# Patient Record
Sex: Female | Born: 1935 | Race: White | Hispanic: No | Smoking: Never smoker
Health system: Southern US, Community
[De-identification: ages and names within clinical notes are randomized; demographics above are authoritative.]

## PROBLEM LIST (undated history)

## (undated) DIAGNOSIS — E871 Hypo-osmolality and hyponatremia: Secondary | ICD-10-CM

## (undated) DIAGNOSIS — F418 Other specified anxiety disorders: Secondary | ICD-10-CM

## (undated) DIAGNOSIS — Z9981 Dependence on supplemental oxygen: Secondary | ICD-10-CM

## (undated) DIAGNOSIS — Z7189 Other specified counseling: Secondary | ICD-10-CM

## (undated) DIAGNOSIS — F419 Anxiety disorder, unspecified: Secondary | ICD-10-CM

## (undated) DIAGNOSIS — R4182 Altered mental status, unspecified: Secondary | ICD-10-CM

## (undated) DIAGNOSIS — J90 Pleural effusion, not elsewhere classified: Secondary | ICD-10-CM

## (undated) DIAGNOSIS — J9690 Respiratory failure, unspecified, unspecified whether with hypoxia or hypercapnia: Secondary | ICD-10-CM

## (undated) DIAGNOSIS — I340 Nonrheumatic mitral (valve) insufficiency: Secondary | ICD-10-CM

## (undated) DIAGNOSIS — E039 Hypothyroidism, unspecified: Secondary | ICD-10-CM

## (undated) DIAGNOSIS — I4819 Other persistent atrial fibrillation: Secondary | ICD-10-CM

## (undated) DIAGNOSIS — I10 Essential (primary) hypertension: Secondary | ICD-10-CM

## (undated) DIAGNOSIS — Z952 Presence of prosthetic heart valve: Secondary | ICD-10-CM

## (undated) DIAGNOSIS — D519 Vitamin B12 deficiency anemia, unspecified: Secondary | ICD-10-CM

## (undated) DIAGNOSIS — Z515 Encounter for palliative care: Secondary | ICD-10-CM

## (undated) DIAGNOSIS — E8770 Fluid overload, unspecified: Secondary | ICD-10-CM

## (undated) DIAGNOSIS — I5042 Chronic combined systolic (congestive) and diastolic (congestive) heart failure: Secondary | ICD-10-CM

## (undated) DIAGNOSIS — R627 Adult failure to thrive: Secondary | ICD-10-CM

## (undated) DIAGNOSIS — N183 Chronic kidney disease, stage 3 unspecified: Secondary | ICD-10-CM

## (undated) DIAGNOSIS — M1990 Unspecified osteoarthritis, unspecified site: Secondary | ICD-10-CM

## (undated) DIAGNOSIS — E079 Disorder of thyroid, unspecified: Secondary | ICD-10-CM

## (undated) DIAGNOSIS — I509 Heart failure, unspecified: Secondary | ICD-10-CM

## (undated) HISTORY — DX: Hypothyroidism, unspecified: E03.9

## (undated) HISTORY — DX: Pleural effusion, not elsewhere classified: J90

## (undated) HISTORY — PX: AORTIC VALVE REPLACEMENT: SHX41

## (undated) HISTORY — DX: Fluid overload, unspecified: E87.70

## (undated) HISTORY — DX: Presence of prosthetic heart valve: Z95.2

## (undated) HISTORY — DX: Hypo-osmolality and hyponatremia: E87.1

## (undated) HISTORY — DX: Nonrheumatic mitral (valve) insufficiency: I34.0

## (undated) HISTORY — DX: Chronic combined systolic (congestive) and diastolic (congestive) heart failure: I50.42

## (undated) HISTORY — DX: Respiratory failure, unspecified, unspecified whether with hypoxia or hypercapnia: J96.90

## (undated) HISTORY — DX: Chronic kidney disease, stage 3 unspecified: N18.30

## (undated) HISTORY — DX: Vitamin B12 deficiency anemia, unspecified: D51.9

## (undated) HISTORY — DX: Essential (primary) hypertension: I10

## (undated) HISTORY — PX: ABDOMINAL HYSTERECTOMY: SHX81

## (undated) HISTORY — DX: Heart failure, unspecified: I50.9

## (undated) HISTORY — DX: Other persistent atrial fibrillation: I48.19

## (undated) HISTORY — DX: Adult failure to thrive: R62.7

## (undated) HISTORY — DX: Other specified counseling: Z71.89

## (undated) HISTORY — DX: Dependence on supplemental oxygen: Z99.81

## (undated) HISTORY — DX: Unspecified osteoarthritis, unspecified site: M19.90

## (undated) HISTORY — DX: Other specified anxiety disorders: F41.8

## (undated) HISTORY — DX: Altered mental status, unspecified: R41.82

## (undated) HISTORY — DX: Encounter for palliative care: Z51.5

## (undated) HISTORY — DX: Anxiety disorder, unspecified: F41.9

---

## 2018-11-27 DIAGNOSIS — I7100 Dissection of unspecified site of aorta: Secondary | ICD-10-CM

## 2018-11-27 HISTORY — DX: Dissection of unspecified site of aorta: I71.00

## 2018-12-20 ENCOUNTER — Other Ambulatory Visit: Payer: Self-pay

## 2018-12-20 ENCOUNTER — Inpatient Hospital Stay (HOSPITAL_COMMUNITY)
Admission: EM | Admit: 2018-12-20 | Discharge: 2019-01-17 | DRG: 219 | Disposition: A | Discharge status: Home Health | Payer: Medicare Other | Attending: Cardiothoracic Surgery | Admitting: Cardiothoracic Surgery

## 2018-12-20 ENCOUNTER — Emergency Department (HOSPITAL_COMMUNITY): Payer: Medicare Other

## 2018-12-20 ENCOUNTER — Ambulatory Visit (HOSPITAL_COMMUNITY): Admit: 2018-12-20 | Payer: Self-pay | Admitting: Cardiovascular Disease

## 2018-12-20 ENCOUNTER — Encounter (HOSPITAL_COMMUNITY)
Admission: EM | Disposition: A | Discharge status: Home Health | Payer: Self-pay | Source: Home / Self Care | Attending: Cardiothoracic Surgery

## 2018-12-20 ENCOUNTER — Encounter (HOSPITAL_COMMUNITY): Payer: Self-pay

## 2018-12-20 ENCOUNTER — Emergency Department (HOSPITAL_COMMUNITY): Payer: Medicare Other | Admitting: Anesthesiology

## 2018-12-20 DIAGNOSIS — E873 Alkalosis: Secondary | ICD-10-CM | POA: Diagnosis not present

## 2018-12-20 DIAGNOSIS — E876 Hypokalemia: Secondary | ICD-10-CM | POA: Diagnosis not present

## 2018-12-20 DIAGNOSIS — R0602 Shortness of breath: Secondary | ICD-10-CM

## 2018-12-20 DIAGNOSIS — I7101 Dissection of ascending aorta: Secondary | ICD-10-CM | POA: Diagnosis present

## 2018-12-20 DIAGNOSIS — R6510 Systemic inflammatory response syndrome (SIRS) of non-infectious origin without acute organ dysfunction: Secondary | ICD-10-CM | POA: Diagnosis present

## 2018-12-20 DIAGNOSIS — I313 Pericardial effusion (noninflammatory): Secondary | ICD-10-CM | POA: Diagnosis present

## 2018-12-20 DIAGNOSIS — Z20828 Contact with and (suspected) exposure to other viral communicable diseases: Secondary | ICD-10-CM | POA: Diagnosis present

## 2018-12-20 DIAGNOSIS — D72829 Elevated white blood cell count, unspecified: Secondary | ICD-10-CM | POA: Diagnosis present

## 2018-12-20 DIAGNOSIS — R4182 Altered mental status, unspecified: Secondary | ICD-10-CM

## 2018-12-20 DIAGNOSIS — E039 Hypothyroidism, unspecified: Secondary | ICD-10-CM | POA: Diagnosis present

## 2018-12-20 DIAGNOSIS — E871 Hypo-osmolality and hyponatremia: Secondary | ICD-10-CM

## 2018-12-20 DIAGNOSIS — J9601 Acute respiratory failure with hypoxia: Secondary | ICD-10-CM | POA: Diagnosis not present

## 2018-12-20 DIAGNOSIS — Z515 Encounter for palliative care: Secondary | ICD-10-CM

## 2018-12-20 DIAGNOSIS — Z7189 Other specified counseling: Secondary | ICD-10-CM

## 2018-12-20 DIAGNOSIS — I083 Combined rheumatic disorders of mitral, aortic and tricuspid valves: Secondary | ICD-10-CM | POA: Diagnosis present

## 2018-12-20 DIAGNOSIS — I4819 Other persistent atrial fibrillation: Secondary | ICD-10-CM

## 2018-12-20 DIAGNOSIS — F419 Anxiety disorder, unspecified: Secondary | ICD-10-CM | POA: Diagnosis present

## 2018-12-20 DIAGNOSIS — Z8679 Personal history of other diseases of the circulatory system: Secondary | ICD-10-CM

## 2018-12-20 DIAGNOSIS — T502X5A Adverse effect of carbonic-anhydrase inhibitors, benzothiadiazides and other diuretics, initial encounter: Secondary | ICD-10-CM | POA: Diagnosis not present

## 2018-12-20 DIAGNOSIS — I7100 Dissection of unspecified site of aorta: Secondary | ICD-10-CM

## 2018-12-20 DIAGNOSIS — E8770 Fluid overload, unspecified: Secondary | ICD-10-CM

## 2018-12-20 DIAGNOSIS — T462X5A Adverse effect of other antidysrhythmic drugs, initial encounter: Secondary | ICD-10-CM | POA: Diagnosis not present

## 2018-12-20 DIAGNOSIS — K5900 Constipation, unspecified: Secondary | ICD-10-CM | POA: Diagnosis not present

## 2018-12-20 DIAGNOSIS — F329 Major depressive disorder, single episode, unspecified: Secondary | ICD-10-CM | POA: Diagnosis present

## 2018-12-20 DIAGNOSIS — Z66 Do not resuscitate: Secondary | ICD-10-CM | POA: Diagnosis not present

## 2018-12-20 DIAGNOSIS — G4733 Obstructive sleep apnea (adult) (pediatric): Secondary | ICD-10-CM | POA: Diagnosis present

## 2018-12-20 DIAGNOSIS — Z6838 Body mass index (BMI) 38.0-38.9, adult: Secondary | ICD-10-CM

## 2018-12-20 DIAGNOSIS — Z90710 Acquired absence of both cervix and uterus: Secondary | ICD-10-CM

## 2018-12-20 DIAGNOSIS — F05 Delirium due to known physiological condition: Secondary | ICD-10-CM | POA: Diagnosis not present

## 2018-12-20 DIAGNOSIS — I959 Hypotension, unspecified: Secondary | ICD-10-CM | POA: Diagnosis not present

## 2018-12-20 DIAGNOSIS — I4891 Unspecified atrial fibrillation: Secondary | ICD-10-CM | POA: Diagnosis present

## 2018-12-20 DIAGNOSIS — I314 Cardiac tamponade: Secondary | ICD-10-CM | POA: Diagnosis present

## 2018-12-20 DIAGNOSIS — J9602 Acute respiratory failure with hypercapnia: Secondary | ICD-10-CM | POA: Diagnosis not present

## 2018-12-20 DIAGNOSIS — Z8249 Family history of ischemic heart disease and other diseases of the circulatory system: Secondary | ICD-10-CM

## 2018-12-20 DIAGNOSIS — I454 Nonspecific intraventricular block: Secondary | ICD-10-CM | POA: Diagnosis present

## 2018-12-20 DIAGNOSIS — Z79890 Hormone replacement therapy (postmenopausal): Secondary | ICD-10-CM

## 2018-12-20 DIAGNOSIS — G4700 Insomnia, unspecified: Secondary | ICD-10-CM | POA: Diagnosis present

## 2018-12-20 DIAGNOSIS — D62 Acute posthemorrhagic anemia: Secondary | ICD-10-CM | POA: Diagnosis not present

## 2018-12-20 DIAGNOSIS — R946 Abnormal results of thyroid function studies: Secondary | ICD-10-CM | POA: Diagnosis not present

## 2018-12-20 DIAGNOSIS — J9690 Respiratory failure, unspecified, unspecified whether with hypoxia or hypercapnia: Secondary | ICD-10-CM

## 2018-12-20 DIAGNOSIS — J90 Pleural effusion, not elsewhere classified: Secondary | ICD-10-CM

## 2018-12-20 DIAGNOSIS — I71019 Dissection of thoracic aorta, unspecified: Secondary | ICD-10-CM

## 2018-12-20 DIAGNOSIS — E11649 Type 2 diabetes mellitus with hypoglycemia without coma: Secondary | ICD-10-CM | POA: Diagnosis not present

## 2018-12-20 DIAGNOSIS — Z9889 Other specified postprocedural states: Secondary | ICD-10-CM

## 2018-12-20 HISTORY — DX: Disorder of thyroid, unspecified: E07.9

## 2018-12-20 HISTORY — PX: STERNAL CLOSURE: SHX6203

## 2018-12-20 HISTORY — PX: REPLACEMENT ASCENDING AORTA: SHX6068

## 2018-12-20 LAB — BASIC METABOLIC PANEL
Anion gap: 17 — ABNORMAL HIGH (ref 5–15)
BUN: 13 mg/dL (ref 8–23)
CO2: 17 mmol/L — ABNORMAL LOW (ref 22–32)
Calcium: 9.2 mg/dL (ref 8.9–10.3)
Chloride: 99 mmol/L (ref 98–111)
Creatinine, Ser: 1.04 mg/dL — ABNORMAL HIGH (ref 0.44–1.00)
GFR calc Af Amer: 58 mL/min — ABNORMAL LOW (ref >60)
GFR calc non Af Amer: 50 mL/min — ABNORMAL LOW (ref >60)
Glucose, Bld: 288 mg/dL — ABNORMAL HIGH (ref 70–99)
Potassium: 4.2 mmol/L (ref 3.5–5.1)
Sodium: 133 mmol/L — ABNORMAL LOW (ref 135–145)

## 2018-12-20 LAB — CBC
HCT: 30.8 % — ABNORMAL LOW (ref 36.0–46.0)
Hemoglobin: 8.8 g/dL — ABNORMAL LOW (ref 12.0–15.0)
MCH: 21.5 pg — ABNORMAL LOW (ref 26.0–34.0)
MCHC: 28.6 g/dL — ABNORMAL LOW (ref 30.0–36.0)
MCV: 75.1 fL — ABNORMAL LOW (ref 80.0–100.0)
Platelets: 479 10*3/uL — ABNORMAL HIGH (ref 150–400)
RBC: 4.1 MIL/uL (ref 3.87–5.11)
RDW: 19.8 % — ABNORMAL HIGH (ref 11.5–15.5)
WBC: 13.2 10*3/uL — ABNORMAL HIGH (ref 4.0–10.5)
nRBC: 0 % (ref 0.0–0.2)

## 2018-12-20 LAB — I-STAT CHEM 8, ED
BUN: 15 mg/dL (ref 8–23)
Calcium, Ion: 1.27 mmol/L (ref 1.15–1.40)
Chloride: 99 mmol/L (ref 98–111)
Creatinine, Ser: 1 mg/dL (ref 0.44–1.00)
Glucose, Bld: 260 mg/dL — ABNORMAL HIGH (ref 70–99)
HCT: 28 % — ABNORMAL LOW (ref 36.0–46.0)
Hemoglobin: 9.5 g/dL — ABNORMAL LOW (ref 12.0–15.0)
Potassium: 4.4 mmol/L (ref 3.5–5.1)
Sodium: 131 mmol/L — ABNORMAL LOW (ref 135–145)
TCO2: 21 mmol/L — ABNORMAL LOW (ref 22–32)

## 2018-12-20 LAB — TROPONIN I (HIGH SENSITIVITY)
Troponin I (High Sensitivity): 518 ng/L (ref <18)
Troponin I (High Sensitivity): 492 ng/L (ref <18)

## 2018-12-20 LAB — BRAIN NATRIURETIC PEPTIDE: B Natriuretic Peptide: 233.8 pg/mL — ABNORMAL HIGH (ref 0.0–100.0)

## 2018-12-20 LAB — TSH: TSH: 1.293 u[IU]/mL (ref 0.350–4.500)

## 2018-12-20 LAB — PREPARE RBC (CROSSMATCH)

## 2018-12-20 LAB — PROTIME-INR
INR: 1.3 — ABNORMAL HIGH (ref 0.8–1.2)
Prothrombin Time: 16.2 seconds — ABNORMAL HIGH (ref 11.4–15.2)

## 2018-12-20 LAB — CBG MONITORING, ED: Glucose-Capillary: 262 mg/dL — ABNORMAL HIGH (ref 70–99)

## 2018-12-20 LAB — ABO/RH: ABO/RH(D): O POS

## 2018-12-20 LAB — D-DIMER, QUANTITATIVE: D-Dimer, Quant: 3.42 ug/mL-FEU — ABNORMAL HIGH (ref 0.00–0.50)

## 2018-12-20 LAB — SARS CORONAVIRUS 2 BY RT PCR (HOSPITAL ORDER, PERFORMED IN ~~LOC~~ HOSPITAL LAB): SARS Coronavirus 2: NEGATIVE

## 2018-12-20 SURGERY — REPLACEMENT, AORTA, ASCENDING
Anesthesia: General

## 2018-12-20 SURGERY — LEFT HEART CATH AND CORONARY ANGIOGRAPHY
Anesthesia: LOCAL

## 2018-12-20 MED ORDER — HEPARIN (PORCINE) IN NACL 1000-0.9 UT/500ML-% IV SOLN
INTRAVENOUS | Status: AC
  Filled 2018-12-20: qty 1000

## 2018-12-20 MED ORDER — EPINEPHRINE 1 MG/10ML IJ SOSY
PREFILLED_SYRINGE | INTRAMUSCULAR | Status: DC | PRN
  Administered 2018-12-20 (×3): .2 mg via INTRAVENOUS

## 2018-12-20 MED ORDER — HEPARIN SODIUM (PORCINE) 1000 UNIT/ML IJ SOLN
INTRAMUSCULAR | Status: AC
  Filled 2018-12-20: qty 1

## 2018-12-20 MED ORDER — SODIUM CHLORIDE 0.9 % IV BOLUS
500.0000 mL | Freq: Once | INTRAVENOUS | Status: AC
  Administered 2018-12-20: 500 mL via INTRAVENOUS

## 2018-12-20 MED ORDER — PLASMA-LYTE 148 IV SOLN
INTRAVENOUS | Status: AC
  Filled 2018-12-20: qty 2.5

## 2018-12-20 MED ORDER — ARTIFICIAL TEARS OPHTHALMIC OINT
TOPICAL_OINTMENT | OPHTHALMIC | Status: DC | PRN
  Administered 2018-12-20: 1 via OPHTHALMIC

## 2018-12-20 MED ORDER — IOHEXOL 350 MG/ML SOLN
100.0000 mL | Freq: Once | INTRAVENOUS | Status: AC | PRN
  Administered 2018-12-20: 100 mL via INTRAVENOUS

## 2018-12-20 MED ORDER — TRANEXAMIC ACID (OHS) BOLUS VIA INFUSION
15.0000 mg/kg | INTRAVENOUS | Status: AC
  Administered 2018-12-20: 23:00:00 1293 mg via INTRAVENOUS
  Filled 2018-12-20: qty 1293

## 2018-12-20 MED ORDER — SUCCINYLCHOLINE CHLORIDE 20 MG/ML IJ SOLN
INTRAMUSCULAR | Status: DC | PRN
  Administered 2018-12-20: 100 mg via INTRAVENOUS

## 2018-12-20 MED ORDER — NITROGLYCERIN IN D5W 200-5 MCG/ML-% IV SOLN: 2.0000 ug/min | INTRAVENOUS | Status: AC

## 2018-12-20 MED ORDER — FENTANYL CITRATE (PF) 100 MCG/2ML IJ SOLN
INTRAMUSCULAR | Status: DC | PRN
  Administered 2018-12-20 (×2): 50 ug via INTRAVENOUS
  Administered 2018-12-20: 250 ug via INTRAVENOUS
  Administered 2018-12-21 (×3): 50 ug via INTRAVENOUS

## 2018-12-20 MED ORDER — INSULIN REGULAR(HUMAN) IN NACL 100-0.9 UT/100ML-% IV SOLN
INTRAVENOUS | Status: AC
  Administered 2018-12-20: 2.7 [IU]/h via INTRAVENOUS
  Filled 2018-12-20: qty 100

## 2018-12-20 MED ORDER — FENTANYL CITRATE (PF) 250 MCG/5ML IJ SOLN
INTRAMUSCULAR | Status: AC
  Filled 2018-12-20: qty 10

## 2018-12-20 MED ORDER — POTASSIUM CHLORIDE 2 MEQ/ML IV SOLN
80.0000 meq | INTRAVENOUS | Status: AC
  Filled 2018-12-20: qty 40

## 2018-12-20 MED ORDER — SODIUM CHLORIDE 0.9 % IV SOLN
10.0000 mL/h | Freq: Once | INTRAVENOUS | Status: AC
  Administered 2018-12-20: 10 mL/h via INTRAVENOUS

## 2018-12-20 MED ORDER — SODIUM CHLORIDE 0.9 % IV SOLN
INTRAVENOUS | Status: AC
  Filled 2018-12-20: qty 30

## 2018-12-20 MED ORDER — SODIUM CHLORIDE 0.9 % IV SOLN
1.5000 g | INTRAVENOUS | Status: AC
  Administered 2018-12-20: 1.5 g via INTRAVENOUS
  Filled 2018-12-20: qty 1.5

## 2018-12-20 MED ORDER — DEXMEDETOMIDINE HCL IN NACL 400 MCG/100ML IV SOLN
0.1000 ug/kg/h | INTRAVENOUS | Status: AC
  Administered 2018-12-20: .2 ug/kg/h via INTRAVENOUS
  Filled 2018-12-20: qty 100

## 2018-12-20 MED ORDER — SODIUM CHLORIDE 0.9 % IV SOLN
INTRAVENOUS | Status: DC | PRN
  Administered 2018-12-20: 50 ug/min via INTRAVENOUS

## 2018-12-20 MED ORDER — LACTATED RINGERS IV BOLUS: 500.0000 mL | Freq: Once | INTRAVENOUS | Status: DC

## 2018-12-20 MED ORDER — MIDAZOLAM HCL 5 MG/5ML IJ SOLN
INTRAMUSCULAR | Status: DC | PRN
  Administered 2018-12-20: 1 mg via INTRAVENOUS
  Administered 2018-12-20: 2 mg via INTRAVENOUS
  Administered 2018-12-20: 5 mg via INTRAVENOUS
  Administered 2018-12-20 – 2018-12-21 (×2): 2 mg via INTRAVENOUS

## 2018-12-20 MED ORDER — SODIUM CHLORIDE 0.9 % IV SOLN
10.0000 mL/h | Freq: Once | INTRAVENOUS | Status: AC
  Administered 2018-12-21: 04:00:00 via INTRAVENOUS

## 2018-12-20 MED ORDER — NOREPINEPHRINE 4 MG/250ML-% IV SOLN
INTRAVENOUS | Status: DC | PRN
  Administered 2018-12-20: 10 ug/min via INTRAVENOUS

## 2018-12-20 MED ORDER — TRANEXAMIC ACID 1000 MG/10ML IV SOLN
1.5000 mg/kg/h | INTRAVENOUS | Status: AC
  Administered 2018-12-20: 1.5 mg/kg/h via INTRAVENOUS
  Filled 2018-12-20: qty 25

## 2018-12-20 MED ORDER — DOPAMINE-DEXTROSE 3.2-5 MG/ML-% IV SOLN: 0.0000 ug/kg/min | INTRAVENOUS | Status: AC

## 2018-12-20 MED ORDER — HEPARIN SODIUM (PORCINE) 1000 UNIT/ML IJ SOLN
INTRAMUSCULAR | Status: DC | PRN
  Administered 2018-12-20: 26000 [IU] via INTRAVENOUS
  Administered 2018-12-20: 5000 [IU] via INTRAVENOUS

## 2018-12-20 MED ORDER — MILRINONE LACTATE IN DEXTROSE 20-5 MG/100ML-% IV SOLN
0.3000 ug/kg/min | INTRAVENOUS | Status: AC
  Filled 2018-12-20: qty 100

## 2018-12-20 MED ORDER — LIDOCAINE HCL (PF) 1 % IJ SOLN
INTRAMUSCULAR | Status: AC
  Filled 2018-12-20: qty 30

## 2018-12-20 MED ORDER — ETOMIDATE 2 MG/ML IV SOLN
INTRAVENOUS | Status: DC | PRN
  Administered 2018-12-20: 6 mg via INTRAVENOUS

## 2018-12-20 MED ORDER — MAGNESIUM SULFATE 50 % IJ SOLN
40.0000 meq | INTRAMUSCULAR | Status: AC
  Filled 2018-12-20: qty 9.85

## 2018-12-20 MED ORDER — VANCOMYCIN HCL 10 G IV SOLR
1250.0000 mg | INTRAVENOUS | Status: AC
  Administered 2018-12-20: 1250 mg via INTRAVENOUS
  Filled 2018-12-20: qty 1250

## 2018-12-20 MED ORDER — VERAPAMIL HCL 2.5 MG/ML IV SOLN
INTRAVENOUS | Status: AC
  Filled 2018-12-20: qty 2

## 2018-12-20 MED ORDER — PHENYLEPHRINE HCL-NACL 20-0.9 MG/250ML-% IV SOLN
30.0000 ug/min | INTRAVENOUS | Status: AC
  Filled 2018-12-20: qty 250

## 2018-12-20 MED ORDER — EPINEPHRINE HCL 5 MG/250ML IV SOLN IN NS
0.0000 ug/min | INTRAVENOUS | Status: AC
  Filled 2018-12-20: qty 250

## 2018-12-20 MED ORDER — SODIUM CHLORIDE 0.9 % IV SOLN
750.0000 mg | INTRAVENOUS | Status: AC
  Administered 2018-12-21: 05:00:00 750 mg via INTRAVENOUS
  Filled 2018-12-20: qty 750

## 2018-12-20 MED ORDER — MANNITOL 20 % IV SOLN
Freq: Once | INTRAVENOUS | Status: DC
  Filled 2018-12-20: qty 13

## 2018-12-20 MED ORDER — VASOPRESSIN 20 UNIT/ML IV SOLN
INTRAVENOUS | Status: DC | PRN
  Administered 2018-12-20: 5 [IU] via INTRAVENOUS
  Administered 2018-12-20 (×5): 2 [IU] via INTRAVENOUS
  Administered 2018-12-20: 5 [IU] via INTRAVENOUS

## 2018-12-20 MED ORDER — LORAZEPAM 2 MG/ML IJ SOLN
1.0000 mg | Freq: Once | INTRAMUSCULAR | Status: DC
  Filled 2018-12-20: qty 1

## 2018-12-20 MED ORDER — ASPIRIN 81 MG PO CHEW: 324.0000 mg | CHEWABLE_TABLET | Freq: Once | ORAL | Status: DC

## 2018-12-20 MED ORDER — NITROGLYCERIN 1 MG/10 ML FOR IR/CATH LAB
INTRA_ARTERIAL | Status: AC
  Filled 2018-12-20: qty 10

## 2018-12-20 MED ORDER — MIDAZOLAM HCL (PF) 10 MG/2ML IJ SOLN
INTRAMUSCULAR | Status: AC
  Filled 2018-12-20: qty 2

## 2018-12-20 MED ORDER — 0.9 % SODIUM CHLORIDE (POUR BTL) OPTIME
TOPICAL | Status: DC | PRN
  Administered 2018-12-20: 5000 mL

## 2018-12-20 MED ORDER — ROCURONIUM BROMIDE 50 MG/5ML IV SOSY
PREFILLED_SYRINGE | INTRAVENOUS | Status: DC | PRN
  Administered 2018-12-20: 50 mg via INTRAVENOUS
  Administered 2018-12-20: 20 mg via INTRAVENOUS
  Administered 2018-12-20: 30 mg via INTRAVENOUS
  Administered 2018-12-21: 20 mg via INTRAVENOUS
  Administered 2018-12-21: 30 mg via INTRAVENOUS

## 2018-12-20 MED ORDER — TRANEXAMIC ACID (OHS) PUMP PRIME SOLUTION
2.0000 mg/kg | INTRAVENOUS | Status: AC
  Filled 2018-12-20: qty 1.72

## 2018-12-20 MED ORDER — LACTATED RINGERS IV SOLN
INTRAVENOUS | Status: DC | PRN
  Administered 2018-12-20 – 2018-12-21 (×3): via INTRAVENOUS

## 2018-12-20 SURGICAL SUPPLY — 102 items
ADAPTER CARDIO PERF ANTE/RETRO (ADAPTER) ×3 IMPLANT
ADH SKN CLS APL DERMABOND .7 (GAUZE/BANDAGES/DRESSINGS) ×2
ADPR PRFSN 84XANTGRD RTRGD (ADAPTER) ×2
BAG DECANTER FOR FLEXI CONT (MISCELLANEOUS) ×3 IMPLANT
BANDAGE HEMOSTAT MRDH 4X4 STRL (MISCELLANEOUS) IMPLANT
BATTERY MAXDRIVER (MISCELLANEOUS) ×1 IMPLANT
BLADE STERNUM SYSTEM 6 (BLADE) ×3 IMPLANT
BNDG HEMOSTAT MRDH 4X4 STRL (MISCELLANEOUS) ×3
CANISTER SUCT 3000ML PPV (MISCELLANEOUS) ×3 IMPLANT
CANNULA SUMP PERICARDIAL (CANNULA) ×1 IMPLANT
CATH CPB KIT HENDRICKSON (MISCELLANEOUS) ×3 IMPLANT
CATH HEART VENT LEFT (CATHETERS) IMPLANT
CATH RETROPLEGIA CORONARY 14FR (CATHETERS) ×1 IMPLANT
CATH ROBINSON RED A/P 18FR (CATHETERS) ×6 IMPLANT
CAUTERY SURG HI TEMP FINE TIP (MISCELLANEOUS) ×1 IMPLANT
CONN ST 1/4X3/8  BEN (MISCELLANEOUS) ×4
CONN ST 1/4X3/8 BEN (MISCELLANEOUS) IMPLANT
DERMABOND ADVANCED (GAUZE/BANDAGES/DRESSINGS) ×1
DERMABOND ADVANCED .7 DNX12 (GAUZE/BANDAGES/DRESSINGS) ×2 IMPLANT
DRAIN CHANNEL 28F RND 3/8 FF (WOUND CARE) ×9 IMPLANT
DRAPE CARDIOVASCULAR INCISE (DRAPES) ×3
DRAPE SLUSH/WARMER DISC (DRAPES) ×3 IMPLANT
DRAPE SRG 135X102X78XABS (DRAPES) ×2 IMPLANT
DRSG AQUACEL AG ADV 3.5X 6 (GAUZE/BANDAGES/DRESSINGS) ×1 IMPLANT
DRSG AQUACEL AG ADV 3.5X14 (GAUZE/BANDAGES/DRESSINGS) ×3 IMPLANT
ELECT REM PT RETURN 9FT ADLT (ELECTROSURGICAL) ×6
ELECTRODE REM PT RTRN 9FT ADLT (ELECTROSURGICAL) ×4 IMPLANT
FELT TEFLON 1X6 (MISCELLANEOUS) ×6 IMPLANT
FEMORAL VENOUS CANN RAP (CANNULA) ×1 IMPLANT
GAUZE SPONGE 4X4 12PLY STRL (GAUZE/BANDAGES/DRESSINGS) ×5 IMPLANT
GLOVE NEODERM STRL 7.5 LF PF (GLOVE) ×6 IMPLANT
GLOVE SURG NEODERM 7.5  LF PF (GLOVE) ×3
GOWN STRL REUS W/ TWL LRG LVL3 (GOWN DISPOSABLE) ×8 IMPLANT
GOWN STRL REUS W/TWL LRG LVL3 (GOWN DISPOSABLE) ×24
GRAFT CV 30X8WVN NDL (Graft) IMPLANT
GRAFT HEMASHIELD 8MM (Graft) ×3 IMPLANT
GRAFT WOVEN D/V 28DX30L (Vascular Products) ×1 IMPLANT
GRAFT WOVEN D/V 32DX30L (Vascular Products) ×1 IMPLANT
HEMOSTAT POWDER SURGIFOAM 1G (HEMOSTASIS) ×9 IMPLANT
HEMOSTAT SURGICEL 2X14 (HEMOSTASIS) ×3 IMPLANT
INSERT FOGARTY XLG (MISCELLANEOUS) ×1 IMPLANT
IV NS 1000ML (IV SOLUTION) ×3
IV NS 1000ML BAXH (IV SOLUTION) IMPLANT
KIT BASIN OR (CUSTOM PROCEDURE TRAY) ×3 IMPLANT
KIT DILATOR VASC 18G NDL (KITS) ×1 IMPLANT
KIT DRAINAGE VACCUM ASSIST (KITS) ×1 IMPLANT
KIT SUCTION CATH 14FR (SUCTIONS) ×3 IMPLANT
KIT TURNOVER KIT B (KITS) ×3 IMPLANT
LEAD PACING MYOCARDI (MISCELLANEOUS) ×3 IMPLANT
LINE VENT (MISCELLANEOUS) ×1 IMPLANT
NDL HYPO 18GX1.5 BLUNT FILL (NEEDLE) IMPLANT
NEEDLE HYPO 18GX1.5 BLUNT FILL (NEEDLE) ×3 IMPLANT
NS IRRIG 1000ML POUR BTL (IV SOLUTION) ×15 IMPLANT
PACK E OPEN HEART (SUTURE) ×3 IMPLANT
PACK OPEN HEART (CUSTOM PROCEDURE TRAY) ×3 IMPLANT
PACK SPY-PHI (KITS) IMPLANT
PAD ELECT DEFIB RADIOL ZOLL (MISCELLANEOUS) ×3 IMPLANT
PLATE STERNAL 2.3X208 14H 2-PK (Plate) ×1 IMPLANT
POSITIONER HEAD DONUT 9IN (MISCELLANEOUS) ×3 IMPLANT
POWDER SURGICEL 3.0 GRAM (HEMOSTASIS) ×1 IMPLANT
SCREW BONE LOCKING 2.3X9 (Screw) ×6 IMPLANT
SCREW LOCKING TI 2.3X11MM (Screw) ×3 IMPLANT
SCREW LOCKING TI 2.3X13MM (Screw) ×3 IMPLANT
SEALANT SURG COSEAL 8ML (VASCULAR PRODUCTS) ×1 IMPLANT
SET CARDIOPLEGIA MPS 5001102 (MISCELLANEOUS) ×1 IMPLANT
SET VEIN GRAFT PERF (SET/KITS/TRAYS/PACK) ×1 IMPLANT
SHEATH PINNACLE 8F 10CM (SHEATH) ×1 IMPLANT
SPONGE LAP 4X18 RFD (DISPOSABLE) ×1 IMPLANT
STAPLER VISISTAT 35W (STAPLE) ×2 IMPLANT
SUT BONE WAX W31G (SUTURE) ×3 IMPLANT
SUT MNCRL AB 3-0 PS2 18 (SUTURE) ×6 IMPLANT
SUT PDS AB 1 CTX 36 (SUTURE) ×6 IMPLANT
SUT PROLENE 2 0 MH 48 (SUTURE) ×2 IMPLANT
SUT PROLENE 3 0 SH DA (SUTURE) ×4 IMPLANT
SUT PROLENE 3 0 SH1 36 (SUTURE) ×8 IMPLANT
SUT PROLENE 4 0 SH DA (SUTURE) ×2 IMPLANT
SUT PROLENE 5 0 C 1 36 (SUTURE) IMPLANT
SUT PROLENE 8 0 BV175 6 (SUTURE) IMPLANT
SUT PROLENE BLUE 7 0 (SUTURE) ×3 IMPLANT
SUT SILK  1 MH (SUTURE) ×2
SUT SILK 1 MH (SUTURE) IMPLANT
SUT SILK 2 0 SH CR/8 (SUTURE) IMPLANT
SUT SILK 3 0 SH CR/8 (SUTURE) IMPLANT
SUT STEEL 6MS V (SUTURE) ×3 IMPLANT
SUT STEEL SZ 6 DBL 3X14 BALL (SUTURE) ×3 IMPLANT
SUT VIC AB 1 CTX 36 (SUTURE) ×6
SUT VIC AB 1 CTX36XBRD ANBCTR (SUTURE) IMPLANT
SUT VIC AB 2-0 CTX 27 (SUTURE) IMPLANT
SUT VIC AB 3-0 X1 27 (SUTURE) IMPLANT
SYR 10ML LL (SYRINGE) IMPLANT
SYR 20ML LL LF (SYRINGE) ×1 IMPLANT
SYSTEM SAHARA CHEST DRAIN ATS (WOUND CARE) ×3 IMPLANT
TAPE CLOTH SOFT 2X10 (GAUZE/BANDAGES/DRESSINGS) ×1 IMPLANT
TAPE CLOTH SURG 4X10 WHT LF (GAUZE/BANDAGES/DRESSINGS) ×1 IMPLANT
TOWEL GREEN STERILE (TOWEL DISPOSABLE) ×3 IMPLANT
TOWEL GREEN STERILE FF (TOWEL DISPOSABLE) ×3 IMPLANT
TRAY FOLEY SLVR 16FR TEMP STAT (SET/KITS/TRAYS/PACK) ×2 IMPLANT
UNDERPAD 30X30 (UNDERPADS AND DIAPERS) ×3 IMPLANT
VENT LEFT HEART 12002 (CATHETERS) ×3
WATER STERILE IRR 1000ML POUR (IV SOLUTION) ×6 IMPLANT
WATER STERILE IRR 1000ML UROMA (IV SOLUTION) IMPLANT
WIRE EMERALD 3MM-J .035X150CM (WIRE) IMPLANT

## 2018-12-20 NOTE — ED Triage Notes (Signed)
Patient arrives via [facility] due to having ongoing pain between her shoulder blades since Monday and new onset shortness of breath that started today. Patient arrives anxious but reports no chest pain. tachypneic as well.   Patient given 324mg  of asprin and NTG 0.4mg  x2 by ems.

## 2018-12-20 NOTE — ED Provider Notes (Signed)
[facility]   CSN: [id number] Arrival date & time: 12/20/18  1708     History   Chief Complaint Chief Complaint  Patient presents with  . Chest Pain  . Shortness of Breath    HPI Kaitlyn Rodriguez is a 85 y.o. female.      Chest Pain Pain location:  Substernal area Pain quality: pressure   Pain radiates to:  Does not radiate Pain severity:  Mild Onset quality:  Sudden Duration:  8 hours Timing:  Constant Progression:  Waxing and waning Chronicity:  New Context: stress   Relieved by:  None tried Worsened by:  Nothing Ineffective treatments:  None tried Associated symptoms: shortness of breath   Associated symptoms: no abdominal pain, no altered mental status, no back pain, no cough, no fever, no lower extremity edema, no near-syncope, no numbness, no palpitations, no syncope, no vomiting and no weakness   Risk factors: no aortic disease, no coronary artery disease, no diabetes mellitus, no hypertension, no immobilization and no prior DVT/PE   Shortness of Breath Associated symptoms: chest pain   Associated symptoms: no abdominal pain, no cough, no ear pain, no fever, no rash, no sore throat, no syncope and no vomiting   Patient reports that she had run some errands this morning and afterwards she became SOB at home. Sudden onset.  No hx of similar symptoms.  Past Medical History:  Diagnosis Date  . Thyroid disease     There are no active problems to display for this patient.   Past Surgical History:  Procedure Laterality Date  . ABDOMINAL HYSTERECTOMY     at age 85     OB History   No obstetric history on file.      Home Medications    Prior to Admission medications   Medication Sig Start Date End Date Taking? Authorizing Provider  levothyroxine (SYNTHROID) 150 MCG tablet Take 150 mcg by mouth daily before breakfast.   Yes [provider]    Family History History reviewed. No pertinent  family history.  Social History Social History   Tobacco Use  . Smoking status: Never Smoker  . Smokeless tobacco: Never Used  Substance Use Topics  . Alcohol use: Yes  . Drug use: Never     Allergies   Patient has no known allergies.   Review of Systems Review of Systems  Constitutional: Negative for chills and fever.  HENT: Negative for ear pain and sore throat.   Eyes: Negative for pain and visual disturbance.  Respiratory: Positive for shortness of breath. Negative for cough.   Cardiovascular: Positive for chest pain. Negative for palpitations, syncope and near-syncope.  Gastrointestinal: Negative for abdominal pain and vomiting.  Genitourinary: Negative for dysuria and hematuria.  Musculoskeletal: Negative for arthralgias and back pain.  Skin: Negative for color change and rash.  Neurological: Negative for seizures, syncope, weakness and numbness.  All other systems reviewed and are negative.    Physical Exam Updated Vital Signs BP 91/69   Pulse 98   Temp 97.9 F (36.6 C) (Oral)   Resp (!) 25   Ht 5\' 7"  (1.702 m)   Wt 86.2 kg   SpO2 92%   BMI 29.76 kg/m   Physical Exam Vitals signs and nursing note reviewed.  Constitutional:      General: She is in acute distress.     Appearance: She is well-developed. She is obese. She is ill-appearing.  HENT:     Head:  Normocephalic and atraumatic.  Eyes:     Conjunctiva/sclera: Conjunctivae normal.  Neck:     Musculoskeletal: Neck supple.  Cardiovascular:     Rate and Rhythm: Regular rhythm. Tachycardia present.     Pulses:          Carotid pulses are 2+ on the right side and 2+ on the left side.      Radial pulses are 2+ on the right side and 2+ on the left side.       Dorsalis pedis pulses are 2+ on the right side and 2+ on the left side.       Posterior tibial pulses are 2+ on the right side and 2+ on the left side.     Heart sounds: No murmur.     Comments: Muffled heart sounds Pulmonary:     Effort:  Pulmonary effort is normal. No respiratory distress.     Breath sounds: Normal breath sounds. No decreased breath sounds.  Chest:     Chest wall: No tenderness or crepitus.  Abdominal:     General: There is no abdominal bruit.     Palpations: Abdomen is soft.     Tenderness: There is no abdominal tenderness.  Musculoskeletal: Normal range of motion.  Skin:    General: Skin is warm and dry.     Capillary Refill: Capillary refill takes 2 to 3 seconds.  Neurological:     General: No focal deficit present.     Mental Status: She is alert.  Psychiatric:        Mood and Affect: Mood is anxious.      ED Treatments / Results  Labs (all labs ordered are listed, but only abnormal results are displayed) Labs Reviewed  BASIC METABOLIC PANEL - Abnormal; Notable for the following components:      Result Value   Sodium 133 (*)    CO2 17 (*)    Glucose, Bld 288 (*)    Creatinine, Ser 1.04 (*)    GFR calc non Af Amer 50 (*)    GFR calc Af Amer 58 (*)    Anion gap 17 (*)    All other components within normal limits  CBC - Abnormal; Notable for the following components:   WBC 13.2 (*)    Hemoglobin 8.8 (*)    HCT 30.8 (*)    MCV 75.1 (*)    MCH 21.5 (*)    MCHC 28.6 (*)    RDW 19.8 (*)    Platelets 479 (*)    All other components within normal limits  D-DIMER, QUANTITATIVE (NOT AT [facility]) - Abnormal; Notable for the following components:   D-Dimer, Quant 3.42 (*)    All other components within normal limits  BRAIN NATRIURETIC PEPTIDE - Abnormal; Notable for the following components:   B Natriuretic Peptide 233.8 (*)    All other components within normal limits  PROTIME-INR - Abnormal; Notable for the following components:   Prothrombin Time 16.2 (*)    INR 1.3 (*)    All other components within normal limits  CBG MONITORING, ED - Abnormal; Notable for the following components:   Glucose-Capillary 262 (*)    All other components within normal limits  I-STAT CHEM 8, ED - Abnormal;  Notable for the following components:   Sodium 131 (*)    Glucose, Bld 260 (*)    TCO2 21 (*)    Hemoglobin 9.5 (*)    HCT 28.0 (*)    All other components within  normal limits  TROPONIN I (HIGH SENSITIVITY) - Abnormal; Notable for the following components:   Troponin I (High Sensitivity) 518 (*)    All other components within normal limits  TROPONIN I (HIGH SENSITIVITY) - Abnormal; Notable for the following components:   Troponin I (High Sensitivity) 492 (*)    All other components within normal limits  SARS CORONAVIRUS 2 BY RT PCR (HOSPITAL ORDER, PERFORMED IN [facility])  TSH  TYPE AND SCREEN  ABO/RH  PREPARE RBC (CROSSMATCH)  PREPARE RBC (CROSSMATCH)  PREPARE FRESH FROZEN PLASMA    EKG EKG Interpretation  Date/Time:  Saturday December 20 2018 17:11:21 EDT Ventricular Rate:  134 PR Interval:    QRS Duration: 106 QT Interval:  277 QTC Calculation: 414 R Axis:   -51 Text Interpretation:  Sinus tachycardia Inferior infarct, acute (LCx) Probable anteroseptal infarct, old Lateral leads are also involved Will repeat, poor baseline Confirmed by Blane Ohara [phone]) on 12/20/2018 5:26:53 PM   Radiology Dg Chest Portable 1 View  Result Date: 12/20/2018 CLINICAL DATA:  Chest pain EXAM: PORTABLE CHEST 1 VIEW COMPARISON:  None. FINDINGS: There is mild cardiomegaly. Mildly increased interstitial markings seen within the left mid lung and left lower lung. Streaky atelectasis seen at the right lung base. No pleural effusion. No acute osseous abnormality. IMPRESSION: Nonspecific increased interstitial markings at the left lower lung. This could be due to atelectasis, chronic lung changes, and/or early infectious etiology. Electronically Signed   By: Jonna Clark M.D.   On: 12/20/2018 19:17   Ct Angio Chest/abd/pel For Dissection W And/or Wo Contrast  Result Date: 12/20/2018 CLINICAL DATA:  85 year old female with ongoing chest pain radiating to her shoulder blade. New  onset of shortness of breath. EXAM: CT ANGIOGRAPHY CHEST, ABDOMEN AND PELVIS TECHNIQUE: Multidetector CT imaging through the chest, abdomen and pelvis was performed using the standard protocol during bolus administration of intravenous contrast. Multiplanar reconstructed images and MIPs were obtained and reviewed to evaluate the vascular anatomy. CONTRAST:  OMNIPAQUE IOHEXOL 350 MG/ML SOLN COMPARISON:  Chest radiograph dated 12/20/2018. FINDINGS: CTA CHEST FINDINGS Cardiovascular: There is no cardiomegaly. There is a large pericardial effusion measuring up to 3.7 cm in thickness anterior to the heart. There is associated slight mass effect and flattening of the anterior wall of the right ventricle. Higher attenuation of the pericardial fluid most consistent with hemopericardium. They are is a crescentic high attenuating density starting from the aortic root and extending to the proximal aortic arch most consistent with intramural hematoma. This appears to extend into the brachiocephalic trunk. The visualized origins of the great vessels of the aortic arch otherwise remain patent. There is a penetrating ulcer or a pseudoaneurysm along the medial wall of the ascending aorta measuring approximately 1 cm in depth and 3 cm in length (coronal series 11, image 67 and axial series 8, image 50). There is a focal area of heterogeneous density and opacification involving the proximal descending thoracic aorta (series 8, image 80 and coronal series 11 images 107-115). This may represent an area of intimal injury, or developing clot. No definite extraluminal contrast identified. Mediastinum/Nodes: There is no hilar or mediastinal adenopathy. The esophagus and the thyroid gland are grossly unremarkable. Lungs/Pleura: Trace bilateral pleural effusions. There are bibasilar subsegmental atelectasis as well as diffuse linear and hazy densities throughout the lungs most consistent with atelectatic changes or areas of air  trapping. No consolidative changes. No pneumothorax. The central airways are patent. Musculoskeletal: There is degenerative changes of the  spine. Multilevel disc desiccation and vacuum phenomena. No acute osseous pathology. Review of the MIP images confirms the above findings. CTA ABDOMEN AND PELVIS FINDINGS VASCULAR Aorta: Mild atherosclerotic calcification. No aneurysmal dilatation or dissection. Celiac: Patent without evidence of aneurysm, dissection, vasculitis or significant stenosis. SMA: Patent without evidence of aneurysm, dissection, vasculitis or significant stenosis. Renals: Both renal arteries are patent without evidence of aneurysm, dissection, vasculitis, fibromuscular dysplasia or significant stenosis. IMA: Patent without evidence of aneurysm, dissection, vasculitis or significant stenosis. Inflow: Patent without evidence of aneurysm, dissection, vasculitis or significant stenosis. Veins: No obvious venous abnormality within the limitations of this arterial phase study. Review of the MIP images confirms the above findings. NON-VASCULAR No intra-abdominal free air or free fluid. Hepatobiliary: The liver and gallbladder appear unremarkable. Pancreas: Unremarkable. No pancreatic ductal dilatation or surrounding inflammatory changes. Spleen: Normal in size without focal abnormality. Adrenals/Urinary Tract: The adrenal glands are unremarkable. Minimal fullness of the left renal collecting systems versus parapelvic cyst. The right kidney is unremarkable. The visualized ureters appear unremarkable. The urinary bladder is collapsed. Stomach/Bowel: There is no bowel obstruction or active inflammation. The appendix is not visualized with certainty. No inflammatory changes identified in the right lower quadrant. Lymphatic: No adenopathy. Reproductive: Hysterectomy. No pelvic mass. Other: None Musculoskeletal: Osteopenia with multilevel degenerative changes of the spine. Multilevel disc desiccation and vacuum  phenomena. No acute osseous pathology. Review of the MIP images confirms the above findings. IMPRESSION: 1. Findings most consistent with active intramural hemorrhage involving the aortic root, ascending aorta and proximal aortic arch with a large hemopericardium. Clinical correlation is recommended to evaluate for cardiac tamponade. 2. Focal penetrating ulcer or pseudoaneurysm of the medial wall of the distal ascending aorta. 3. Focal heterogeneity of intraluminal contrast in the proximal descending thoracic aorta may represent underlying intimal injury. 4. No acute intra-abdominal or pelvic pathology. These results were called by telephone at the time of interpretation on 12/20/2018 at 8:00 pm to provider Burt Knack, who verbally acknowledged these results. Electronically Signed   By: Anner Crete M.D.   On: 12/20/2018 20:36    Procedures Procedures (including critical care time)  Medications Ordered in ED Medications  lactated ringers bolus 500 mL ( Intravenous MAR Hold 12/20/18 2334)  0.9 %  sodium chloride infusion ( Intravenous MAR Hold 12/20/18 2334)  [facility] Blood Cardioplegia vial (lidocaine/magnesium/mannitol 0.26g-4g-6.4g) ( Intracoronary MAR Hold 12/20/18 2334)  0.9 % irrigation (POUR BTL) (5,000 mLs Irrigation Given 12/20/18 2343)  heparin 2,500 Units, papaverine 30 mg in electrolyte-148 (PLASMALYTE-148) 500 mL irrigation (500 mLs Intravascular Given 12/21/18 0038)  Surgifoam 1 Gm with 0.9% sodium chloride (4 ml) topical solution (4 mLs Topical Given 12/21/18 0039)  hemostatic agents (1 application Topical Given 12/21/18 0040)  sodium chloride 0.9 % bolus 500 mL (0 mLs Intravenous Stopped 12/20/18 2008)  iohexol (OMNIPAQUE) 350 MG/ML injection 100 mL (100 mLs Intravenous Contrast Given 12/20/18 1939)  0.9 %  sodium chloride infusion (10 mL/hr Intravenous New Bag/Given 12/20/18 2129)  verapamil (ISOPTIN) 2.5 MG/ML injection (has no administration in time range)  heparin 1000 UNIT/ML  injection (has no administration in time range)  nitroGLYCERIN 100 mcg/mL intra-arterial injection (has no administration in time range)  lidocaine (PF) (XYLOCAINE) 1 % injection (has no administration in time range)  Heparin (Porcine) in NaCl 1000-0.9 UT/500ML-% SOLN (has no administration in time range)  midazolam PF (VERSED) 10 MG/2ML injection (has no administration in time range)  fentaNYL (SUBLIMAZE) 250 MCG/5ML injection (has no administration in time range)  Initial Impression / Assessment and Plan / ED Course  I have reviewed the triage vital signs and the nursing notes.  Pertinent labs & imaging results that were available during my care of the patient were reviewed by me and considered in my medical decision making (see chart for details).        Patient is an 85 year old female with history and physical exam as above presents to the emergency department for evaluation of sudden onset dyspnea beginning approximately 9 AM this morning.  She states that she has had no significant past medical history and denies taking medications.  Chest x-ray demonstrated enlarged cardiac silhouette.  Labs were ordered including CBC, metabolic panel, D-dimer, troponins, TSH.  CT scan of the chest abdomen pelvis to evaluate for aortic dissection was performed due to findings of pericardial effusion on point-of-care ultrasound.  CTA demonstrated findings concerning for hemopericardium with active extravasation.  Cardiology as well as cardiothoracic surgery were consulted to evaluate the patient.  Please see their notes for further details their assessment and plan.  Briefly findings are very concerning for type a aortic dissection.  Patient is to be taken to the OR emergently from the emergency department.  She was given emergency release blood for hemoglobin just over 8 due to her active bleeding.  Blood pressure remained borderline in the emergency department. Patient to OR in critical condition.   Patient was seen in conjunction with my attending physician Dr. 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

## 2018-12-20 NOTE — Progress Notes (Signed)
Interventional Cardiology/STEMI Note: Called earlier regarding Kaitlyn Rodriguez due to concern about possible STEMI.  The patient is 85 years old and has been feeling poorly since Monday of this week.  She primarily complains of shortness of breath but also has experienced upper back discomfort.  Today she developed worsening of chest and back discomfort prompting a call to EMS.  Her EKG shows sinus tachycardia with diffuse ST segment elevation.  There are no reciprocal changes.  Cardiology fellow, Dr. Marletta Lor, evaluated the patient in the emergency room.  After reviewing her case, we are concerned about the possibility of pericardial tamponade after her initial chest x-ray shows an enlarged cardiac silhouette.  A bedside echo was performed with Dr. Marletta Lor and I both present and there is a moderate to large pericardial effusion present.  Her clinical picture is worrisome with chest and back pain and pericardial effusion with diffuse ST elevation.  Certainly acute aortic dissection is in the differential.  She will be sent for a stat CTA of the chest to evaluate for dissection.  Further acute care pending these results.  Discussed findings and plan with both the patient and her son who is at the bedside.  Sherren Mocha 12/20/2018 7:28 PM

## 2018-12-20 NOTE — ED Notes (Signed)
Kaitlyn Rodriguez (son): [phone]  Salem Caster (Brother): [phone]  CALL THEM WITH ANY UPDATES.

## 2018-12-20 NOTE — Anesthesia Procedure Notes (Signed)
Procedure Name: Intubation Date/Time: 12/20/2018 10:37 PM Performed by: Suzy Bouchard, CRNA Pre-anesthesia Checklist: Patient identified, Emergency Drugs available, Suction available, Patient being monitored and Timeout performed Patient Re-evaluated:Patient Re-evaluated prior to induction Oxygen Delivery Method: Circle system utilized Induction Type: IV induction and Rapid sequence Laryngoscope Size: Glidescope and 3 Grade View: Grade I Tube type: Oral Tube size: 8.0 mm Number of attempts: 1 Airway Equipment and Method: Stylet and Video-laryngoscopy Placement Confirmation: ETT inserted through vocal cords under direct vision,  positive ETCO2 and breath sounds checked- equal and bilateral Secured at: 23 cm Tube secured with: Tape Dental Injury: Teeth and Oropharynx as per pre-operative assessment

## 2018-12-20 NOTE — Anesthesia Preprocedure Evaluation (Signed)
Anesthesia Evaluation  Patient identified by MRN, date of birth, ID band Patient awake  Preop documentation limited or incomplete due to emergent nature of procedure.  Airway Mallampati: II       Dental  (+) Dental Advisory Given   Pulmonary    breath sounds clear to auscultation       Cardiovascular  Rhythm:Regular Rate:Normal  Acute aortic dissection with tamponade   Neuro/Psych    GI/Hepatic   Endo/Other  Hypothyroidism   Renal/GU      Musculoskeletal   Abdominal   Peds  Hematology   Anesthesia Other Findings   Reproductive/Obstetrics                             Lab Results  Component Value Date   WBC 13.2 (H) 12/20/2018   HGB 9.5 (L) 12/20/2018   HCT 28.0 (L) 12/20/2018   MCV 75.1 (L) 12/20/2018   PLT 479 (H) 12/20/2018   Lab Results  Component Value Date   CREATININE 1.00 12/20/2018   BUN 15 12/20/2018   NA 131 (L) 12/20/2018   K 4.4 12/20/2018   CL 99 12/20/2018   CO2 17 (L) 12/20/2018   Lab Results  Component Value Date   INR 1.3 (H) 12/20/2018    Anesthesia Physical Anesthesia Plan  ASA: V  Anesthesia Plan: General   Post-op Pain Management:    Induction: Intravenous  PONV Risk Score and Plan: 3 and Treatment may vary due to age or medical condition  Airway Management Planned: Oral ETT  Additional Equipment: Arterial line, CVP, PA Cath, TEE and Ultrasound Guidance Line Placement  Intra-op Plan:   Post-operative Plan: Post-operative intubation/ventilation  Informed Consent: I have reviewed the patients History and Physical, chart, labs and discussed the procedure including the risks, benefits and alternatives for the proposed anesthesia with the patient or authorized representative who has indicated his/her understanding and acceptance.       Plan Discussed with: CRNA  Anesthesia Plan Comments:         Anesthesia Quick Evaluation

## 2018-12-20 NOTE — Consult Note (Signed)
[address]       [place] [address]             [phone]        IEISHA GAO [facility] Medical Record #[id number] Date of Birth: 1934/02/09  Referring: No ref. provider found Primary Care: Patient, No Pcp Per Primary Cardiologist:No primary care provider on file.  Chief Complaint:    Chief Complaint  Patient presents with  . Chest Pain  . Shortness of Breath    History of Present Illness:     85 yo lady in good health until this past week when she had fatigue and intermittent dyspnea. She thought she might have a cold or allergies. Did well until this pm when she had dramatically worse sx, prompting ER visit, where she was hypotensive and tachycardiac. 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 Consult received for repair. Now comfortable and hemodynamically stable.    Current Activity/ Functional Status: Patient is independent with mobility/ambulation, transfers, ADL's, IADL's.   Zubrod Score: At the time of surgery this patient's most appropriate activity status/level should be described as: []     0    Normal activity, no symptoms []     1    Restricted in physical strenuous activity but ambulatory, able to do out light work []     2    Ambulatory and capable of self care, unable to do work activities, up and about                 more than 50%  Of the time                            []     3    Only limited self care, in bed greater than 50% of waking hours []     4    Completely disabled, no self care, confined to bed or chair []     5    Moribund  Past Medical History:  Diagnosis Date  . Thyroid disease     Past Surgical History:  Procedure Laterality Date  . ABDOMINAL HYSTERECTOMY     at age 76    Social History   Tobacco Use  Smoking Status Never Smoker  Smokeless Tobacco Never Used    Social History   Substance and Sexual Activity  Alcohol Use Yes     No Known Allergies  Current  Facility-Administered Medications  Medication Dose Route Frequency Provider Last Rate Last Dose  . lactated ringers bolus 500 mL  500 mL Intravenous Once , MD       Current Outpatient Medications  Medication Sig Dispense Refill  . levothyroxine (SYNTHROID) 150 MCG tablet Take 150 mcg by mouth daily before breakfast.      (Not in a hospital admission)   History reviewed. No pertinent family history.   Review of Systems:   ROS A comprehensive review of systems was negative except for: Endocrine: positive for hypothyroidism     Cardiac Review of Systems: Y or  [    ]= no  Chest Pain [    ]  Resting SOB [   ] Exertional SOB  [  ]  Orthopnea [  ]   Pedal Edema [   ]    Palpitations [  ] Syncope  [  ]   Presyncope [   ]  General Review of Systems: [Y] =  yes [  ]=no Constitional: recent weight change [  ]; anorexia [  ]; fatigue [  ]; nausea [  ]; night sweats [  ]; fever [  ]; or chills [  ]                                                               Dental: Last Dentist visit:   Eye : blurred vision [  ]; diplopia [   ]; vision changes [  ];  Amaurosis fugax[  ]; Resp: cough [  ];  wheezing[  ];  hemoptysis[  ]; shortness of breath[  ]; paroxysmal nocturnal dyspnea[  ]; dyspnea on exertion[  ]; or orthopnea[  ];  GI:  gallstones[  ], vomiting[  ];  dysphagia[  ]; melena[  ];  hematochezia [  ]; heartburn[  ];   Hx of  Colonoscopy[  ]; GU: kidney stones [  ]; hematuria[  ];   dysuria [  ];  nocturia[  ];  history of     obstruction [  ]; urinary frequency [  ]             Skin: rash, swelling[  ];, hair loss[  ];  peripheral edema[  ];  or itching[  ]; Musculosketetal: myalgias[  ];  joint swelling[  ];  joint erythema[  ];  joint pain[  ];  back pain[  ];  Heme/Lymph: bruising[  ];  bleeding[  ];  anemia[  ];  Neuro: TIA[  ];  headaches[  ];  stroke[  ];  vertigo[  ];  seizures[  ];   paresthesias[  ];  difficulty walking[  ];  Psych:depression[  ]; anxiety[  ];   Endocrine: diabetes[  ];  thyroid dysfunction[  ];            Physical Exam: BP 94/71 (BP Location: Right Arm)   Pulse 98   Temp 97.7 F (36.5 C) (Oral)   Resp (!) 24   Ht 5\' 7"  (1.702 m)   Wt 86.2 kg   SpO2 96%   BMI 29.76 kg/m    General appearance: alert, cooperative and no distress Head: Normocephalic, without obvious abnormality, atraumatic Neck: no adenopathy, no carotid bruit, no JVD, supple, symmetrical, trachea midline and thyroid not enlarged, symmetric, no tenderness/mass/nodules Resp: clear to auscultation bilaterally Cardio: regular rate and rhythm, S1, S2 normal, no murmur, click, rub or gallop GI: soft, non-tender; bowel sounds normal; no masses,  no organomegaly Extremities: extremities normal, atraumatic, no cyanosis or edema Neurologic: Alert and oriented X 3, normal strength and tone. Normal symmetric reflexes. Normal coordination and gait  Diagnostic Studies & Laboratory data:     Recent Radiology Findings:   Dg Chest Portable 1 View  Result Date: 12/20/2018 CLINICAL DATA:  Chest pain EXAM: PORTABLE CHEST 1 VIEW COMPARISON:  None. FINDINGS: There is mild cardiomegaly. Mildly increased interstitial markings seen within the left mid lung and left lower lung. Streaky atelectasis seen at the right lung base. No pleural effusion. No acute osseous abnormality. IMPRESSION: Nonspecific increased interstitial markings at the left lower lung. This could be due to atelectasis, chronic lung changes, and/or early infectious etiology. Electronically Signed   By: Jonna ClarkBindu  Avutu M.D.   On: 12/20/2018 19:17  Ct Angio Chest/abd/pel For Dissection W And/or Wo Contrast  Result Date: 12/20/2018 CLINICAL DATA:  85 year old female with ongoing chest pain radiating to her shoulder blade. New onset of shortness of breath. EXAM: CT ANGIOGRAPHY CHEST, ABDOMEN AND PELVIS TECHNIQUE: Multidetector CT imaging through the chest, abdomen and pelvis was performed using the standard protocol  during bolus administration of intravenous contrast. Multiplanar reconstructed images and MIPs were obtained and reviewed to evaluate the vascular anatomy. CONTRAST:  OMNIPAQUE IOHEXOL 350 MG/ML SOLN COMPARISON:  Chest radiograph dated 12/20/2018. FINDINGS: CTA CHEST FINDINGS Cardiovascular: There is no cardiomegaly. There is a large pericardial effusion measuring up to 3.7 cm in thickness anterior to the heart. There is associated slight mass effect and flattening of the anterior wall of the right ventricle. Higher attenuation of the pericardial fluid most consistent with hemopericardium. They are is a crescentic high attenuating density starting from the aortic root and extending to the proximal aortic arch most consistent with intramural hematoma. This appears to extend into the brachiocephalic trunk. The visualized origins of the great vessels of the aortic arch otherwise remain patent. There is a penetrating ulcer or a pseudoaneurysm along the medial wall of the ascending aorta measuring approximately 1 cm in depth and 3 cm in length (coronal series 11, image 67 and axial series 8, image 50). There is a focal area of heterogeneous density and opacification involving the proximal descending thoracic aorta (series 8, image 80 and coronal series 11 images 107-115). This may represent an area of intimal injury, or developing clot. No definite extraluminal contrast identified. Mediastinum/Nodes: There is no hilar or mediastinal adenopathy. The esophagus and the thyroid gland are grossly unremarkable. Lungs/Pleura: Trace bilateral pleural effusions. There are bibasilar subsegmental atelectasis as well as diffuse linear and hazy densities throughout the lungs most consistent with atelectatic changes or areas of air trapping. No consolidative changes. No pneumothorax. The central airways are patent. Musculoskeletal: There is degenerative changes of the spine. Multilevel disc desiccation and vacuum phenomena. No  acute osseous pathology. Review of the MIP images confirms the above findings. CTA ABDOMEN AND PELVIS FINDINGS VASCULAR Aorta: Mild atherosclerotic calcification. No aneurysmal dilatation or dissection. Celiac: Patent without evidence of aneurysm, dissection, vasculitis or significant stenosis. SMA: Patent without evidence of aneurysm, dissection, vasculitis or significant stenosis. Renals: Both renal arteries are patent without evidence of aneurysm, dissection, vasculitis, fibromuscular dysplasia or significant stenosis. IMA: Patent without evidence of aneurysm, dissection, vasculitis or significant stenosis. Inflow: Patent without evidence of aneurysm, dissection, vasculitis or significant stenosis. Veins: No obvious venous abnormality within the limitations of this arterial phase study. Review of the MIP images confirms the above findings. NON-VASCULAR No intra-abdominal free air or free fluid. Hepatobiliary: The liver and gallbladder appear unremarkable. Pancreas: Unremarkable. No pancreatic ductal dilatation or surrounding inflammatory changes. Spleen: Normal in size without focal abnormality. Adrenals/Urinary Tract: The adrenal glands are unremarkable. Minimal fullness of the left renal collecting systems versus parapelvic cyst. The right kidney is unremarkable. The visualized ureters appear unremarkable. The urinary bladder is collapsed. Stomach/Bowel: There is no bowel obstruction or active inflammation. The appendix is not visualized with certainty. No inflammatory changes identified in the right lower quadrant. Lymphatic: No adenopathy. Reproductive: Hysterectomy. No pelvic mass. Other: None Musculoskeletal: Osteopenia with multilevel degenerative changes of the spine. Multilevel disc desiccation and vacuum phenomena. No acute osseous pathology. Review of the MIP images confirms the above findings. IMPRESSION: 1. Findings most consistent with active intramural hemorrhage involving the aortic root,  ascending aorta and proximal  aortic arch with a large hemopericardium. Clinical correlation is recommended to evaluate for cardiac tamponade. 2. Focal penetrating ulcer or pseudoaneurysm of the medial wall of the distal ascending aorta. 3. Focal heterogeneity of intraluminal contrast in the proximal descending thoracic aorta may represent underlying intimal injury. 4. No acute intra-abdominal or pelvic pathology. These results were called by telephone at the time of interpretation on 12/20/2018 at 8:00 pm to provider Burt Knack, who verbally acknowledged these results. Electronically Signed   By: Anner Crete M.D.   On: 12/20/2018 20:36     I have independently reviewed the above radiologic studies and discussed with the patient   Recent Lab Findings: Lab Results  Component Value Date   WBC 13.2 (H) 12/20/2018   HGB 9.5 (L) 12/20/2018   HCT 28.0 (L) 12/20/2018   PLT 479 (H) 12/20/2018   GLUCOSE 260 (H) 12/20/2018   NA 131 (L) 12/20/2018   K 4.4 12/20/2018   CL 99 12/20/2018   CREATININE 1.00 12/20/2018   BUN 15 12/20/2018   CO2 17 (L) 12/20/2018   TSH 1.293 12/20/2018   INR 1.3 (H) 12/20/2018      Assessment / Plan:     85 yo lady with pending aortic catastrophe. She is accompanied by her son, and they are counseled as to diagnosis and need for urgent surgery in order to ensure best outcome. They understand and wish to proceed immediately.      I  spent 30 minutes counseling the patient face to face.   Myer Bohlman Z. Orvan Seen, [place] 12/20/2018 8:53 PM

## 2018-12-20 NOTE — ED Provider Notes (Signed)
ATTENDING SUPERVISORY NOTE I have personally viewed the imaging studies performed. I have personally seen and examined the patient, and discussed the plan of care with the resident.  I have reviewed the documentation of the resident and agree.  No diagnosis found.  Patient presents with new onset dyspnea and diaphoresis.  Patient given aspirin on route.  On exam patient is leaning leaning forward, tachypnea, tachycardia 130s.  Troponin mild elevation.  Initial concern for possible STEMI with dyspnea being cardiac equivalent and mild ST elevation diffuse.  Discussed with cardiology on-call Dr. Burt Knack and we both agree to look for other causes such as pulmonary embolism/dissection.  Chest x-ray revealed significant cardiomegaly and pleural effusion.  Cardiology assisted with performing bedside ultrasound which showed significant pericardial effusion.  Patient is IVC dilated on our repeat ultrasound.  Discussed with CT scan technician for emergent CT angiogram to look for dissection.  Multiple discussions with cardiology.  .Critical Care Performed by: Elnora Morrison, MD Authorized by: Elnora Morrison, MD   Critical care provider statement:    Critical care time (minutes):  75   Critical care start time:  12/20/2018 8:00 PM   Critical care end time:  12/20/2018 9:15 PM   Critical care time was exclusive of:  Separately billable procedures and treating other patients and teaching time   Critical care was necessary to treat or prevent imminent or life-threatening deterioration of the following conditions:  Cardiac failure   Critical care was time spent personally by me on the following activities:  Evaluation of patient's response to treatment, examination of patient, ordering and performing treatments and interventions, ordering and review of laboratory studies, ordering and review of radiographic studies, pulse oximetry, re-evaluation of patient's condition, obtaining history from patient or surrogate,  review of old charts and discussions with consultants Ultrasound ED Echo  Date/Time: 12/20/2018 9:18 PM Performed by: Elnora Morrison, MD Authorized by: Elnora Morrison, MD   Procedure details:    Indications: dyspnea     Views: subxiphoid, parasternal long axis view, parasternal short axis view, apical 4 chamber view and IVC view     Images: archived     Limitations:  Body habitus Findings:    Pericardium: large pericardial effusion     LV Function: depressed (30 - 50%)     IVC: dilated   Impression:    Impression: pericardial effusion present        Elnora Morrison, MD 12/22/18 [phone]

## 2018-12-20 NOTE — ED Notes (Signed)
Date and time results received: 12/20/18  (use smartphrase ".now" to insert current time)  Test: troponin Critical Value: 518  Name of Provider Notified: MD, Re[facility] [employer]  Orders Received? Or Actions Taken?: awaiting new orders

## 2018-12-20 NOTE — Progress Notes (Signed)
Chaplain responded to code STEMI. Chaplain not needed at this time. Chaplain remains available for support per request.  Chaplain Resident, Evelene Croon, [facility] Pager # [phone] On-call

## 2018-12-20 NOTE — ED Notes (Signed)
Pt to CT

## 2018-12-20 NOTE — H&P (Addendum)
Cardiology Admission History and Physical:   Patient ID: Kaitlyn Rodriguez MRN: [id number]; DOB: [birth year]   Admission date: 12/20/2018  Primary Care Provider: Patient, No Pcp Per Primary Cardiologist: No primary care provider on file.  Primary Electrophysiologist:  None   Chief Complaint:  Shortness of breath  Patient Profile:   Kaitlyn Rodriguez is a 85 y.o. female with PMH only of thyroid disease who presents with sudden onset shoulder pain and shortness of breath.   History of Present Illness:   [name] was in her usual state of good health until this week, when she noticed gradual onset of intermittent dyspnea. She initially attributed this to sinus congestion and trialed appropriate over the counter therapies with no relief. She also noted some intermittent intrascapular pain which was new for her.   This afternoon at 4PM, the patient returned from a trip to [facility] and noted sudden onset weakness and shortness of breath as well as markedly worsening of her intra-scapular pain. Her son, appropriately, brought her to the ED for further evaluation.  Upon arrival to the ED, patient was tachycardic to 130s and hypotensive to 99/69. SpO2 was 95% on 2L O2. Initial EKG with concern for inferolateral STEMI with STE in V4-V6, I, AVL. Cardiology susbequently consulted.   Upon my arrival to ER, patient in distress, ashen tachyneic. CXR being performed at the bedside and demonstrated marked widening of her mediastinum with enlarged cardiac silhouette. An urgent bedside POCUS was performed and notable for a larger circumferential pericardial effusion with a marked dilated IVC concerning for tamponade physiology.  Follow up CTA of the aorta with concern for Type A dissection.    Past Medical History:  Diagnosis Date  . Thyroid disease     Past Surgical History:  Procedure Laterality Date  . ABDOMINAL HYSTERECTOMY     at age 37     Medications Prior to Admission: Prior to Admission  medications   Not on File     Allergies:   No Known Allergies  Social History:   Social History   Socioeconomic History  . Marital status: Unknown    Spouse name: Not on file  . Number of children: Not on file  . Years of education: Not on file  . Highest education level: Not on file  Occupational History  . Not on file  Social Needs  . Financial resource strain: Not on file  . Food insecurity    Worry: Not on file    Inability: Not on file  . Transportation needs    Medical: Not on file    Non-medical: Not on file  Tobacco Use  . Smoking status: Never Smoker  . Smokeless tobacco: Never Used  Substance and Sexual Activity  . Alcohol use: Yes  . Drug use: Never  . Sexual activity: Not on file  Lifestyle  . Physical activity    Days per week: Not on file    Minutes per session: Not on file  . Stress: Not on file  Relationships  . Social Musician on phone: Not on file    Gets together: Not on file    Attends religious service: Not on file    Active member of club or organization: Not on file    Attends meetings of clubs or organizations: Not on file    Relationship status: Not on file  . Intimate partner violence    Fear of current or ex partner: Not on file  Emotionally abused: Not on file    Physically abused: Not on file    Forced sexual activity: Not on file  Other Topics Concern  . Not on file  Social History Narrative  . Not on file    Family History:   The patient's family history is not on file.  Father with MI in his 62s.   Review of Systems: [y] = yes, [ ]  = no     General: Weight gain [ ] ; Weight loss [ ] ; Anorexia [ ] ; Fatigue [ ] ; Fever [ ] ; Chills [ ] ; Weakness [ y]   Cardiac: Chest pain/pressure Blue.Reese ]; Resting SOB Blue.Reese ]; Exertional SOB [ ] ; Orthopnea [ ] ; Pedal Edema [ ] ; Palpitations [ ] ; Syncope [ ] ; Presyncope [ ] ; Paroxysmal nocturnal dyspnea[ ]    Pulmonary: Cough [ ] ; Wheezing[ ] ; Hemoptysis[ ] ; Sputum [ ] ; Snoring [ ]     GI: Vomiting[ ] ; Dysphagia[ ] ; Melena[ ] ; Hematochezia [ ] ; Heartburn[ ] ; Abdominal pain [ ] ; Constipation [ ] ; Diarrhea [ ] ; BRBPR [ ]    GU: Hematuria[ ] ; Dysuria [ ] ; Nocturia[ ]    Vascular: Pain in legs with walking [ ] ; Pain in feet with lying flat [ ] ; Non-healing sores [ ] ; Stroke [ ] ; TIA [ ] ; Slurred speech [ ] ;   Neuro: Headaches[ ] ; Vertigo[ ] ; Seizures[ ] ; Paresthesias[ ] ;Blurred vision [ ] ; Diplopia [ ] ; Vision changes [ ]    Ortho/Skin: Arthritis [ ] ; Joint pain [ ] ; Muscle pain [ ] ; Joint swelling [ ] ; Back Pain [ ] ; Rash [ ]    Psych: Depression[ ] ; Anxiety[ ]    Heme: Bleeding problems [ ] ; Clotting disorders [ ] ; Anemia [ ]    Endocrine: Diabetes [ ] ; Thyroid dysfunction[ ]   Physical Exam/Data:   Vitals:   12/20/18 1713 12/20/18 1714  Pulse: (!) 135   Resp: (!) 30   Temp: 97.7 F (36.5 C)   TempSrc: Oral   SpO2: 92%   Weight:  86.2 kg  Height:  5\' 7"  (1.702 m)   No intake or output data in the 24 hours ending 12/20/18 1853 Filed Weights   12/20/18 1714  Weight: 86.2 kg   Body mass index is 29.76 kg/m.  General:  Well nourished, well developed, in distress.  HEENT: normal Lymph: no adenopathy Neck: JVD elevated to mid neck.  Endocrine:  No thryomegaly Vascular: No carotid bruits; FA pulses 2+ bilaterally without bruits  Cardiac:  normal S1, S2; RRR; no murmurs. Distant heart sounds.  Lungs:  clear to auscultation bilaterally, no wheezing, rhonchi or rales  Abd: soft, nontender, no hepatomegaly  Ext: no LE edema.  Musculoskeletal:  No deformities, BUE and BLE strength normal and equal Skin: warm and dry  Neuro:  CNs 2-12 intact, no focal abnormalities noted Psych:  Normal affect   EKG:  The ECG that was done and demonstrates diffuse STE primrarily in lateral leads (V4-V6, I, AVL) without reciprocal depressions.   Relevant CV Studies: CXR: Marked widening of the mediastinum with markedly enlarged cardiac silhouette.   Laboratory Data:  ChemistryNo  results for input(s): NA, K, CL, CO2, GLUCOSE, BUN, CREATININE, CALCIUM, GFRNONAA, GFRAA, ANIONGAP in the last 168 hours.  No results for input(s): PROT, ALBUMIN, AST, ALT, ALKPHOS, BILITOT in the last 168 hours. HematologyNo results for input(s): WBC, RBC, HGB, HCT, MCV, MCH, MCHC, RDW, PLT in the last 168 hours. Cardiac EnzymesNo results for input(s): TROPONINI in the last 168 hours. No results for input(s): TROPIPOC in the last 168  hours.  BNPNo results for input(s): BNP, PROBNP in the last 168 hours.  DDimer No results for input(s): DDIMER in the last 168 hours.  Radiology/Studies:  Dg Chest Portable 1 View  Result Date: 12/20/2018 CLINICAL DATA:  Chest pain EXAM: PORTABLE CHEST 1 VIEW COMPARISON:  None. FINDINGS: There is mild cardiomegaly. Mildly increased interstitial markings seen within the left mid lung and left lower lung. Streaky atelectasis seen at the right lung base. No pleural effusion. No acute osseous abnormality. IMPRESSION: Nonspecific increased interstitial markings at the left lower lung. This could be due to atelectasis, chronic lung changes, and/or early infectious etiology. Electronically Signed   By: Jonna ClarkBindu  Avutu M.D.   On: 12/20/2018 19:17    Assessment and Plan:   [name] is a 85 year old woman with a known history of only hyperthyroidism who presents with acute onset shortness of breath and back pain found to have a larger pericardial effusion. Follow up CTA for dissection with concern for Type A dissection - no frank dissection flap but with findings concerning for intramural hematoma and hemopericardium. Plan is for the OR this evening.   Cardiology will continue to follow perioperatively.   Patient seen and examined with Dr. Excell Seltzerooper.   Severity of Illness: The appropriate patient status for this patient is INPATIENT. Inpatient status is judged to be reasonable and necessary in order to provide the required intensity of service to ensure the patient's safety.  The patient's presenting symptoms, physical exam findings, and initial radiographic and laboratory data in the context of their chronic comorbidities is felt to place them at high risk for further clinical deterioration. Furthermore, it is not anticipated that the patient will be medically stable for discharge from the hospital within 2 midnights of admission. The following factors support the patient status of inpatient.   " The patient's presenting symptoms include chest pain, dyspnea. " The worrisome physical exam findings include tachypnea, tachycardia. . " The initial radiographic and laboratory data are worrisome because of aortic dissection   * I certify that at the point of admission it is my clinical judgment that the patient will require inpatient hospital care spanning beyond 2 midnights from the point of admission due to high intensity of service, high risk for further deterioration and high frequency of surveillance required.*    For questions or updates, please contact [facility] Please consult [email or web address] for contact info under    Signed, Laurell JosephsLauren K Truby, MD  12/20/2018 6:53 PM   Agree with findings as outlined by Dr Charna Busmanruby. CT study personally reviewed with radiology and with Dr Vickey SagesAtkins. Pt going to OR emergently for repair of aortic dissection.  Tonny BollmanMichael Jolette Lana 12/20/2018 9:36 PM

## 2018-12-21 ENCOUNTER — Inpatient Hospital Stay (HOSPITAL_COMMUNITY): Payer: Medicare Other

## 2018-12-21 DIAGNOSIS — I7101 Dissection of ascending aorta: Secondary | ICD-10-CM

## 2018-12-21 DIAGNOSIS — E871 Hypo-osmolality and hyponatremia: Secondary | ICD-10-CM | POA: Diagnosis not present

## 2018-12-21 DIAGNOSIS — Z66 Do not resuscitate: Secondary | ICD-10-CM | POA: Diagnosis not present

## 2018-12-21 DIAGNOSIS — E039 Hypothyroidism, unspecified: Secondary | ICD-10-CM | POA: Diagnosis present

## 2018-12-21 DIAGNOSIS — R946 Abnormal results of thyroid function studies: Secondary | ICD-10-CM | POA: Diagnosis not present

## 2018-12-21 DIAGNOSIS — I454 Nonspecific intraventricular block: Secondary | ICD-10-CM | POA: Diagnosis present

## 2018-12-21 DIAGNOSIS — F05 Delirium due to known physiological condition: Secondary | ICD-10-CM | POA: Diagnosis not present

## 2018-12-21 DIAGNOSIS — Z8679 Personal history of other diseases of the circulatory system: Secondary | ICD-10-CM

## 2018-12-21 DIAGNOSIS — I959 Hypotension, unspecified: Secondary | ICD-10-CM | POA: Diagnosis not present

## 2018-12-21 DIAGNOSIS — E873 Alkalosis: Secondary | ICD-10-CM | POA: Diagnosis not present

## 2018-12-21 DIAGNOSIS — I420 Dilated cardiomyopathy: Secondary | ICD-10-CM | POA: Diagnosis not present

## 2018-12-21 DIAGNOSIS — R6510 Systemic inflammatory response syndrome (SIRS) of non-infectious origin without acute organ dysfunction: Secondary | ICD-10-CM | POA: Diagnosis not present

## 2018-12-21 DIAGNOSIS — I340 Nonrheumatic mitral (valve) insufficiency: Secondary | ICD-10-CM | POA: Diagnosis not present

## 2018-12-21 DIAGNOSIS — R410 Disorientation, unspecified: Secondary | ICD-10-CM | POA: Diagnosis not present

## 2018-12-21 DIAGNOSIS — T502X5A Adverse effect of carbonic-anhydrase inhibitors, benzothiadiazides and other diuretics, initial encounter: Secondary | ICD-10-CM | POA: Diagnosis not present

## 2018-12-21 DIAGNOSIS — Z515 Encounter for palliative care: Secondary | ICD-10-CM | POA: Diagnosis not present

## 2018-12-21 DIAGNOSIS — D62 Acute posthemorrhagic anemia: Secondary | ICD-10-CM | POA: Diagnosis not present

## 2018-12-21 DIAGNOSIS — I4891 Unspecified atrial fibrillation: Secondary | ICD-10-CM | POA: Diagnosis present

## 2018-12-21 DIAGNOSIS — I083 Combined rheumatic disorders of mitral, aortic and tricuspid valves: Secondary | ICD-10-CM | POA: Diagnosis present

## 2018-12-21 DIAGNOSIS — I351 Nonrheumatic aortic (valve) insufficiency: Secondary | ICD-10-CM | POA: Diagnosis not present

## 2018-12-21 DIAGNOSIS — E038 Other specified hypothyroidism: Secondary | ICD-10-CM | POA: Diagnosis not present

## 2018-12-21 DIAGNOSIS — Z7189 Other specified counseling: Secondary | ICD-10-CM | POA: Diagnosis not present

## 2018-12-21 DIAGNOSIS — E8770 Fluid overload, unspecified: Secondary | ICD-10-CM | POA: Diagnosis not present

## 2018-12-21 DIAGNOSIS — J90 Pleural effusion, not elsewhere classified: Secondary | ICD-10-CM | POA: Diagnosis not present

## 2018-12-21 DIAGNOSIS — I313 Pericardial effusion (noninflammatory): Secondary | ICD-10-CM | POA: Diagnosis not present

## 2018-12-21 DIAGNOSIS — I712 Thoracic aortic aneurysm, without rupture: Secondary | ICD-10-CM | POA: Diagnosis not present

## 2018-12-21 DIAGNOSIS — I4819 Other persistent atrial fibrillation: Secondary | ICD-10-CM | POA: Diagnosis not present

## 2018-12-21 DIAGNOSIS — J9602 Acute respiratory failure with hypercapnia: Secondary | ICD-10-CM | POA: Diagnosis not present

## 2018-12-21 DIAGNOSIS — I314 Cardiac tamponade: Secondary | ICD-10-CM | POA: Diagnosis not present

## 2018-12-21 DIAGNOSIS — I361 Nonrheumatic tricuspid (valve) insufficiency: Secondary | ICD-10-CM | POA: Diagnosis not present

## 2018-12-21 DIAGNOSIS — R400 Somnolence: Secondary | ICD-10-CM | POA: Diagnosis not present

## 2018-12-21 DIAGNOSIS — E11649 Type 2 diabetes mellitus with hypoglycemia without coma: Secondary | ICD-10-CM | POA: Diagnosis not present

## 2018-12-21 DIAGNOSIS — G4733 Obstructive sleep apnea (adult) (pediatric): Secondary | ICD-10-CM | POA: Diagnosis present

## 2018-12-21 DIAGNOSIS — T462X5A Adverse effect of other antidysrhythmic drugs, initial encounter: Secondary | ICD-10-CM | POA: Diagnosis not present

## 2018-12-21 DIAGNOSIS — J9622 Acute and chronic respiratory failure with hypercapnia: Secondary | ICD-10-CM | POA: Diagnosis not present

## 2018-12-21 DIAGNOSIS — Z9889 Other specified postprocedural states: Secondary | ICD-10-CM | POA: Diagnosis not present

## 2018-12-21 DIAGNOSIS — R0602 Shortness of breath: Secondary | ICD-10-CM | POA: Diagnosis present

## 2018-12-21 DIAGNOSIS — Z20828 Contact with and (suspected) exposure to other viral communicable diseases: Secondary | ICD-10-CM | POA: Diagnosis not present

## 2018-12-21 DIAGNOSIS — D72829 Elevated white blood cell count, unspecified: Secondary | ICD-10-CM | POA: Diagnosis present

## 2018-12-21 DIAGNOSIS — R4182 Altered mental status, unspecified: Secondary | ICD-10-CM | POA: Diagnosis not present

## 2018-12-21 DIAGNOSIS — E876 Hypokalemia: Secondary | ICD-10-CM | POA: Diagnosis not present

## 2018-12-21 DIAGNOSIS — I7121 Aneurysm of the ascending aorta, without rupture: Secondary | ICD-10-CM | POA: Diagnosis present

## 2018-12-21 DIAGNOSIS — J9601 Acute respiratory failure with hypoxia: Secondary | ICD-10-CM | POA: Diagnosis not present

## 2018-12-21 HISTORY — DX: Personal history of other diseases of the circulatory system: Z86.79

## 2018-12-21 HISTORY — DX: Hypothyroidism, unspecified: E03.9

## 2018-12-21 HISTORY — DX: Dissection of ascending aorta: I71.010

## 2018-12-21 LAB — POCT I-STAT 7, (LYTES, BLD GAS, ICA,H+H)
Acid-base deficit: 3 mmol/L — ABNORMAL HIGH (ref 0.0–2.0)
Acid-base deficit: 4 mmol/L — ABNORMAL HIGH (ref 0.0–2.0)
Acid-base deficit: 6 mmol/L — ABNORMAL HIGH (ref 0.0–2.0)
Acid-base deficit: 8 mmol/L — ABNORMAL HIGH (ref 0.0–2.0)
Acid-base deficit: 1 mmol/L (ref 0.0–2.0)
Bicarbonate: 18.6 mmol/L — ABNORMAL LOW (ref 20.0–28.0)
Bicarbonate: 21.2 mmol/L (ref 20.0–28.0)
Bicarbonate: 22.2 mmol/L (ref 20.0–28.0)
Bicarbonate: 23.2 mmol/L (ref 20.0–28.0)
Bicarbonate: 27.5 mmol/L (ref 20.0–28.0)
Bicarbonate: 25.6 mmol/L (ref 20.0–28.0)
Bicarbonate: 25.8 mmol/L (ref 20.0–28.0)
Calcium, Ion: 1.01 mmol/L — ABNORMAL LOW (ref 1.15–1.40)
Calcium, Ion: 1.01 mmol/L — ABNORMAL LOW (ref 1.15–1.40)
Calcium, Ion: 1.04 mmol/L — ABNORMAL LOW (ref 1.15–1.40)
Calcium, Ion: 1.14 mmol/L — ABNORMAL LOW (ref 1.15–1.40)
Calcium, Ion: 1.09 mmol/L — ABNORMAL LOW (ref 1.15–1.40)
Calcium, Ion: 1.15 mmol/L (ref 1.15–1.40)
Calcium, Ion: 1.15 mmol/L (ref 1.15–1.40)
HCT: 22 % — ABNORMAL LOW (ref 36.0–46.0)
HCT: 25 % — ABNORMAL LOW (ref 36.0–46.0)
HCT: 25 % — ABNORMAL LOW (ref 36.0–46.0)
HCT: 26 % — ABNORMAL LOW (ref 36.0–46.0)
HCT: 32 % — ABNORMAL LOW (ref 36.0–46.0)
HCT: 26 % — ABNORMAL LOW (ref 36.0–46.0)
HCT: 25 % — ABNORMAL LOW (ref 36.0–46.0)
Hemoglobin: 7.5 g/dL — ABNORMAL LOW (ref 12.0–15.0)
Hemoglobin: 8.5 g/dL — ABNORMAL LOW (ref 12.0–15.0)
Hemoglobin: 8.5 g/dL — ABNORMAL LOW (ref 12.0–15.0)
Hemoglobin: 8.8 g/dL — ABNORMAL LOW (ref 12.0–15.0)
Hemoglobin: 10.9 g/dL — ABNORMAL LOW (ref 12.0–15.0)
Hemoglobin: 8.8 g/dL — ABNORMAL LOW (ref 12.0–15.0)
Hemoglobin: 8.5 g/dL — ABNORMAL LOW (ref 12.0–15.0)
O2 Saturation: 100 %
O2 Saturation: 100 %
O2 Saturation: 99 %
O2 Saturation: 99 %
O2 Saturation: 96 %
O2 Saturation: 94 %
O2 Saturation: 90 %
Patient temperature: 36.5
Patient temperature: 36.8
Patient temperature: 36.5
Potassium: 4.5 mmol/L (ref 3.5–5.1)
Potassium: 4.9 mmol/L (ref 3.5–5.1)
Potassium: 4.9 mmol/L (ref 3.5–5.1)
Potassium: 5.7 mmol/L — ABNORMAL HIGH (ref 3.5–5.1)
Potassium: 4.2 mmol/L (ref 3.5–5.1)
Potassium: 4.3 mmol/L (ref 3.5–5.1)
Potassium: 4.1 mmol/L (ref 3.5–5.1)
Sodium: 132 mmol/L — ABNORMAL LOW (ref 135–145)
Sodium: 133 mmol/L — ABNORMAL LOW (ref 135–145)
Sodium: 134 mmol/L — ABNORMAL LOW (ref 135–145)
Sodium: 136 mmol/L (ref 135–145)
Sodium: 140 mmol/L (ref 135–145)
Sodium: 139 mmol/L (ref 135–145)
Sodium: 139 mmol/L (ref 135–145)
TCO2: 20 mmol/L — ABNORMAL LOW (ref 22–32)
TCO2: 23 mmol/L (ref 22–32)
TCO2: 24 mmol/L (ref 22–32)
TCO2: 25 mmol/L (ref 22–32)
TCO2: 29 mmol/L (ref 22–32)
TCO2: 27 mmol/L (ref 22–32)
TCO2: 27 mmol/L (ref 22–32)
pCO2 arterial: 31.9 mmHg — ABNORMAL LOW (ref 32.0–48.0)
pCO2 arterial: 46.4 mmHg (ref 32.0–48.0)
pCO2 arterial: 46.6 mmHg (ref 32.0–48.0)
pCO2 arterial: 65.1 mmHg (ref 32.0–48.0)
pCO2 arterial: 56.8 mmHg — ABNORMAL HIGH (ref 32.0–48.0)
pCO2 arterial: 48.6 mmHg — ABNORMAL HIGH (ref 32.0–48.0)
pCO2 arterial: 46.5 mmHg (ref 32.0–48.0)
pH, Arterial: 7.121 — CL (ref 7.350–7.450)
pH, Arterial: 7.287 — ABNORMAL LOW (ref 7.350–7.450)
pH, Arterial: 7.306 — ABNORMAL LOW (ref 7.350–7.450)
pH, Arterial: 7.373 (ref 7.350–7.450)
pH, Arterial: 7.29 — ABNORMAL LOW (ref 7.350–7.450)
pH, Arterial: 7.33 — ABNORMAL LOW (ref 7.350–7.450)
pH, Arterial: 7.349 — ABNORMAL LOW (ref 7.350–7.450)
pO2, Arterial: 130 mmHg — ABNORMAL HIGH (ref 83.0–108.0)
pO2, Arterial: 177 mmHg — ABNORMAL HIGH (ref 83.0–108.0)
pO2, Arterial: 333 mmHg — ABNORMAL HIGH (ref 83.0–108.0)
pO2, Arterial: 352 mmHg — ABNORMAL HIGH (ref 83.0–108.0)
pO2, Arterial: 95 mmHg (ref 83.0–108.0)
pO2, Arterial: 75 mmHg — ABNORMAL LOW (ref 83.0–108.0)
pO2, Arterial: 60 mmHg — ABNORMAL LOW (ref 83.0–108.0)

## 2018-12-21 LAB — POCT I-STAT, CHEM 8
BUN: 17 mg/dL (ref 8–23)
BUN: 18 mg/dL (ref 8–23)
BUN: 18 mg/dL (ref 8–23)
BUN: 18 mg/dL (ref 8–23)
BUN: 19 mg/dL (ref 8–23)
BUN: 20 mg/dL (ref 8–23)
BUN: 20 mg/dL (ref 8–23)
BUN: 18 mg/dL (ref 8–23)
Calcium, Ion: 1.01 mmol/L — ABNORMAL LOW (ref 1.15–1.40)
Calcium, Ion: 1.02 mmol/L — ABNORMAL LOW (ref 1.15–1.40)
Calcium, Ion: 1.04 mmol/L — ABNORMAL LOW (ref 1.15–1.40)
Calcium, Ion: 1.04 mmol/L — ABNORMAL LOW (ref 1.15–1.40)
Calcium, Ion: 1.06 mmol/L — ABNORMAL LOW (ref 1.15–1.40)
Calcium, Ion: 1.1 mmol/L — ABNORMAL LOW (ref 1.15–1.40)
Calcium, Ion: 1.18 mmol/L (ref 1.15–1.40)
Calcium, Ion: 1.09 mmol/L — ABNORMAL LOW (ref 1.15–1.40)
Chloride: 100 mmol/L (ref 98–111)
Chloride: 102 mmol/L (ref 98–111)
Chloride: 97 mmol/L — ABNORMAL LOW (ref 98–111)
Chloride: 98 mmol/L (ref 98–111)
Chloride: 98 mmol/L (ref 98–111)
Chloride: 99 mmol/L (ref 98–111)
Chloride: 99 mmol/L (ref 98–111)
Chloride: 102 mmol/L (ref 98–111)
Creatinine, Ser: 0.9 mg/dL (ref 0.44–1.00)
Creatinine, Ser: 0.9 mg/dL (ref 0.44–1.00)
Creatinine, Ser: 0.9 mg/dL (ref 0.44–1.00)
Creatinine, Ser: 0.9 mg/dL (ref 0.44–1.00)
Creatinine, Ser: 0.9 mg/dL (ref 0.44–1.00)
Creatinine, Ser: 0.9 mg/dL (ref 0.44–1.00)
Creatinine, Ser: 1 mg/dL (ref 0.44–1.00)
Creatinine, Ser: 0.9 mg/dL (ref 0.44–1.00)
Glucose, Bld: 118 mg/dL — ABNORMAL HIGH (ref 70–99)
Glucose, Bld: 131 mg/dL — ABNORMAL HIGH (ref 70–99)
Glucose, Bld: 141 mg/dL — ABNORMAL HIGH (ref 70–99)
Glucose, Bld: 169 mg/dL — ABNORMAL HIGH (ref 70–99)
Glucose, Bld: 172 mg/dL — ABNORMAL HIGH (ref 70–99)
Glucose, Bld: 174 mg/dL — ABNORMAL HIGH (ref 70–99)
Glucose, Bld: 177 mg/dL — ABNORMAL HIGH (ref 70–99)
Glucose, Bld: 102 mg/dL — ABNORMAL HIGH (ref 70–99)
HCT: 24 % — ABNORMAL LOW (ref 36.0–46.0)
HCT: 25 % — ABNORMAL LOW (ref 36.0–46.0)
HCT: 26 % — ABNORMAL LOW (ref 36.0–46.0)
HCT: 26 % — ABNORMAL LOW (ref 36.0–46.0)
HCT: 28 % — ABNORMAL LOW (ref 36.0–46.0)
HCT: 29 % — ABNORMAL LOW (ref 36.0–46.0)
HCT: 30 % — ABNORMAL LOW (ref 36.0–46.0)
HCT: 31 % — ABNORMAL LOW (ref 36.0–46.0)
Hemoglobin: 10.2 g/dL — ABNORMAL LOW (ref 12.0–15.0)
Hemoglobin: 8.2 g/dL — ABNORMAL LOW (ref 12.0–15.0)
Hemoglobin: 8.5 g/dL — ABNORMAL LOW (ref 12.0–15.0)
Hemoglobin: 8.8 g/dL — ABNORMAL LOW (ref 12.0–15.0)
Hemoglobin: 8.8 g/dL — ABNORMAL LOW (ref 12.0–15.0)
Hemoglobin: 9.5 g/dL — ABNORMAL LOW (ref 12.0–15.0)
Hemoglobin: 9.9 g/dL — ABNORMAL LOW (ref 12.0–15.0)
Hemoglobin: 10.5 g/dL — ABNORMAL LOW (ref 12.0–15.0)
Potassium: 4.2 mmol/L (ref 3.5–5.1)
Potassium: 4.5 mmol/L (ref 3.5–5.1)
Potassium: 4.7 mmol/L (ref 3.5–5.1)
Potassium: 4.7 mmol/L (ref 3.5–5.1)
Potassium: 4.8 mmol/L (ref 3.5–5.1)
Potassium: 4.9 mmol/L (ref 3.5–5.1)
Potassium: 4.9 mmol/L (ref 3.5–5.1)
Potassium: 4.2 mmol/L (ref 3.5–5.1)
Sodium: 133 mmol/L — ABNORMAL LOW (ref 135–145)
Sodium: 133 mmol/L — ABNORMAL LOW (ref 135–145)
Sodium: 134 mmol/L — ABNORMAL LOW (ref 135–145)
Sodium: 135 mmol/L (ref 135–145)
Sodium: 135 mmol/L (ref 135–145)
Sodium: 137 mmol/L (ref 135–145)
Sodium: 139 mmol/L (ref 135–145)
Sodium: 139 mmol/L (ref 135–145)
TCO2: 21 mmol/L — ABNORMAL LOW (ref 22–32)
TCO2: 22 mmol/L (ref 22–32)
TCO2: 23 mmol/L (ref 22–32)
TCO2: 23 mmol/L (ref 22–32)
TCO2: 24 mmol/L (ref 22–32)
TCO2: 25 mmol/L (ref 22–32)
TCO2: 27 mmol/L (ref 22–32)
TCO2: 28 mmol/L (ref 22–32)

## 2018-12-21 LAB — BPAM FFP
Blood Product Expiration Date: 202010272359
Blood Product Expiration Date: 202010272359
Blood Product Expiration Date: 202010292359
Blood Product Expiration Date: 202010292359
ISSUE DATE / TIME: 202010242321
ISSUE DATE / TIME: 202010242321
ISSUE DATE / TIME: 202010242321
ISSUE DATE / TIME: 202010242321
Unit Type and Rh: 600
Unit Type and Rh: 6200
Unit Type and Rh: 6200
Unit Type and Rh: 6200

## 2018-12-21 LAB — CBC
HCT: 32.4 % — ABNORMAL LOW (ref 36.0–46.0)
HCT: 28.6 % — ABNORMAL LOW (ref 36.0–46.0)
Hemoglobin: 10.1 g/dL — ABNORMAL LOW (ref 12.0–15.0)
Hemoglobin: 8.9 g/dL — ABNORMAL LOW (ref 12.0–15.0)
MCH: 24 pg — ABNORMAL LOW (ref 26.0–34.0)
MCH: 23.7 pg — ABNORMAL LOW (ref 26.0–34.0)
MCHC: 31.2 g/dL (ref 30.0–36.0)
MCHC: 31.1 g/dL (ref 30.0–36.0)
MCV: 77 fL — ABNORMAL LOW (ref 80.0–100.0)
MCV: 76.3 fL — ABNORMAL LOW (ref 80.0–100.0)
Platelets: 142 10*3/uL — ABNORMAL LOW (ref 150–400)
Platelets: 125 10*3/uL — ABNORMAL LOW (ref 150–400)
RBC: 4.21 MIL/uL (ref 3.87–5.11)
RBC: 3.75 MIL/uL — ABNORMAL LOW (ref 3.87–5.11)
RDW: 20.1 % — ABNORMAL HIGH (ref 11.5–15.5)
RDW: 19.6 % — ABNORMAL HIGH (ref 11.5–15.5)
WBC: 20.4 10*3/uL — ABNORMAL HIGH (ref 4.0–10.5)
WBC: 12.6 10*3/uL — ABNORMAL HIGH (ref 4.0–10.5)
nRBC: 0.1 % (ref 0.0–0.2)
nRBC: 0.2 % (ref 0.0–0.2)

## 2018-12-21 LAB — GLUCOSE, CAPILLARY
Glucose-Capillary: 56 mg/dL — ABNORMAL LOW (ref 70–99)
Glucose-Capillary: 64 mg/dL — ABNORMAL LOW (ref 70–99)
Glucose-Capillary: 142 mg/dL — ABNORMAL HIGH (ref 70–99)
Glucose-Capillary: 129 mg/dL — ABNORMAL HIGH (ref 70–99)
Glucose-Capillary: 106 mg/dL — ABNORMAL HIGH (ref 70–99)
Glucose-Capillary: 129 mg/dL — ABNORMAL HIGH (ref 70–99)
Glucose-Capillary: 136 mg/dL — ABNORMAL HIGH (ref 70–99)
Glucose-Capillary: 105 mg/dL — ABNORMAL HIGH (ref 70–99)
Glucose-Capillary: 89 mg/dL (ref 70–99)
Glucose-Capillary: 88 mg/dL (ref 70–99)
Glucose-Capillary: 134 mg/dL — ABNORMAL HIGH (ref 70–99)
Glucose-Capillary: 142 mg/dL — ABNORMAL HIGH (ref 70–99)
Glucose-Capillary: 139 mg/dL — ABNORMAL HIGH (ref 70–99)
Glucose-Capillary: 119 mg/dL — ABNORMAL HIGH (ref 70–99)
Glucose-Capillary: 94 mg/dL (ref 70–99)
Glucose-Capillary: 99 mg/dL (ref 70–99)

## 2018-12-21 LAB — BASIC METABOLIC PANEL
Anion gap: 6 (ref 5–15)
Anion gap: 9 (ref 5–15)
BUN: 15 mg/dL (ref 8–23)
BUN: 22 mg/dL (ref 8–23)
CO2: 25 mmol/L (ref 22–32)
CO2: 23 mmol/L (ref 22–32)
Calcium: 6.2 mg/dL — CL (ref 8.9–10.3)
Calcium: 7.7 mg/dL — ABNORMAL LOW (ref 8.9–10.3)
Chloride: 110 mmol/L (ref 98–111)
Chloride: 105 mmol/L (ref 98–111)
Creatinine, Ser: 0.86 mg/dL (ref 0.44–1.00)
Creatinine, Ser: 1.07 mg/dL — ABNORMAL HIGH (ref 0.44–1.00)
GFR calc Af Amer: >60 mL/min (ref >60)
GFR calc Af Amer: 56 mL/min — ABNORMAL LOW (ref >60)
GFR calc non Af Amer: >60 mL/min (ref >60)
GFR calc non Af Amer: 48 mL/min — ABNORMAL LOW (ref >60)
Glucose, Bld: 101 mg/dL — ABNORMAL HIGH (ref 70–99)
Glucose, Bld: 133 mg/dL — ABNORMAL HIGH (ref 70–99)
Potassium: 3.9 mmol/L (ref 3.5–5.1)
Potassium: 4.4 mmol/L (ref 3.5–5.1)
Sodium: 141 mmol/L (ref 135–145)
Sodium: 137 mmol/L (ref 135–145)

## 2018-12-21 LAB — PREPARE FRESH FROZEN PLASMA
Unit division: 0
Unit division: 0
Unit division: 0

## 2018-12-21 LAB — MRSA PCR SCREENING: MRSA by PCR: NEGATIVE

## 2018-12-21 LAB — APTT: aPTT: 94 seconds — ABNORMAL HIGH (ref 24–36)

## 2018-12-21 LAB — HEMOGLOBIN AND HEMATOCRIT, BLOOD
HCT: 22 % — ABNORMAL LOW (ref 36.0–46.0)
Hemoglobin: 6.8 g/dL — CL (ref 12.0–15.0)

## 2018-12-21 LAB — PROTIME-INR
INR: 2.1 — ABNORMAL HIGH (ref 0.8–1.2)
Prothrombin Time: 23.3 seconds — ABNORMAL HIGH (ref 11.4–15.2)

## 2018-12-21 LAB — FIBRINOGEN: Fibrinogen: 373 mg/dL (ref 210–475)

## 2018-12-21 LAB — MAGNESIUM
Magnesium: 2.1 mg/dL (ref 1.7–2.4)
Magnesium: 3.3 mg/dL — ABNORMAL HIGH (ref 1.7–2.4)

## 2018-12-21 LAB — PLATELET COUNT: Platelets: 56 10*3/uL — ABNORMAL LOW (ref 150–400)

## 2018-12-21 MED ORDER — VANCOMYCIN HCL 1000 MG IV SOLR
INTRAVENOUS | Status: DC | PRN
  Administered 2018-12-21 (×4): 1000 mg

## 2018-12-21 MED ORDER — SODIUM CHLORIDE 0.9 % IR SOLN
Status: DC | PRN
  Administered 2018-12-21: 1000 mL

## 2018-12-21 MED ORDER — PANTOPRAZOLE SODIUM 40 MG PO TBEC
40.0000 mg | DELAYED_RELEASE_TABLET | Freq: Every day | ORAL | Status: DC
  Administered 2018-12-23 – 2019-01-16 (×24): 40 mg via ORAL
  Filled 2018-12-21 (×26): qty 1

## 2018-12-21 MED ORDER — HEPARIN SODIUM (PORCINE) 1000 UNIT/ML IJ SOLN
INTRAMUSCULAR | Status: AC
  Filled 2018-12-21: qty 1

## 2018-12-21 MED ORDER — SODIUM CHLORIDE 0.9% FLUSH
3.0000 mL | Freq: Two times a day (BID) | INTRAVENOUS | Status: DC
  Administered 2018-12-22 – 2019-01-15 (×33): 3 mL via INTRAVENOUS

## 2018-12-21 MED ORDER — LACTATED RINGERS IV SOLN: 500.0000 mL | Freq: Once | INTRAVENOUS | Status: DC | PRN

## 2018-12-21 MED ORDER — MIDAZOLAM HCL 2 MG/2ML IJ SOLN: 2.0000 mg | INTRAMUSCULAR | Status: DC | PRN

## 2018-12-21 MED ORDER — ACETAMINOPHEN 500 MG PO TABS
1000.0000 mg | ORAL_TABLET | Freq: Four times a day (QID) | ORAL | Status: AC
  Administered 2018-12-21: 1000 mg via ORAL
  Administered 2018-12-22: 500 mg via ORAL
  Administered 2018-12-22 – 2018-12-25 (×6): 1000 mg via ORAL
  Filled 2018-12-21 (×11): qty 2

## 2018-12-21 MED ORDER — PHENYLEPHRINE HCL-NACL 20-0.9 MG/250ML-% IV SOLN: 0.0000 ug/min | INTRAVENOUS | Status: DC

## 2018-12-21 MED ORDER — DEXMEDETOMIDINE HCL IN NACL 400 MCG/100ML IV SOLN
0.0000 ug/kg/h | INTRAVENOUS | Status: DC
  Administered 2018-12-21: 0.7 ug/kg/h via INTRAVENOUS

## 2018-12-21 MED ORDER — PHENYLEPHRINE 40 MCG/ML (10ML) SYRINGE FOR IV PUSH (FOR BLOOD PRESSURE SUPPORT)
PREFILLED_SYRINGE | INTRAVENOUS | Status: AC
  Filled 2018-12-21: qty 10

## 2018-12-21 MED ORDER — SODIUM CHLORIDE (PF) 0.9 % IJ SOLN
INTRAMUSCULAR | Status: AC
  Filled 2018-12-21: qty 40

## 2018-12-21 MED ORDER — MORPHINE SULFATE (PF) 2 MG/ML IV SOLN
1.0000 mg | INTRAVENOUS | Status: DC | PRN
  Administered 2018-12-21: 2 mg via INTRAVENOUS
  Filled 2018-12-21 (×2): qty 1

## 2018-12-21 MED ORDER — SODIUM CHLORIDE 0.9 % IV SOLN: 250.0000 mL | INTRAVENOUS | Status: DC

## 2018-12-21 MED ORDER — PROTAMINE SULFATE 10 MG/ML IV SOLN
INTRAVENOUS | Status: AC
  Filled 2018-12-21: qty 25

## 2018-12-21 MED ORDER — FAMOTIDINE IN NACL 20-0.9 MG/50ML-% IV SOLN
20.0000 mg | Freq: Two times a day (BID) | INTRAVENOUS | Status: AC
  Administered 2018-12-21 (×2): 20 mg via INTRAVENOUS
  Filled 2018-12-21: qty 50

## 2018-12-21 MED ORDER — SODIUM CHLORIDE 0.9 % IV SOLN
1.5000 g | Freq: Two times a day (BID) | INTRAVENOUS | Status: AC
  Administered 2018-12-21 – 2018-12-22 (×4): 1.5 g via INTRAVENOUS
  Filled 2018-12-21 (×4): qty 1.5

## 2018-12-21 MED ORDER — ASPIRIN 81 MG PO CHEW
81.0000 mg | CHEWABLE_TABLET | Freq: Every day | ORAL | Status: DC
  Administered 2018-12-21 – 2019-01-16 (×25): 81 mg via ORAL
  Filled 2018-12-21 (×29): qty 1

## 2018-12-21 MED ORDER — ETOMIDATE 2 MG/ML IV SOLN
INTRAVENOUS | Status: AC
  Filled 2018-12-21: qty 10

## 2018-12-21 MED ORDER — EPINEPHRINE HCL 5 MG/250ML IV SOLN IN NS: 0.0000 ug/min | INTRAVENOUS | Status: DC

## 2018-12-21 MED ORDER — OXYCODONE HCL 5 MG PO TABS
5.0000 mg | ORAL_TABLET | ORAL | Status: DC | PRN
  Administered 2018-12-31: 07:00:00 10 mg via ORAL
  Filled 2018-12-21: qty 2

## 2018-12-21 MED ORDER — ARTIFICIAL TEARS OPHTHALMIC OINT
TOPICAL_OINTMENT | OPHTHALMIC | Status: AC
  Filled 2018-12-21: qty 3.5

## 2018-12-21 MED ORDER — LIDOCAINE 2% (20 MG/ML) 5 ML SYRINGE
INTRAMUSCULAR | Status: AC
  Filled 2018-12-21: qty 5

## 2018-12-21 MED ORDER — ALBUMIN HUMAN 5 % IV SOLN
250.0000 mL | INTRAVENOUS | Status: AC | PRN
  Administered 2018-12-21 (×4): 12.5 g via INTRAVENOUS
  Filled 2018-12-21: qty 250

## 2018-12-21 MED ORDER — MIDAZOLAM HCL 2 MG/2ML IJ SOLN
INTRAMUSCULAR | Status: AC
  Filled 2018-12-21: qty 2

## 2018-12-21 MED ORDER — NOREPINEPHRINE 4 MG/250ML-% IV SOLN: 0.0000 ug/min | INTRAVENOUS | Status: DC

## 2018-12-21 MED ORDER — BISACODYL 10 MG RE SUPP: 10.0000 mg | Freq: Every day | RECTAL | Status: DC

## 2018-12-21 MED ORDER — LACTATED RINGERS IV SOLN
INTRAVENOUS | Status: DC
  Administered 2018-12-21: 07:00:00 via INTRAVENOUS

## 2018-12-21 MED ORDER — SODIUM CHLORIDE (PF) 0.9 % IJ SOLN
OROMUCOSAL | Status: DC | PRN
  Administered 2018-12-21 (×3): 4 mL via TOPICAL

## 2018-12-21 MED ORDER — ACETAMINOPHEN 160 MG/5ML PO SOLN: 650.0000 mg | Freq: Once | ORAL | Status: AC

## 2018-12-21 MED ORDER — PLASMA-LYTE 148 IV SOLN
INTRAVENOUS | Status: DC | PRN
  Administered 2018-12-21: 500 mL via INTRAVASCULAR

## 2018-12-21 MED ORDER — CHLORHEXIDINE GLUCONATE CLOTH 2 % EX PADS
6.0000 | MEDICATED_PAD | Freq: Every day | CUTANEOUS | Status: DC
  Administered 2018-12-21 – 2019-01-15 (×20): 6 via TOPICAL

## 2018-12-21 MED ORDER — NITROGLYCERIN IN D5W 200-5 MCG/ML-% IV SOLN: 0.0000 ug/min | INTRAVENOUS | Status: DC

## 2018-12-21 MED ORDER — VANCOMYCIN HCL 1000 MG IV SOLR
INTRAVENOUS | Status: AC
  Filled 2018-12-21: qty 4000

## 2018-12-21 MED ORDER — METOPROLOL TARTRATE 5 MG/5ML IV SOLN
2.5000 mg | INTRAVENOUS | Status: DC | PRN
  Administered 2018-12-22 – 2018-12-27 (×2): 5 mg via INTRAVENOUS
  Filled 2018-12-21 (×2): qty 5

## 2018-12-21 MED ORDER — PROTAMINE SULFATE 10 MG/ML IV SOLN
INTRAVENOUS | Status: DC | PRN
  Administered 2018-12-21: 280 mg via INTRAVENOUS
  Administered 2018-12-21: 30 mg via INTRAVENOUS

## 2018-12-21 MED ORDER — ASPIRIN 81 MG PO CHEW: 324.0000 mg | CHEWABLE_TABLET | Freq: Every day | ORAL | Status: DC

## 2018-12-21 MED ORDER — BISACODYL 5 MG PO TBEC
10.0000 mg | DELAYED_RELEASE_TABLET | Freq: Every day | ORAL | Status: DC
  Administered 2018-12-23 – 2019-01-14 (×11): 10 mg via ORAL
  Filled 2018-12-21 (×19): qty 2

## 2018-12-21 MED ORDER — METHYLPREDNISOLONE SODIUM SUCC 125 MG IJ SOLR
INTRAMUSCULAR | Status: DC | PRN
  Administered 2018-12-21: 125 mg via INTRAVENOUS

## 2018-12-21 MED ORDER — ASPIRIN EC 325 MG PO TBEC: 325.0000 mg | DELAYED_RELEASE_TABLET | Freq: Every day | ORAL | Status: DC

## 2018-12-21 MED ORDER — CALCIUM GLUCONATE-NACL 1-0.675 GM/50ML-% IV SOLN
1.0000 g | Freq: Once | INTRAVENOUS | Status: AC
  Administered 2018-12-21: 1000 mg via INTRAVENOUS
  Filled 2018-12-21: qty 50

## 2018-12-21 MED ORDER — SUCCINYLCHOLINE CHLORIDE 200 MG/10ML IV SOSY
PREFILLED_SYRINGE | INTRAVENOUS | Status: AC
  Filled 2018-12-21: qty 10

## 2018-12-21 MED ORDER — MAGNESIUM SULFATE 4 GM/100ML IV SOLN
4.0000 g | Freq: Once | INTRAVENOUS | Status: AC
  Administered 2018-12-21: 4 g via INTRAVENOUS
  Filled 2018-12-21: qty 100

## 2018-12-21 MED ORDER — PROPOFOL 10 MG/ML IV BOLUS
INTRAVENOUS | Status: DC | PRN
  Administered 2018-12-21: 30 mg via INTRAVENOUS
  Administered 2018-12-21: 50 mg via INTRAVENOUS

## 2018-12-21 MED ORDER — METOPROLOL TARTRATE 12.5 MG HALF TABLET
12.5000 mg | ORAL_TABLET | Freq: Two times a day (BID) | ORAL | Status: DC
  Administered 2018-12-21 – 2018-12-22 (×3): 12.5 mg via ORAL
  Filled 2018-12-21 (×3): qty 1

## 2018-12-21 MED ORDER — HEMOSTATIC AGENTS (NO CHARGE) OPTIME
TOPICAL | Status: DC | PRN
  Administered 2018-12-21 (×3): 1 via TOPICAL

## 2018-12-21 MED ORDER — METOPROLOL TARTRATE 25 MG/10 ML ORAL SUSPENSION: 12.5000 mg | Freq: Two times a day (BID) | ORAL | Status: DC

## 2018-12-21 MED ORDER — SODIUM CHLORIDE 0.45 % IV SOLN
INTRAVENOUS | Status: DC | PRN
  Administered 2018-12-21 (×2): via INTRAVENOUS

## 2018-12-21 MED ORDER — CHLORHEXIDINE GLUCONATE 0.12 % MT SOLN
15.0000 mL | OROMUCOSAL | Status: AC
  Administered 2018-12-21: 15 mL via OROMUCOSAL

## 2018-12-21 MED ORDER — SODIUM CHLORIDE 0.9% FLUSH: 3.0000 mL | INTRAVENOUS | Status: DC | PRN

## 2018-12-21 MED ORDER — TRAMADOL HCL 50 MG PO TABS
50.0000 mg | ORAL_TABLET | ORAL | Status: DC | PRN
  Administered 2018-12-23: 100 mg via ORAL
  Administered 2018-12-24: 50 mg via ORAL
  Filled 2018-12-21: qty 1
  Filled 2018-12-21: qty 2

## 2018-12-21 MED ORDER — PHENYLEPHRINE HCL (PRESSORS) 10 MG/ML IV SOLN
INTRAVENOUS | Status: DC | PRN
  Administered 2018-12-21 (×3): 80 ug via INTRAVENOUS

## 2018-12-21 MED ORDER — INSULIN REGULAR BOLUS VIA INFUSION
0.0000 [IU] | Freq: Three times a day (TID) | INTRAVENOUS | Status: DC
  Filled 2018-12-21: qty 10

## 2018-12-21 MED ORDER — DOCUSATE SODIUM 100 MG PO CAPS
200.0000 mg | ORAL_CAPSULE | Freq: Every day | ORAL | Status: DC
  Administered 2018-12-23 – 2019-01-14 (×11): 200 mg via ORAL
  Filled 2018-12-21 (×21): qty 2

## 2018-12-21 MED ORDER — ACETAMINOPHEN 650 MG RE SUPP: 650.0000 mg | Freq: Once | RECTAL | Status: AC

## 2018-12-21 MED ORDER — LACTATED RINGERS IV SOLN: INTRAVENOUS | Status: DC

## 2018-12-21 MED ORDER — ACETAMINOPHEN 160 MG/5ML PO SOLN
1000.0000 mg | Freq: Four times a day (QID) | ORAL | Status: AC
  Administered 2018-12-21 – 2018-12-25 (×2): 1000 mg
  Filled 2018-12-21 (×2): qty 40.6

## 2018-12-21 MED ORDER — INSULIN REGULAR(HUMAN) IN NACL 100-0.9 UT/100ML-% IV SOLN
INTRAVENOUS | Status: DC
  Administered 2018-12-21: 0.8 [IU]/h via INTRAVENOUS
  Filled 2018-12-21: qty 100

## 2018-12-21 MED ORDER — DEXTROSE 50 % IV SOLN
INTRAVENOUS | Status: AC
  Administered 2018-12-21: 50 mL
  Filled 2018-12-21: qty 50

## 2018-12-21 MED ORDER — PROTAMINE SULFATE 10 MG/ML IV SOLN
INTRAVENOUS | Status: AC
  Filled 2018-12-21: qty 5

## 2018-12-21 MED ORDER — SODIUM CHLORIDE 0.9 % IV SOLN
INTRAVENOUS | Status: DC
  Administered 2018-12-21 (×2): via INTRAVENOUS

## 2018-12-21 MED ORDER — ROCURONIUM BROMIDE 10 MG/ML (PF) SYRINGE
PREFILLED_SYRINGE | INTRAVENOUS | Status: AC
  Filled 2018-12-21: qty 20

## 2018-12-21 MED ORDER — POTASSIUM CHLORIDE 10 MEQ/50ML IV SOLN: 10.0000 meq | INTRAVENOUS | Status: AC

## 2018-12-21 MED ORDER — ONDANSETRON HCL 4 MG/2ML IJ SOLN
4.0000 mg | Freq: Four times a day (QID) | INTRAMUSCULAR | Status: DC | PRN
  Administered 2018-12-23 – 2018-12-26 (×2): 4 mg via INTRAVENOUS
  Filled 2018-12-21 (×2): qty 2

## 2018-12-21 MED ORDER — LEVOTHYROXINE SODIUM 75 MCG PO TABS
150.0000 ug | ORAL_TABLET | Freq: Every day | ORAL | Status: DC
  Administered 2018-12-22 – 2019-01-16 (×23): 150 ug via ORAL
  Filled 2018-12-21 (×7): qty 2
  Filled 2018-12-21: qty 1
  Filled 2018-12-21 (×7): qty 2
  Filled 2018-12-21: qty 1
  Filled 2018-12-21 (×2): qty 2
  Filled 2018-12-21: qty 1
  Filled 2018-12-21 (×10): qty 2

## 2018-12-21 MED ORDER — VANCOMYCIN HCL IN DEXTROSE 1-5 GM/200ML-% IV SOLN
1000.0000 mg | Freq: Once | INTRAVENOUS | Status: AC
  Administered 2018-12-21: 1000 mg via INTRAVENOUS
  Filled 2018-12-21: qty 200

## 2018-12-21 MED ORDER — EPINEPHRINE PF 1 MG/ML IJ SOLN: 0.0000 ug/min | INTRAVENOUS | Status: DC

## 2018-12-21 NOTE — Brief Op Note (Signed)
      [address]       [place] [address]             [phone]        12/20/2018  12:10 PM  PATIENT:  Kaitlyn Rodriguez  85 y.o. female  PRE-OPERATIVE DIAGNOSIS:  TYPE A AORTIC DISSECTION  POST-OPERATIVE DIAGNOSIS:  TYPE A AORTIC DISSECTION  PROCEDURE:  Procedure(s): REPAIR OF TYPE A AORTIC DISSECTION USING HEMASHIELD PLATINUM 28MM AND 32MM GRAFT (N/A) STERNAL PLATING  SURGEON:  Surgeon(s) and Role:    * Wonda Olds, MD - Primary  PHYSICIAN ASSISTANT:   Nicholes Rough, PA-C   ANESTHESIA:   general  EBL:  3100 mL   BLOOD ADMINISTERED:none  DRAINS: ROUTINE   LOCAL MEDICATIONS USED:  NONE  SPECIMEN:  Source of Specimen:  PORTION OF THE AORTA  DISPOSITION OF SPECIMEN:  PATHOLOGY  COUNTS:  YES  DICTATION: .Dragon Dictation  PLAN OF CARE: Admit to inpatient   PATIENT DISPOSITION:  ICU - intubated and hemodynamically stable.   Delay start of Pharmacological VTE agent (>24hrs) due to surgical blood loss or risk of bleeding: yes

## 2018-12-21 NOTE — Procedures (Signed)
Extubation Procedure Note  Patient Details:   Name: Kaitlyn Rodriguez DOB: [birth year] MRN: [id number]   Airway Documentation:    Vent end date: 12/21/18 Vent end time: 1220   Evaluation  O2 sats: stable throughout Complications: No apparent complications Patient did tolerate procedure well. Bilateral Breath Sounds: Clear, Diminished   Patient extubated to 4L  per SICU wean protocol. Patient met all weaning parameters along with positive cuff leak prior to extubation. Patient able to cough & speak post extubation. IS instructed  Kathie Dike 12/21/2018, 12:22 PM

## 2018-12-21 NOTE — Anesthesia Procedure Notes (Signed)
Central Venous Catheter Insertion Performed by: Suzette Battiest, MD, anesthesiologist Start/End10/24/2020 10:40 PM, 12/20/2018 10:55 PM Patient location: Pre-op. Preanesthetic checklist: patient identified, IV checked, site marked, risks and benefits discussed, surgical consent, monitors and equipment checked, pre-op evaluation, timeout performed and anesthesia consent Position: Trendelenburg Lidocaine 1% used for infiltration and patient sedated Hand hygiene performed , maximum sterile barriers used  and Seldinger technique used Catheter size: 8.5 Fr Total catheter length 10. Central line and PA cath was placed.Sheath introducer Swan type:thermodilution PA Cath depth:50 Procedure performed using ultrasound guided technique. Ultrasound Notes:anatomy identified, needle tip was noted to be adjacent to the nerve/plexus identified, no ultrasound evidence of intravascular and/or intraneural injection and image(s) printed for medical record Attempts: 1 Following insertion, line sutured, dressing applied and Biopatch. Post procedure assessment: blood return through all ports, free fluid flow and no air  Patient tolerated the procedure well with no immediate complications.

## 2018-12-21 NOTE — Progress Notes (Signed)
DAILY PROGRESS NOTE   Patient Name: Kaitlyn Rodriguez Date of Encounter: 12/21/2018 Cardiologist: No primary care provider on file.  Chief Complaint   Intubated, sedated on vent  Patient Profile   Kaitlyn Rodriguez is a 85 y.o. 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  Subjective   Underwent urgent surgery this morning - appears to have been transfused up to 10/32 today (looks like at least 2 units given and 2 packs platelets). Remains hypotensive - also hypoglycemic this am. Troponin elevated at 518 -> 492. BNP 233. D-dimer 3.42. In paced rhythm at 90.  Objective   Vitals:   12/21/18 0730 12/21/18 0745 12/21/18 0800 12/21/18 0815  BP: (!) 85/64 (!) 88/76 (!) 77/63   Pulse: 80 88 89 89  Resp: (!) 6 20 12 16   Temp: 98.1 F (36.7 C) 98.1 F (36.7 C) 98.2 F (36.8 C) 98.1 F (36.7 C)  TempSrc:   Core   SpO2: 97% 97% 95% 94%  Weight:      Height:        Intake/Output Summary (Last 24 hours) at 12/21/2018 0842 Last data filed at 12/21/2018 0810 Gross per 24 hour  Intake 5556.07 ml  Output 4424 ml  Net 1132.07 ml   Filed Weights   12/20/18 1714  Weight: 86.2 kg    Physical Exam   General appearance: intubated, sedated on vent Neck: no carotid bruit, no JVD and thyroid not enlarged, symmetric, no tenderness/mass/nodules Lungs: diminished breath sounds bilaterally Heart: regular rate and rhythm Abdomen: soft, non-tender; bowel sounds normal; no masses,  no organomegaly Extremities: extremities normal, atraumatic, no cyanosis or edema Pulses: 1+ pulses Skin: pale, cool, dry Neurologic: Mental status: intubated, sedated on vent Psych: Cannot assess  Inpatient Medications    Scheduled Meds:  acetaminophen  1,000 mg Oral Q6H   Or   acetaminophen (TYLENOL) oral liquid 160 mg/5 mL  1,000 mg Per Tube Q6H   acetaminophen  (TYLENOL) oral liquid 160 mg/5 mL  650 mg Per Tube Once   Or   acetaminophen  650 mg Rectal Once   [START ON 12/22/2018] aspirin EC  325 mg Oral Daily   Or   [START ON 12/22/2018] aspirin  324 mg Per Tube Daily   [START ON 12/22/2018] bisacodyl  10 mg Oral Daily   Or   [START ON 12/22/2018] bisacodyl  10 mg Rectal Daily   Chlorhexidine Gluconate Cloth  6 each Topical Daily   [START ON 12/22/2018] docusate sodium  200 mg Oral Daily   insulin regular  0-10 Units Intravenous TID WC   [START ON 12/22/2018] levothyroxine  150 mcg Oral QAC breakfast   metoprolol tartrate  12.5 mg Oral BID   Or   metoprolol tartrate  12.5 mg Per Tube BID   [START ON 12/23/2018] pantoprazole  40 mg Oral Daily   [START ON 12/22/2018] sodium chloride flush  3 mL Intravenous Q12H    Continuous Infusions:  sodium chloride 20 mL/hr at 12/21/18 0810   [START ON 12/22/2018] sodium chloride     sodium chloride 20 mL/hr at 12/21/18 0752   albumin human 12.5 g (12/21/18 0807)   calcium gluconate 1,000 mg (12/21/18 0828)   cefUROXime (ZINACEF)  IV     dexmedetomidine (PRECEDEX) IV infusion 0.7 mcg/kg/hr (12/21/18 0810)   epinephrine     famotidine (PEPCID) IV  insulin 2.9 Units/hr (12/21/18 0620)   lactated ringers     lactated ringers     lactated ringers 20 mL/hr at 12/21/18 0810   magnesium sulfate 4 g (12/21/18 0810)   nitroGLYCERIN 0 mcg/min (12/21/18 0630)   norepinephrine (LEVOPHED) Adult infusion     phenylephrine (NEO-SYNEPHRINE) Adult infusion 30 mcg/min (12/21/18 0810)   potassium chloride     vancomycin      PRN Meds: sodium chloride, albumin human, lactated ringers, metoprolol tartrate, midazolam, morphine injection, ondansetron (ZOFRAN) IV, oxyCODONE, [START ON 12/22/2018] sodium chloride flush, traMADol   Labs   Results for orders placed or performed during the hospital encounter of 12/20/18 (from the past 48 hour(s))  Basic metabolic panel     Status:  Abnormal   Collection Time: 12/20/18  6:07 PM  Result Value Ref Range   Sodium 133 (L) 135 - 145 mmol/L   Potassium 4.2 3.5 - 5.1 mmol/L   Chloride 99 98 - 111 mmol/L   CO2 17 (L) 22 - 32 mmol/L   Glucose, Bld 288 (H) 70 - 99 mg/dL   BUN 13 8 - 23 mg/dL   Creatinine, Ser 6.96 (H) 0.44 - 1.00 mg/dL   Calcium 9.2 8.9 - 29.5 mg/dL   GFR calc non Af Amer 50 (L) >60 mL/min   GFR calc Af Amer 58 (L) >60 mL/min   Anion gap 17 (H) 5 - 15    Comment: Performed at [facility], [address], [place], [place] [address]  Troponin I (High Sensitivity)     Status: Abnormal   Collection Time: 12/20/18  6:07 PM  Result Value Ref Range   Troponin I (High Sensitivity) 518 (HH) <18 ng/L    Comment: CRITICAL RESULT CALLED TO, READ BACK BY AND VERIFIED WITH: S.WILSON,RN @ 2004 12/20/2018 WEBBERJ (NOTE) Elevated high sensitivity troponin I (hsTnI) values and significant  changes across serial measurements may suggest ACS but many other  chronic and acute conditions are known to elevate hsTnI results.  Refer to the Links section for chest pain algorithms and additional  guidance. Performed at [facility], [address] [place], [place] [address]   CBC     Status: Abnormal   Collection Time: 12/20/18  6:07 PM  Result Value Ref Range   WBC 13.2 (H) 4.0 - 10.5 K/uL   RBC 4.10 3.87 - 5.11 MIL/uL   Hemoglobin 8.8 (L) 12.0 - 15.0 g/dL    Comment: Reticulocyte Hemoglobin testing may be clinically indicated, consider ordering this additional test [id number]    HCT 30.8 (L) 36.0 - 46.0 %   MCV 75.1 (L) 80.0 - 100.0 fL   MCH 21.5 (L) 26.0 - 34.0 pg   MCHC 28.6 (L) 30.0 - 36.0 g/dL   RDW 44.0 (H) 34.7 - 42.5 %   Platelets 479 (H) 150 - 400 K/uL   nRBC 0.0 0.0 - 0.2 %    Comment: Performed at [facility], [address], [place], [place] [address]  D-dimer, quantitative (not at [facility])     Status: Abnormal   Collection Time: 12/20/18  6:07 PM  Result Value Ref Range   D-Dimer,  Quant 3.42 (H) 0.00 - 0.50 ug/mL-FEU    Comment: (NOTE) At the manufacturer cut-off of 0.50 ug/mL FEU, this assay has been documented to exclude PE with a sensitivity and negative predictive value of 97 to 99%.  At this time, this assay has not been approved by the FDA to exclude  DVT/VTE. Results should be correlated with clinical presentation. Performed at [facility], [address], [place], [place] [address]   Brain natriuretic peptide     Status: Abnormal   Collection Time: 12/20/18  6:07 PM  Result Value Ref Range   B Natriuretic Peptide 233.8 (H) 0.0 - 100.0 pg/mL    Comment: Performed at [facility], [address] [place], [place] [address]  Protime-INR     Status: Abnormal   Collection Time: 12/20/18  6:07 PM  Result Value Ref Range   Prothrombin Time 16.2 (H) 11.4 - 15.2 seconds   INR 1.3 (H) 0.8 - 1.2    Comment: (NOTE) INR goal varies based on device and disease states. Performed at [facility], [address] [place], [place] [address]   TSH     Status: None   Collection Time: 12/20/18  6:08 PM  Result Value Ref Range   TSH 1.293 0.350 - 4.500 uIU/mL    Comment: Performed by a 3rd Generation assay with a functional sensitivity of <=0.01 uIU/mL. Performed at [facility], [address] [place], [place] [address]   CBG monitoring, ED     Status: Abnormal   Collection Time: 12/20/18  6:12 PM  Result Value Ref Range   Glucose-Capillary 262 (H) 70 - 99 mg/dL  Type and screen     Status: None (Preliminary result)   Collection Time: 12/20/18  6:58 PM  Result Value Ref Range   ABO/RH(D) O POS    Antibody Screen NEG    Sample Expiration 12/23/2018,2359    Unit Number [id number]    Blood Component Type RED CELLS,LR    Unit division 00    Status of Unit ISSUED    Transfusion Status OK TO TRANSFUSE    Crossmatch Result Compatible    Unit Number [id number]    Blood Component Type RED CELLS,LR    Unit division 00    Status of  Unit ISSUED    Transfusion Status OK TO TRANSFUSE    Crossmatch Result Compatible    Unit Number [id number]    Blood Component Type RED CELLS,LR    Unit division 00    Status of Unit ISSUED    Transfusion Status OK TO TRANSFUSE    Crossmatch Result Compatible    Unit Number [id number]    Blood Component Type RED CELLS,LR    Unit division 00    Status of Unit ALLOCATED    Transfusion Status OK TO TRANSFUSE    Crossmatch Result Compatible    Unit Number [id number]    Blood Component Type RED CELLS,LR    Unit division 00    Status of Unit ALLOCATED    Transfusion Status OK TO TRANSFUSE    Crossmatch Result      Compatible Performed at [facility], [address], [place], [place] [address]   ABO/Rh     Status: None   Collection Time: 12/20/18  6:58 PM  Result Value Ref Range   ABO/RH(D)      O POS Performed at [facility], [address], [place], [place] [address]   SARS Coronavirus 2 by RT PCR (hospital order, performed in [facility] hospital lab) Nasopharyngeal Nasopharyngeal Swab     Status: None   Collection Time: 12/20/18  7:10 PM   Specimen: Nasopharyngeal Swab  Result Value Ref Range   SARS Coronavirus 2 NEGATIVE NEGATIVE    Comment: (NOTE) If result  is NEGATIVE SARS-CoV-2 target nucleic acids are NOT DETECTED. The SARS-CoV-2 RNA is generally detectable in upper and lower  respiratory specimens during the acute phase of infection. The lowest  concentration of SARS-CoV-2 viral copies this assay can detect is 250  copies / mL. A negative result does not preclude SARS-CoV-2 infection  and should not be used as the sole basis for treatment or other  patient management decisions.  A negative result may occur with  improper specimen collection / handling, submission of specimen other  than nasopharyngeal swab, presence of viral mutation(s) within the  areas targeted by this assay, and inadequate number of viral copies  (<250 copies / mL). A  negative result must be combined with clinical  observations, patient history, and epidemiological information. If result is POSITIVE SARS-CoV-2 target nucleic acids are DETECTED. The SARS-CoV-2 RNA is generally detectable in upper and lower  respiratory specimens dur ing the acute phase of infection.  Positive  results are indicative of active infection with SARS-CoV-2.  Clinical  correlation with patient history and other diagnostic information is  necessary to determine patient infection status.  Positive results do  not rule out bacterial infection or co-infection with other viruses. If result is PRESUMPTIVE POSTIVE SARS-CoV-2 nucleic acids MAY BE PRESENT.   A presumptive positive result was obtained on the submitted specimen  and confirmed on repeat testing.  While 2019 novel coronavirus  (SARS-CoV-2) nucleic acids may be present in the submitted sample  additional confirmatory testing may be necessary for epidemiological  and / or clinical management purposes  to differentiate between  SARS-CoV-2 and other Sarbecovirus currently known to infect humans.  If clinically indicated additional testing with an alternate test  methodology [phone]) is advised. The SARS-CoV-2 RNA is generally  detectable in upper and lower respiratory sp ecimens during the acute  phase of infection. The expected result is Negative. Fact Sheet for Patients:  BoilerBrush.com.cy Fact Sheet for Healthcare Providers: https://pope.com/ This test is not yet approved or cleared by the [place] FDA and has been authorized for detection and/or diagnosis of SARS-CoV-2 by FDA under an Emergency Use Authorization (EUA).  This EUA will remain in effect (meaning this test can be used) for the duration of the COVID-19 declaration under Section 564(b)(1) of the Act, 21 U.S.C. section 360bbb-3(b)(1), unless the authorization is terminated or revoked sooner. Performed  at [facility], [address] [place], [place] [address]   I-stat chem 8, ED (not at [facility] or [facility])     Status: Abnormal   Collection Time: 12/20/18  7:18 PM  Result Value Ref Range   Sodium 131 (L) 135 - 145 mmol/L   Potassium 4.4 3.5 - 5.1 mmol/L   Chloride 99 98 - 111 mmol/L   BUN 15 8 - 23 mg/dL   Creatinine, Ser 9.56 0.44 - 1.00 mg/dL   Glucose, Bld 213 (H) 70 - 99 mg/dL   Calcium, Ion 0.86 5.78 - 1.40 mmol/L   TCO2 21 (L) 22 - 32 mmol/L   Hemoglobin 9.5 (L) 12.0 - 15.0 g/dL   HCT 46.9 (L) 62.9 - 52.8 %  Prepare RBC     Status: None   Collection Time: 12/20/18  8:54 PM  Result Value Ref Range   Order Confirmation      ORDER PROCESSED BY BLOOD BANK Performed at [facility], [address], [place], [place] [address]   Troponin I (High Sensitivity)     Status: Abnormal   Collection  Time: 12/20/18  8:55 PM  Result Value Ref Range   Troponin I (High Sensitivity) 492 (HH) <18 ng/L    Comment: CRITICAL VALUE NOTED.  VALUE IS CONSISTENT WITH PREVIOUSLY REPORTED AND CALLED VALUE. Performed at [facility], [address] [place], [place] [address]   Prepare RBC     Status: None   Collection Time: 12/20/18 10:00 PM  Result Value Ref Range   Order Confirmation      BB SAMPLE OR UNITS ALREADY AVAILABLE Performed at [facility], [address] [place], [place] [address]   Prepare fresh frozen plasma     Status: None (Preliminary result)   Collection Time: 12/20/18 11:18 PM  Result Value Ref Range   Unit Number [id number]    Blood Component Type THW PLS APHR    Unit division 00    Status of Unit ALLOCATED    Transfusion Status OK TO TRANSFUSE    Unit Number [id number]    Blood Component Type THW PLS APHR    Unit division A0    Status of Unit ALLOCATED    Transfusion Status OK TO TRANSFUSE    Unit Number [id number]    Blood Component Type THAWED PLASMA    Unit division 00    Status of Unit ISSUED    Transfusion Status OK TO TRANSFUSE     Unit Number [id number]    Blood Component Type THW PLS APHR    Unit division 00    Status of Unit ISSUED    Transfusion Status OK TO TRANSFUSE   I-STAT, chem 8     Status: Abnormal   Collection Time: 12/21/18 12:14 AM  Result Value Ref Range   Sodium 133 (L) 135 - 145 mmol/L   Potassium 4.9 3.5 - 5.1 mmol/L   Chloride 102 98 - 111 mmol/L   BUN 18 8 - 23 mg/dL   Creatinine, Ser 7.561.00 0.44 - 1.00 mg/dL   Glucose, Bld 433172 (H) 70 - 99 mg/dL   Calcium, Ion 2.951.18 1.881.15 - 1.40 mmol/L   TCO2 21 (L) 22 - 32 mmol/L   Hemoglobin 9.5 (L) 12.0 - 15.0 g/dL   HCT 41.628.0 (L) 60.636.0 - 30.146.0 %  I-STAT 7, (LYTES, BLD GAS, ICA, H+H)     Status: Abnormal   Collection Time: 12/21/18 12:25 AM  Result Value Ref Range   pH, Arterial 7.287 (L) 7.350 - 7.450   pCO2 arterial 46.6 32.0 - 48.0 mmHg   pO2, Arterial 352.0 (H) 83.0 - 108.0 mmHg   Bicarbonate 22.2 20.0 - 28.0 mmol/L   TCO2 24 22 - 32 mmol/L   O2 Saturation 100.0 %   Acid-base deficit 4.0 (H) 0.0 - 2.0 mmol/L   Sodium 134 (L) 135 - 145 mmol/L   Potassium 4.9 3.5 - 5.1 mmol/L   Calcium, Ion 1.01 (L) 1.15 - 1.40 mmol/L   HCT 26.0 (L) 36.0 - 46.0 %   Hemoglobin 8.8 (L) 12.0 - 15.0 g/dL   Patient temperature HIDE    Sample type CARDIOPULMONARY BYPASS   I-STAT, chem 8     Status: Abnormal   Collection Time: 12/21/18 12:32 AM  Result Value Ref Range   Sodium 134 (L) 135 - 145 mmol/L   Potassium 4.9 3.5 - 5.1 mmol/L   Chloride 98 98 - 111 mmol/L   BUN 17 8 - 23 mg/dL   Creatinine, Ser 6.010.90 0.44 - 1.00 mg/dL   Glucose, Bld 093174 (H) 70 - 99 mg/dL  Calcium, Ion 1.02 (L) 1.15 - 1.40 mmol/L   TCO2 22 22 - 32 mmol/L   Hemoglobin 9.9 (L) 12.0 - 15.0 g/dL   HCT 40.9 (L) 81.1 - 91.4 %  I-STAT, chem 8     Status: Abnormal   Collection Time: 12/21/18  1:19 AM  Result Value Ref Range   Sodium 135 135 - 145 mmol/L   Potassium 4.7 3.5 - 5.1 mmol/L   Chloride 100 98 - 111 mmol/L   BUN 18 8 - 23 mg/dL   Creatinine, Ser 7.82 0.44 - 1.00 mg/dL   Glucose,  Bld 956 (H) 70 - 99 mg/dL   Calcium, Ion 2.13 (L) 1.15 - 1.40 mmol/L   TCO2 25 22 - 32 mmol/L   Hemoglobin 8.8 (L) 12.0 - 15.0 g/dL   HCT 08.6 (L) 57.8 - 46.9 %  I-STAT 7, (LYTES, BLD GAS, ICA, H+H)     Status: Abnormal   Collection Time: 12/21/18  2:46 AM  Result Value Ref Range   pH, Arterial 7.121 (LL) 7.350 - 7.450   pCO2 arterial 65.1 (HH) 32.0 - 48.0 mmHg   pO2, Arterial 177.0 (H) 83.0 - 108.0 mmHg   Bicarbonate 21.2 20.0 - 28.0 mmol/L   TCO2 23 22 - 32 mmol/L   O2 Saturation 99.0 %   Acid-base deficit 8.0 (H) 0.0 - 2.0 mmol/L   Sodium 132 (L) 135 - 145 mmol/L   Potassium 5.7 (H) 3.5 - 5.1 mmol/L   Calcium, Ion 1.14 (L) 1.15 - 1.40 mmol/L   HCT 25.0 (L) 36.0 - 46.0 %   Hemoglobin 8.5 (L) 12.0 - 15.0 g/dL   Patient temperature HIDE    Sample type CARDIOPULMONARY BYPASS   I-STAT, chem 8     Status: Abnormal   Collection Time: 12/21/18  3:05 AM  Result Value Ref Range   Sodium 133 (L) 135 - 145 mmol/L   Potassium 4.8 3.5 - 5.1 mmol/L   Chloride 98 98 - 111 mmol/L   BUN 19 8 - 23 mg/dL   Creatinine, Ser 6.29 0.44 - 1.00 mg/dL   Glucose, Bld 528 (H) 70 - 99 mg/dL   Calcium, Ion 4.13 (L) 1.15 - 1.40 mmol/L   TCO2 23 22 - 32 mmol/L   Hemoglobin 8.2 (L) 12.0 - 15.0 g/dL   HCT 24.4 (L) 01.0 - 27.2 %  Hemoglobin and hematocrit, blood     Status: Abnormal   Collection Time: 12/21/18  3:07 AM  Result Value Ref Range   Hemoglobin 6.8 (LL) 12.0 - 15.0 g/dL    Comment: REPEATED TO VERIFY THIS CRITICAL RESULT HAS VERIFIED AND BEEN CALLED TO K.MCCARTY,RN BY MELISSA BROGDON ON 10 25 2020 AT 0317, AND HAS BEEN READ BACK.     HCT 22.0 (L) 36.0 - 46.0 %    Comment: Performed at [facility], [address], [place], [place] [address]  Platelet count     Status: Abnormal   Collection Time: 12/21/18  3:07 AM  Result Value Ref Range   Platelets 56 (L) 150 - 400 K/uL    Comment: REPEATED TO VERIFY PLATELET COUNT CONFIRMED BY SMEAR SPECIMEN CHECKED FOR CLOTS Immature Platelet  Fraction may be clinically indicated, consider ordering this additional test [id number] RESULT CALLED TO, READ BACK BY AND VERIFIED WITH: K.MCCARTY,RN 9563 12/21/2018 M.CAMPBELL Performed at [facility], [address] [place], [place] [address]   Fibrinogen     Status: None   Collection Time: 12/21/18  3:07 AM  Result Value Ref Range   Fibrinogen 373 210 - 475 mg/dL    Comment: RESULT CALLED TO, READ BACK BY AND VERIFIED WITH: Melton Krebs, RN AT [id number] ON 12/21/2018 BY Carolyn Rodriguez S Performed at [facility], [address] [place], [place] [address]   Prepare platelet pheresis     Status: None (Preliminary result)   Collection Time: 12/21/18  3:24 AM  Result Value Ref Range   Unit Number [id number]    Blood Component Type PLTP LR2 PAS    Unit division 00    Status of Unit ISSUED    Transfusion Status OK TO TRANSFUSE    Unit Number [id number]    Blood Component Type PLTP LR2 PAS    Unit division 00    Status of Unit ISSUED    Transfusion Status      OK TO TRANSFUSE Performed at [facility], [address], [place], [place] [address]   I-STAT 7, (LYTES, BLD GAS, ICA, H+H)     Status: Abnormal   Collection Time: 12/21/18  3:48 AM  Result Value Ref Range   pH, Arterial 7.373 7.350 - 7.450   pCO2 arterial 31.9 (L) 32.0 - 48.0 mmHg   pO2, Arterial 333.0 (H) 83.0 - 108.0 mmHg   Bicarbonate 18.6 (L) 20.0 - 28.0 mmol/L   TCO2 20 (L) 22 - 32 mmol/L   O2 Saturation 100.0 %   Acid-base deficit 6.0 (H) 0.0 - 2.0 mmol/L   Sodium 133 (L) 135 - 145 mmol/L   Potassium 4.9 3.5 - 5.1 mmol/L   Calcium, Ion 1.01 (L) 1.15 - 1.40 mmol/L   HCT 25.0 (L) 36.0 - 46.0 %   Hemoglobin 8.5 (L) 12.0 - 15.0 g/dL   Patient temperature HIDE    Sample type CARDIOPULMONARY BYPASS   I-STAT, chem 8     Status: Abnormal   Collection Time: 12/21/18  4:01 AM  Result Value Ref Range   Sodium 135 135 - 145 mmol/L   Potassium 4.7 3.5 - 5.1 mmol/L   Chloride 97 (L) 98 - 111 mmol/L    BUN 20 8 - 23 mg/dL   Creatinine, Ser 1.02 0.44 - 1.00 mg/dL   Glucose, Bld 725 (H) 70 - 99 mg/dL   Calcium, Ion 3.66 (L) 1.15 - 1.40 mmol/L   TCO2 24 22 - 32 mmol/L   Hemoglobin 8.5 (L) 12.0 - 15.0 g/dL   HCT 44.0 (L) 34.7 - 42.5 %  I-STAT 7, (LYTES, BLD GAS, ICA, H+H)     Status: Abnormal   Collection Time: 12/21/18  4:33 AM  Result Value Ref Range   pH, Arterial 7.306 (L) 7.350 - 7.450   pCO2 arterial 46.4 32.0 - 48.0 mmHg   pO2, Arterial 130.0 (H) 83.0 - 108.0 mmHg   Bicarbonate 23.2 20.0 - 28.0 mmol/L   TCO2 25 22 - 32 mmol/L   O2 Saturation 99.0 %   Acid-base deficit 3.0 (H) 0.0 - 2.0 mmol/L   Sodium 136 135 - 145 mmol/L   Potassium 4.5 3.5 - 5.1 mmol/L   Calcium, Ion 1.04 (L) 1.15 - 1.40 mmol/L   HCT 22.0 (L) 36.0 - 46.0 %   Hemoglobin 7.5 (L) 12.0 - 15.0 g/dL   Patient temperature HIDE    Sample type CARDIOPULMONARY BYPASS   I-STAT, chem 8     Status: Abnormal   Collection Time: 12/21/18  4:43 AM  Result Value Ref Range   Sodium 137 135 - 145 mmol/L  Potassium 4.5 3.5 - 5.1 mmol/L   Chloride 99 98 - 111 mmol/L   BUN 20 8 - 23 mg/dL   Creatinine, Ser 5.17 0.44 - 1.00 mg/dL   Glucose, Bld 001 (H) 70 - 99 mg/dL   Calcium, Ion 7.49 (L) 1.15 - 1.40 mmol/L   TCO2 23 22 - 32 mmol/L   Hemoglobin 8.8 (L) 12.0 - 15.0 g/dL   HCT 44.9 (L) 67.5 - 91.6 %  I-STAT, chem 8     Status: Abnormal   Collection Time: 12/21/18  5:39 AM  Result Value Ref Range   Sodium 139 135 - 145 mmol/L   Potassium 4.2 3.5 - 5.1 mmol/L   Chloride 99 98 - 111 mmol/L   BUN 18 8 - 23 mg/dL   Creatinine, Ser 3.84 0.44 - 1.00 mg/dL   Glucose, Bld 665 (H) 70 - 99 mg/dL   Calcium, Ion 9.93 (L) 1.15 - 1.40 mmol/L   TCO2 27 22 - 32 mmol/L   Hemoglobin 10.2 (L) 12.0 - 15.0 g/dL   HCT 57.0 (L) 17.7 - 93.9 %  CBC     Status: Abnormal   Collection Time: 12/21/18  6:37 AM  Result Value Ref Range   WBC 20.4 (H) 4.0 - 10.5 K/uL   RBC 4.21 3.87 - 5.11 MIL/uL   Hemoglobin 10.1 (L) 12.0 - 15.0 g/dL     Comment: REPEATED TO VERIFY POST TRANSFUSION SPECIMEN    HCT 32.4 (L) 36.0 - 46.0 %   MCV 77.0 (L) 80.0 - 100.0 fL   MCH 24.0 (L) 26.0 - 34.0 pg   MCHC 31.2 30.0 - 36.0 g/dL   RDW 03.0 (H) 09.2 - 33.0 %   Platelets 142 (L) 150 - 400 K/uL    Comment: REPEATED TO VERIFY POST TRANSFUSION SPECIMEN    nRBC 0.1 0.0 - 0.2 %    Comment: Performed at [facility], [address], [place], [place] [address]  Protime-INR     Status: Abnormal   Collection Time: 12/21/18  6:37 AM  Result Value Ref Range   Prothrombin Time 23.3 (H) 11.4 - 15.2 seconds   INR 2.1 (H) 0.8 - 1.2    Comment: (NOTE) INR goal varies based on device and disease states. Performed at [facility], [address], [place], [place] [address]   APTT     Status: Abnormal   Collection Time: 12/21/18  6:37 AM  Result Value Ref Range   aPTT 94 (H) 24 - 36 seconds    Comment:        IF BASELINE aPTT IS ELEVATED, SUGGEST PATIENT RISK ASSESSMENT BE USED TO DETERMINE APPROPRIATE ANTICOAGULANT THERAPY. Performed at [facility], [address] [place], [place] [address]   Basic metabolic panel     Status: Abnormal   Collection Time: 12/21/18  6:37 AM  Result Value Ref Range   Sodium 141 135 - 145 mmol/L   Potassium 3.9 3.5 - 5.1 mmol/L   Chloride 110 98 - 111 mmol/L   CO2 25 22 - 32 mmol/L   Glucose, Bld 101 (H) 70 - 99 mg/dL   BUN 15 8 - 23 mg/dL   Creatinine, Ser 6.38 0.44 - 1.00 mg/dL   Calcium 6.2 (LL) 8.9 - 10.3 mg/dL    Comment: CRITICAL RESULT CALLED TO, READ BACK BY AND VERIFIED WITH: [facility] HARRISON RN AT 0750 12/21/2018 BY WOOLLENK    GFR calc non Af Amer >60 >60 mL/min   GFR  calc Af Amer >60 >60 mL/min   Anion gap 6 5 - 15    Comment: Performed at [facility], [address], [place], [place] [address]  Magnesium     Status: None   Collection Time: 12/21/18  6:37 AM  Result Value Ref Range   Magnesium 2.1 1.7 - 2.4 mg/dL    Comment: Performed at [facility], [address] [place], [place] [address]  I-STAT 7, (LYTES, BLD GAS, ICA, H+H)     Status: Abnormal   Collection Time: 12/21/18  6:58 AM  Result Value Ref Range   pH, Arterial 7.290 (L) 7.350 - 7.450   pCO2 arterial 56.8 (H) 32.0 - 48.0 mmHg   pO2, Arterial 95.0 83.0 - 108.0 mmHg   Bicarbonate 27.5 20.0 - 28.0 mmol/L   TCO2 29 22 - 32 mmol/L   O2 Saturation 96.0 %   Sodium 140 135 - 145 mmol/L   Potassium 4.2 3.5 - 5.1 mmol/L   Calcium, Ion 1.09 (L) 1.15 - 1.40 mmol/L   HCT 32.0 (L) 36.0 - 46.0 %   Hemoglobin 10.9 (L) 12.0 - 15.0 g/dL   Patient temperature 86.5 C    Sample type CARDIOPULMONARY BYPASS   Glucose, capillary     Status: Abnormal   Collection Time: 12/21/18  7:59 AM  Result Value Ref Range   Glucose-Capillary 56 (L) 70 - 99 mg/dL  Glucose, capillary     Status: Abnormal   Collection Time: 12/21/18  8:00 AM  Result Value Ref Range   Glucose-Capillary 64 (L) 70 - 99 mg/dL    ECG   pending - Personally Reviewed  Telemetry   Paced at 90 - Personally Reviewed  Radiology    Dg Chest Port 1 View  Result Date: 12/21/2018 CLINICAL DATA:  Postop aortic aneurysm repair EXAM: PORTABLE CHEST 1 VIEW COMPARISON:  12/20/2018 FINDINGS: Endotracheal tube in good position. Swan-Ganz catheter in the main pulmonary artery. Bilateral chest tubes in place. No pneumothorax Left lower lobe atelectasis. Right perihilar atelectasis. Possible small left effusion. NG tube in the stomach. IMPRESSION: Support lines in good position. No pneumothorax postop median sternotomy. Bibasilar atelectasis left greater than right. Electronically Signed   By: Marlan Palau M.D.   On: 12/21/2018 07:54   Dg Chest Portable 1 View  Result Date: 12/20/2018 CLINICAL DATA:  Chest pain EXAM: PORTABLE CHEST 1 VIEW COMPARISON:  None. FINDINGS: There is mild cardiomegaly. Mildly increased interstitial markings seen within the left mid lung and left lower lung. Streaky atelectasis seen at the right lung base. No pleural  effusion. No acute osseous abnormality. IMPRESSION: Nonspecific increased interstitial markings at the left lower lung. This could be due to atelectasis, chronic lung changes, and/or early infectious etiology. Electronically Signed   By: Jonna Clark M.D.   On: 12/20/2018 19:17   Ct Angio Chest/abd/pel For Dissection W And/or Wo Contrast  Result Date: 12/20/2018 CLINICAL DATA:  85 year old female with ongoing chest pain radiating to her shoulder blade. New onset of shortness of breath. EXAM: CT ANGIOGRAPHY CHEST, ABDOMEN AND PELVIS TECHNIQUE: Multidetector CT imaging through the chest, abdomen and pelvis was performed using the standard protocol during bolus administration of intravenous contrast. Multiplanar reconstructed images and MIPs were obtained and reviewed to evaluate the vascular anatomy. CONTRAST:  OMNIPAQUE IOHEXOL 350 MG/ML SOLN COMPARISON:  Chest radiograph dated 12/20/2018. FINDINGS: CTA CHEST FINDINGS Cardiovascular: There is no cardiomegaly. There is a large pericardial effusion measuring up to 3.7 cm in  thickness anterior to the heart. There is associated slight mass effect and flattening of the anterior wall of the right ventricle. Higher attenuation of the pericardial fluid most consistent with hemopericardium. They are is a crescentic high attenuating density starting from the aortic root and extending to the proximal aortic arch most consistent with intramural hematoma. This appears to extend into the brachiocephalic trunk. The visualized origins of the great vessels of the aortic arch otherwise remain patent. There is a penetrating ulcer or a pseudoaneurysm along the medial wall of the ascending aorta measuring approximately 1 cm in depth and 3 cm in length (coronal series 11, image 67 and axial series 8, image 50). There is a focal area of heterogeneous density and opacification involving the proximal descending thoracic aorta (series 8, image 80 and coronal series 11 images  107-115). This may represent an area of intimal injury, or developing clot. No definite extraluminal contrast identified. Mediastinum/Nodes: There is no hilar or mediastinal adenopathy. The esophagus and the thyroid gland are grossly unremarkable. Lungs/Pleura: Trace bilateral pleural effusions. There are bibasilar subsegmental atelectasis as well as diffuse linear and hazy densities throughout the lungs most consistent with atelectatic changes or areas of air trapping. No consolidative changes. No pneumothorax. The central airways are patent. Musculoskeletal: There is degenerative changes of the spine. Multilevel disc desiccation and vacuum phenomena. No acute osseous pathology. Review of the MIP images confirms the above findings. CTA ABDOMEN AND PELVIS FINDINGS VASCULAR Aorta: Mild atherosclerotic calcification. No aneurysmal dilatation or dissection. Celiac: Patent without evidence of aneurysm, dissection, vasculitis or significant stenosis. SMA: Patent without evidence of aneurysm, dissection, vasculitis or significant stenosis. Renals: Both renal arteries are patent without evidence of aneurysm, dissection, vasculitis, fibromuscular dysplasia or significant stenosis. IMA: Patent without evidence of aneurysm, dissection, vasculitis or significant stenosis. Inflow: Patent without evidence of aneurysm, dissection, vasculitis or significant stenosis. Veins: No obvious venous abnormality within the limitations of this arterial phase study. Review of the MIP images confirms the above findings. NON-VASCULAR No intra-abdominal free air or free fluid. Hepatobiliary: The liver and gallbladder appear unremarkable. Pancreas: Unremarkable. No pancreatic ductal dilatation or surrounding inflammatory changes. Spleen: Normal in size without focal abnormality. Adrenals/Urinary Tract: The adrenal glands are unremarkable. Minimal fullness of the left renal collecting systems versus parapelvic cyst. The right kidney is  unremarkable. The visualized ureters appear unremarkable. The urinary bladder is collapsed. Stomach/Bowel: There is no bowel obstruction or active inflammation. The appendix is not visualized with certainty. No inflammatory changes identified in the right lower quadrant. Lymphatic: No adenopathy. Reproductive: Hysterectomy. No pelvic mass. Other: None Musculoskeletal: Osteopenia with multilevel degenerative changes of the spine. Multilevel disc desiccation and vacuum phenomena. No acute osseous pathology. Review of the MIP images confirms the above findings. IMPRESSION: 1. Findings most consistent with active intramural hemorrhage involving the aortic root, ascending aorta and proximal aortic arch with a large hemopericardium. Clinical correlation is recommended to evaluate for cardiac tamponade. 2. Focal penetrating ulcer or pseudoaneurysm of the medial wall of the distal ascending aorta. 3. Focal heterogeneity of intraluminal contrast in the proximal descending thoracic aorta may represent underlying intimal injury. 4. No acute intra-abdominal or pelvic pathology. These results were called by telephone at the time of interpretation on 12/20/2018 at 8:00 pm to provider Burt Knack, who verbally acknowledged these results. Electronically Signed   By: Anner Crete M.D.   On: 12/20/2018 20:36    Cardiac Studies   N/A  Assessment   Active Problems:   S/P aortic aneurysm repair  Dissecting aneurysm of thoracic aorta, Stanford type A (HCC)   Plan   1. Appears clinically stable after ruptured Type A aortic aneurysm with hemopericardium. Given blood and platelets. In a paced rhythm at 90, but requiring a low dose of neosynephrine. Would get an echo to assess LV function and status of pericardial space tomorrow. Check FLP in am tomorrow. TSH normal on replacement. Hypoglycemic this am - but admit glucose elevated, check A1c in am tomorrow.  Time Spent Directly with Patient:  I have spent a total of 35  minutes with the patient reviewing hospital notes, telemetry, EKGs, labs and examining the patient as well as establishing an assessment and plan that was discussed personally with the patient.  > 50% of time was spent in direct patient care.  Length of Stay:  LOS: 0 days   Chrystie Nose, MD, [facility], FACP  [place]   [facility]  Medical Director of the [facility] Lipid Disorders &  Cardiovascular Risk Reduction Clinic Diplomate of the American Board of Clinical Lipidology Attending Cardiologist  Direct Dial: [phone]   Fax: [phone]  Website:  [email or web address]..[email or web address] Nicely Edvardo Honse 12/21/2018, 8:42 AM

## 2018-12-21 NOTE — Op Note (Signed)
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  Surgeon: B. Lorayne MarekZane Bliss Tsang, MD  Assistant: T. Asa Lenteonte, PA-C  Anesthesia: get  Operative Findings:  Preserved left ventricular systolic function  Intimal tear of distal ascending aorta just anterior and proximal to origin of left CCA; intense inflammation and induration of peri-aortic tissues abutting Pulm artery, suggesting subacute process  Normal trileaflet aortic valve    BRIEF CLINICAL NOTE AND INDICATIONS FOR SURGERY  85 yo lady previous good health has experienced 1 week of subtle symptoms of malaise and fatigue.  These were acutely worse on 12/20/2018.  [facility] department that evening for evaluation.  Work-up suggested myocardial infarction and assessment showed large pericardial effusion.  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  She is taken to the operating room in the early morning hours of 12/21/2022 replacement of the ascending aorta.    DETAILS OF THE OPERATIVE PROCEDURE  Preparation:  The patient is brought to the operating room on the above mentioned date and central monitoring was established by the anesthesia team including placement of Swan-Ganz catheter and radial arterial line. The patient is placed in the supine position on the operating table.  Intravenous antibiotics are administered. General endotracheal anesthesia  is induced with resulting hemodynamic collapse requiring intravenous epinephrine and approximately 2 minutes of chest compressions for return of acceptable hemodynamics. A Foley catheter is placed.  Cerebral saturations were also monitored. Baseline transesophageal echocardiogram was performed.  Findings were notable for revealed aortic regurgitation and a large pericardial effusion.  The patient's chest, abdomen, both groins, and both lower extremities are prepared and draped in a sterile manner. A time out procedure is performed.   Surgical Approach:  An incision overlying the right deltopectoral groove and dissection was carried down through the subcutaneous tissue pectoralis major and pectoralis minor muscles with electrocautery.  The right axillary artery was encountered and encircled.  5000 units of heparin were given intravenously.  An 8 mm dacryon graft was brought onto the field and sewn end-to-side to the axillary artery which was not dissected.  Was done with a running suture of 5-0 Prolene.  This was then de-aired and attached to the arterial limb of the cardiopulmonary bypass instrument.  Next percutaneous access was obtained of the right common femoral vein.  A guidewire was advanced into the central circulation under TEE guidance.  Median sternotomy was undertaken.  Once the sternum was divided full dose heparin was given.  A multichannel venous cannula was inserted inserted from the right groin into the central circulation under TEE guidance.  Once the activating clotting time was found to be greater than 400 seconds she was placed on cardiopulmonary bypass.  Immediate cooling was undertaken once full flows on heart-lung machine were established to a goal temperature 25 degrees C bladder temperature.   Extracorporeal Cardiopulmonary Bypass and Myocardial Protection:  After going on bypass, the pericardium is opened. The ascending aorta is enlarged and severely ecchymotic in appearance.  A  retrograde cardioplegia cannula is placed through the right atrium into the coronary sinus.  Left ventricular vent was inserted through the right superior pulmonary vein.    The ascending aorta is circumferentially mobilized.  This allows placement of an aortic cross clamp which is applied and cold blood cardioplegia is delivered initially in a retro-grade fashion the ascending aorta is opened..  Iced saline slush is applied for topical hypothermia.  Additional cardioplegia is given directly into the coronary ostia with a hand-held instrument.  Repeat doses of cardioplegia are administered intermittently throughout the entire cross clamp portion of the operation through the coronary sinus catheter, and the through coronary ostia to maintain completely flat electrocardiogram. Myocardial protection was felt to be  excellent.   Replacement of aorta:  The ascending aorta is transected down to the level of the sinotubular junction.  Bulky intramural clot is removed.  Saint Jude aortic valve sizers were used to measure the aortic annulus at 23 mm so a 28 mm dacryon graft was brought onto the field and sewn end to end to the native aorta with a running suture of 3-0 Prolene.  This was buttressed on the outside with felt strips.  When this was completed the patient had been cooling for approximately 1 hour.  Attention was then turned to the distal aorta.  Circulatory arrest was undertaken.  The remainder of the ascending aorta was excised to the level of the arch taking care to preserve the orifice of the great vessels.  Of note, the tear in the aorta was seen just proximal to the takeoff of the left carotid artery.  The tear was not involving any aspect of the arch.  The proximal innominate artery and the proximal common carotid artery on the left were both clamped to allow antegrade cerebral perfusion via the right axillary artery cannulation.  This resulted in improved cerebral saturations.  A 32 mm dacryon graft  was brought onto the field and sewn end to end to the residual portion of the ascending aorta.  This was done with a running suture of 3-0 Prolene.  When this was completed, the graft itself was clamped and flow to the body was restored.  Circulatory arrest time at a temperature of approximately 25 degrees was 54 minutes with 53 minutes of antegrade cerebral perfusion.  Next, graft to graft anastomosis was done with a running suture of 4-0 Prolene.  Aggressive rewarming was undertaken.  A warm shot dose of cardioplegia was given and the aortic cross-clamp was removed after de-airing procedures were performed.  The total cross-clamp time was 156 minutes.   Procedure Completion:  . Epicardial pacing wires are fixed to the right ventricular outflow tract and to the right atrial appendage. The patient is rewarmed to 36C temperature. The patient is weaned and disconnected from cardiopulmonary bypass.  The patient's rhythm at separation from bypass was sinus bradycardia.  The patient was weaned from cardiopulmonary bypass  without any inotropic support. Total cardiopulmonary bypass time for the operation was 249 minutes.  Followup transesophageal echocardiogram performed after separation from bypass revealed no changes from the preoperative exam.  The right axillary artery and femoral venous venous cannulae were removed uneventfully. Protamine was administered to reverse the anticoagulation. The mediastinum and pleural space were inspected for hemostasis and irrigated with saline solution. The mediastinum and right pleural space were drained using fluted chest tubes placed through separate stab incisions inferiorly.  The soft tissues anterior to the aorta were reapproximated loosely. The sternum is closed in a rigid reconstruction fashion with an linear plates on each side of the  sternum and double strength sternal wire. The soft tissues anterior to the sternum were closed in multiple layers and the skin is  closed with a running subcuticular skin closure. The post-bypass portion of the operation was notable for stable rhythm and hemodynamics.    Disposition:  The patient tolerated the procedure well and is transported to the surgical intensive care in critical but stable condition. There are no intraoperative complications. All sponge instrument and needle counts are verified correct at completion of the operation.    Brantley Fling, MD 12/21/2018 5:57 PM

## 2018-12-21 NOTE — Transfer of Care (Signed)
Immediate Anesthesia Transfer of Care Note  Patient: Kaitlyn Rodriguez  Procedure(s) Performed: REPAIR OF TYPE A AORTIC DISSECTION USING HEMASHIELD PLATINUM 28MM AND 32MM GRAFT (N/A ) STERNAL PLATING  Patient Location: SICU  Anesthesia Type:General  Level of Consciousness: sedated  Airway & Oxygen Therapy: Patient remains intubated per anesthesia plan and Patient placed on Ventilator (see vital sign flow sheet for setting)  Post-op Assessment: Report given to RN and Post -op Vital signs reviewed and stable  Post vital signs: Reviewed and stable  Last Vitals:  Vitals Value Taken Time  BP 79/61 12/21/18 0632  Temp    Pulse 89 12/21/18 0633  Resp 15 12/21/18 0633  SpO2 98 % 12/21/18 0633  Vitals shown include unvalidated device data.  Last Pain:  Vitals:   12/20/18 2133  TempSrc: Oral  PainSc:          Complications: No apparent anesthesia complications

## 2018-12-21 NOTE — Progress Notes (Signed)
CRITICAL VALUE ALERT  Critical Value:  Ca 6.2  Date & Time Notied:  12/21/18 0750   Provider Notified: Dr. Orvan Seen  Orders Received/Actions taken: give one amp calcium now

## 2018-12-21 NOTE — Anesthesia Procedure Notes (Signed)
Central Venous Catheter Insertion Performed by: Suzette Battiest, MD, anesthesiologist Start/End10/24/2020 10:40 PM, 12/20/2018 10:55 PM Patient location: Pre-op. Preanesthetic checklist: patient identified, IV checked, site marked, risks and benefits discussed, surgical consent, monitors and equipment checked, pre-op evaluation, timeout performed and anesthesia consent Hand hygiene performed  and maximum sterile barriers used  PA cath was placed.Swan type:thermodilution Procedure performed without using ultrasound guided technique. Attempts: 1 Patient tolerated the procedure well with no immediate complications.

## 2018-12-21 NOTE — Brief Op Note (Signed)
12/20/2018  5:57 AM  PATIENT:  Kaitlyn Rodriguez  85 y.o. female  PRE-OPERATIVE DIAGNOSIS:  TYPE A AORTIC DISSECTION  POST-OPERATIVE DIAGNOSIS:  TYPE A AORTIC DISSECTION  PROCEDURE:  Procedure(s): REPAIR OF TYPE A AORTIC DISSECTION USING HEMASHIELD PLATINUM 28MM AND 32MM GRAFT (N/A) STERNAL PLATING  SURGEON:  Surgeon(s) and Role:    * Wonda Olds, MD - Primary  PHYSICIAN ASSISTANT: Harriet Pho, T  ASSISTANTS: staff   ANESTHESIA:   general  EBL:  3100 mL   BLOOD ADMINISTERED:2 PLTS  DRAINS: 3 Chest Tube(s) in the mediastinum and right pleural space   LOCAL MEDICATIONS USED:  NONE  SPECIMEN: aorta  DISPOSITION OF SPECIMEN:  PATHOLOGY  COUNTS:  YES  TOURNIQUET:  * No tourniquets in log *  DICTATION: .Note written in EPIC  PLAN OF CARE: Admit to inpatient   PATIENT DISPOSITION:  ICU - intubated and critically ill.   Delay start of Pharmacological VTE agent (>24hrs) due to surgical blood loss or risk of bleeding: yes  Wynona Duhamel Z. Orvan Seen, [place]

## 2018-12-21 NOTE — Anesthesia Procedure Notes (Addendum)
Arterial Line Insertion Start/End10/24/2020 10:30 PM, 12/20/2018 10:30 PM Performed by: Inda Coke, CRNA, CRNA  Preanesthetic checklist: patient identified, IV checked, monitors and equipment checked, pre-op evaluation and timeout performed Emergency situation Lidocaine 1% used for infiltration Right, radial was placed Catheter size: 20 G Hand hygiene performed  and maximum sterile barriers used   Attempts: 1 Procedure performed without using ultrasound guided technique. Following insertion, dressing applied and Biopatch. Post procedure assessment: normal  Patient tolerated the procedure well with no immediate complications.

## 2018-12-22 ENCOUNTER — Inpatient Hospital Stay (HOSPITAL_COMMUNITY): Payer: Medicare Other

## 2018-12-22 ENCOUNTER — Encounter (HOSPITAL_COMMUNITY): Payer: Self-pay | Admitting: Cardiothoracic Surgery

## 2018-12-22 DIAGNOSIS — I340 Nonrheumatic mitral (valve) insufficiency: Secondary | ICD-10-CM

## 2018-12-22 DIAGNOSIS — I351 Nonrheumatic aortic (valve) insufficiency: Secondary | ICD-10-CM

## 2018-12-22 DIAGNOSIS — I314 Cardiac tamponade: Secondary | ICD-10-CM

## 2018-12-22 LAB — BASIC METABOLIC PANEL
Anion gap: 9 (ref 5–15)
Anion gap: 9 (ref 5–15)
BUN: 23 mg/dL (ref 8–23)
BUN: 20 mg/dL (ref 8–23)
CO2: 24 mmol/L (ref 22–32)
CO2: 25 mmol/L (ref 22–32)
Calcium: 8 mg/dL — ABNORMAL LOW (ref 8.9–10.3)
Calcium: 7.8 mg/dL — ABNORMAL LOW (ref 8.9–10.3)
Chloride: 102 mmol/L (ref 98–111)
Chloride: 100 mmol/L (ref 98–111)
Creatinine, Ser: 0.88 mg/dL (ref 0.44–1.00)
Creatinine, Ser: 0.71 mg/dL (ref 0.44–1.00)
GFR calc Af Amer: >60 mL/min (ref >60)
GFR calc Af Amer: >60 mL/min (ref >60)
GFR calc non Af Amer: >60 mL/min (ref >60)
GFR calc non Af Amer: >60 mL/min (ref >60)
Glucose, Bld: 119 mg/dL — ABNORMAL HIGH (ref 70–99)
Glucose, Bld: 129 mg/dL — ABNORMAL HIGH (ref 70–99)
Potassium: 3.8 mmol/L (ref 3.5–5.1)
Potassium: 3.6 mmol/L (ref 3.5–5.1)
Sodium: 135 mmol/L (ref 135–145)
Sodium: 134 mmol/L — ABNORMAL LOW (ref 135–145)

## 2018-12-22 LAB — GLUCOSE, CAPILLARY
Glucose-Capillary: 101 mg/dL — ABNORMAL HIGH (ref 70–99)
Glucose-Capillary: 113 mg/dL — ABNORMAL HIGH (ref 70–99)
Glucose-Capillary: 125 mg/dL — ABNORMAL HIGH (ref 70–99)
Glucose-Capillary: 107 mg/dL — ABNORMAL HIGH (ref 70–99)
Glucose-Capillary: 120 mg/dL — ABNORMAL HIGH (ref 70–99)
Glucose-Capillary: 123 mg/dL — ABNORMAL HIGH (ref 70–99)
Glucose-Capillary: 115 mg/dL — ABNORMAL HIGH (ref 70–99)
Glucose-Capillary: 102 mg/dL — ABNORMAL HIGH (ref 70–99)
Glucose-Capillary: 97 mg/dL (ref 70–99)
Glucose-Capillary: 134 mg/dL — ABNORMAL HIGH (ref 70–99)
Glucose-Capillary: 99 mg/dL (ref 70–99)
Glucose-Capillary: 97 mg/dL (ref 70–99)

## 2018-12-22 LAB — PREPARE PLATELET PHERESIS
Unit division: 0
Unit division: 0

## 2018-12-22 LAB — POCT I-STAT 7, (LYTES, BLD GAS, ICA,H+H)
Acid-base deficit: 8 mmol/L — ABNORMAL HIGH (ref 0.0–2.0)
Bicarbonate: 19.1 mmol/L — ABNORMAL LOW (ref 20.0–28.0)
Calcium, Ion: 1.23 mmol/L (ref 1.15–1.40)
HCT: 25 % — ABNORMAL LOW (ref 36.0–46.0)
Hemoglobin: 8.5 g/dL — ABNORMAL LOW (ref 12.0–15.0)
O2 Saturation: 99 %
Potassium: 4.5 mmol/L (ref 3.5–5.1)
Sodium: 132 mmol/L — ABNORMAL LOW (ref 135–145)
TCO2: 20 mmol/L — ABNORMAL LOW (ref 22–32)
pCO2 arterial: 42.9 mmHg (ref 32.0–48.0)
pH, Arterial: 7.255 — ABNORMAL LOW (ref 7.350–7.450)
pO2, Arterial: 175 mmHg — ABNORMAL HIGH (ref 83.0–108.0)

## 2018-12-22 LAB — BPAM PLATELET PHERESIS
Blood Product Expiration Date: 202010262359
Blood Product Expiration Date: 202010262359
ISSUE DATE / TIME: 202010250329
ISSUE DATE / TIME: 202010250329
Unit Type and Rh: 5100
Unit Type and Rh: 6200

## 2018-12-22 LAB — CBC WITH DIFFERENTIAL/PLATELET
Abs Immature Granulocytes: 0.07 10*3/uL (ref 0.00–0.07)
Basophils Absolute: 0 10*3/uL (ref 0.0–0.1)
Basophils Relative: 0 %
Eosinophils Absolute: 0 10*3/uL (ref 0.0–0.5)
Eosinophils Relative: 0 %
HCT: 28 % — ABNORMAL LOW (ref 36.0–46.0)
Hemoglobin: 8.8 g/dL — ABNORMAL LOW (ref 12.0–15.0)
Immature Granulocytes: 0 %
Lymphocytes Relative: 7 %
Lymphs Abs: 1.1 10*3/uL (ref 0.7–4.0)
MCH: 24 pg — ABNORMAL LOW (ref 26.0–34.0)
MCHC: 31.4 g/dL (ref 30.0–36.0)
MCV: 76.5 fL — ABNORMAL LOW (ref 80.0–100.0)
Monocytes Absolute: 1.4 10*3/uL — ABNORMAL HIGH (ref 0.1–1.0)
Monocytes Relative: 9 %
Neutro Abs: 13.8 10*3/uL — ABNORMAL HIGH (ref 1.7–7.7)
Neutrophils Relative %: 84 %
Platelets: 164 10*3/uL (ref 150–400)
RBC: 3.66 MIL/uL — ABNORMAL LOW (ref 3.87–5.11)
RDW: 20.4 % — ABNORMAL HIGH (ref 11.5–15.5)
WBC: 16.5 10*3/uL — ABNORMAL HIGH (ref 4.0–10.5)
nRBC: 0 % (ref 0.0–0.2)

## 2018-12-22 LAB — POCT I-STAT, CHEM 8
BUN: 18 mg/dL (ref 8–23)
Calcium, Ion: 1.29 mmol/L (ref 1.15–1.40)
Chloride: 101 mmol/L (ref 98–111)
Creatinine, Ser: 1 mg/dL (ref 0.44–1.00)
Glucose, Bld: 195 mg/dL — ABNORMAL HIGH (ref 70–99)
HCT: 28 % — ABNORMAL LOW (ref 36.0–46.0)
Hemoglobin: 9.5 g/dL — ABNORMAL LOW (ref 12.0–15.0)
Potassium: 4.4 mmol/L (ref 3.5–5.1)
Sodium: 133 mmol/L — ABNORMAL LOW (ref 135–145)
TCO2: 21 mmol/L — ABNORMAL LOW (ref 22–32)

## 2018-12-22 LAB — LIPID PANEL
Cholesterol: 86 mg/dL (ref 0–200)
HDL: 22 mg/dL — ABNORMAL LOW (ref >40)
LDL Cholesterol: 52 mg/dL (ref 0–99)
Total CHOL/HDL Ratio: 3.9 RATIO
Triglycerides: 58 mg/dL (ref <150)
VLDL: 12 mg/dL (ref 0–40)

## 2018-12-22 LAB — CBC
HCT: 27.2 % — ABNORMAL LOW (ref 36.0–46.0)
Hemoglobin: 8.4 g/dL — ABNORMAL LOW (ref 12.0–15.0)
MCH: 23.8 pg — ABNORMAL LOW (ref 26.0–34.0)
MCHC: 30.9 g/dL (ref 30.0–36.0)
MCV: 77.1 fL — ABNORMAL LOW (ref 80.0–100.0)
Platelets: 163 10*3/uL (ref 150–400)
RBC: 3.53 MIL/uL — ABNORMAL LOW (ref 3.87–5.11)
RDW: 20.7 % — ABNORMAL HIGH (ref 11.5–15.5)
WBC: 13.9 10*3/uL — ABNORMAL HIGH (ref 4.0–10.5)
nRBC: 0 % (ref 0.0–0.2)

## 2018-12-22 LAB — ECHOCARDIOGRAM COMPLETE
Height: 65 in
Weight: 3608.49 oz

## 2018-12-22 LAB — HEMOGLOBIN A1C
Hgb A1c MFr Bld: 6.3 % — ABNORMAL HIGH (ref 4.8–5.6)
Mean Plasma Glucose: 134.11 mg/dL

## 2018-12-22 LAB — MAGNESIUM: Magnesium: 2.3 mg/dL (ref 1.7–2.4)

## 2018-12-22 MED ORDER — POTASSIUM CHLORIDE CRYS ER 20 MEQ PO TBCR
20.0000 meq | EXTENDED_RELEASE_TABLET | ORAL | Status: AC
  Administered 2018-12-22 (×2): 20 meq via ORAL
  Filled 2018-12-22 (×2): qty 1

## 2018-12-22 MED ORDER — INSULIN ASPART 100 UNIT/ML ~~LOC~~ SOLN
0.0000 [IU] | SUBCUTANEOUS | Status: DC
  Administered 2018-12-23: 2 [IU] via SUBCUTANEOUS
  Administered 2018-12-24: 4 [IU] via SUBCUTANEOUS

## 2018-12-22 MED ORDER — FUROSEMIDE 10 MG/ML IJ SOLN
20.0000 mg | Freq: Once | INTRAMUSCULAR | Status: AC
  Administered 2018-12-22: 20 mg via INTRAVENOUS
  Filled 2018-12-22: qty 2

## 2018-12-22 NOTE — Progress Notes (Signed)
EVENING ROUNDS NOTE :     [address]       [place], [address]             [phone]                 2 Days Post-Op Procedure(s) (LRB): REPAIR OF TYPE A AORTIC DISSECTION USING HEMASHIELD PLATINUM 28MM AND 32MM GRAFT (N/A) STERNAL PLATING   Total Length of Stay:  LOS: 1 day  Events:  Doing well Up chair getting a bath.    BP 122/77   Pulse 84   Temp 97.8 F (36.6 C) (Oral)   Resp 19   Ht 5\' 5"  (1.651 m)   Wt 102.3 kg   SpO2 100%   BMI 37.53 kg/m         . sodium chloride Stopped (12/21/18 1726)  . sodium chloride Stopped (12/22/18 1101)  . sodium chloride 20 mL/hr at 12/21/18 0752  . cefUROXime (ZINACEF)  IV Stopped (12/22/18 1115)  . insulin 1.1 mL/hr at 12/22/18 1500  . lactated ringers    . lactated ringers    . lactated ringers Stopped (12/21/18 1746)    I/O last 3 completed shifts: In: 25700.8 [I.V.:3784.4; Blood:20777.1; IV Piggyback:1139.3] Out: 7867 [Urine:2790; Blood:3100; Chest Tube:809]   CBC Latest Ref Rng & Units 12/22/2018 12/21/2018 12/21/2018  WBC 4.0 - 10.5 K/uL 16.5(H) - -  Hemoglobin 12.0 - 15.0 g/dL 8.8(L) 8.5(L) 8.8(L)  Hematocrit 36.0 - 46.0 % 28.0(L) 25.0(L) 26.0(L)  Platelets 150 - 400 K/uL 164 - -    BMP Latest Ref Rng & Units 12/22/2018 12/21/2018 12/21/2018  Glucose 70 - 99 mg/dL 119(H) - -  BUN 8 - 23 mg/dL 23 - -  Creatinine 0.44 - 1.00 mg/dL 0.88 - -  Sodium 135 - 145 mmol/L 135 139 139  Potassium 3.5 - 5.1 mmol/L 3.8 4.1 4.3  Chloride 98 - 111 mmol/L 102 - -  CO2 22 - 32 mmol/L 24 - -  Calcium 8.9 - 10.3 mg/dL 8.0(L) - -    ABG    Component Value Date/Time   PHART 7.349 (L) 12/21/2018 1304   PCO2ART 46.5 12/21/2018 1304   PO2ART 60.0 (L) 12/21/2018 1304   HCO3 25.8 12/21/2018 1304   TCO2 27 12/21/2018 1304   ACIDBASEDEF 1.0 12/21/2018 1214   O2SAT 90.0 12/21/2018 [place], MD 12/22/2018 4:56 PM

## 2018-12-22 NOTE — Progress Notes (Signed)
  Echocardiogram 2D Echocardiogram has been performed.  Kaitlyn Rodriguez 12/22/2018, 2:57 PM

## 2018-12-22 NOTE — Progress Notes (Signed)
2 Days Post-Op Procedure(s) (LRB): REPAIR OF TYPE A AORTIC DISSECTION USING HEMASHIELD PLATINUM 28MM AND 32MM GRAFT (N/A) STERNAL PLATING Subjective: Feeling well  Objective: Vital signs in last 24 hours: Temp:  [96.8 F (36 C)-98.6 F (37 C)] 98.6 F (37 C) (10/26 1100) Pulse Rate:  [71-94] 92 (10/26 0700) Cardiac Rhythm: Normal sinus rhythm (10/26 0400) Resp:  [8-26] 14 (10/26 0700) BP: (92-151)/(66-97) 122/80 (10/26 0700) SpO2:  [91 %-98 %] 97 % (10/26 0700) Arterial Line BP: (107-174)/(57-83) 118/58 (10/26 0700) Weight:  [102.3 kg] 102.3 kg (10/26 0500)  Hemodynamic parameters for last 24 hours:    Intake/Output from previous day: 10/25 0701 - 10/26 0700 In: 20308.8 [I.V.:784.4; Blood:18885.1; IV Piggyback:639.3] Out: 2275 [Urine:1485; Chest Tube:790] Intake/Output this shift: No intake/output data recorded.  General appearance: alert, cooperative and no distress Neurologic: intact Heart: regular rate and rhythm, S1, S2 normal, no murmur, click, rub or gallop Lungs: clear to auscultation bilaterally Extremities: edema 1+ Wound: dressed, dry  Lab Results: Recent Labs    12/21/18 1158  12/21/18 1304 12/22/18 0733  WBC 12.6*  --   --  16.5*  HGB 8.9*   < > 8.5* 8.8*  HCT 28.6*   < > 25.0* 28.0*  PLT 125*  --   --  164   < > = values in this interval not displayed.   BMET:  Recent Labs    12/21/18 1158  12/21/18 1304 12/22/18 0733  NA 137   < > 139 135  K 4.4   < > 4.1 3.8  CL 105  --   --  102  CO2 23  --   --  24  GLUCOSE 133*  --   --  119*  BUN 22  --   --  23  CREATININE 1.07*  --   --  0.88  CALCIUM 7.7*  --   --  8.0*   < > = values in this interval not displayed.    PT/INR:  Recent Labs    12/21/18 0637  LABPROT 23.3*  INR 2.1*   ABG    Component Value Date/Time   PHART 7.349 (L) 12/21/2018 1304   HCO3 25.8 12/21/2018 1304   TCO2 27 12/21/2018 1304   ACIDBASEDEF 1.0 12/21/2018 1214   O2SAT 90.0 12/21/2018 1304   CBG (last 3)   Recent Labs    12/22/18 0700 12/22/18 1051 12/22/18 1224  GLUCAP 107* 120* 123*    Assessment/Plan: S/P Procedure(s) (LRB): REPAIR OF TYPE A AORTIC DISSECTION USING HEMASHIELD PLATINUM 28MM AND 32MM GRAFT (N/A) STERNAL PLATING Mobilize Diuresis PT/OT   LOS: 1 day    Wonda Olds 12/22/2018

## 2018-12-22 NOTE — Anesthesia Postprocedure Evaluation (Signed)
Anesthesia Post Note  Patient: Kaitlyn Rodriguez  Procedure(s) Performed: REPAIR OF TYPE A AORTIC DISSECTION USING HEMASHIELD PLATINUM 28MM AND 32MM GRAFT (N/A ) STERNAL PLATING     Patient location during evaluation: SICU Anesthesia Type: General Level of consciousness: sedated Pain management: pain level controlled Vital Signs Assessment: post-procedure vital signs reviewed and stable Respiratory status: patient remains intubated per anesthesia plan Cardiovascular status: stable Postop Assessment: no apparent nausea or vomiting Anesthetic complications: no    Last Vitals:  Vitals:   12/22/18 0600 12/22/18 0700  BP: (!) 122/97 122/80  Pulse: 94 92  Resp: 15 14  Temp:  36.8 C  SpO2: 95% 97%    Last Pain:  Vitals:   12/22/18 0700  TempSrc: Oral  PainSc:                  Kaitlyn Rodriguez

## 2018-12-22 NOTE — Progress Notes (Addendum)
Progress Note  Patient Name: Kaitlyn Rodriguez Date of Encounter: 12/22/2018  Primary Cardiologist: No primary care provider on file.   Subjective   Ms. Kaitlyn Rodriguez was examined bedside this morning she reports of feeling somewhat uncomfortable at the surgical site but otherwise denies shortness of breath.  She was able to tolerate her clear liquid diet this morning without any difficulties.  She is able to transfer to the bedside recliner with help of nursing staff.  Nursing staff had reported that she had been delirious overnight  Inpatient Medications    Scheduled Meds: . acetaminophen  1,000 mg Oral Q6H   Or  . acetaminophen (TYLENOL) oral liquid 160 mg/5 mL  1,000 mg Per Tube Q6H  . aspirin  81 mg Oral Daily  . bisacodyl  10 mg Oral Daily   Or  . bisacodyl  10 mg Rectal Daily  . Chlorhexidine Gluconate Cloth  6 each Topical Daily  . docusate sodium  200 mg Oral Daily  . insulin regular  0-10 Units Intravenous TID WC  . levothyroxine  150 mcg Oral QAC breakfast  . metoprolol tartrate  12.5 mg Oral BID   Or  . metoprolol tartrate  12.5 mg Per Tube BID  . [START ON 12/23/2018] pantoprazole  40 mg Oral Daily  . sodium chloride flush  3 mL Intravenous Q12H   Continuous Infusions: . sodium chloride Stopped (12/21/18 1726)  . sodium chloride    . sodium chloride 20 mL/hr at 12/21/18 0752  . cefUROXime (ZINACEF)  IV Stopped (12/21/18 2353)  . insulin 1.1 mL/hr at 12/22/18 0600  . lactated ringers    . lactated ringers    . lactated ringers Stopped (12/21/18 1746)   PRN Meds: sodium chloride, lactated ringers, metoprolol tartrate, ondansetron (ZOFRAN) IV, oxyCODONE, sodium chloride flush, traMADol   Vital Signs    Vitals:   12/22/18 0200 12/22/18 0300 12/22/18 0400 12/22/18 0500  BP: (!) 143/75 (!) 143/82 (!) 151/76 (!) 143/76  Pulse: 89 85 93 94  Resp: (!) 23 (!) 23 11 13   Temp:   98.1 F (36.7 C)   TempSrc:   Oral   SpO2: 91% 95% 95% 94%  Weight:    102.3 kg  Height:         Intake/Output Summary (Last 24 hours) at 12/22/2018 0637 Last data filed at 12/22/2018 0600 Gross per 24 hour  Intake 20287.48 ml  Output 2369 ml  Net 17918.48 ml   Filed Weights   12/20/18 1714 12/22/18 0500  Weight: 86.2 kg 102.3 kg    Telemetry    No evidence of V. tach or V. Fib.  Was noted to have an episode of tachycardia- Personally Reviewed  ECG    Pending- Personally Reviewed  Physical Exam   GEN: No acute distress.   Cardiac: RRR, no murmurs, rubs, or gallops.  Respiratory: Clear to auscultation bilaterally. MS: No edema; No deformity. Neuro:  Nonfocal  Psych: Normal affect   Labs    Chemistry Recent Labs  Lab 12/20/18 1807  12/21/18 0637 12/21/18 0658 12/21/18 1158 12/21/18 1214 12/21/18 1304  NA 133*   < > 141 139  140 137 139 139  K 4.2   < > 3.9 4.2  4.2 4.4 4.3 4.1  CL 99   < > 110 102 105  --   --   CO2 17*  --  25  --  23  --   --   GLUCOSE 288*   < > 101*  102* 133*  --   --   BUN 13   < > 15 18 22   --   --   CREATININE 1.04*   < > 0.86 0.90 1.07*  --   --   CALCIUM 9.2  --  6.2*  --  7.7*  --   --   GFRNONAA 50*  --  >60  --  48*  --   --   GFRAA 58*  --  >60  --  56*  --   --   ANIONGAP 17*  --  6  --  9  --   --    < > = values in this interval not displayed.     Hematology Recent Labs  Lab 12/20/18 1807  12/21/18 0307  12/21/18 0637  12/21/18 1158 12/21/18 1214 12/21/18 1304  WBC 13.2*  --   --   --  20.4*  --  12.6*  --   --   RBC 4.10  --   --   --  4.21  --  3.75*  --   --   HGB 8.8*   < > 6.8*   < > 10.1*   < > 8.9* 8.8* 8.5*  HCT 30.8*   < > 22.0*   < > 32.4*   < > 28.6* 26.0* 25.0*  MCV 75.1*  --   --   --  77.0*  --  76.3*  --   --   MCH 21.5*  --   --   --  24.0*  --  23.7*  --   --   MCHC 28.6*  --   --   --  31.2  --  31.1  --   --   RDW 19.8*  --   --   --  20.1*  --  19.6*  --   --   PLT 479*  --  56*  --  142*  --  125*  --   --    < > = values in this interval not displayed.    Cardiac EnzymesNo  results for input(s): TROPONINI in the last 168 hours. No results for input(s): TROPIPOC in the last 168 hours.   BNP Recent Labs  Lab 12/20/18 1807  BNP 233.8*     DDimer  Recent Labs  Lab 12/20/18 1807  DDIMER 3.42*     Radiology    Dg Chest Port 1 View  Result Date: 12/21/2018 CLINICAL DATA:  Postop aortic aneurysm repair EXAM: PORTABLE CHEST 1 VIEW COMPARISON:  12/20/2018 FINDINGS: Endotracheal tube in good position. Swan-Ganz catheter in the main pulmonary artery. Bilateral chest tubes in place. No pneumothorax Left lower lobe atelectasis. Right perihilar atelectasis. Possible small left effusion. NG tube in the stomach. IMPRESSION: Support lines in good position. No pneumothorax postop median sternotomy. Bibasilar atelectasis left greater than right. Electronically Signed   By: Franchot Gallo M.D.   On: 12/21/2018 07:54   Dg Chest Portable 1 View  Result Date: 12/20/2018 CLINICAL DATA:  Chest pain EXAM: PORTABLE CHEST 1 VIEW COMPARISON:  None. FINDINGS: There is mild cardiomegaly. Mildly increased interstitial markings seen within the left mid lung and left lower lung. Streaky atelectasis seen at the right lung base. No pleural effusion. No acute osseous abnormality. IMPRESSION: Nonspecific increased interstitial markings at the left lower lung. This could be due to atelectasis, chronic lung changes, and/or early infectious etiology. Electronically Signed   By: Prudencio Pair M.D.   On: 12/20/2018 19:17  Ct Angio Chest/abd/pel For Dissection W And/or Wo Contrast  Result Date: 12/20/2018 CLINICAL DATA:  85 year old female with ongoing chest pain radiating to her shoulder blade. New onset of shortness of breath. EXAM: CT ANGIOGRAPHY CHEST, ABDOMEN AND PELVIS TECHNIQUE: Multidetector CT imaging through the chest, abdomen and pelvis was performed using the standard protocol during bolus administration of intravenous contrast. Multiplanar reconstructed images and MIPs were obtained and  reviewed to evaluate the vascular anatomy. CONTRAST:  OMNIPAQUE IOHEXOL 350 MG/ML SOLN COMPARISON:  Chest radiograph dated 12/20/2018. FINDINGS: CTA CHEST FINDINGS Cardiovascular: There is no cardiomegaly. There is a large pericardial effusion measuring up to 3.7 cm in thickness anterior to the heart. There is associated slight mass effect and flattening of the anterior wall of the right ventricle. Higher attenuation of the pericardial fluid most consistent with hemopericardium. They are is a crescentic high attenuating density starting from the aortic root and extending to the proximal aortic arch most consistent with intramural hematoma. This appears to extend into the brachiocephalic trunk. The visualized origins of the great vessels of the aortic arch otherwise remain patent. There is a penetrating ulcer or a pseudoaneurysm along the medial wall of the ascending aorta measuring approximately 1 cm in depth and 3 cm in length (coronal series 11, image 67 and axial series 8, image 50). There is a focal area of heterogeneous density and opacification involving the proximal descending thoracic aorta (series 8, image 80 and coronal series 11 images 107-115). This may represent an area of intimal injury, or developing clot. No definite extraluminal contrast identified. Mediastinum/Nodes: There is no hilar or mediastinal adenopathy. The esophagus and the thyroid gland are grossly unremarkable. Lungs/Pleura: Trace bilateral pleural effusions. There are bibasilar subsegmental atelectasis as well as diffuse linear and hazy densities throughout the lungs most consistent with atelectatic changes or areas of air trapping. No consolidative changes. No pneumothorax. The central airways are patent. Musculoskeletal: There is degenerative changes of the spine. Multilevel disc desiccation and vacuum phenomena. No acute osseous pathology. Review of the MIP images confirms the above findings. CTA ABDOMEN AND PELVIS FINDINGS  VASCULAR Aorta: Mild atherosclerotic calcification. No aneurysmal dilatation or dissection. Celiac: Patent without evidence of aneurysm, dissection, vasculitis or significant stenosis. SMA: Patent without evidence of aneurysm, dissection, vasculitis or significant stenosis. Renals: Both renal arteries are patent without evidence of aneurysm, dissection, vasculitis, fibromuscular dysplasia or significant stenosis. IMA: Patent without evidence of aneurysm, dissection, vasculitis or significant stenosis. Inflow: Patent without evidence of aneurysm, dissection, vasculitis or significant stenosis. Veins: No obvious venous abnormality within the limitations of this arterial phase study. Review of the MIP images confirms the above findings. NON-VASCULAR No intra-abdominal free air or free fluid. Hepatobiliary: The liver and gallbladder appear unremarkable. Pancreas: Unremarkable. No pancreatic ductal dilatation or surrounding inflammatory changes. Spleen: Normal in size without focal abnormality. Adrenals/Urinary Tract: The adrenal glands are unremarkable. Minimal fullness of the left renal collecting systems versus parapelvic cyst. The right kidney is unremarkable. The visualized ureters appear unremarkable. The urinary bladder is collapsed. Stomach/Bowel: There is no bowel obstruction or active inflammation. The appendix is not visualized with certainty. No inflammatory changes identified in the right lower quadrant. Lymphatic: No adenopathy. Reproductive: Hysterectomy. No pelvic mass. Other: None Musculoskeletal: Osteopenia with multilevel degenerative changes of the spine. Multilevel disc desiccation and vacuum phenomena. No acute osseous pathology. Review of the MIP images confirms the above findings. IMPRESSION: 1. Findings most consistent with active intramural hemorrhage involving the aortic root, ascending aorta and proximal aortic  arch with a large hemopericardium. Clinical correlation is recommended to evaluate  for cardiac tamponade. 2. Focal penetrating ulcer or pseudoaneurysm of the medial wall of the distal ascending aorta. 3. Focal heterogeneity of intraluminal contrast in the proximal descending thoracic aorta may represent underlying intimal injury. 4. No acute intra-abdominal or pelvic pathology. These results were called by telephone at the time of interpretation on 12/20/2018 at 8:00 pm to provider Excell Seltzer, who verbally acknowledged these results. Electronically Signed   By: Elgie Collard M.D.   On: 12/20/2018 20:36    Cardiac Studies   None available  Patient Profile     84 y.o. 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  She is off phenylephrine and is currently hemodynamically stable. -Encourage ambulation -Continue metoprolol, aspirin -Follow-up echocardiogram -Follow-up EKG  #Thyroid disease TSH unremarkable -Continue Synthroid  #Prediabetes Hemoglobin A1c of 6.3%.  Will advise on lifestyle modification  For questions or updates, please contact [facility] Please consult [email or web address] for contact info under Cardiology/STEMI.      Signed, Yvette Rack, MD  12/22/2018, 6:37 AM

## 2018-12-23 ENCOUNTER — Inpatient Hospital Stay (HOSPITAL_COMMUNITY): Payer: Medicare Other

## 2018-12-23 ENCOUNTER — Encounter (HOSPITAL_COMMUNITY): Payer: Self-pay | Admitting: Cardiothoracic Surgery

## 2018-12-23 DIAGNOSIS — I420 Dilated cardiomyopathy: Secondary | ICD-10-CM

## 2018-12-23 LAB — CBC WITH DIFFERENTIAL/PLATELET
Abs Immature Granulocytes: 0.07 10*3/uL (ref 0.00–0.07)
Basophils Absolute: 0 10*3/uL (ref 0.0–0.1)
Basophils Relative: 0 %
Eosinophils Absolute: 0 10*3/uL (ref 0.0–0.5)
Eosinophils Relative: 0 %
HCT: 27.7 % — ABNORMAL LOW (ref 36.0–46.0)
Hemoglobin: 8.4 g/dL — ABNORMAL LOW (ref 12.0–15.0)
Immature Granulocytes: 1 %
Lymphocytes Relative: 11 %
Lymphs Abs: 1.6 10*3/uL (ref 0.7–4.0)
MCH: 23.3 pg — ABNORMAL LOW (ref 26.0–34.0)
MCHC: 30.3 g/dL (ref 30.0–36.0)
MCV: 76.9 fL — ABNORMAL LOW (ref 80.0–100.0)
Monocytes Absolute: 1.3 10*3/uL — ABNORMAL HIGH (ref 0.1–1.0)
Monocytes Relative: 9 %
Neutro Abs: 10.9 10*3/uL — ABNORMAL HIGH (ref 1.7–7.7)
Neutrophils Relative %: 79 %
Platelets: 175 10*3/uL (ref 150–400)
RBC: 3.6 MIL/uL — ABNORMAL LOW (ref 3.87–5.11)
RDW: 20.9 % — ABNORMAL HIGH (ref 11.5–15.5)
WBC: 13.9 10*3/uL — ABNORMAL HIGH (ref 4.0–10.5)
nRBC: 0 % (ref 0.0–0.2)

## 2018-12-23 LAB — BASIC METABOLIC PANEL
Anion gap: 7 (ref 5–15)
BUN: 16 mg/dL (ref 8–23)
CO2: 26 mmol/L (ref 22–32)
Calcium: 7.7 mg/dL — ABNORMAL LOW (ref 8.9–10.3)
Chloride: 99 mmol/L (ref 98–111)
Creatinine, Ser: 0.75 mg/dL (ref 0.44–1.00)
GFR calc Af Amer: >60 mL/min (ref >60)
GFR calc non Af Amer: >60 mL/min (ref >60)
Glucose, Bld: 106 mg/dL — ABNORMAL HIGH (ref 70–99)
Potassium: 4.3 mmol/L (ref 3.5–5.1)
Sodium: 132 mmol/L — ABNORMAL LOW (ref 135–145)

## 2018-12-23 LAB — GLUCOSE, CAPILLARY
Glucose-Capillary: 96 mg/dL (ref 70–99)
Glucose-Capillary: 147 mg/dL — ABNORMAL HIGH (ref 70–99)
Glucose-Capillary: 99 mg/dL (ref 70–99)
Glucose-Capillary: 72 mg/dL (ref 70–99)
Glucose-Capillary: 105 mg/dL — ABNORMAL HIGH (ref 70–99)
Glucose-Capillary: 98 mg/dL (ref 70–99)

## 2018-12-23 LAB — SURGICAL PATHOLOGY

## 2018-12-23 MED ORDER — METOPROLOL TARTRATE 25 MG/10 ML ORAL SUSPENSION
12.5000 mg | Freq: Two times a day (BID) | ORAL | Status: DC
  Filled 2018-12-23 (×2): qty 5

## 2018-12-23 MED ORDER — FUROSEMIDE 10 MG/ML IJ SOLN
20.0000 mg | Freq: Two times a day (BID) | INTRAMUSCULAR | Status: DC
  Administered 2018-12-23 (×2): 20 mg via INTRAVENOUS
  Filled 2018-12-23 (×2): qty 2

## 2018-12-23 MED ORDER — OLANZAPINE 5 MG PO TBDP
2.5000 mg | ORAL_TABLET | Freq: Every day | ORAL | Status: DC
  Administered 2018-12-23 – 2018-12-24 (×2): 2.5 mg via ORAL
  Filled 2018-12-23 (×4): qty 0.5

## 2018-12-23 MED ORDER — METOPROLOL TARTRATE 25 MG PO TABS
25.0000 mg | ORAL_TABLET | Freq: Two times a day (BID) | ORAL | Status: DC
  Administered 2018-12-23 (×2): 25 mg via ORAL
  Filled 2018-12-23 (×2): qty 1

## 2018-12-23 MED FILL — Lidocaine HCl Local Preservative Free (PF) Inj 2%: INTRAMUSCULAR | Qty: 15 | Status: AC

## 2018-12-23 MED FILL — Heparin Sodium (Porcine) Inj 1000 Unit/ML: INTRAMUSCULAR | Qty: 30 | Status: AC

## 2018-12-23 MED FILL — Potassium Chloride Inj 2 mEq/ML: INTRAVENOUS | Qty: 40 | Status: AC

## 2018-12-23 NOTE — Evaluation (Signed)
Occupational Therapy Evaluation Patient Details Name: Kaitlyn Rodriguez MRN: [id number] DOB: 20-Aug-1933 Today's Date: 12/23/2018    History of Present Illness Patient is an 85 y/o female admitted with 1 week of subtle symptoms of malaise and fatigue, shoulder pain which worsened on 12/20/2018.   Work-up suggested myocardial infarction and assessment showed large pericardial effusion.  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   Clinical Impression   PTA patient independent. Admitted for above and limited by problem list below, including impaired balance, decreased activity tolerance, generalized weakness, and impaired cognition.  Patient currently requires min assist +2 for transfers, min assist for UB ADLs, mod-max assist for LB ADLs, and min assist for grooming.  She is disoriented to situation, has difficulty following sternal precautions without min to mod cueing, and presents with decreased STM.  Limited session due to N&V today. Son present at completion of session, confirms home setup and available to assist 24/7. Patient will benefit from continued OT services while admitted and after dc at [facility] level (with aide support) in order to maximize independence, safety with ADls/mobility.     Follow Up Recommendations  Home health OT;Supervision/Assistance - 24 hour;Other (comment)(HH aide)    Equipment Recommendations  3 in 1 bedside commode    Recommendations for Other Services       Precautions / Restrictions Precautions Precautions: Fall;Sternal Precaution Comments: verbally reviewed precautions and requires min-mod cueing to adhere functionally Restrictions Weight Bearing Restrictions: Yes Other Position/Activity Restrictions: sternal precautions       Mobility Bed Mobility               General bed mobility comments: up in chair   Transfers Overall transfer level: Needs  assistance Equipment used: Rolling walker (2 wheeled) Transfers: Sit to/from [employer] Transfers Sit to Stand: Min assist;+2 physical assistance Stand pivot transfers: Min assist       General transfer comment: cues for sternal precautions with sit to stand and some lifting help from recliner, pivot with RW to [facility]; then stood to take few steps wtih chair following due to nausea    Balance Overall balance assessment: Needs assistance Sitting-balance support: No upper extremity supported;Feet supported Sitting balance-Leahy Scale: Good     Standing balance support: Bilateral upper extremity supported;Single extremity supported;During functional activity Standing balance-Leahy Scale: Fair Standing balance comment: relaint on BUE dynamically and at least 1 UE statically                           ADL either performed or assessed with clinical judgement   ADL Overall ADL's : Needs assistance/impaired     Grooming: Minimal assistance;Sitting   Upper Body Bathing: Minimal assistance;Sitting   Lower Body Bathing: Moderate assistance;+2 for physical assistance;Sit to/from stand Lower Body Bathing Details (indicate cue type and reason): decreased reach to B LEs and buttocks, min assist +2 sit<>stand Upper Body Dressing : Minimal assistance;Sitting   Lower Body Dressing: Maximal assistance;+2 for physical assistance;Sit to/from stand Lower Body Dressing Details (indicate cue type and reason): assist with socks, min assist +2 sit to stand  Toilet Transfer: Minimal assistance;Stand-pivot;RW;BSC;+2 for physical assistance   Toileting- Clothing Manipulation and Hygiene: Moderate assistance;Sit to/from stand Toileting - Clothing Manipulation Details (indicate cue type and reason): clothing mgmt and safety     Functional mobility during ADLs: Minimal assistance;+2 for physical assistance;Rolling walker;Cueing for sequencing;Cueing for safety General ADL Comments:  pt  limited by cognition, impaired balance, decreased activity tolerance, and generalized weakness     Vision         Perception     Praxis      Pertinent Vitals/Pain Pain Assessment: Faces Faces Pain Scale: Hurts little more Pain Location: chest-incisional; shoulders  Pain Descriptors / Indicators: Discomfort;Grimacing;Guarding Pain Intervention(s): Monitored during session;Repositioned     Hand Dominance     Extremity/Trunk Assessment Upper Extremity Assessment Upper Extremity Assessment: Generalized weakness(within sternal precautions )   Lower Extremity Assessment Lower Extremity Assessment: Defer to PT evaluation   Cervical / Trunk Assessment Cervical / Trunk Assessment: Kyphotic   Communication Communication Communication: No difficulties   Cognition Arousal/Alertness: Awake/alert Behavior During Therapy: WFL for tasks assessed/performed Overall Cognitive Status: Impaired/Different from baseline Area of Impairment: Orientation;Safety/judgement;Attention;Memory;Awareness;Problem solving;Following commands                 Orientation Level: Disoriented to;Situation Current Attention Level: Sustained Memory: Decreased recall of precautions;Decreased short-term memory Following Commands: Follows one step commands consistently;Follows one step commands with increased time;Follows multi-step commands inconsistently Safety/Judgement: Decreased awareness of safety;Decreased awareness of deficits Awareness: Emergent Problem Solving: Decreased initiation;Slow processing;Difficulty sequencing;Requires verbal cues General Comments: pt with poor adherance to precautions and situation, slow processing and difficulty following mulitple step commands    General Comments  pt with N&V after mobility, SpO2 drop (poor waveform) stabilized but applied 2L O2; RN aware    Exercises     Shoulder Instructions      Home Living Family/patient expects to be discharged to::  Private residence Living Arrangements: Children(son) Available Help at Discharge: Family;Available 24 hours/day Type of Home: House Home Access: Stairs to enter [employer] of Steps: 3 Entrance Stairs-Rails: None Home Layout: One level     Bathroom Shower/Tub: Chief Strategy Officer: Standard     Home Equipment: None          Prior Functioning/Environment Level of Independence: Needs assistance  Gait / Transfers Assistance Needed: independent  ADL's / Homemaking Assistance Needed: sponge bathing only, independent ADLs, limited IADls, med mgmt    Comments: recent fall over throw rug        OT Problem List: Decreased strength;Decreased activity tolerance;Impaired balance (sitting and/or standing);Decreased coordination;Decreased cognition;Decreased safety awareness;Decreased knowledge of use of DME or AE;Decreased knowledge of precautions;Cardiopulmonary status limiting activity;Pain;Obesity      OT Treatment/Interventions: Self-care/ADL training;Energy conservation;DME and/or AE instruction;Therapeutic exercise;Therapeutic activities;Patient/family education;Balance training    OT Goals(Current goals can be found in the care plan section) Acute Rehab OT Goals Patient Stated Goal: to return home OT Goal Formulation: With patient Time For Goal Achievement: 01/06/19 Potential to Achieve Goals: Good  OT Frequency: Min 2X/week   Barriers to D/C:            Co-evaluation PT/OT/SLP Co-Evaluation/Treatment: Yes Reason for Co-Treatment: For patient/therapist safety;To address functional/ADL transfers   OT goals addressed during session: ADL's and self-care      AM-PAC OT "6 Clicks" Daily Activity     Outcome Measure Help from another person eating meals?: A Little Help from another person taking care of personal grooming?: A Little Help from another person toileting, which includes using toliet, bedpan, or urinal?: A Lot Help from another person  bathing (including washing, rinsing, drying)?: A Lot Help from another person to put on and taking off regular upper body clothing?: A Little Help from another person to put on and taking off regular lower body clothing?: A Lot 6 Click  Score: 15   End of Session Equipment Utilized During Treatment: Rolling walker;Gait belt;Oxygen Nurse Communication: Mobility status  Activity Tolerance: Patient limited by fatigue;Treatment limited secondary to medical complications (Comment)(N&V) Patient left: in chair;with call bell/phone within reach;with nursing/sitter in room;with family/visitor present  OT Visit Diagnosis: Other abnormalities of gait and mobility (R26.89);Muscle weakness (generalized) (M62.81);Pain;Other symptoms and signs involving cognitive function Pain - part of body: Shoulder(bil)                Time: [id number] OT Time Calculation (min): 39 min Charges:  OT General Charges $OT Visit: 1 Visit OT Evaluation $OT Eval Moderate Complexity: 1 Mod  Chancy Milroyhristie S Bekka Qian, OT Acute Rehabilitation Services Pager [phone] Office [phone]   Chancy MilroyChristie S Kyeshia Zinn 12/23/2018, 1:32 PM

## 2018-12-23 NOTE — Progress Notes (Signed)
Patient ID: Kaitlyn Rodriguez, female   DOB: Jul 16, 1933, 85 y.o.   MRN: [id number] TCTS Evening Rounds:  Hemodynamically stable in sinus rhythm.  Awaiting bed on 4E.

## 2018-12-23 NOTE — Progress Notes (Signed)
3 Days Post-Op Procedure(s) (LRB): REPAIR OF TYPE A AORTIC DISSECTION USING HEMASHIELD PLATINUM 28MM AND 32MM GRAFT (N/A) STERNAL PLATING Subjective: Tired and weary  Objective: Vital signs in last 24 hours: Temp:  [97.8 F (36.6 C)-98.6 F (37 C)] 98 F (36.7 C) (10/27 0356) Pulse Rate:  [79-105] 100 (10/27 0700) Cardiac Rhythm: Normal sinus rhythm (10/27 0400) Resp:  [0-28] 18 (10/27 0700) BP: (98-176)/(64-103) 107/73 (10/27 0600) SpO2:  [84 %-100 %] 96 % (10/27 0700) Arterial Line BP: (106-156)/(55-137) 106/61 (10/26 1800) Weight:  [103.3 kg] 103.3 kg (10/27 0630)  Hemodynamic parameters for last 24 hours:    Intake/Output from previous day: 10/26 0701 - 10/27 0700 In: 1230.7 [P.O.:1020; I.V.:10.9; IV Piggyback:199.9] Out: 2311 [Urine:1560; Stool:1; Chest Tube:750] Intake/Output this shift: No intake/output data recorded.  General appearance: alert, cooperative and no distress Neurologic: intact Heart: regular rate and rhythm, S1, S2 normal, no murmur, click, rub or gallop Lungs: clear to auscultation bilaterally Extremities: edema 1+ Wound: dressed, dry  Lab Results: Recent Labs    12/22/18 1900 12/23/18 0312  WBC 13.9* 13.9*  HGB 8.4* 8.4*  HCT 27.2* 27.7*  PLT 163 175   BMET:  Recent Labs    12/22/18 1900 12/23/18 0312  NA 134* 132*  K 3.6 4.3  CL 100 99  CO2 25 26  GLUCOSE 129* 106*  BUN 20 16  CREATININE 0.71 0.75  CALCIUM 7.8* 7.7*    PT/INR:  Recent Labs    12/21/18 0637  LABPROT 23.3*  INR 2.1*   ABG    Component Value Date/Time   PHART 7.349 (L) 12/21/2018 1304   HCO3 25.8 12/21/2018 1304   TCO2 27 12/21/2018 1304   ACIDBASEDEF 1.0 12/21/2018 1214   O2SAT 90.0 12/21/2018 1304   CBG (last 3)  Recent Labs    12/22/18 1938 12/22/18 2324 12/23/18 0355  GLUCAP 99 97 96    Assessment/Plan: S/P Procedure(s) (LRB): REPAIR OF TYPE A AORTIC DISSECTION USING HEMASHIELD PLATINUM 28MM AND 32MM GRAFT (N/A) STERNAL  PLATING Mobilize Diabetes control Plan for transfer to step-down: see transfer orders  May be able to go home in a day or two--would appreciate PT/OT recs for home health   LOS: 2 days    Wonda Olds 12/23/2018

## 2018-12-23 NOTE — Evaluation (Signed)
Physical Therapy Evaluation Patient Details Name: Kaitlyn Rodriguez MRN: [id number] DOB: Jul 24, 1933 Today's Date: 12/23/2018   History of Present Illness  Patient is an 85 y/o female admitted with 1 week of subtle symptoms of malaise and fatigue, shoulder pain which worsened on 12/20/2018.   Work-up suggested myocardial infarction and assessment showed large pericardial effusion.  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  Clinical Impression  Patient presents with decreased independence with mobility due to deficits listed in PT problem list.  Currently min to mod A for transfers and short distance ambulation limited this session with N&V.  Patient will have son to assist at d/c.  Feel she will progress and be able to transition home with son support and follow up HHPT/aide.  PT to follow acutely.     Follow Up Recommendations Home health PT;Supervision/Assistance - 24 hour(HH aide)    Equipment Recommendations  Rolling walker with 5" wheels;3in1 (PT)    Recommendations for Other Services       Precautions / Restrictions Precautions Precautions: Fall;Sternal      Mobility  Bed Mobility               General bed mobility comments: up in chair and though nursing wanted pt back to bed to pull chest tube she wanted to sit up a bit more  Transfers Overall transfer level: Needs assistance Equipment used: Rolling walker (2 wheeled) Transfers: Sit to/from [employer] Sit to Stand: Min assist;+2 physical assistance Stand pivot transfers: Min assist       General transfer comment: cues for sternal precautions with sit to stand and some lifting help from recliner, pivot with RW to [facility]; then stood to take few steps wtih chair following due to nausea  Ambulation/Gait Ambulation/Gait assistance: Min assist;+2 safety/equipment Gait Distance (Feet): 4 Feet Assistive device:  Rolling walker (2 wheeled) Gait Pattern/deviations: Step-to pattern;Trunk flexed;Decreased stride length     General Gait Details: encouragement needed to take few steps after toileting due to c/o nausea  Stairs            Wheelchair Mobility    Modified Rankin (Stroke Patients Only)       Balance Overall balance assessment: Needs assistance   Sitting balance-Leahy Scale: Good     Standing balance support: During functional activity Standing balance-Leahy Scale: Fair Standing balance comment: completed perineal hygiene after toileting                             Pertinent Vitals/Pain Pain Assessment: No/denies pain    Home Living Family/patient expects to be discharged to:: Private residence Living Arrangements: Children(son) Available Help at Discharge: Family;Available 24 hours/day Type of Home: House Home Access: Stairs to enter Entrance Stairs-Rails: None Entrance Stairs-Number of Steps: 3 Home Layout: One level Home Equipment: None      Prior Function Level of Independence: Needs assistance      ADL's / Homemaking Assistance Needed: sponge bathing only, independent ADLs, limited IADls, med mgmt   Comments: recent fall over throw rug     Hand Dominance        Extremity/Trunk Assessment   Upper Extremity Assessment Upper Extremity Assessment: Defer to OT evaluation    Lower Extremity Assessment Lower Extremity Assessment: Generalized weakness    Cervical / Trunk Assessment Cervical / Trunk Assessment: Kyphotic  Communication   Communication: No difficulties  Cognition Arousal/Alertness:  Awake/alert Behavior During Therapy: WFL for tasks assessed/performed Overall Cognitive Status: Impaired/Different from baseline Area of Impairment: Orientation;Safety/judgement                 Orientation Level: Disoriented to;Situation       Safety/Judgement: Decreased awareness of safety;Decreased awareness of deficits             General Comments General comments (skin integrity, edema, etc.): SpO2 dropped after activity and pt with N&V, but poor wave form, stabilized and up to 90% only so O2 applied and RN aware; son arrived also at end of session    Exercises     Assessment/Plan    PT Assessment Patient needs continued PT services  PT Problem List Decreased strength;Decreased activity tolerance;Decreased mobility;Decreased safety awareness;Cardiopulmonary status limiting activity;Decreased knowledge of precautions;Decreased knowledge of use of DME       PT Treatment Interventions DME instruction;Stair training;Therapeutic activities;Balance training;Gait training;Functional mobility training;Therapeutic exercise;Patient/family education    PT Goals (Current goals can be found in the Care Plan section)  Acute Rehab PT Goals Patient Stated Goal: to return home PT Goal Formulation: With patient/family Time For Goal Achievement: 01/06/19 Potential to Achieve Goals: Good    Frequency Min 3X/week   Barriers to discharge        Co-evaluation PT/OT/SLP Co-Evaluation/Treatment: Yes Reason for Co-Treatment: For patient/therapist safety;To address functional/ADL transfers           AM-PAC PT "6 Clicks" Mobility  Outcome Measure Help needed turning from your back to your side while in a flat bed without using bedrails?: A Little Help needed moving from lying on your back to sitting on the side of a flat bed without using bedrails?: A Lot Help needed moving to and from a bed to a chair (including a wheelchair)?: A Little Help needed standing up from a chair using your arms (e.g., wheelchair or bedside chair)?: A Little Help needed to walk in hospital room?: A Little Help needed climbing 3-5 steps with a railing? : A Lot 6 Click Score: 16    End of Session Equipment Utilized During Treatment: Gait belt;Oxygen Activity Tolerance: Patient limited by fatigue;Other (comment)(nausea) Patient left: in  chair;with call bell/phone within reach;with family/visitor present Nurse Communication: Mobility status;Other (comment)(needed nausea medicaion) PT Visit Diagnosis: Muscle weakness (generalized) (M62.81);Difficulty in walking, not elsewhere classified (R26.2)    Time: 0165-5374 PT Time Calculation (min) (ACUTE ONLY): 43 min   Charges:   PT Evaluation $PT Eval Moderate Complexity: 1 Mod PT Treatments $Therapeutic Activity: 8-22 mins        Kaitlyn Rodriguez, [place] [facility] [phone] 12/23/2018   Kaitlyn Rodriguez 12/23/2018, 1:10 PM

## 2018-12-23 NOTE — Discharge Summary (Signed)
Physician Discharge Summary       [address]       [place] [id number]             [phone]    Patient ID: Kaitlyn GrandchildCarol F Rodriguez MRN: [id number] DOB/AGE: Feb 10, 1934 85 y.o.  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 Repair of aortic dissection 2. ABL anemia 3. Hypercapnia and hypoxia 4. A fib with RVR 5. History of hypothyroidism  Consults: Pulmonary/intensive care, Palliative care, nephrology  Procedure (s):  Replacement of ascending aorta with aortic valve resuspension proximally (28 mm Dacron graft at level of STJ) and distal hemiarch reconstruction (32 mm Dacron graft) under hypthermic circulatory arrest (54 minutes) with antegrade cerebral perfusion (53 minutes); right axillary artery cannulation; right common femoral vein cannulation; Rigid sternal reconstruction by Dr. Vickey SagesAtkins on 12/21/2018.  History of Presenting Illness This is an 85 year old female who was in good health until this past week when she had fatigue and intermittent dyspnea. She thought she might have a cold or allergies. Did well until this pm when she had dramatically worse sx, prompting ER visit, where she was hypotensive and tachycardiac. 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 Consult received for repair. Now comfortable and hemodynamically stable.   In summary, this is an 85 yo lady with pending aortic catastrophe. She is accompanied by her son, and they are counseled as to diagnosis and need for urgent surgery in order to ensure best outcome. They understand and wish to proceed immediately. She underwent repair of aortic dissection, re suspension of aortic valve, and  Rigid sternal reconstruction on 12/21/2018.  Brief Hospital Course:  The  patient was extubated the afternoon of surgery without difficulty. She was initially hypotensive and paced.Theone MurdochSwan Ganz, a line, chest tubes, and foley were removed early in the post operative course.  She was volume over loaded and diuresed. She had an echo done on 10/26 that showed the LVEF 35-40% with no evidence of cardiac tamponade. She was weaned off the insulin drip.  he patient was felt surgically stable for transfer from the ICU to [facility] for further convalescence on 10/27 . She developed a fib with RVR and hypotension early 10/28. Initially, she was given Albumin and put on an Amiodarone drip. H and H was found to be decreased to 7.6 and 25.6. She was given PRBCs. Follow up H and H was 9.2 and 31.6. PT/OT consults were obtained to assist with ambulation.   She has been tolerating a diet and has had a bowel movement. Epicardial pacing wires were removed on 10/29. She became more lethargic. ABG revealed hypercapnia. She was put on bi pap and pulmonary/CCM was consulted to assist with management. She was transferred back to the ICU on 10/30 for closer monitoring of her respiratory status. She was given Albumin for hypotension. A discussion was had with son and patient and palliative care consult was obtained on 11/01. Patient does not want to be intubated. A nephrology consult was obtained secondary to diffuse edema. Over the next few days, her respiratory status improved. She was diuresed with Lasix drip.  She remained in a fib and medications were adjusted accordingly for rate control. She was weaned off bi pap and to  on 11/04. She was felt stable for transfer from the ICU to 4E on  11/07. She was very deconditioned and OT/PT assisted with her mobility. Right axillary and sternal staples were removed on 11/10. PT/OT recommended SNF;however, patient has a very attentive son and it was felt she would benefit from discharge to home with home health and PT.  Chest tube sutures and staples will be removed the day of  discharge. She continues to require 2 liters of oxygen via [place] with ambulation and we will arrange home oxygen. The patient is felt surgically stable for discharge today.   Latest Vital Signs: Blood pressure 113/70, pulse 75, temperature 97.7 F (36.5 C), temperature source Oral, resp. rate 16, height  (1.676 m), weight 95.1 kg, SpO2 91 %.  Physical Exam: Cardiovascular: IRRR IRRR Pulmonary: Clear to auscultation bilaterally Abdomen: Soft, non tender, bowel sounds present. Extremities: Mild bilateral lower extremity edema, but is decreasing Wounds: Clean and dry.  No erythema or signs of infection.   Discharge Condition: Stable and discharged to home.  Recent laboratory studies:  Lab Results  Component Value Date   WBC 9.5 01/04/2019   HGB 9.2 (L) 01/04/2019   HCT 31.1 (L) 01/04/2019   MCV 81.8 01/04/2019   PLT 611 (H) 01/04/2019   Lab Results  Component Value Date   NA 135 01/08/2019   K 4.6 01/08/2019   CL 94 (L) 01/08/2019   CO2 33 (H) 01/08/2019   CREATININE 0.81 01/08/2019   GLUCOSE 90 01/08/2019      Diagnostic Studies: Dg Chest 1 View  Result Date: 12/24/2018 CLINICAL DATA:  85 year old female with a history of shortness of breath, recent repair of acute aortic syndrome EXAM: CHEST  1 VIEW COMPARISON:  12/23/2018 FINDINGS: Cardiomediastinal silhouette unchanged with surgical changes of median sternotomy. Low lung volumes with hazy opacities bilaterally. Blunting of the right costophrenic angle and left costophrenic angle. No pneumothorax. Unchanged right IJ sheath. Minimally displaced right rib fracture. Penetration of the lower chest is limited for evaluation of mediastinal drains/epicardial pacing leads. IMPRESSION: Low lung volumes with hazy opacities bilateral lower lungs, potentially atelectasis, trace pleural fluid, and/or developing edema. Surgical changes of median sternotomy, and surgical staples projecting over the right chest wall. Unchanged right IJ  sheath. Electronically Signed   By: Gilmer Mor D.O.   On: 12/24/2018 08:46   Dg Chest 2 View  Result Date: 01/04/2019 CLINICAL DATA:  History of aortic dissection. EXAM: CHEST - 2 VIEW COMPARISON:  Chest radiograph 01/02/2019 FINDINGS: Stable enlarged cardiac and mediastinal contours status post median sternotomy. Surgical staple line overlies the right hemithorax. Persistent left lower lung patchy consolidation and small effusion. Small right pleural effusion and underlying consolidation. No definite pneumothorax. IMPRESSION: Cardiomegaly. Persistent small effusions and underlying consolidation. Electronically Signed   By: Annia Belt M.D.   On: 01/04/2019 09:55   Dg Chest 2 View  Result Date: 12/25/2018 CLINICAL DATA:  Pleural effusion short of breath. Replacement ascending aorta 12/20/2018 EXAM: CHEST - 2 VIEW COMPARISON:  12/24/2018 FINDINGS: Progression of right pleural effusion progression of right lower lobe consolidation. Small left effusion unchanged. Negative for pneumothorax. Cardiac enlargement with vascular congestion unchanged. IMPRESSION: Progression of right pleural effusion and right lower lobe consolidation. No pneumothorax. Electronically Signed   By: Marlan Palau M.D.   On: 12/25/2018 13:23   Ct Head Wo Contrast  Result Date: 12/28/2018 CLINICAL DATA:  Ataxia and altered mental status following repair of aortic dissection. Stroke suspected. EXAM: CT HEAD WITHOUT CONTRAST TECHNIQUE: Contiguous axial images were obtained from the base of the skull through the  vertex without intravenous contrast. COMPARISON:  None. FINDINGS: Brain: Moderate generalized atrophy and white matter disease is present bilaterally. There is some asymmetry white matter disease on the left. No acute or focal cortical abnormality is present. Basal ganglia are intact. Insular ribbon is normal bilaterally. The ventricles are of normal size. No significant extraaxial fluid collection is present. Vascular:  Atherosclerotic calcifications are present within the cavernous internal carotid arteries bilaterally. There is no hyperdense vessel. Skull: Calvarium is intact. No focal lytic or blastic lesions are present. Sinuses/Orbits: The paranasal sinuses and mastoid air cells are clear. The globes and orbits are within normal limits. IMPRESSION: 1. Extensive white matter hypoattenuation bilaterally. While this is likely chronic. White matter infarcts may be obscured. 2. No acute or focal cortical infarct. 3. Atherosclerosis. Electronically Signed   By: Marin Roberts M.D.   On: 12/28/2018 10:17   Ct Chest Wo Contrast  Result Date: 12/26/2018 CLINICAL DATA:  Followup for thoracic aortic aneurysm. Patient underwent ascending aorta surgery on 12/20/2018. EXAM: CT CHEST WITHOUT CONTRAST TECHNIQUE: Multidetector CT imaging of the chest was performed following the standard protocol without IV contrast. COMPARISON:  12/20/2018 FINDINGS: Cardiovascular: Ascending aorta graft extends from just above the aortic valve to just proximal to the origin of the innominate artery. Graft measures 3.2 cm anterior-posterior. Just distal to the graft, proximal arch is dilated to approximately 4 cm. The arch decreases caliber becoming normal in caliber throughout the descending thoracic aorta. Atherosclerotic calcifications are noted across the arch and descending thoracic aorta, stable. Fluid attenuation surrounds the ascending aorta extending to the posterior margin of the sternum. This fluid has Hounsfield units averaging around 10. The pericardial effusion noted on the prior exam is mostly resolved with only a minimal amount of pericardial fluid evident. Heart is mildly enlarged. There are three-vessel coronary artery calcifications. Enlarged main pulmonary artery measuring 4.1 cm, stable. Mediastinum/Nodes: Right internal jugular central venous line. Tip projects in the upper superior vena cava. No mediastinal or hilar masses. No  enlarged lymph nodes. Trachea is patent. Esophagus is grossly unremarkable. Lungs/Pleura: Small right and minimal left pleural effusions. Dependent lower lobe opacities are noted consistent with atelectasis. Additional opacity is noted right upper lobe adjacent to the minor fissure also consistent with atelectasis. Bilateral interstitial thickening. No pneumothorax. Upper Abdomen: No acute abnormality. Musculoskeletal: Changes from the recent median sternotomy. No bone lesions. IMPRESSION: 1. Status post repair of an ascending thoracic aortic aneurysm. Graft extends above the aortic valve to just proximal to the origin the innominate artery. Graft measures 3.2 cm anterior-posterior. 2. There is fluid surrounding ascending aorta and throughout much of the anterior mediastinum. The previously seen pericardial effusion is mostly resolved. 3. Small right and minimal left pleural effusions. Also interstitial thickening in the lungs. Mild interstitial pulmonary edema suspected. 4. Lungs also demonstrate dependent atelectasis mostly in the lower lobes. 5. No pneumothorax. Aortic Atherosclerosis (ICD10-I70.0). Electronically Signed   By: Amie Portland M.D.   On: 12/26/2018 17:36   Dg Chest Port 1 View  Result Date: 01/02/2019 CLINICAL DATA:  S/p sternotomy. EXAM: PORTABLE CHEST 1 VIEW COMPARISON:  12/29/2018 FINDINGS: Post median sternotomy and right thoracotomy with skin staples and sternotomy wires in place as before. Cardiomediastinal contours remain markedly enlarged with signs of effusion on the right and bibasilar consolidation unchanged from previous exam. Lung volumes remain low IMPRESSION: No interval change in the appearance of the chest with persistent right pleural effusion and bibasilar consolidation. Electronically Signed   By: Donzetta Kohut  M.D.   On: 01/02/2019 08:31   Dg Chest Port 1 View  Result Date: 12/29/2018 CLINICAL DATA:  Shortness of breath was respiratory failure EXAM: PORTABLE CHEST 1  VIEW COMPARISON:  December 28, 2018 FINDINGS: The lung volumes are low. The heart size remains enlarged. There are small to moderate-sized bilateral pleural effusions with adjacent airspace opacities favored to represent atelectasis. There is no pneumothorax. Vascular congestion is noted. Skin staples are projected over the right upper chest wall. IMPRESSION: Stable appearance of the chest when compared to 1 day prior. Electronically Signed   By: Katherine Mantle M.D.   On: 12/29/2018 05:57   Dg Chest Port 1 View  Result Date: 12/28/2018 CLINICAL DATA:  Pleural effusion EXAM: PORTABLE CHEST 1 VIEW COMPARISON:  Chest x-rays dated 12/26/2018 and 12/25/2018. Chest CT dated 12/26/2018. FINDINGS: Stable cardiomegaly. Stable bibasilar opacities, compatible with the combination of atelectasis and small pleural effusions demonstrated on chest CT of 12/26/2018. No new lung findings. No pneumothorax is seen. Median sternotomy wires appear intact and stable in alignment. IMPRESSION: Stable chest x-ray. Stable bibasilar opacities, compatible with the combination of atelectasis and small pleural effusions demonstrated on chest CT of 10/30/202020. Electronically Signed   By: Bary Richard M.D.   On: 12/28/2018 08:50   Dg Chest Port 1 View  Result Date: 12/26/2018 CLINICAL DATA:  Shortness of breath, coronary bypass EXAM: PORTABLE CHEST 1 VIEW COMPARISON:  12/25/2018 FINDINGS: Right IJ vascular sheath tip at the innominate venous confluence level. The vascular sheath is kinked at the neck. Median sternotomy changes noted. Heart is enlarged with similar bibasilar atelectasis/consolidation and bilateral pleural effusions, worse on the right. No pneumothorax. Aorta atherosclerotic. No significant interval change. IMPRESSION: Stable cardiomegaly, bibasilar atelectasis/consolidation and pleural effusions. Little interval change. No pneumothorax Electronically Signed   By: Judie Petit.  Shick M.D.   On: 12/26/2018 09:59   Dg Chest  Port 1 View  Result Date: 12/23/2018 CLINICAL DATA:  Thoracic aortic aneurysm. EXAM: PORTABLE CHEST 1 VIEW COMPARISON:  December 22, 2018. FINDINGS: Stable cardiomegaly. Right internal jugular venous sheath is noted. Right-sided chest tube is noted without pneumothorax. No pneumothorax or pleural effusion is noted. Minimal bibasilar subsegmental atelectasis is noted. Bony thorax is unremarkable. IMPRESSION: Stable right-sided chest tube without pneumothorax. Minimal bibasilar subsegmental atelectasis. Electronically Signed   By: Lupita Raider M.D.   On: 12/23/2018 07:33   Dg Chest Port 1 View  Result Date: 12/22/2018 CLINICAL DATA:  Aortic aneurysm repair with sore chest EXAM: PORTABLE CHEST 1 VIEW COMPARISON:  Yesterday FINDINGS: Extubation with essentially stable lung volumes. Similar degree of atelectasis at the bases. No visible pleural effusion or pneumothorax. Stable heart size when accounting for differences in rotation. Chest tubes and right IJ sheath remain. IMPRESSION: Extubation with stable lung volumes and atelectasis. Electronically Signed   By: Marnee Spring M.D.   On: 12/22/2018 07:18   Dg Chest Port 1 View  Result Date: 12/21/2018 CLINICAL DATA:  Postop aortic aneurysm repair EXAM: PORTABLE CHEST 1 VIEW COMPARISON:  12/20/2018 FINDINGS: Endotracheal tube in good position. Swan-Ganz catheter in the main pulmonary artery. Bilateral chest tubes in place. No pneumothorax Left lower lobe atelectasis. Right perihilar atelectasis. Possible small left effusion. NG tube in the stomach. IMPRESSION: Support lines in good position. No pneumothorax postop median sternotomy. Bibasilar atelectasis left greater than right. Electronically Signed   By: Marlan Palau M.D.   On: 12/21/2018 07:54   Dg Chest Portable 1 View  Result Date: 12/20/2018 CLINICAL DATA:  Chest  pain EXAM: PORTABLE CHEST 1 VIEW COMPARISON:  None. FINDINGS: There is mild cardiomegaly. Mildly increased interstitial markings  seen within the left mid lung and left lower lung. Streaky atelectasis seen at the right lung base. No pleural effusion. No acute osseous abnormality. IMPRESSION: Nonspecific increased interstitial markings at the left lower lung. This could be due to atelectasis, chronic lung changes, and/or early infectious etiology. Electronically Signed   By: Prudencio Pair M.D.   On: 12/20/2018 19:17   Ct Angio Chest/abd/pel For Dissection W And/or Wo Contrast  Result Date: 12/20/2018 CLINICAL DATA:  85 year old female with ongoing chest pain radiating to her shoulder blade. New onset of shortness of breath. EXAM: CT ANGIOGRAPHY CHEST, ABDOMEN AND PELVIS TECHNIQUE: Multidetector CT imaging through the chest, abdomen and pelvis was performed using the standard protocol during bolus administration of intravenous contrast. Multiplanar reconstructed images and MIPs were obtained and reviewed to evaluate the vascular anatomy. CONTRAST:  16mL OMNIPAQUE IOHEXOL 350 MG/ML SOLN COMPARISON:  Chest radiograph dated 12/20/2018. FINDINGS: CTA CHEST FINDINGS Cardiovascular: There is no cardiomegaly. There is a large pericardial effusion measuring up to 3.7 cm in thickness anterior to the heart. There is associated slight mass effect and flattening of the anterior wall of the right ventricle. Higher attenuation of the pericardial fluid most consistent with hemopericardium. They are is a crescentic high attenuating density starting from the aortic root and extending to the proximal aortic arch most consistent with intramural hematoma. This appears to extend into the brachiocephalic trunk. The visualized origins of the great vessels of the aortic arch otherwise remain patent. There is a penetrating ulcer or a pseudoaneurysm along the medial wall of the ascending aorta measuring approximately 1 cm in depth and 3 cm in length (coronal series 11, image 67 and axial series 8, image 50). There is a focal area of heterogeneous density and  opacification involving the proximal descending thoracic aorta (series 8, image 80 and coronal series 11 images 107-115). This may represent an area of intimal injury, or developing clot. No definite extraluminal contrast identified. Mediastinum/Nodes: There is no hilar or mediastinal adenopathy. The esophagus and the thyroid gland are grossly unremarkable. Lungs/Pleura: Trace bilateral pleural effusions. There are bibasilar subsegmental atelectasis as well as diffuse linear and hazy densities throughout the lungs most consistent with atelectatic changes or areas of air trapping. No consolidative changes. No pneumothorax. The central airways are patent. Musculoskeletal: There is degenerative changes of the spine. Multilevel disc desiccation and vacuum phenomena. No acute osseous pathology. Review of the MIP images confirms the above findings. CTA ABDOMEN AND PELVIS FINDINGS VASCULAR Aorta: Mild atherosclerotic calcification. No aneurysmal dilatation or dissection. Celiac: Patent without evidence of aneurysm, dissection, vasculitis or significant stenosis. SMA: Patent without evidence of aneurysm, dissection, vasculitis or significant stenosis. Renals: Both renal arteries are patent without evidence of aneurysm, dissection, vasculitis, fibromuscular dysplasia or significant stenosis. IMA: Patent without evidence of aneurysm, dissection, vasculitis or significant stenosis. Inflow: Patent without evidence of aneurysm, dissection, vasculitis or significant stenosis. Veins: No obvious venous abnormality within the limitations of this arterial phase study. Review of the MIP images confirms the above findings. NON-VASCULAR No intra-abdominal free air or free fluid. Hepatobiliary: The liver and gallbladder appear unremarkable. Pancreas: Unremarkable. No pancreatic ductal dilatation or surrounding inflammatory changes. Spleen: Normal in size without focal abnormality. Adrenals/Urinary Tract: The adrenal glands are  unremarkable. Minimal fullness of the left renal collecting systems versus parapelvic cyst. The right kidney is unremarkable. The visualized ureters appear unremarkable. The urinary bladder  is collapsed. Stomach/Bowel: There is no bowel obstruction or active inflammation. The appendix is not visualized with certainty. No inflammatory changes identified in the right lower quadrant. Lymphatic: No adenopathy. Reproductive: Hysterectomy. No pelvic mass. Other: None Musculoskeletal: Osteopenia with multilevel degenerative changes of the spine. Multilevel disc desiccation and vacuum phenomena. No acute osseous pathology. Review of the MIP images confirms the above findings. IMPRESSION: 1. Findings most consistent with active intramural hemorrhage involving the aortic root, ascending aorta and proximal aortic arch with a large hemopericardium. Clinical correlation is recommended to evaluate for cardiac tamponade. 2. Focal penetrating ulcer or pseudoaneurysm of the medial wall of the distal ascending aorta. 3. Focal heterogeneity of intraluminal contrast in the proximal descending thoracic aorta may represent underlying intimal injury. 4. No acute intra-abdominal or pelvic pathology. These results were called by telephone at the time of interpretation on 12/20/2018 at 8:00 pm to provider Excell Seltzer, who verbally acknowledged these results. Electronically Signed   By: Elgie Collard M.D.   On: 12/20/2018 20:36     Discharge Medications: Allergies as of 01/08/2019   No Known Allergies     Medication List    TAKE these medications   amiodarone 200 MG tablet Commonly known as: PACERONE Take 1 tablet (200 mg total) by mouth 2 (two) times daily. For one week;then take 200 mg daily thereafter   apixaban 5 MG Tabs tablet Commonly known as: ELIQUIS Take 1 tablet (5 mg total) by mouth 2 (two) times daily.   aspirin 81 MG chewable tablet Chew 1 tablet (81 mg total) by mouth daily.   furosemide 40 MG  tablet Commonly known as: LASIX Take 1 tablet (40 mg total) by mouth 2 (two) times daily. For 4 days then take 40 mg daily thereafter   levothyroxine 150 MCG tablet Commonly known as: SYNTHROID Take 150 mcg by mouth daily before breakfast.   metoprolol tartrate 25 MG tablet Commonly known as: LOPRESSOR Take 1 tablet (25 mg total) by mouth 2 (two) times daily.   multivitamin with minerals Tabs tablet Take 1 tablet by mouth daily.   Potassium Chloride ER 20 MEQ Tbcr Take 20 mEq by mouth daily.            Durable Medical Equipment  (From admission, onward)         Start     Ordered   01/08/19 0748  For home use only DME oxygen  Once    Comments: Patient needs oxygen with ambulation  Question Answer Comment  Length of Need 6 Months   Mode or (Route) Nasal cannula   Liters per Minute 2      01/08/19 0748   01/06/19 1017  For home use only DME 4 wheeled rolling walker with seat  Once    Comments: Post op  Question:  Patient needs a walker to treat with the following condition  Answer:  Weakness generalized   01/06/19 1016   01/06/19 0837  For home use only DME Hospital bed  Once    Question Answer Comment  Length of Need 6 Months   The above medical condition requires: Patient requires the ability to reposition frequently   Bed type Semi-electric      01/06/19 0836   01/06/19 0836  For home use only DME 3 n 1  Once     01/06/19 0836   01/06/19 0836  For home use only DME oxygen  Once    Question Answer Comment  Length of Need 6 Months  Mode or (Route) Nasal cannula   Liters per Minute 2   Frequency Continuous (stationary and portable oxygen unit needed)   Oxygen delivery system Gas      01/06/19 0836         The patient has been discharged on:   1.Beta Blocker:  Yes [  x ]                              No   [   ]                              If No, reason:  2.Ace Inhibitor/ARB: Yes [   ]                                     No  [ x   ]                                      If No, reason:Somewhat labile BP  3.Statin:   Yes [   ]                  No  [   ]                  If No, reason:No CAD  4.Marlowe Kays:  Yes  [ x  ]                  No   [   ]                  If No, reason:  Follow Up Appointments: Follow-up Information    Linden Dolin, MD. Go on 01/16/2019.   Specialty: Cardiothoracic Surgery Why: PA/LAT CXR to be taken (at [facility] which is in the same building as Dr. Vickey Sages' office) on 11/20 at 1:00 pm;Appointment time is at 1:30 pm Contact information: [address] [place] [address] [phone]        [facility] Follow up.   Why: 3n1 and rollator have been delivered to room       Care, [facility] Follow up.   Specialty: Home Health Services Why: HHRN/PT- arranged- they will contact you to set up home visits Contact information: [address] [place] [address] [phone]        Thomasene Ripple, DO. Go on 01/15/2019.   Specialty: Cardiology Why: Appointment is at 11:20 am Contact information: [address] [place] [address] [phone]           Signed: Lelon Huh [facility] 01/08/2019, 8:46 AM

## 2018-12-23 NOTE — Progress Notes (Addendum)
Kaitlyn Rodriguez was examined at bedside this morning and she is doing well without any complaints or shortness of breath.  Overnight, there were reports that she was agitated and tried to pull out her IV lines and chest tube.  During my assessment this morning, she was sitting by the bedside recliner and was alert and oriented to name and place but not to time.  Echocardiogram from yesterday reviewed the ventricular EF of 35-40% with no evidence of cardiac tamponade (no prior imaging).   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  Moving forward, will treat symptomatically for volume overload as they arise.  Signed Kaitlyn Rosenthal, MD  Internal medicine teaching service-PGY 2  Saw and evaluated the patient this morning along with the resident physician.  Major issues currently seem to be sundowning/delirium in the evening.  Echo does show moderate reduced function with what appears to be global hypokinesis no obvious regional wall motion rise commented on.  Valves look stable.  Is currently on Lopressor postop, would convert to Toprol on discharge for better management of low EF  Expected post procedure diuresis, does not seem volume overloaded. As blood pressure tolerates with anticipate starting ARB prior to discharge with thoughts of potentially converting to [facility] in the outpatient.   Kaitlyn Hew, MD

## 2018-12-23 NOTE — Progress Notes (Signed)
Pt became very agitate and confused. Got out of bed, pulled foley out & attempted to pull chest tubes out as well.   Will continue to monitor.

## 2018-12-24 ENCOUNTER — Other Ambulatory Visit: Payer: Self-pay

## 2018-12-24 ENCOUNTER — Inpatient Hospital Stay (HOSPITAL_COMMUNITY): Payer: Medicare Other

## 2018-12-24 LAB — TYPE AND SCREEN
ABO/RH(D): O POS
Antibody Screen: NEGATIVE
Unit division: 0
Unit division: 0
Unit division: 0
Unit division: 0
Unit division: 0

## 2018-12-24 LAB — CBC WITH DIFFERENTIAL/PLATELET
Abs Immature Granulocytes: 0 10*3/uL (ref 0.00–0.07)
Basophils Absolute: 0 10*3/uL (ref 0.0–0.1)
Basophils Relative: 0 %
Eosinophils Absolute: 0 10*3/uL (ref 0.0–0.5)
Eosinophils Relative: 0 %
HCT: 25.6 % — ABNORMAL LOW (ref 36.0–46.0)
Hemoglobin: 7.6 g/dL — ABNORMAL LOW (ref 12.0–15.0)
Lymphocytes Relative: 2 %
Lymphs Abs: 0.4 10*3/uL — ABNORMAL LOW (ref 0.7–4.0)
MCH: 23.9 pg — ABNORMAL LOW (ref 26.0–34.0)
MCHC: 29.7 g/dL — ABNORMAL LOW (ref 30.0–36.0)
MCV: 80.5 fL (ref 80.0–100.0)
Monocytes Absolute: 1 10*3/uL (ref 0.1–1.0)
Monocytes Relative: 5 %
Neutro Abs: 19 10*3/uL — ABNORMAL HIGH (ref 1.7–7.7)
Neutrophils Relative %: 93 %
Platelets: 155 10*3/uL (ref 150–400)
RBC: 3.18 MIL/uL — ABNORMAL LOW (ref 3.87–5.11)
RDW: 21.2 % — ABNORMAL HIGH (ref 11.5–15.5)
WBC: 20.4 10*3/uL — ABNORMAL HIGH (ref 4.0–10.5)
nRBC: 0 % (ref 0.0–0.2)
nRBC: 0 /100 WBC

## 2018-12-24 LAB — GLUCOSE, CAPILLARY
Glucose-Capillary: 102 mg/dL — ABNORMAL HIGH (ref 70–99)
Glucose-Capillary: 103 mg/dL — ABNORMAL HIGH (ref 70–99)
Glucose-Capillary: 104 mg/dL — ABNORMAL HIGH (ref 70–99)
Glucose-Capillary: 166 mg/dL — ABNORMAL HIGH (ref 70–99)
Glucose-Capillary: 173 mg/dL — ABNORMAL HIGH (ref 70–99)
Glucose-Capillary: 97 mg/dL (ref 70–99)

## 2018-12-24 LAB — BPAM RBC
Blood Product Expiration Date: 202011252359
Blood Product Expiration Date: 202011262359
Blood Product Expiration Date: 202011262359
Blood Product Expiration Date: 202011262359
Blood Product Expiration Date: 202011262359
ISSUE DATE / TIME: 202010242059
ISSUE DATE / TIME: 202010242318
ISSUE DATE / TIME: 202010242318
ISSUE DATE / TIME: 202010242318
ISSUE DATE / TIME: 202010242318
Unit Type and Rh: 5100
Unit Type and Rh: 5100
Unit Type and Rh: 5100
Unit Type and Rh: 5100
Unit Type and Rh: 5100

## 2018-12-24 LAB — BASIC METABOLIC PANEL
Anion gap: 8 (ref 5–15)
BUN: 14 mg/dL (ref 8–23)
CO2: 25 mmol/L (ref 22–32)
Calcium: 7 mg/dL — ABNORMAL LOW (ref 8.9–10.3)
Chloride: 98 mmol/L (ref 98–111)
Creatinine, Ser: 0.82 mg/dL (ref 0.44–1.00)
GFR calc Af Amer: >60 mL/min (ref >60)
GFR calc non Af Amer: >60 mL/min (ref >60)
Glucose, Bld: 181 mg/dL — ABNORMAL HIGH (ref 70–99)
Potassium: 3.9 mmol/L (ref 3.5–5.1)
Sodium: 131 mmol/L — ABNORMAL LOW (ref 135–145)

## 2018-12-24 LAB — PREPARE RBC (CROSSMATCH)

## 2018-12-24 MED ORDER — AMIODARONE IV BOLUS ONLY 150 MG/100ML
150.0000 mg | Freq: Once | INTRAVENOUS | Status: DC
  Filled 2018-12-24: qty 100

## 2018-12-24 MED ORDER — CALCIUM GLUCONATE-NACL 1-0.675 GM/50ML-% IV SOLN
1.0000 g | Freq: Once | INTRAVENOUS | Status: AC
  Administered 2018-12-24: 1000 mg via INTRAVENOUS
  Filled 2018-12-24: qty 50

## 2018-12-24 MED ORDER — FUROSEMIDE 10 MG/ML IJ SOLN: 20.0000 mg | Freq: Two times a day (BID) | INTRAMUSCULAR | Status: DC

## 2018-12-24 MED ORDER — METOPROLOL TARTRATE 25 MG/10 ML ORAL SUSPENSION
25.0000 mg | Freq: Two times a day (BID) | ORAL | Status: DC
  Administered 2018-12-25: 25 mg
  Filled 2018-12-24 (×5): qty 10

## 2018-12-24 MED ORDER — ALBUMIN HUMAN 5 % IV SOLN
12.5000 g | Freq: Once | INTRAVENOUS | Status: AC
  Administered 2018-12-24: 12.5 g via INTRAVENOUS
  Filled 2018-12-24: qty 250

## 2018-12-24 MED ORDER — FUROSEMIDE 10 MG/ML IJ SOLN
40.0000 mg | Freq: Once | INTRAMUSCULAR | Status: AC
  Administered 2018-12-24: 40 mg via INTRAVENOUS
  Filled 2018-12-24: qty 4

## 2018-12-24 MED ORDER — AMIODARONE HCL IN DEXTROSE 360-4.14 MG/200ML-% IV SOLN
60.0000 mg/h | INTRAVENOUS | Status: DC
  Administered 2018-12-24: 60 mg/h via INTRAVENOUS
  Administered 2018-12-24: 36 mg/h via INTRAVENOUS
  Filled 2018-12-24: qty 200

## 2018-12-24 MED ORDER — METOPROLOL TARTRATE 25 MG PO TABS
25.0000 mg | ORAL_TABLET | Freq: Two times a day (BID) | ORAL | Status: DC
  Administered 2018-12-24 – 2018-12-29 (×5): 25 mg via ORAL
  Filled 2018-12-24 (×10): qty 1

## 2018-12-24 MED ORDER — ALBUMIN HUMAN 5 % IV SOLN
12.5000 g | Freq: Once | INTRAVENOUS | Status: AC
  Administered 2018-12-24: 12.5 g via INTRAVENOUS
  Filled 2018-12-24 (×2): qty 250

## 2018-12-24 MED ORDER — AMIODARONE HCL IN DEXTROSE 360-4.14 MG/200ML-% IV SOLN
30.0000 mg/h | INTRAVENOUS | Status: DC
  Administered 2018-12-24 – 2018-12-25 (×2): 30 mg/h via INTRAVENOUS
  Filled 2018-12-24 (×3): qty 200

## 2018-12-24 MED ORDER — POTASSIUM CHLORIDE CRYS ER 20 MEQ PO TBCR
20.0000 meq | EXTENDED_RELEASE_TABLET | Freq: Every day | ORAL | Status: DC
  Administered 2018-12-25 – 2018-12-30 (×4): 20 meq via ORAL
  Filled 2018-12-24 (×8): qty 1

## 2018-12-24 MED ORDER — SODIUM CHLORIDE 0.9% IV SOLUTION: Freq: Once | INTRAVENOUS | Status: DC

## 2018-12-24 MED ORDER — AMIODARONE LOAD VIA INFUSION
150.0000 mg | Freq: Once | INTRAVENOUS | Status: AC
  Administered 2018-12-24: 150 mg via INTRAVENOUS
  Filled 2018-12-24: qty 83.34

## 2018-12-24 NOTE — Progress Notes (Signed)
Pt's still hypotensive after receiving albumin. Briefly 104/64 then SBP was 70's, last one was 77/57. Pt denies dizziness or light- headed, but she is lethargy with answered the questions. Notified TCTS PA Myron regarding this matter. He will put in more albumin for correct hypotensive. Continue to monitor BP. HS [employer]

## 2018-12-24 NOTE — Progress Notes (Signed)
PT Cancellation Note  Patient Details Name: Kaitlyn Rodriguez MRN: [id number] DOB: 1933/03/18   Cancelled Treatment:    Reason Eval/Treat Not Completed: Medical issues which prohibited therapy. PT attempted to see patient for treatment however nurse reporting pt inappropriate at this time, hypotensive (80s/50s) and receiving blood products at this time. PT will hold until pt is more medically appropriate for PT intervention.  Zenaida Niece, PT, DPT Acute Rehabilitation Pager: [phone]    Zenaida Niece 12/24/2018, 2:45 PM

## 2018-12-24 NOTE — Progress Notes (Signed)
      [address]       [name]             [phone]        4 Days Post-Op Procedure(s) (LRB): REPAIR OF TYPE A AORTIC DISSECTION USING HEMASHIELD PLATINUM 28MM AND 32MM GRAFT (N/A) STERNAL PLATING  Subjective: Events of earlier this am noted. She states she is sluggish this morning. She had some breakfast and is drinking juice this am.  Objective: Vital signs in last 24 hours: Temp:  [98.2 F (36.8 C)-99.9 F (37.7 C)] 98.9 F (37.2 C) (10/28 0318) Pulse Rate:  [72-98] 74 (10/28 0318) Cardiac Rhythm: Atrial fibrillation (10/28 0430) Resp:  [15-28] 22 (10/28 0630) BP: (99-124)/(65-81) 107/69 (10/28 0630) SpO2:  [88 %-100 %] 95 % (10/28 0318) Weight:  [102.5 kg] 102.5 kg (10/28 0500)  Pre op weight  86.2 kg Current Weight  12/24/18 102.5 kg    Intake/Output from previous day: 10/27 0701 - 10/28 0700 In: 344.5 [P.O.:200; I.V.:24; IV Piggyback:120.6] Out: 10 [Chest Tube:10]   Physical Exam:  Cardiovascular: IRRR IRRR Pulmonary: Slightly diminished bibasilar breath sounds Abdomen: Soft, non tender, bowel sounds present. Extremities: Mild bilateral lower extremity edema. Wounds: Aquacel removed and wounds are clean and dry.  No erythema or signs of infection.  Lab Results: CBC: Recent Labs    12/22/18 1900 12/23/18 0312  WBC 13.9* 13.9*  HGB 8.4* 8.4*  HCT 27.2* 27.7*  PLT 163 175   BMET:  Recent Labs    12/22/18 1900 12/23/18 0312  NA 134* 132*  K 3.6 4.3  CL 100 99  CO2 25 26  GLUCOSE 129* 106*  BUN 20 16  CREATININE 0.71 0.75  CALCIUM 7.8* 7.7*    PT/INR:  Lab Results  Component Value Date   INR 2.1 (H) 12/21/2018   INR 1.3 (H) 12/20/2018   ABG:  INR: Will add last result for INR, ABG once components are confirmed Will add last 4 CBG results once components are confirmed  Assessment/Plan:  1. CV - A fib with HR in the 110's. She was hypotensive earlier this am so given Albumin. On Amiodarone drip and  Lopressor 25 mg bid (with parameters). Systolic BP still in high 70's so will be given another Albumin. Also, being given Calcium 2.  Pulmonary - On 2 liters of oxygen via [place]. Wean to room air as able. Check PA/LAT CXR in am. Encourage incentive spirometer. 3. Volume Overload - On Lasix 20 mg IV bid. Will hold today as hypotensive 4.  Acute blood loss anemia - H and H yesterday stable at 8.4 and 27.7 5. Hypothyroidism-on Levothyroxine 150 mcg daily. Will check TSH given new a fib 6. Likely remove EPW in am  Kaitlyn Kamen M ZimmermanPA-C 12/24/2018,7:51 AM

## 2018-12-24 NOTE — Progress Notes (Signed)
Chaplain saw from door that patient yet was sleeping. Spoke with nurse at front desk who said patients' Blood pressure is low  and she is not alert enough to be able to do AD at this time but likely will feel better and be able to do it this afternoon.  Conard Novak, Chaplain   12/24/18 1000  Clinical Encounter Type  Visited With Health care provider  Visit Type Initial  Referral From Physician  Consult/Referral To Chaplain

## 2018-12-24 NOTE — Progress Notes (Signed)
Chaplain delivered information concerning advanced directives and briefly talked with the patient about the living will and healthcare power of attorney.  The patient expressed worry concerning the information and the chaplain talked about the AD was for the future not necessarily their current predicament.  The patient expressed gratitude for the chaplain coming.  The chaplain will follow-up if the completion of the AD is needed.  Brion Aliment Chaplain Resident For questions concerning this note please contact me by pager [phone]

## 2018-12-24 NOTE — Progress Notes (Signed)
At 0409 pt heart rhythm changed from NSR to a.fib RVR. EKG completed and BP was 113/89. BP started to decrease as low 61/50. On call MD notified. Amiodarone and Albumin ordered. Pt lethargic but oriented. Will continue to monitor.

## 2018-12-25 ENCOUNTER — Inpatient Hospital Stay (HOSPITAL_COMMUNITY): Payer: Medicare Other

## 2018-12-25 DIAGNOSIS — I4891 Unspecified atrial fibrillation: Secondary | ICD-10-CM

## 2018-12-25 LAB — COMPREHENSIVE METABOLIC PANEL
ALT: 61 U/L — ABNORMAL HIGH (ref 0–44)
AST: 17 U/L (ref 15–41)
Albumin: 2.3 g/dL — ABNORMAL LOW (ref 3.5–5.0)
Alkaline Phosphatase: 68 U/L (ref 38–126)
Anion gap: 9 (ref 5–15)
BUN: 23 mg/dL (ref 8–23)
CO2: 27 mmol/L (ref 22–32)
Calcium: 8.3 mg/dL — ABNORMAL LOW (ref 8.9–10.3)
Chloride: 96 mmol/L — ABNORMAL LOW (ref 98–111)
Creatinine, Ser: 0.95 mg/dL (ref 0.44–1.00)
GFR calc Af Amer: >60 mL/min (ref >60)
GFR calc non Af Amer: 55 mL/min — ABNORMAL LOW (ref >60)
Glucose, Bld: 116 mg/dL — ABNORMAL HIGH (ref 70–99)
Potassium: 4.7 mmol/L (ref 3.5–5.1)
Sodium: 132 mmol/L — ABNORMAL LOW (ref 135–145)
Total Bilirubin: 0.7 mg/dL (ref 0.3–1.2)
Total Protein: 5 g/dL — ABNORMAL LOW (ref 6.5–8.1)

## 2018-12-25 LAB — ECHO INTRAOPERATIVE TEE
Height: 65 in
Weight: 3657.87 oz

## 2018-12-25 LAB — GLUCOSE, CAPILLARY
Glucose-Capillary: 105 mg/dL — ABNORMAL HIGH (ref 70–99)
Glucose-Capillary: 107 mg/dL — ABNORMAL HIGH (ref 70–99)
Glucose-Capillary: 113 mg/dL — ABNORMAL HIGH (ref 70–99)
Glucose-Capillary: 122 mg/dL — ABNORMAL HIGH (ref 70–99)
Glucose-Capillary: 125 mg/dL — ABNORMAL HIGH (ref 70–99)
Glucose-Capillary: 81 mg/dL (ref 70–99)

## 2018-12-25 LAB — BPAM RBC
Blood Product Expiration Date: 202012022359
ISSUE DATE / TIME: 202010281348
Unit Type and Rh: 5100

## 2018-12-25 LAB — CBC
HCT: 31.6 % — ABNORMAL LOW (ref 36.0–46.0)
Hemoglobin: 9.2 g/dL — ABNORMAL LOW (ref 12.0–15.0)
MCH: 24 pg — ABNORMAL LOW (ref 26.0–34.0)
MCHC: 29.1 g/dL — ABNORMAL LOW (ref 30.0–36.0)
MCV: 82.5 fL (ref 80.0–100.0)
Platelets: 207 10*3/uL (ref 150–400)
RBC: 3.83 MIL/uL — ABNORMAL LOW (ref 3.87–5.11)
RDW: 21.5 % — ABNORMAL HIGH (ref 11.5–15.5)
WBC: 16.1 10*3/uL — ABNORMAL HIGH (ref 4.0–10.5)
nRBC: 0.1 % (ref 0.0–0.2)

## 2018-12-25 LAB — BLOOD GAS, ARTERIAL
Acid-Base Excess: 2.9 mmol/L — ABNORMAL HIGH (ref 0.0–2.0)
Acid-Base Excess: 4.2 mmol/L — ABNORMAL HIGH (ref 0.0–2.0)
Acid-Base Excess: 4 mmol/L — ABNORMAL HIGH (ref 0.0–2.0)
Bicarbonate: 29.3 mmol/L — ABNORMAL HIGH (ref 20.0–28.0)
Bicarbonate: 29.9 mmol/L — ABNORMAL HIGH (ref 20.0–28.0)
Bicarbonate: 29.6 mmol/L — ABNORMAL HIGH (ref 20.0–28.0)
Drawn by: 129711
Drawn by: 350431
FIO2: 40
FIO2: 40
O2 Content: 3 L/min
O2 Saturation: 95.4 %
O2 Saturation: 97.3 %
O2 Saturation: 98.6 %
Patient temperature: 36.6
Patient temperature: 37
Patient temperature: 36.7
pCO2 arterial: 66.8 mmHg (ref 32.0–48.0)
pCO2 arterial: 59.9 mmHg — ABNORMAL HIGH (ref 32.0–48.0)
pCO2 arterial: 57 mmHg — ABNORMAL HIGH (ref 32.0–48.0)
pH, Arterial: 7.264 — ABNORMAL LOW (ref 7.350–7.450)
pH, Arterial: 7.318 — ABNORMAL LOW (ref 7.350–7.450)
pH, Arterial: 7.333 — ABNORMAL LOW (ref 7.350–7.450)
pO2, Arterial: 86.1 mmHg (ref 83.0–108.0)
pO2, Arterial: 98.6 mmHg (ref 83.0–108.0)
pO2, Arterial: 118 mmHg — ABNORMAL HIGH (ref 83.0–108.0)

## 2018-12-25 LAB — TYPE AND SCREEN
ABO/RH(D): O POS
Antibody Screen: NEGATIVE
Unit division: 0

## 2018-12-25 LAB — TSH: TSH: 2.046 u[IU]/mL (ref 0.350–4.500)

## 2018-12-25 MED ORDER — ORAL CARE MOUTH RINSE
15.0000 mL | Freq: Two times a day (BID) | OROMUCOSAL | Status: DC
  Administered 2018-12-25 – 2019-01-15 (×33): 15 mL via OROMUCOSAL

## 2018-12-25 MED ORDER — FUROSEMIDE 10 MG/ML IJ SOLN
40.0000 mg | Freq: Once | INTRAMUSCULAR | Status: AC
  Administered 2018-12-25: 40 mg via INTRAVENOUS
  Filled 2018-12-25: qty 4

## 2018-12-25 MED ORDER — LEVALBUTEROL HCL 0.63 MG/3ML IN NEBU
0.6300 mg | INHALATION_SOLUTION | Freq: Three times a day (TID) | RESPIRATORY_TRACT | Status: DC
  Administered 2018-12-26 – 2018-12-27 (×6): 0.63 mg via RESPIRATORY_TRACT
  Filled 2018-12-25 (×7): qty 3

## 2018-12-25 MED ORDER — APIXABAN 5 MG PO TABS
5.0000 mg | ORAL_TABLET | Freq: Two times a day (BID) | ORAL | Status: DC
  Administered 2018-12-25 – 2018-12-28 (×5): 5 mg via ORAL
  Filled 2018-12-25 (×8): qty 1

## 2018-12-25 MED ORDER — AMIODARONE HCL 200 MG PO TABS
200.0000 mg | ORAL_TABLET | Freq: Two times a day (BID) | ORAL | Status: DC
  Administered 2018-12-25 (×2): 200 mg via ORAL
  Filled 2018-12-25 (×2): qty 1

## 2018-12-25 MED ORDER — ACETAZOLAMIDE SODIUM 500 MG IJ SOLR
500.0000 mg | Freq: Four times a day (QID) | INTRAMUSCULAR | Status: AC
  Administered 2018-12-25 – 2018-12-26 (×3): 500 mg via INTRAVENOUS
  Filled 2018-12-25 (×3): qty 500

## 2018-12-25 MED ORDER — LEVALBUTEROL HCL 0.63 MG/3ML IN NEBU
0.6300 mg | INHALATION_SOLUTION | Freq: Three times a day (TID) | RESPIRATORY_TRACT | Status: DC | PRN
  Administered 2018-12-25: 0.63 mg via RESPIRATORY_TRACT

## 2018-12-25 MED ORDER — LEVALBUTEROL HCL 0.63 MG/3ML IN NEBU: 0.6300 mg | INHALATION_SOLUTION | Freq: Three times a day (TID) | RESPIRATORY_TRACT | Status: DC | PRN

## 2018-12-25 MED ORDER — LEVALBUTEROL HCL 0.63 MG/3ML IN NEBU
0.6300 mg | INHALATION_SOLUTION | Freq: Once | RESPIRATORY_TRACT | Status: AC
  Administered 2018-12-25: 0.63 mg via RESPIRATORY_TRACT
  Filled 2018-12-25: qty 3

## 2018-12-25 MED ORDER — LACTATED RINGERS IV BOLUS
500.0000 mL | Freq: Once | INTRAVENOUS | Status: AC
  Administered 2018-12-26: 500 mL via INTRAVENOUS

## 2018-12-25 MED FILL — Heparin Sodium (Porcine) Inj 1000 Unit/ML: INTRAMUSCULAR | Qty: 10 | Status: AC

## 2018-12-25 MED FILL — Mannitol IV Soln 20%: INTRAVENOUS | Qty: 500 | Status: AC

## 2018-12-25 MED FILL — Heparin Sodium (Porcine) Inj 1000 Unit/ML: INTRAMUSCULAR | Qty: 30 | Status: AC

## 2018-12-25 MED FILL — Sodium Chloride IV Soln 0.9%: INTRAVENOUS | Qty: 4000 | Status: AC

## 2018-12-25 MED FILL — Electrolyte-R (PH 7.4) Solution: INTRAVENOUS | Qty: 3000 | Status: AC

## 2018-12-25 MED FILL — Sodium Bicarbonate IV Soln 8.4%: INTRAVENOUS | Qty: 150 | Status: AC

## 2018-12-25 NOTE — Progress Notes (Signed)
RT NOTE: RT called to bedside to place patient on bipap per MD. Patient said she was comfortable on bipap. RN at bedside. RT will continue to monitor.

## 2018-12-25 NOTE — Plan of Care (Signed)
  Problem: Education: Goal: Knowledge of disease or condition will improve Outcome: Progressing   Problem: Cardiac: Goal: Will achieve and/or maintain hemodynamic stability Outcome: Progressing   Problem: Respiratory: Goal: Respiratory status will improve Outcome: Progressing   Problem: Skin Integrity: Goal: Wound healing without signs and symptoms of infection Outcome: Progressing   Problem: Urinary Elimination: Goal: Ability to achieve and maintain adequate renal perfusion and functioning will improve Outcome: Progressing   Problem: Health Behavior/Discharge Planning: Goal: Ability to manage health-related needs will improve Outcome: Progressing   Problem: Clinical Measurements: Goal: Respiratory complications will improve Outcome: Progressing   Problem: Clinical Measurements: Goal: Cardiovascular complication will be avoided Outcome: Progressing   Problem: Safety: Goal: Ability to remain free from injury will improve Outcome: Progressing

## 2018-12-25 NOTE — Significant Event (Signed)
Rapid Response Event Note  Overview: Neurologic - Ongoing Lethargy  Initial Focused Assessment: Asked by nurse to assess patient for increased lethargy. Per nurse, patient has been lethargic and "sluggish" all day. ABG was obtained this morning and it showed a PCO2 of 67 and patient was placed on BIPAP. Per nurse, patient stayed on BIPAP all day, currently on BIPAP 12/6. Upon arrival, patient was sleeping, woke to voice, able to follow simple commands but quickly fell back asleep. Pupils 3 mm bilateral, brisk, and equal. Patient appears quite fatigued and when I asked her how she felt or if she needed anything, she stated " I am tired, I am ready for this to be over, and I just want to be comfortable." VS are stable and she is breathing comfortably on the BIPAP  Interventions: -- Repeat ABG for 2000  Plan of Care: -- RN paged CVTS MD and updated him, will leave on BIPAP until CO2 improves, I recommend that remain on BIPAP overnight. Perhaps a small break for dinner if the patient wishes to eat. -- I am concerned that the patient is overall emotionally unwell, perhaps reaching out the [place] staff or Palliative Care Medicine to help provide emotional support.  -- F/U with repeat ABG at 2000  Event Summary:  Call Time 1740 Arrival Time 1742 End Time 1810  Kaitlyn Rodriguez R

## 2018-12-25 NOTE — TOC Initial Note (Signed)
Transition of Care [facility]) - Initial/Assessment Note    Patient Details  Name: Kaitlyn Rodriguez MRN: [id number] Date of Birth: Jan 16, 1934  Transition of Care [facility]) CM/SW Contact:    Alberteen Sam, LCSW Phone Number: 12/25/2018, 4:01 PM  Clinical Narrative:                  CSW spoke with patient's son regarding discharge planning and PT recommendations. CSW noted that patient has no insurance on file, [name] states this is not correct that she has [employer]. Insurance information listed below, advised Tommy to call the hospital and get insurance information added to the chart to prevent from being billed.   Tommy reports being agreeable to PT rec of SNF IF patient is unable to return to baseline. He reports he wishes her to gain strength and continue to work with PT with the goal of discharging home wit home health.   Konrad Dolores states they have not used a home health agency in the past and have no equipment or DME at home, including walkers or wheel chairs. Will need to be set up if this is the plan. If patient continues to need SNF, Tommy agreeable to [facility] as this is close to their home. Gave CSW permission to fax referral to [facility].   Will continue to follow for discharge planning.   [place] Information Name on Card: Addysyn Fern. [place]: [id number] Member #: [id number] Group #: [id number] Payer ID #: [id number]  Expected Discharge Plan: Skilled Nursing Facility Barriers to Discharge: Continued Medical Work up   Patient Goals and CMS Choice   CMS Medicare.gov Compare Post Acute Care list provided to:: Patient Represenative (must comment)(son Tommy) Choice offered to / list presented to : Adult Children  Expected Discharge Plan and Services Expected Discharge Plan: [place] Acute Care Choice: [place] Living arrangements for the past 2 months: Single Family Home                                      Prior Living  Arrangements/Services Living arrangements for the past 2 months: Single Family Home Lives with:: Adult Children Patient language and need for interpreter reviewed:: Yes Do you feel safe going back to the place where you live?: Yes      Need for Family Participation in Patient Care: Yes (Comment) Care giver support system in place?: Yes (comment)   Criminal Activity/Legal Involvement Pertinent to Current Situation/Hospitalization: No - Comment as needed  Activities of Daily Living Home Assistive Devices/Equipment: None ADL Screening (condition at time of admission) Patient's cognitive ability adequate to safely complete daily activities?: Yes Is the patient deaf or have difficulty hearing?: No Does the patient have difficulty seeing, even when wearing glasses/contacts?: No Does the patient have difficulty concentrating, remembering, or making decisions?: No Patient able to express need for assistance with ADLs?: No Does the patient have difficulty dressing or bathing?: No Independently performs ADLs?: Yes (appropriate for developmental age) Does the patient have difficulty walking or climbing stairs?: No Weakness of Legs: None Weakness of Arms/Hands: None  Permission Sought/Granted Permission sought to share information with : Case Manager, Customer service manager, Family Supports Permission granted to share information with : Yes, Verbal Permission Granted  Share Information with NAME: Konrad Dolores  Permission granted to share info w AGENCY: SNFs  Permission granted to share info  w Relationship: son  Permission granted to share info w Contact Information: [phone]  Emotional Assessment Appearance:: Appears stated age Attitude/Demeanor/Rapport: Unable to Assess Affect (typically observed): Unable to Assess Orientation: : Oriented to Self, Oriented to Place, Oriented to  Time Alcohol / Substance Use: Not Applicable Psych Involvement: No (comment)  Admission diagnosis:  Code  Stemi Patient Active Problem List   Diagnosis Date Noted  . S/P aortic aneurysm repair 12/21/2018  . Dissecting aneurysm of thoracic aorta, [place] type A (HCC) 12/21/2018  . Hypothyroidism 12/21/2018   PCP:  Patient, No Pcp Per Pharmacy:   [facility] [address] [facility] [place] - [address] [facility] [place] [address] Phone: [phone] Fax: [phone]     Social Determinants of Health (SDOH) Interventions    Readmission Risk Interventions No flowsheet data found.

## 2018-12-25 NOTE — Progress Notes (Signed)
Physical Therapy Treatment Patient Details Name: Kaitlyn Rodriguez MRN: [id number] DOB: Aug 31, 1933 Today's Date: 12/25/2018    History of Present Illness Patient is an 85 y/o female admitted with 1 week of subtle symptoms of malaise and fatigue, shoulder pain which worsened on 12/20/2018.   Work-up suggested myocardial infarction and assessment showed large pericardial effusion.  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    PT Comments    Pt limited by fatigue during session, and requiring increased physical assistance during session for all functional mobility. Pt needing maxA for bed mobility and transfers, unable to attempt gait training due to LE weakness and falls risk. Pt will continue to benefit from acute PT services to reduce falls risk and caregiver burden. PT updating recommendations to SNF.   Follow Up Recommendations  SNF;Supervision/Assistance - 24 hour     Equipment Recommendations  Wheelchair (measurements PT);Hospital bed    Recommendations for Other Services       Precautions / Restrictions Precautions Precautions: Fall;Sternal Restrictions Weight Bearing Restrictions: No Other Position/Activity Restrictions: sternal precautions     Mobility  Bed Mobility Overal bed mobility: Needs Assistance Bed Mobility: Supine to Sit     Supine to sit: Max assist;HOB elevated        Transfers Overall transfer level: Needs assistance Equipment used: None Transfers: Sit to/from [employer] Transfers Sit to Stand: Max assist Stand pivot transfers: Max assist       General transfer comment: PT providing R knee block and physical support utilizing gait belt  Ambulation/Gait                 Stairs             Wheelchair Mobility    Modified Rankin (Stroke Patients Only)       Balance Overall balance assessment: Needs assistance Sitting-balance  support: Bilateral upper extremity supported;Feet supported Sitting balance-Leahy Scale: Fair Sitting balance - Comments: min guard   Standing balance support: Bilateral upper extremity supported Standing balance-Leahy Scale: Zero Standing balance comment: maxA of 1 person                            Cognition Arousal/Alertness: Awake/alert Behavior During Therapy: WFL for tasks assessed/performed Overall Cognitive Status: Impaired/Different from baseline Area of Impairment: Memory;Safety/judgement;Awareness;Problem solving                     Memory: Decreased short-term memory   Safety/Judgement: Decreased awareness of deficits   Problem Solving: Slow processing        Exercises      General Comments General comments (skin integrity, edema, etc.): pt intermittently tachy to 110s during session, recovers quickly with seated rest      Pertinent Vitals/Pain Pain Assessment: Faces Faces Pain Scale: Hurts little more Pain Location: chest-incisional; shoulders  Pain Descriptors / Indicators: Discomfort;Grimacing;Guarding Pain Intervention(s): Limited activity within patient's tolerance    Home Living                      Prior Function            PT Goals (current goals can now be found in the care plan section) Acute Rehab PT Goals Patient Stated Goal: to return home Progress towards PT goals: Not progressing toward goals - comment(pt requiring increased assistance for all mobility)    Frequency  Min 2X/week      PT Plan Discharge plan needs to be updated;Frequency needs to be updated    Co-evaluation              AM-PAC PT "6 Clicks" Mobility   Outcome Measure  Help needed turning from your back to your side while in a flat bed without using bedrails?: A Lot Help needed moving from lying on your back to sitting on the side of a flat bed without using bedrails?: A Lot Help needed moving to and from a bed to a chair  (including a wheelchair)?: Total Help needed standing up from a chair using your arms (e.g., wheelchair or bedside chair)?: A Lot Help needed to walk in hospital room?: Total Help needed climbing 3-5 steps with a railing? : Total 6 Click Score: 9    End of Session Equipment Utilized During Treatment: Gait belt;Oxygen Activity Tolerance: Patient tolerated treatment well Patient left: in chair;with call bell/phone within reach Nurse Communication: Mobility status PT Visit Diagnosis: Muscle weakness (generalized) (M62.81)     Time: 3790-2409 PT Time Calculation (min) (ACUTE ONLY): 23 min  Charges:  $Therapeutic Activity: 23-37 mins                     Arlyss Gandy, PT, DPT Acute Rehabilitation Pager: [phone]    Arlyss Gandy 12/25/2018, 1:05 PM

## 2018-12-25 NOTE — Significant Event (Addendum)
Rapid Response Event Note  Overview: Called with concern for worsening status. Pt lethargic, BP-81/62, and concern for respiratory status. Time Called: 1922 Arrival Time: 1924 Event Type: Neurologic, Respiratory, Hypotension  Initial Focused Assessment: Pt laying in bed with eyes closed. Will open eyes and follow commands with all extremities with repeated stimuli. Pt is difficult to arouse and falls to sleep easily. Pt will say "I don't know" when asked questions repeatedly.  Pupils 5 and slugglish, skin warm and dry, lungs clear, diminished in bases. Pt has gag reflex and appears to be protecting airway. Pt is pulling 400+ tidal volumes on bipap on 12/6.  HR-96, BP-114/79, RR-23, SpO2-100% on .40 bipap.   Interventions: 2000 ABG now-7.33/57/118/29.6-this is a little better than her 1630 ABG. CBG-107 Plan of Care (if not transferred): Pt has no focal deficits, will follow commands and speak with repeated stimulation.  CO2 was elevated earlier and pt was placed on bipap. ABG though only slightly better is going in the right direction. Leave on bipap until pt more awake. Call MD to verify this plan of care. Call RRT if further assistance needed.  Event Summary:   at      at     Event End Time: 1940     Dillard Essex

## 2018-12-25 NOTE — Plan of Care (Signed)
  Problem: Education: Goal: Knowledge of General Education information will improve Description: Including pain rating scale, medication(s)/side effects and non-pharmacologic comfort measures Outcome: Progressing   Problem: Health Behavior/Discharge Planning: Goal: Ability to manage health-related needs will improve Outcome: Progressing   Problem: Clinical Measurements: Goal: Ability to maintain clinical measurements within normal limits will improve Outcome: Progressing Goal: Diagnostic test results will improve Outcome: Progressing Goal: Respiratory complications will improve Outcome: Progressing   Problem: Activity: Goal: Risk for activity intolerance will decrease Outcome: Progressing   Problem: Nutrition: Goal: Adequate nutrition will be maintained Outcome: Progressing   Problem: Coping: Goal: Level of anxiety will decrease Outcome: Progressing   Problem: Elimination: Goal: Will not experience complications related to bowel motility Outcome: Progressing Goal: Will not experience complications related to urinary retention Outcome: Progressing   Problem: Pain Managment: Goal: General experience of comfort will improve Outcome: Progressing   Problem: Safety: Goal: Ability to remain free from injury will improve Outcome: Progressing   Problem: Skin Integrity: Goal: Risk for impaired skin integrity will decrease Outcome: Progressing   

## 2018-12-25 NOTE — NC FL2 (Signed)
[place] LEVEL OF CARE SCREENING TOOL     IDENTIFICATION  Patient Name: Kaitlyn Rodriguez Birthdate: Mar 13, 1933 Sex: female Admission Date (Current Location): 12/20/2018  [facility] and [place] Number:  [employer] and Address:  [facility] [place], [place] [address] [place], [place] [address]      Provider Number: [id number]  Attending Physician Name and Address:  Wonda Olds, MD  Relative Name and Phone Number:  Konrad Dolores [phone]    Current Level of Care: Hospital Recommended Level of Care: [place] Prior Approval Number:    Date Approved/Denied:   PASRR Number: [id number]  Discharge Plan: SNF    Current Diagnoses: Patient Active Problem List   Diagnosis Date Noted  . S/P aortic aneurysm repair 12/21/2018  . Dissecting aneurysm of thoracic aorta, Stanford type A ([place]) 12/21/2018  . Hypothyroidism 12/21/2018    Orientation RESPIRATION BLADDER Height & Weight     Self, Time, Place  Normal, Other (Comment)(nasal cannula 4L/min, Bipap FiO2% 40 continuous) Incontinent, External catheter Weight: 228 lb 9.9 oz (103.7 kg) Height:  5\' 5"  (165.1 cm)  BEHAVIORAL SYMPTOMS/MOOD NEUROLOGICAL BOWEL NUTRITION STATUS      Continent Diet(see discharge summary)  AMBULATORY STATUS COMMUNICATION OF NEEDS Skin   Extensive Assist Verbally Surgical wounds(chest closed surgical incision)                       Personal Care Assistance Level of Assistance  Bathing, Feeding, Dressing, Total care Bathing Assistance: Limited assistance Feeding assistance: Independent Dressing Assistance: Limited assistance Total Care Assistance: Maximum assistance   Functional Limitations Info  Hearing, Speech, Sight Sight Info: Adequate Hearing Info: Adequate Speech Info: Adequate    SPECIAL CARE FACTORS FREQUENCY  OT (By licensed OT), PT (By licensed PT)     PT Frequency: min 5x weekly OT Frequency: min 5x weekly             Contractures Contractures Info: Not present    Additional Factors Info  Code Status, Allergies Code Status Info: full Allergies Info: No Known Allergies           Current Medications (12/25/2018):  This is the current hospital active medication list Current Facility-Administered Medications  Medication Dose Route Frequency Provider Last Rate Last Dose  . 0.9 %  sodium chloride infusion (Manually program via Guardrails IV Fluids)   Intravenous Once Wonda Olds, MD      . acetaminophen (TYLENOL) tablet 1,000 mg  1,000 mg Oral Q6H Atkins, Broadus Z, MD   1,000 mg at 12/25/18 1242   Or  . acetaminophen (TYLENOL) 160 MG/5ML solution 1,000 mg  1,000 mg Per Tube Q6H Wonda Olds, MD   1,000 mg at 12/21/18 1128  . amiodarone (PACERONE) tablet 200 mg  200 mg Oral BID Lars Pinks M, PA-C   200 mg at 12/25/18 1018  . apixaban (ELIQUIS) tablet 5 mg  5 mg Oral BID Wonda Olds, MD   5 mg at 12/25/18 1024  . aspirin chewable tablet 81 mg  81 mg Oral Daily Wonda Olds, MD   81 mg at 12/25/18 1018  . bisacodyl (DULCOLAX) EC tablet 10 mg  10 mg Oral Daily Wonda Olds, MD   10 mg at 12/23/18 6295   Or  . bisacodyl (DULCOLAX) suppository 10 mg  10 mg Rectal Daily Atkins, Glenice Bow, MD      . Chlorhexidine Gluconate Cloth 2 % PADS 6 each  6 each Topical Daily Linden Dolin, MD   6 each at 12/25/18 1025  . docusate sodium (COLACE) capsule 200 mg  200 mg Oral Daily Linden Dolin, MD   200 mg at 12/23/18 0906  . [START ON 12/26/2018] levalbuterol (XOPENEX) nebulizer solution 0.63 mg  0.63 mg Nebulization Q8H PRN Doree Fudge M, PA-C      . levothyroxine (SYNTHROID) tablet 150 mcg  150 mcg Oral QAC breakfast Linden Dolin, MD   150 mcg at 12/25/18 0656  . MEDLINE mouth rinse  15 mL Mouth Rinse BID Linden Dolin, MD   15 mL at 12/25/18 1021  . metoprolol tartrate (LOPRESSOR) 25 mg/10 mL oral suspension 25 mg  25 mg Per Tube BID Doree Fudge  M, PA-C       Or  . metoprolol tartrate (LOPRESSOR) tablet 25 mg  25 mg Oral BID Doree Fudge M, PA-C   25 mg at 12/24/18 2231  . metoprolol tartrate (LOPRESSOR) injection 2.5-5 mg  2.5-5 mg Intravenous Q2H PRN Linden Dolin, MD   5 mg at 12/22/18 2334  . ondansetron (ZOFRAN) injection 4 mg  4 mg Intravenous Q6H PRN Linden Dolin, MD   4 mg at 12/23/18 1007  . oxyCODONE (Oxy IR/ROXICODONE) immediate release tablet 5-10 mg  5-10 mg Oral Q3H PRN Atkins, Merri Brunette, MD      . pantoprazole (PROTONIX) EC tablet 40 mg  40 mg Oral Daily Linden Dolin, MD   40 mg at 12/25/18 1018  . potassium chloride SA (KLOR-CON) CR tablet 20 mEq  20 mEq Oral Daily Doree Fudge M, PA-C   20 mEq at 12/25/18 1019  . sodium chloride flush (NS) 0.9 % injection 3 mL  3 mL Intravenous Q12H Linden Dolin, MD   3 mL at 12/25/18 1024     Discharge Medications: Please see discharge summary for a list of discharge medications.  Relevant Imaging Results:  Relevant Lab Results:   Additional Information SSN: [id number]  Gildardo Griffes, LCSW

## 2018-12-25 NOTE — Progress Notes (Signed)
Epicardial pacing wires were removed this am without difficulty. Vital signs stable. Patient and nurse instructed patient to remain in bed for one hour. Will re order CXR for later this afternoon

## 2018-12-25 NOTE — Progress Notes (Addendum)
Progress Note  Patient Name: Kaitlyn GrandchildCarol F Rodriguez Date of Encounter: 12/25/2018  Primary Cardiologist: No primary care provider on file.   Subjective   No acute overnight events that were reported however on chart review, she was noted to be hypotensive and did receive albumin.  Nursing staff reports that she slept for about 5 hours.  She has converted back into sinus rhythm.  She denies chest pain, shortness of breath.  She was unable to work with physical therapy yesterday.  Inpatient Medications    Scheduled Meds: . sodium chloride   Intravenous Once  . acetaminophen  1,000 mg Oral Q6H   Or  . acetaminophen (TYLENOL) oral liquid 160 mg/5 mL  1,000 mg Per Tube Q6H  . aspirin  81 mg Oral Daily  . bisacodyl  10 mg Oral Daily   Or  . bisacodyl  10 mg Rectal Daily  . Chlorhexidine Gluconate Cloth  6 each Topical Daily  . docusate sodium  200 mg Oral Daily  . insulin aspart  0-24 Units Subcutaneous Q4H  . levothyroxine  150 mcg Oral QAC breakfast  . mouth rinse  15 mL Mouth Rinse BID  . metoprolol tartrate  25 mg Per Tube BID   Or  . metoprolol tartrate  25 mg Oral BID  . OLANZapine zydis  2.5 mg Oral QHS  . pantoprazole  40 mg Oral Daily  . potassium chloride  20 mEq Oral Daily  . sodium chloride flush  3 mL Intravenous Q12H   Continuous Infusions: . amiodarone 30 mg/hr (12/25/18 0609)   PRN Meds: metoprolol tartrate, ondansetron (ZOFRAN) IV, oxyCODONE, traMADol   Vital Signs    Vitals:   12/24/18 2336 12/25/18 0406 12/25/18 0450 12/25/18 0500  BP: 99/71 (!) 89/63 99/75   Pulse:  80    Resp: (!) 23  14   Temp: 98.6 F (37 C)  98.6 F (37 C)   TempSrc: Axillary  Axillary   SpO2: 97%  90%   Weight:    103.7 kg  Height:        Intake/Output Summary (Last 24 hours) at 12/25/2018 0643 Last data filed at 12/25/2018 0400 Gross per 24 hour  Intake 2672.36 ml  Output 940 ml  Net 1732.36 ml   Filed Weights   12/23/18 0630 12/24/18 0500 12/25/18 0500  Weight: 103.3  kg 102.5 kg 103.7 kg    Telemetry    No arrhythmia- Personally Reviewed  ECG    PACs, SR- Personally Reviewed  Physical Exam   GEN: No acute distress, alert and conversational Cardiac: RRR, no murmurs, rubs, or gallops.  Respiratory: Clear to auscultation anteriorly, unable to auscultate posteriorly due to patient's weakness GI: Soft, nontender, non-distended  MS:  2-3+ pitting edema at the bilateral lower extremities. Neuro:  Nonfocal  Psych: Normal affect   Labs    Chemistry Recent Labs  Lab 12/22/18 1900 12/23/18 0312 12/24/18 0821  NA 134* 132* 131*  K 3.6 4.3 3.9  CL 100 99 98  CO2 25 26 25   GLUCOSE 129* 106* 181*  BUN 20 16 14   CREATININE 0.71 0.75 0.82  CALCIUM 7.8* 7.7* 7.0*  GFRNONAA >60 >60 >60  GFRAA >60 >60 >60  ANIONGAP 9 7 8      Hematology Recent Labs  Lab 12/23/18 0312 12/24/18 0821 12/25/18 0500  WBC 13.9* 20.4* 16.1*  RBC 3.60* 3.18* 3.83*  HGB 8.4* 7.6* 9.2*  HCT 27.7* 25.6* 31.6*  MCV 76.9* 80.5 82.5  MCH 23.3* 23.9* 24.0*  MCHC 30.3 29.7* 29.1*  RDW 20.9* 21.2* 21.5*  PLT 175 155 207    Cardiac EnzymesNo results for input(s): TROPONINI in the last 168 hours. No results for input(s): TROPIPOC in the last 168 hours.   BNP Recent Labs  Lab 12/20/18 1807  BNP 233.8*     DDimer  Recent Labs  Lab 12/20/18 1807  DDIMER 3.42*     Radiology    Dg Chest 1 View  Result Date: 12/24/2018 CLINICAL DATA:  85 year old female with a history of shortness of breath, recent repair of acute aortic syndrome EXAM: CHEST  1 VIEW COMPARISON:  12/23/2018 FINDINGS: Cardiomediastinal silhouette unchanged with surgical changes of median sternotomy. Low lung volumes with hazy opacities bilaterally. Blunting of the right costophrenic angle and left costophrenic angle. No pneumothorax. Unchanged right IJ sheath. Minimally displaced right rib fracture. Penetration of the lower chest is limited for evaluation of mediastinal drains/epicardial pacing  leads. IMPRESSION: Low lung volumes with hazy opacities bilateral lower lungs, potentially atelectasis, trace pleural fluid, and/or developing edema. Surgical changes of median sternotomy, and surgical staples projecting over the right chest wall. Unchanged right IJ sheath. Electronically Signed   By: Gilmer Mor D.O.   On: 12/24/2018 08:46    Cardiac Studies   12/22/2018-echocardiogram  1. Left ventricular ejection fraction, by visual estimation, is 35 to 40%. The left ventricle has moderately decreased function. Normal left ventricular size. There is no left ventricular hypertrophy.  2. Left ventricular diastolic Doppler parameters are consistent with impaired relaxation pattern of LV diastolic filling.  3. Global right ventricle has normal systolic function.The right ventricular size is normal. No increase in right ventricular wall thickness.  4. Left atrial size was normal.  5. Right atrial size was normal.  6. Mild mitral annular calcification.  7. The mitral valve is grossly normal. Mild to moderate mitral valve regurgitation.  8. The tricuspid valve is normal in structure. Tricuspid valve regurgitation is trivial.  9. The aortic valve is tricuspid Aortic valve regurgitation is mild by color flow Doppler. Mild to moderate aortic valve sclerosis/calcification without any evidence of aortic stenosis. 10. The pulmonic valve was grossly normal. Pulmonic valve regurgitation is not visualized by color flow Doppler. 11. Mildly elevated pulmonary artery systolic pressure.   Patient Profile   85 y.o. 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   Assessment & Plan    #Dissecting aneurysm of thoracic aorta, Stanford type a status post repair POD 5 Post-operatively, she has remained hypotensive but she is now off all pressors. BP  this am in the stable at 108/74 with MAP of 83, pulse 70. -Continue metoprolol, aspirin -HOLD metoprolol today given labile BP with plan to transition to Toprol XL tomorrow   #Atrial fibrillation She was noted to be in atrial fibrillation with RVR yesterday and was started on amiodarone due to soft blood pressures.  She has now converted into sinus rhythm and has remained hemodynamically stable. CHA2DS2-VASc score 4 for age sex and CHF, HAS-BLED score of 3.  Given recent surgery, will probably hold off on anticoagulation -Continue amiodarone  #HFrEF Echo revealed LVEF of 35-40%. If signs of volume overload, will advice gentle diuresis as BP will not allow. -Metoprolol 25 mg BID -If BP allows, can consider ACE inhibitor or ARB at discharge  #Thyroid disease -Continue Synthroid  #Prediabetes Hemoglobin A1c of 6.3%.    #  Leukocytosis-unclear etiology Leukocytosis this a.m. 16<<20<<13.  She has remained afebrile.  Could be due to postop stress or demargination.  If leukocytosis persists or he develops fever, could obtain blood culture, procalcitonin, urinalysis and repeat chest x-ray.      For questions or updates, please contact [place] Please consult [email or web address] for contact info under Cardiology/STEMI.      Signed, Jean Rosenthal, MD  12/25/2018, 6:43 AM

## 2018-12-25 NOTE — Progress Notes (Addendum)
      [address]       [place] [address]             [phone]        5 Days Post-Op Procedure(s) (LRB): REPAIR OF TYPE A AORTIC DISSECTION USING HEMASHIELD PLATINUM 28MM AND 32MM GRAFT (N/A) STERNAL PLATING  Subjective: Patient states she still "feels sluggish and she wants to get going but just can't"  Objective: Vital signs in last 24 hours: Temp:  [98 F (36.7 C)-98.8 F (37.1 C)] 98.6 F (37 C) (10/29 0712) Pulse Rate:  [80-106] 81 (10/29 0712) Cardiac Rhythm: Atrial fibrillation (10/29 0450) Resp:  [14-23] 17 (10/29 0712) BP: (83-112)/(56-75) 108/74 (10/29 0712) SpO2:  [90 %-97 %] 96 % (10/29 0712) Weight:  [103.7 kg] 103.7 kg (10/29 0500)  Pre op weight  86.2 kg Current Weight  12/25/18 103.7 kg    Intake/Output from previous day: 10/28 0701 - 10/29 0700 In: 2672.4 [P.O.:1190; I.V.:728.9; Blood:389.7; IV Piggyback:363.8] Out: 940 [Urine:940]   Physical Exam:  Cardiovascular: IRRR IRRR Pulmonary: Slightly diminished bibasilar breath sounds, expiratory wheezing Abdomen: Soft, non tender, bowel sounds present. Extremities: Mild bilateral lower extremity edema. Wounds: Right anterior axilla and sternal wounds are clean and dry.  No erythema or signs of infection.  Lab Results: CBC: Recent Labs    12/24/18 0821 12/25/18 0500  WBC 20.4* 16.1*  HGB 7.6* 9.2*  HCT 25.6* 31.6*  PLT 155 207   BMET:  Recent Labs    12/23/18 0312 12/24/18 0821  NA 132* 131*  K 4.3 3.9  CL 99 98  CO2 26 25  GLUCOSE 106* 181*  BUN 16 14  CREATININE 0.75 0.82  CALCIUM 7.7* 7.0*    PT/INR:  Lab Results  Component Value Date   INR 2.1 (H) 12/21/2018   INR 1.3 (H) 12/20/2018   ABG:  INR: Will add last result for INR, ABG once components are confirmed Will add last 4 CBG results once components are confirmed  Assessment/Plan:  1. CV - A fib with CVR. On Amiodarone drip and Lopressor 25 mg bid (with parameters). Will transition to oral  Amiodarone. Hopefully, will convert to SR;if not, may need to consider anticoagulation;altough at her advanced age, carries risk. Will discuss with Dr. Orvan Seen if should consider echo 2.  Pulmonary - On 4 liters of oxygen via [place]. Wean to room air as able. CXR this am ordered but not taken yet . Encourage incentive spirometer. 3. Volume Overload - Will give Lasix 40 mg IV today 4.  Acute blood loss anemia - She was hypotensive yesterday and H and H decreased to 7.6 and 25.6. She was given PRBCs. H and H this am 9.2 and 31.6 5. Hypothyroidism-on Levothyroxine 150 mcg daily.  TSH 2.046 6. I will remove EPW later this am 7. Leukocytosis-related to SIRS and WBC decreased to 16,100. 8. CBGs 103/113/105. No history of DM so will stop accu checks and SS PRN.  Donielle M ZimmermanPA-C 12/25/2018,8:00 AM   Pt seen and examined; she is more lethargic today, but this could be complete exhaustion with lack of sleep. May need to check ABG to r/o hypercapnia.  Cythnia Osmun Z. Orvan Seen, [place]

## 2018-12-26 ENCOUNTER — Inpatient Hospital Stay (HOSPITAL_COMMUNITY): Payer: Medicare Other

## 2018-12-26 DIAGNOSIS — I314 Cardiac tamponade: Secondary | ICD-10-CM

## 2018-12-26 DIAGNOSIS — R400 Somnolence: Secondary | ICD-10-CM | POA: Diagnosis not present

## 2018-12-26 DIAGNOSIS — I361 Nonrheumatic tricuspid (valve) insufficiency: Secondary | ICD-10-CM

## 2018-12-26 DIAGNOSIS — Z9889 Other specified postprocedural states: Secondary | ICD-10-CM | POA: Diagnosis not present

## 2018-12-26 DIAGNOSIS — J9602 Acute respiratory failure with hypercapnia: Secondary | ICD-10-CM

## 2018-12-26 DIAGNOSIS — I712 Thoracic aortic aneurysm, without rupture: Secondary | ICD-10-CM

## 2018-12-26 DIAGNOSIS — J9622 Acute and chronic respiratory failure with hypercapnia: Secondary | ICD-10-CM

## 2018-12-26 DIAGNOSIS — J90 Pleural effusion, not elsewhere classified: Secondary | ICD-10-CM

## 2018-12-26 DIAGNOSIS — I480 Paroxysmal atrial fibrillation: Secondary | ICD-10-CM

## 2018-12-26 DIAGNOSIS — R410 Disorientation, unspecified: Secondary | ICD-10-CM

## 2018-12-26 DIAGNOSIS — I7101 Dissection of thoracic aorta: Secondary | ICD-10-CM | POA: Diagnosis not present

## 2018-12-26 DIAGNOSIS — Z8679 Personal history of other diseases of the circulatory system: Secondary | ICD-10-CM

## 2018-12-26 LAB — POCT I-STAT 7, (LYTES, BLD GAS, ICA,H+H)
Acid-Base Excess: 3 mmol/L — ABNORMAL HIGH (ref 0.0–2.0)
Acid-Base Excess: 2 mmol/L (ref 0.0–2.0)
Bicarbonate: 29.9 mmol/L — ABNORMAL HIGH (ref 20.0–28.0)
Bicarbonate: 29.3 mmol/L — ABNORMAL HIGH (ref 20.0–28.0)
Calcium, Ion: 1.21 mmol/L (ref 1.15–1.40)
Calcium, Ion: 1.22 mmol/L (ref 1.15–1.40)
HCT: 28 % — ABNORMAL LOW (ref 36.0–46.0)
HCT: 29 % — ABNORMAL LOW (ref 36.0–46.0)
Hemoglobin: 9.5 g/dL — ABNORMAL LOW (ref 12.0–15.0)
Hemoglobin: 9.9 g/dL — ABNORMAL LOW (ref 12.0–15.0)
O2 Saturation: 98 %
O2 Saturation: 99 %
Patient temperature: 97.1
Potassium: 4.1 mmol/L (ref 3.5–5.1)
Potassium: 4.1 mmol/L (ref 3.5–5.1)
Sodium: 132 mmol/L — ABNORMAL LOW (ref 135–145)
Sodium: 132 mmol/L — ABNORMAL LOW (ref 135–145)
TCO2: 32 mmol/L (ref 22–32)
TCO2: 31 mmol/L (ref 22–32)
pCO2 arterial: 59.2 mmHg — ABNORMAL HIGH (ref 32.0–48.0)
pCO2 arterial: 57.4 mmHg — ABNORMAL HIGH (ref 32.0–48.0)
pH, Arterial: 7.312 — ABNORMAL LOW (ref 7.350–7.450)
pH, Arterial: 7.312 — ABNORMAL LOW (ref 7.350–7.450)
pO2, Arterial: 120 mmHg — ABNORMAL HIGH (ref 83.0–108.0)
pO2, Arterial: 137 mmHg — ABNORMAL HIGH (ref 83.0–108.0)

## 2018-12-26 LAB — BLOOD GAS, ARTERIAL
Acid-Base Excess: 3.7 mmol/L — ABNORMAL HIGH (ref 0.0–2.0)
Bicarbonate: 29.3 mmol/L — ABNORMAL HIGH (ref 20.0–28.0)
FIO2: 40
O2 Saturation: 98 %
Patient temperature: 37
pCO2 arterial: 58.8 mmHg — ABNORMAL HIGH (ref 32.0–48.0)
pH, Arterial: 7.318 — ABNORMAL LOW (ref 7.350–7.450)
pO2, Arterial: 109 mmHg — ABNORMAL HIGH (ref 83.0–108.0)

## 2018-12-26 LAB — BASIC METABOLIC PANEL
Anion gap: 8 (ref 5–15)
BUN: 24 mg/dL — ABNORMAL HIGH (ref 8–23)
CO2: 28 mmol/L (ref 22–32)
Calcium: 8.2 mg/dL — ABNORMAL LOW (ref 8.9–10.3)
Chloride: 96 mmol/L — ABNORMAL LOW (ref 98–111)
Creatinine, Ser: 1.05 mg/dL — ABNORMAL HIGH (ref 0.44–1.00)
GFR calc Af Amer: 57 mL/min — ABNORMAL LOW (ref >60)
GFR calc non Af Amer: 49 mL/min — ABNORMAL LOW (ref >60)
Glucose, Bld: 112 mg/dL — ABNORMAL HIGH (ref 70–99)
Potassium: 4.5 mmol/L (ref 3.5–5.1)
Sodium: 132 mmol/L — ABNORMAL LOW (ref 135–145)

## 2018-12-26 LAB — CBC
HCT: 30.4 % — ABNORMAL LOW (ref 36.0–46.0)
Hemoglobin: 9 g/dL — ABNORMAL LOW (ref 12.0–15.0)
MCH: 24.3 pg — ABNORMAL LOW (ref 26.0–34.0)
MCHC: 29.6 g/dL — ABNORMAL LOW (ref 30.0–36.0)
MCV: 82.2 fL (ref 80.0–100.0)
Platelets: 255 10*3/uL (ref 150–400)
RBC: 3.7 MIL/uL — ABNORMAL LOW (ref 3.87–5.11)
RDW: 22.3 % — ABNORMAL HIGH (ref 11.5–15.5)
WBC: 12.2 10*3/uL — ABNORMAL HIGH (ref 4.0–10.5)
nRBC: 0.2 % (ref 0.0–0.2)

## 2018-12-26 LAB — HEPATIC FUNCTION PANEL
ALT: 50 U/L — ABNORMAL HIGH (ref 0–44)
AST: 19 U/L (ref 15–41)
Albumin: 2.3 g/dL — ABNORMAL LOW (ref 3.5–5.0)
Alkaline Phosphatase: 76 U/L (ref 38–126)
Bilirubin, Direct: 0.1 mg/dL (ref 0.0–0.2)
Indirect Bilirubin: 0.3 mg/dL (ref 0.3–0.9)
Total Bilirubin: 0.4 mg/dL (ref 0.3–1.2)
Total Protein: 5.2 g/dL — ABNORMAL LOW (ref 6.5–8.1)

## 2018-12-26 LAB — ECHOCARDIOGRAM LIMITED
Height: 65 in
Weight: 3686.09 oz

## 2018-12-26 LAB — GLUCOSE, CAPILLARY
Glucose-Capillary: 122 mg/dL — ABNORMAL HIGH (ref 70–99)
Glucose-Capillary: 116 mg/dL — ABNORMAL HIGH (ref 70–99)
Glucose-Capillary: 98 mg/dL (ref 70–99)

## 2018-12-26 LAB — T4, FREE: Free T4: 0.84 ng/dL (ref 0.61–1.12)

## 2018-12-26 LAB — AMMONIA: Ammonia: 16 umol/L (ref 9–35)

## 2018-12-26 LAB — TSH: TSH: 1.772 u[IU]/mL (ref 0.350–4.500)

## 2018-12-26 MED ORDER — FUROSEMIDE 10 MG/ML IJ SOLN
40.0000 mg | Freq: Every day | INTRAMUSCULAR | Status: DC
  Administered 2018-12-26 – 2018-12-27 (×2): 40 mg via INTRAVENOUS
  Filled 2018-12-26 (×2): qty 4

## 2018-12-26 MED ORDER — AMIODARONE IV BOLUS ONLY 150 MG/100ML
150.0000 mg | Freq: Once | INTRAVENOUS | Status: AC
  Administered 2018-12-26: 150 mg via INTRAVENOUS
  Filled 2018-12-26: qty 100

## 2018-12-26 MED ORDER — SODIUM CHLORIDE 0.9 % IV SOLN: INTRAVENOUS | Status: DC | PRN

## 2018-12-26 MED ORDER — ALBUMIN HUMAN 5 % IV SOLN
25.0000 g | Freq: Once | INTRAVENOUS | Status: AC
  Administered 2018-12-27: 25 g via INTRAVENOUS
  Filled 2018-12-26: qty 500

## 2018-12-26 MED ORDER — ACETAZOLAMIDE SODIUM 500 MG IJ SOLR
500.0000 mg | Freq: Four times a day (QID) | INTRAMUSCULAR | Status: AC
  Administered 2018-12-26 (×2): 500 mg via INTRAVENOUS
  Filled 2018-12-26 (×3): qty 500

## 2018-12-26 MED ORDER — NALOXONE HCL 0.4 MG/ML IJ SOLN
0.1000 mg | INTRAMUSCULAR | Status: DC | PRN
  Administered 2018-12-26: 0.1 mg via INTRAVENOUS
  Filled 2018-12-26: qty 1

## 2018-12-26 MED ORDER — AMIODARONE HCL 200 MG PO TABS
400.0000 mg | ORAL_TABLET | Freq: Two times a day (BID) | ORAL | Status: DC
  Administered 2018-12-27 – 2019-01-06 (×22): 400 mg via ORAL
  Filled 2018-12-26 (×24): qty 2

## 2018-12-26 NOTE — Progress Notes (Signed)
EVENING ROUNDS NOTE :     [address]       [place] [address]             [phone]                 6 Days Post-Op Procedure(s) (LRB): REPAIR OF TYPE A AORTIC DISSECTION USING HEMASHIELD PLATINUM 28MM AND 32MM GRAFT (N/A) STERNAL PLATING   Total Length of Stay:  LOS: 5 days  Events:  Transferred from the floor for tachypnea Will monitor resp status    BP (!) 122/55   Pulse 96   Temp 97.8 F (36.6 C) (Oral)   Resp 17   Ht 5\' 5"  (1.651 m)   Wt 104.5 kg   SpO2 100%   BMI 38.34 kg/m      FiO2 (%):  [40 %] 40 %  . sodium chloride      I/O last 3 completed shifts: In: 711.6 [P.O.:240; I.V.:471.6] Out: 700 [Urine:700]   CBC Latest Ref Rng & Units 12/26/2018 12/25/2018 12/24/2018  WBC 4.0 - 10.5 K/uL 12.2(H) 16.1(H) 20.4(H)  Hemoglobin 12.0 - 15.0 g/dL 9.0(L) 9.2(L) 7.6(L)  Hematocrit 36.0 - 46.0 % 30.4(L) 31.6(L) 25.6(L)  Platelets 150 - 400 K/uL 255 207 155    BMP Latest Ref Rng & Units 12/26/2018 12/25/2018 12/24/2018  Glucose 70 - 99 mg/dL 112(H) 116(H) 181(H)  BUN 8 - 23 mg/dL 24(H) 23 14  Creatinine 0.44 - 1.00 mg/dL 1.05(H) 0.95 0.82  Sodium 135 - 145 mmol/L 132(L) 132(L) 131(L)  Potassium 3.5 - 5.1 mmol/L 4.5 4.7 3.9  Chloride 98 - 111 mmol/L 96(L) 96(L) 98  CO2 22 - 32 mmol/L 28 27 25   Calcium 8.9 - 10.3 mg/dL 8.2(L) 8.3(L) 7.0(L)    ABG    Component Value Date/Time   PHART 7.318 (L) 12/26/2018 0920   PCO2ART 58.8 (H) 12/26/2018 0920   PO2ART 109 (H) 12/26/2018 0920   HCO3 29.3 (H) 12/26/2018 0920   TCO2 27 12/21/2018 1304   ACIDBASEDEF 1.0 12/21/2018 1214   O2SAT 98.0 12/26/2018 0920       Melodie Bouillon, MD 12/26/2018 4:59 PM

## 2018-12-26 NOTE — Progress Notes (Signed)
OT Cancellation Note  Patient Details Name: Kaitlyn Rodriguez MRN: [id number] DOB: November 29, 1933   Cancelled Treatment:    Reason Eval/Treat Not Completed: Medical issues which prohibited therapy.  Spoke with pt.s r.n. who reports not a good time/day for therapy right now.  States pt. Also likely transferring from 2c to 2h unit.  Will check as pt. Medically stable for participation in skilled OT.   Janice Coffin, COTA/L 12/26/2018, 10:33 AM

## 2018-12-26 NOTE — Progress Notes (Signed)
TCTS paged due to low BPs-- SBP 80-90's, with MAPs consistently below 70, CVP 14,. BP 110's/50's and MAP 65 at exact time of call. Patient is somnolent, responds to pain. Repeat ABG reported to MD. Orders received to give 500cc albumin for SBPs sustaining below 90 and to hold any further diuresis.

## 2018-12-26 NOTE — Procedures (Signed)
Arterial Catheter Insertion Procedure Note Kaitlyn Rodriguez [id number] 1933/11/27  Procedure: Insertion of Arterial Catheter  Indications: Blood pressure monitoring and Frequent blood sampling  Procedure Details Consent: Risks of procedure as well as the alternatives and risks of each were explained to the (patient/caregiver).  Consent for procedure obtained. Time Out: Verified patient identification, verified procedure, site/side was marked, verified correct patient position, special equipment/implants available, medications/allergies/relevent history reviewed, required imaging and test results available.  Performed  Maximum sterile technique was used including cap, gloves, gown, hand hygiene, mask and sheet. Skin prep: Chlorhexidine; local anesthetic administered 20 gauge catheter was inserted into right radial artery using the Seldinger technique. ULTRASOUND GUIDANCE USED: NO Evaluation Blood flow good; BP tracing good. Complications: No apparent complications.   Claretta Fraise 12/26/2018

## 2018-12-26 NOTE — Consult Note (Addendum)
NAME:  Kaitlyn GrandchildCarol F Rodriguez, MRN:  [id number], DOB:  March 30, 1933, LOS: 5 ADMISSION DATE:  12/20/2018, CONSULTATION DATE:  12/26/18 REFERRING MD:  Vickey SagesAtkins, CVTS, CHIEF COMPLAINT:  AMS/Hypercapnia   Brief History   85 yo F POD6 repair of type A aortic dissection. Hypercapnia with lethargy noted 10/29, placed on BiPAP in PM. Remains on BiPAP 10/30; PCCM consulted   History of present illness   85 yo F POD 6 type A aortic dissection repair. Patient presented 10/24 with CC sudden onset shoulder pain, SOB. At time of admission, known PMH included hypothyroidism . SOB initially thought to be related to sinus congestion for which patient attempted OTC therapies without relief. Upon onset of sudden, severe shoulder pain, patient presented to ED. In ED, patient tachycardic 130 and soft BP 99/69, SpO2 95% on 2LNC. Initial concern for STEMI with ST elevation V4-6, VI, AVL. CXR revealed widened mediastinum. POCUS performed by cardiology which revealed large pericardial effusion, dilated IVC-- concerning for tamponade. CTA revealed Type A aortic dissection.   Placed on BiPAP 10/29 PM for hypercapnia with lethargy. Follow up ABG with some improvement in hypercapnia and overnight waxing/waning improvement in mental status. Reportedly overnight patient was more alert, came off BiPAP to eat and was then placed back on for remainder of night. 10/30 patient remains lethargic and on BiPAP. PCCM consulted for evaluation.   Past Medical History  Type A Aortic dissection, s/p repair  Atrial fibrillation Hypothyroidism  Prediabetes   Significant Hospital Events   10/24 admitted 10/25 OR for repair of type A dissection with CVTS. Post-op to ICU. Extubated in ICU  10/26 off pressors  10/27 oriented to name and place, not time or situation. Concerning for delirium vs sundowning  10/28 hypotensive and receiving albumin 10/29 in afib, did not sleep well. Continues to appear fluid overloaded. ABG with PCo2 66, placed on BiPAP.  Overnight patient more alert and ate dinner. 10/30 somnolent and remains on BiPAP. PCCM consulted for evaluation   Consults:  Cards CVTS PCCM Procedures:  10/25 repair of type A aortic dissection   Significant Diagnostic Tests:  10/24 widened mediastinuj  10/24 CTA> type A aortic dissection  Micro Data:  10/24 SARS CoV2> neg 10/25 MRSA > neg   Antimicrobials:    Interim history/subjective:  Remains on BiPAP this morning CXR and ABG pending  Update: patient now on phone speaking with her cousin, NAD  Objective   Blood pressure 101/79, pulse 97, temperature 98.5 F (36.9 C), temperature source Axillary, resp. rate 18, height 5\' 5"  (1.651 m), weight 104.5 kg, SpO2 100 %.    FiO2 (%):  [40 %] 40 %   Intake/Output Summary (Last 24 hours) at 12/26/2018 0906 Last data filed at 12/25/2018 1200 Gross per 24 hour  Intake 168.54 ml  Output -  Net 168.54 ml   Filed Weights   12/24/18 0500 12/25/18 0500 12/26/18 0500  Weight: 102.5 kg 103.7 kg 104.5 kg    Examination: General: chronically ill appearing older adult F  HENT: NCAT. BiPAP with good seal. Pink mm. Anicteric sclera, trachea midline  Lungs: Diminished bibasilar sounds. Symmetrical chest expansion, no accessory muscle use Cardiovascular: IRIR s1s2. Cap refill < 3 seconds. BLE edema  Abdomen: Soft round ndnt. + bowel sounds Extremities: Symmetrical bulk and tone. No cyanosis, no clubbing  Neuro: drowsy, awakens to voice, following commands, oriented to person. CAM ICU +  GU: defer Skin: pale, clean, dry, warm. Surgical incisions clean, dry well approximated without bleeding or drainage  Resolved Hospital Problem list     Assessment & Plan:  Dissecting type A thoracic aortic aneurysm, s/p repair P -per CVTS  Hypercapnic respiratory failure requiring BiPAP -suspect likely chronic hypercapnia R pleural effusion  P -STAT CXR, follow up -STAT ABG, follow up -Continue BiPAP -SpO2 goal 92 -NPO -Move to  ICU for further monitoring -IS -Will re-order diamox (1 day course ordered by primary team 10/29) -BDs  Delirium -CAM ICU + , progressive s/sx of sundowning over course of hospitalization  -mild hyponatremia; do not anticipate this is driving factor -hypercapnia as above -CNS depressing analgesia (not overwhelming pain need, however could be a contributing factor) -TSH WNL so not acutely contributing -possible hepatic component in setting of heart failure P -Delirium precautions -minimize opioids as able -NPO  -check LFTS, ammonia  HFrEF Atrial Fibrillation P -Cardiology following appreciate recs : metop and amiodarone -Diuresis per CVTS  Hypothyroidism P -Synthroid   Best practice:  Diet: NPO Pain/Anxiety/Delirium protocol (if indicated): APAP, Oxy  VAP protocol (if indicated): na DVT prophylaxis: eliquis GI prophylaxis: protonix Glucose control: moniotor Mobility: PT Code Status: Full Family Communication: Per Primary Disposition: Transfer to ICU   Labs   CBC: Recent Labs  Lab 12/22/18 0733 12/22/18 1900 12/23/18 0312 12/24/18 0821 12/25/18 0500 12/26/18 0244  WBC 16.5* 13.9* 13.9* 20.4* 16.1* 12.2*  NEUTROABS 13.8*  --  10.9* 19.0*  --   --   HGB 8.8* 8.4* 8.4* 7.6* 9.2* 9.0*  HCT 28.0* 27.2* 27.7* 25.6* 31.6* 30.4*  MCV 76.5* 77.1* 76.9* 80.5 82.5 82.2  PLT 164 163 175 155 207 195    Basic Metabolic Panel: Recent Labs  Lab 12/21/18 0637  12/21/18 1158  12/22/18 1900 12/23/18 0312 12/24/18 0821 12/25/18 1640 12/26/18 0244  NA 141   < > 137   < > 134* 132* 131* 132* 132*  K 3.9   < > 4.4   < > 3.6 4.3 3.9 4.7 4.5  CL 110   < > 105   < > 100 99 98 96* 96*  CO2 25  --  23   < > 25 26 25 27 28   GLUCOSE 101*   < > 133*   < > 129* 106* 181* 116* 112*  BUN 15   < > 22   < > 20 16 14 23  24*  CREATININE 0.86   < > 1.07*   < > 0.71 0.75 0.82 0.95 1.05*  CALCIUM 6.2*  --  7.7*   < > 7.8* 7.7* 7.0* 8.3* 8.2*  MG 2.1  --  3.3*  --  2.3  --   --   --    --    < > = values in this interval not displayed.   GFR: Estimated Creatinine Clearance: 48.7 mL/min (A) (by C-G formula based on SCr of 1.05 mg/dL (H)). Recent Labs  Lab 12/23/18 0312 12/24/18 0821 12/25/18 0500 12/26/18 0244  WBC 13.9* 20.4* 16.1* 12.2*    Liver Function Tests: Recent Labs  Lab 12/25/18 1640  AST 17  ALT 61*  ALKPHOS 68  BILITOT 0.7  PROT 5.0*  ALBUMIN 2.3*   No results for input(s): LIPASE, AMYLASE in the last 168 hours. No results for input(s): AMMONIA in the last 168 hours.  ABG    Component Value Date/Time   PHART 7.333 (L) 12/25/2018 1930   PCO2ART 57.0 (H) 12/25/2018 1930   PO2ART 118 (H) 12/25/2018 1930   HCO3 29.6 (H) 12/25/2018 1930   TCO2 27  12/21/2018 1304   ACIDBASEDEF 1.0 12/21/2018 1214   O2SAT 98.6 12/25/2018 1930     Coagulation Profile: Recent Labs  Lab 12/20/18 1807 12/21/18 0637  INR 1.3* 2.1*    Cardiac Enzymes: No results for input(s): CKTOTAL, CKMB, CKMBINDEX, TROPONINI in the last 168 hours.  HbA1C: Hgb A1c MFr Bld  Date/Time Value Ref Range Status  12/22/2018 04:07 AM 6.3 (H) 4.8 - 5.6 % Final    Comment:    (NOTE) Pre diabetes:          5.7%-6.4% Diabetes:              >6.4% Glycemic control for   <7.0% adults with diabetes     CBG: Recent Labs  Lab 12/25/18 1128 12/25/18 1606 12/25/18 1930 12/25/18 2344 12/26/18 0347  GLUCAP 122* 125* 107* 81 122*    Review of Systems:   Unable to obtain from patient due to acute delirium   Past Medical History  She,  has a past medical history of Thyroid disease.   Surgical History    Past Surgical History:  Procedure Laterality Date  . ABDOMINAL HYSTERECTOMY     at age 58  . REPLACEMENT ASCENDING AORTA N/A 12/20/2018   Procedure: REPAIR OF TYPE A AORTIC DISSECTION USING HEMASHIELD PLATINUM AND GRAFT;  Surgeon: Linden Dolin, MD;  Location: [facility] OR;  Service: Open Heart Surgery;  Laterality: N/A;  . STERNAL CLOSURE  12/20/2018    Procedure: STERNAL PLATING;  Surgeon: Linden Dolin, MD;  Location: [facility] OR;  Service: Open Heart Surgery;;     Social History   reports that she has never smoked. She has never used smokeless tobacco. She reports current alcohol use. She reports that she does not use drugs.   Family History   Her family history is not on file.   Allergies No Known Allergies   Home Medications  Prior to Admission medications   Medication Sig Start Date End Date Taking? Authorizing Provider  levothyroxine (SYNTHROID) 150 MCG tablet Take 150 mcg by mouth daily before breakfast.   Yes [provider]     Critical care time: 31 min      Tessie Fass MSN, AGACNP-BC [facility] [phone] If no answer, [phone] 12/26/2018, 9:49 AM

## 2018-12-26 NOTE — Progress Notes (Signed)
Progress Note  Patient Name: Kaitlyn GrandchildCarol F Rodriguez Date of Encounter: 12/26/2018  Primary Cardiologist: No primary care provider on file.   Subjective   Overnight: Rapid response was called as nurse was concerned that patient was sluggish all day.  She was however alert, conversational and stated that "I am tired, I am ready for this to be over and I just want to be comfortable.  "Her vitals were unremarkable.  ABG performed showed pH 7.3, PCO2 57, PO2 118 on BiPAP.  Inpatient Medications    Scheduled Meds: . sodium chloride   Intravenous Once  . amiodarone  200 mg Oral BID  . apixaban  5 mg Oral BID  . aspirin  81 mg Oral Daily  . bisacodyl  10 mg Oral Daily   Or  . bisacodyl  10 mg Rectal Daily  . Chlorhexidine Gluconate Cloth  6 each Topical Daily  . docusate sodium  200 mg Oral Daily  . levalbuterol  0.63 mg Nebulization Q8H  . levothyroxine  150 mcg Oral QAC breakfast  . mouth rinse  15 mL Mouth Rinse BID  . metoprolol tartrate  25 mg Per Tube BID   Or  . metoprolol tartrate  25 mg Oral BID  . pantoprazole  40 mg Oral Daily  . potassium chloride  20 mEq Oral Daily  . sodium chloride flush  3 mL Intravenous Q12H   Continuous Infusions:  PRN Meds: levalbuterol, metoprolol tartrate, ondansetron (ZOFRAN) IV, oxyCODONE   Vital Signs    Vitals:   12/26/18 0349 12/26/18 0419 12/26/18 0500 12/26/18 0603  BP: 95/68 (!) 80/66    Pulse: 89 90    Resp: (!) 9 13    Temp: 98.4 F (36.9 C)     TempSrc: Axillary     SpO2: 99% 99%  98%  Weight:   104.5 kg   Height:        Intake/Output Summary (Last 24 hours) at 12/26/2018 [id number] Last data filed at 12/25/2018 1200 Gross per 24 hour  Intake 168.54 ml  Output -  Net 168.54 ml   Filed Weights   12/24/18 0500 12/25/18 0500 12/26/18 0500  Weight: 102.5 kg 103.7 kg 104.5 kg    Telemetry    Atrial fibrillation- Personally Reviewed  ECG    None today- Personally Reviewed  Physical Exam   GEN: No acute distress, BiPAP  in place, awake, alert and conversational Neck: No JVD Cardiac: RRR, no murmurs, rubs, or gallops.  Respiratory: Clear to auscultation bilaterally. MS:  2+ pitting edema of the lower extremity bilaterally Neuro:  Nonfocal  Psych: Normal affect   Labs    Chemistry Recent Labs  Lab 12/24/18 0821 12/25/18 1640 12/26/18 0244  NA 131* 132* 132*  K 3.9 4.7 4.5  CL 98 96* 96*  CO2 25 27 28   GLUCOSE 181* 116* 112*  BUN 14 23 24*  CREATININE 0.82 0.95 1.05*  CALCIUM 7.0* 8.3* 8.2*  PROT  --  5.0*  --   ALBUMIN  --  2.3*  --   AST  --  17  --   ALT  --  61*  --   ALKPHOS  --  68  --   BILITOT  --  0.7  --   GFRNONAA >60 55* 49*  GFRAA >60 >60 57*  ANIONGAP 8 9 8      Hematology Recent Labs  Lab 12/24/18 0821 12/25/18 0500 12/26/18 0244  WBC 20.4* 16.1* 12.2*  RBC 3.18* 3.83* 3.70*  HGB  7.6* 9.2* 9.0*  HCT 25.6* 31.6* 30.4*  MCV 80.5 82.5 82.2  MCH 23.9* 24.0* 24.3*  MCHC 29.7* 29.1* 29.6*  RDW 21.2* 21.5* 22.3*  PLT 155 207 255    Cardiac EnzymesNo results for input(s): TROPONINI in the last 168 hours. No results for input(s): TROPIPOC in the last 168 hours.   BNP Recent Labs  Lab 12/20/18 1807  BNP 233.8*     DDimer  Recent Labs  Lab 12/20/18 1807  DDIMER 3.42*     Radiology    Dg Chest 1 View  Result Date: 12/24/2018 CLINICAL DATA:  85 year old female with a history of shortness of breath, recent repair of acute aortic syndrome EXAM: CHEST  1 VIEW COMPARISON:  12/23/2018 FINDINGS: Cardiomediastinal silhouette unchanged with surgical changes of median sternotomy. Low lung volumes with hazy opacities bilaterally. Blunting of the right costophrenic angle and left costophrenic angle. No pneumothorax. Unchanged right IJ sheath. Minimally displaced right rib fracture. Penetration of the lower chest is limited for evaluation of mediastinal drains/epicardial pacing leads. IMPRESSION: Low lung volumes with hazy opacities bilateral lower lungs, potentially  atelectasis, trace pleural fluid, and/or developing edema. Surgical changes of median sternotomy, and surgical staples projecting over the right chest wall. Unchanged right IJ sheath. Electronically Signed   By: Gilmer Mor D.O.   On: 12/24/2018 08:46   Dg Chest 2 View  Result Date: 12/25/2018 CLINICAL DATA:  Pleural effusion short of breath. Replacement ascending aorta 12/20/2018 EXAM: CHEST - 2 VIEW COMPARISON:  12/24/2018 FINDINGS: Progression of right pleural effusion progression of right lower lobe consolidation. Small left effusion unchanged. Negative for pneumothorax. Cardiac enlargement with vascular congestion unchanged. IMPRESSION: Progression of right pleural effusion and right lower lobe consolidation. No pneumothorax. Electronically Signed   By: Marlan Palau M.D.   On: 12/25/2018 13:23    Cardiac Studies   12/22/2018-echocardiogram  1. Left ventricular ejection fraction, by visual estimation, is 35 to 40%. The left ventricle has moderately decreased function. Normal left ventricular size. There is no left ventricular hypertrophy. 2. Left ventricular diastolic Doppler parameters are consistent with impaired relaxation pattern of LV diastolic filling. 3. Global right ventricle has normal systolic function.The right ventricular size is normal. No increase in right ventricular wall thickness. 4. Left atrial size was normal. 5. Right atrial size was normal. 6. Mild mitral annular calcification. 7. The mitral valve is grossly normal. Mild to moderate mitral valve regurgitation. 8. The tricuspid valve is normal in structure. Tricuspid valve regurgitation is trivial. 9. The aortic valve is tricuspid Aortic valve regurgitation is mild by color flow Doppler. Mild to moderate aortic valve sclerosis/calcification without any evidence of aortic stenosis. 10. The pulmonic valve was grossly normal. Pulmonic valve regurgitation is not visualized by color flow Doppler. 11. Mildly  elevated pulmonary artery systolic pressure.  Patient Profile     85 y.o.femalewith medical history significant for thyroid disease who presented with left shoulder pain and shortness of breath found to have a ruptured type a aortic aneurysm status post aortic aneurysm repair.  Assessment & Plan    #Dissecting aneurysm of thoracic aorta, [place] type a status post repair POD 6 She is alert and conversational when engaged.  From a cardiac surgery standpoint, her incision is clean, dry.  This difficult to appreciate heart sounds due to BiPAP interference. -Continue aspirin -Management per CVTS  #Atrial fibrillation She converted back into atrial fibrillation yesterday though her EKG in the morning showed normal sinus rhythm with HR at 62.  This morning,  she remains in atrial fibrillation with HR in the 80s-110s.  Given gross volume overload, we think it is reasonable to give IV amiodarone load 150 mg over an hour and increase p.o. amiodarone at 400 mg twice daily in order to prevent infusion of excess fluid. -IV amiodarone load at 150 mL/hour followed by p.o. amiodarone 400 mg twice daily -Continue Eliquis -On metoprolol 25mg  BID with hod parameters -Likely best to repeat limited echocardiogram with new A. fib with RVR  #HFrEF Echo revealed LVEF of 35-40% with about 2-3+ lower extremity pitting edema.  Since admission, she is net +19 L.  In regards to her weight she has had about 5 pound weight gain since October 26.  Checks x-ray shows progression of right pleural effusion with possible consolidation and unchanged vascular congestion. -Continue metoprolol 25 mg twice daily with hold parameters -Likely require aggressive diuresis with at least IV Lasix 40 mg daily -If BP allows, can consider ACE inhibitor or ARB at discharge  #Hypercapnia ABG shows persistent hypercapnia with PCO2 this a.m. at 58 despite being on BiPAP at night.  With her body habitus and obesity, I suspect she has  chronic undiagnosed obstructive sleep apnea and these episodes of waxing and waning are due to nocturnal hypercapnia -Continue nighttime BiPAP  #Thyroid disease -Continue Synthroid  #Prediabetes Hemoglobin A1c of 6.3%.    For questions or updates, please contact [place] Please consult [email or web address] for contact info under Cardiology/STEMI.      Signed, Jean Rosenthal, MD  12/26/2018, 6:27 AM

## 2018-12-26 NOTE — Progress Notes (Signed)
Patient was transported to CT on BIPAP & back to 2H21 without any complications.

## 2018-12-26 NOTE — Progress Notes (Signed)
Oral medications are to be held per cardiology due to Bipap and lethargy. Will continue to monitor.

## 2018-12-26 NOTE — Progress Notes (Signed)
Echocardiogram 2D Echocardiogram has been performed.  Oneal Deputy Letti Towell 12/26/2018, 10:55 AM   Dr. Meda Coffee notified at 10:54am

## 2018-12-26 NOTE — Progress Notes (Addendum)
      [id number]       [place] [id number]             [phone]        6 Days Post-Op Procedure(s) (LRB): REPAIR OF TYPE A AORTIC DISSECTION USING HEMASHIELD PLATINUM 28MM AND 32MM GRAFT (N/A) STERNAL PLATING  Subjective: Events of last 24 hours noted-patient has been lethargic, requiring bi pap of late  Objective: Vital signs in last 24 hours: Temp:  [97.8 F (36.6 C)-98.7 F (37.1 C)] 98.4 F (36.9 C) (10/30 0349) Pulse Rate:  [81-109] 90 (10/30 0419) Cardiac Rhythm: Atrial fibrillation (10/29 2100) Resp:  [7-23] 13 (10/30 0419) BP: (80-156)/(66-87) 80/66 (10/30 0419) SpO2:  [96 %-100 %] 98 % (10/30 0603) FiO2 (%):  [40 %] 40 % (10/30 0603) Weight:  [104.5 kg] 104.5 kg (10/30 0500)  Pre op weight  86.2 kg Current Weight  12/26/18 104.5 kg    Intake/Output from previous day: 10/29 0701 - 10/30 0700 In: 168.5 [I.V.:168.5] Out: -    Physical Exam:  Cardiovascular: IRRR IRRR, distant heart sounds Pulmonary: On bi pap, slightly diminished bibasilar breath sounds Abdomen: Soft, non tender, bowel sounds present. Extremities: Mild bilateral lower extremity edema. Wounds: Right anterior axilla and sternal wounds are clean and dry.  No erythema or signs of infection.  Lab Results: CBC: Recent Labs    12/25/18 0500 12/26/18 0244  WBC 16.1* 12.2*  HGB 9.2* 9.0*  HCT 31.6* 30.4*  PLT 207 255   BMET:  Recent Labs    12/25/18 1640 12/26/18 0244  NA 132* 132*  K 4.7 4.5  CL 96* 96*  CO2 27 28  GLUCOSE 116* 112*  BUN 23 24*  CREATININE 0.95 1.05*  CALCIUM 8.3* 8.2*    PT/INR:  Lab Results  Component Value Date   INR 2.1 (H) 12/21/2018   INR 1.3 (H) 12/20/2018   ABG:  INR: Will add last result for INR, ABG once components are confirmed Will add last 4 CBG results once components are confirmed  Assessment/Plan:  1. CV - A fib with CVR.  On Amiodarone 200 mg bid and Lopressor 25 mg bid (with parameters), and put on Apixaban 5  mg bid yesterday. Will discuss with Dr. Orvan Seen if should consider echo 2.  Pulmonary - Patient has been on bipap since yesterday (PCO2 57 last evening).  Await this am's ABG results. Encourage incentive spirometer. 3. Volume Overload - Given Lasix IV yesterday 4.  Acute blood loss anemia - She had previous transfusion. H and H this am 9 and 30.4 5. Hypothyroidism-on Levothyroxine 150 mcg daily.  TSH 2.046 6. Leukocytosis-likely related to SIRS and WBC decreased to12,200. 7. Central line has been in since surgery-will remove. If poor venous access, will get midline   Donielle M ZimmermanPA-C 12/26/2018,6:42 AM   Pt seen and examined; remains hypercapneic despite bipap overnight. Transfer to ICU.  Ellsie Violette Z. Orvan Seen, [place]

## 2018-12-27 DIAGNOSIS — I712 Thoracic aortic aneurysm, without rupture: Secondary | ICD-10-CM | POA: Diagnosis not present

## 2018-12-27 DIAGNOSIS — R4182 Altered mental status, unspecified: Secondary | ICD-10-CM

## 2018-12-27 DIAGNOSIS — I7101 Dissection of thoracic aorta: Secondary | ICD-10-CM | POA: Diagnosis not present

## 2018-12-27 LAB — POCT I-STAT 7, (LYTES, BLD GAS, ICA,H+H)
Acid-Base Excess: 1 mmol/L (ref 0.0–2.0)
Acid-Base Excess: 1 mmol/L (ref 0.0–2.0)
Acid-Base Excess: 1 mmol/L (ref 0.0–2.0)
Acid-Base Excess: 3 mmol/L — ABNORMAL HIGH (ref 0.0–2.0)
Bicarbonate: 28.7 mmol/L — ABNORMAL HIGH (ref 20.0–28.0)
Bicarbonate: 28.4 mmol/L — ABNORMAL HIGH (ref 20.0–28.0)
Bicarbonate: 28.6 mmol/L — ABNORMAL HIGH (ref 20.0–28.0)
Bicarbonate: 29.9 mmol/L — ABNORMAL HIGH (ref 20.0–28.0)
Calcium, Ion: 1.24 mmol/L (ref 1.15–1.40)
Calcium, Ion: 1.25 mmol/L (ref 1.15–1.40)
Calcium, Ion: 1.24 mmol/L (ref 1.15–1.40)
Calcium, Ion: 1.23 mmol/L (ref 1.15–1.40)
HCT: 27 % — ABNORMAL LOW (ref 36.0–46.0)
HCT: 28 % — ABNORMAL LOW (ref 36.0–46.0)
HCT: 31 % — ABNORMAL LOW (ref 36.0–46.0)
HCT: 27 % — ABNORMAL LOW (ref 36.0–46.0)
Hemoglobin: 9.2 g/dL — ABNORMAL LOW (ref 12.0–15.0)
Hemoglobin: 9.5 g/dL — ABNORMAL LOW (ref 12.0–15.0)
Hemoglobin: 10.5 g/dL — ABNORMAL LOW (ref 12.0–15.0)
Hemoglobin: 9.2 g/dL — ABNORMAL LOW (ref 12.0–15.0)
O2 Saturation: 98 %
O2 Saturation: 91 %
O2 Saturation: 95 %
O2 Saturation: 98 %
Patient temperature: 96.2
Patient temperature: 97.7
Potassium: 4 mmol/L (ref 3.5–5.1)
Potassium: 4.1 mmol/L (ref 3.5–5.1)
Potassium: 4.2 mmol/L (ref 3.5–5.1)
Potassium: 4 mmol/L (ref 3.5–5.1)
Sodium: 132 mmol/L — ABNORMAL LOW (ref 135–145)
Sodium: 133 mmol/L — ABNORMAL LOW (ref 135–145)
Sodium: 134 mmol/L — ABNORMAL LOW (ref 135–145)
Sodium: 134 mmol/L — ABNORMAL LOW (ref 135–145)
TCO2: 31 mmol/L (ref 22–32)
TCO2: 30 mmol/L (ref 22–32)
TCO2: 30 mmol/L (ref 22–32)
TCO2: 32 mmol/L (ref 22–32)
pCO2 arterial: 56 mmHg — ABNORMAL HIGH (ref 32.0–48.0)
pCO2 arterial: 59 mmHg — ABNORMAL HIGH (ref 32.0–48.0)
pCO2 arterial: 60.9 mmHg — ABNORMAL HIGH (ref 32.0–48.0)
pCO2 arterial: 60.4 mmHg — ABNORMAL HIGH (ref 32.0–48.0)
pH, Arterial: 7.311 — ABNORMAL LOW (ref 7.350–7.450)
pH, Arterial: 7.29 — ABNORMAL LOW (ref 7.350–7.450)
pH, Arterial: 7.279 — ABNORMAL LOW (ref 7.350–7.450)
pH, Arterial: 7.301 — ABNORMAL LOW (ref 7.350–7.450)
pO2, Arterial: 116 mmHg — ABNORMAL HIGH (ref 83.0–108.0)
pO2, Arterial: 70 mmHg — ABNORMAL LOW (ref 83.0–108.0)
pO2, Arterial: 87 mmHg (ref 83.0–108.0)
pO2, Arterial: 115 mmHg — ABNORMAL HIGH (ref 83.0–108.0)

## 2018-12-27 LAB — CBC
HCT: 29.3 % — ABNORMAL LOW (ref 36.0–46.0)
Hemoglobin: 8.7 g/dL — ABNORMAL LOW (ref 12.0–15.0)
MCH: 24.6 pg — ABNORMAL LOW (ref 26.0–34.0)
MCHC: 29.7 g/dL — ABNORMAL LOW (ref 30.0–36.0)
MCV: 82.8 fL (ref 80.0–100.0)
Platelets: 297 10*3/uL (ref 150–400)
RBC: 3.54 MIL/uL — ABNORMAL LOW (ref 3.87–5.11)
RDW: 22.3 % — ABNORMAL HIGH (ref 11.5–15.5)
WBC: 8.6 10*3/uL (ref 4.0–10.5)
nRBC: 0.2 % (ref 0.0–0.2)

## 2018-12-27 LAB — BASIC METABOLIC PANEL
Anion gap: 9 (ref 5–15)
BUN: 23 mg/dL (ref 8–23)
CO2: 27 mmol/L (ref 22–32)
Calcium: 8.3 mg/dL — ABNORMAL LOW (ref 8.9–10.3)
Chloride: 98 mmol/L (ref 98–111)
Creatinine, Ser: 0.84 mg/dL (ref 0.44–1.00)
GFR calc Af Amer: >60 mL/min (ref >60)
GFR calc non Af Amer: >60 mL/min (ref >60)
Glucose, Bld: 98 mg/dL (ref 70–99)
Potassium: 4 mmol/L (ref 3.5–5.1)
Sodium: 134 mmol/L — ABNORMAL LOW (ref 135–145)

## 2018-12-27 LAB — GLUCOSE, CAPILLARY
Glucose-Capillary: 104 mg/dL — ABNORMAL HIGH (ref 70–99)
Glucose-Capillary: 120 mg/dL — ABNORMAL HIGH (ref 70–99)
Glucose-Capillary: 92 mg/dL (ref 70–99)

## 2018-12-27 LAB — MAGNESIUM: Magnesium: 2 mg/dL (ref 1.7–2.4)

## 2018-12-27 MED ORDER — QUETIAPINE FUMARATE 25 MG PO TABS
12.5000 mg | ORAL_TABLET | Freq: Once | ORAL | Status: AC
  Administered 2018-12-27: 12.5 mg via ORAL
  Filled 2018-12-27: qty 1

## 2018-12-27 MED ORDER — ALBUMIN HUMAN 25 % IV SOLN
12.5000 g | Freq: Once | INTRAVENOUS | Status: AC
  Administered 2018-12-27: 12.5 g via INTRAVENOUS
  Filled 2018-12-27: qty 50

## 2018-12-27 MED ORDER — FUROSEMIDE 10 MG/ML IJ SOLN
40.0000 mg | Freq: Two times a day (BID) | INTRAMUSCULAR | Status: DC
  Administered 2018-12-27 – 2018-12-28 (×2): 40 mg via INTRAVENOUS
  Filled 2018-12-27 (×2): qty 4

## 2018-12-27 NOTE — Progress Notes (Addendum)
NAME:  Kaitlyn Rodriguez, MRN:  [id number], DOB:  Mar 25, 1933, LOS: 6 ADMISSION DATE:  12/20/2018, CONSULTATION DATE:  12/26/18 REFERRING MD:  Orvan Seen, CVTS, CHIEF COMPLAINT:  AMS/Hypercapnia   Brief History   85 yo F POD6 repair of type A aortic dissection. Hypercapnia with lethargy noted 10/29, placed on BiPAP in PM. Remains on BiPAP 10/30; PCCM consulted   History of present illness   85 yo F POD 6 type A aortic dissection repair. Patient presented 10/24 with CC sudden onset shoulder pain, SOB. At time of admission, known PMH included hypothyroidism . SOB initially thought to be related to sinus congestion for which patient attempted OTC therapies without relief. Upon onset of sudden, severe shoulder pain, patient presented to ED. In ED, patient tachycardic 130 and soft BP 99/69, SpO2 95% on 2LNC. Initial concern for STEMI with ST elevation V4-6, VI, AVL. CXR revealed widened mediastinum. POCUS performed by cardiology which revealed large pericardial effusion, dilated IVC-- concerning for tamponade. CTA revealed Type A aortic dissection.   Placed on BiPAP 10/29 PM for hypercapnia with lethargy. Follow up ABG with some improvement in hypercapnia and overnight waxing/waning improvement in mental status. Reportedly overnight patient was more alert, came off BiPAP to eat and was then placed back on for remainder of night. 10/30 patient remains lethargic and on BiPAP. PCCM consulted for evaluation.   Past Medical History  Type A Aortic dissection, s/p repair  Atrial fibrillation Hypothyroidism  Prediabetes   Significant Hospital Events   10/24 admitted 10/25 OR for repair of type A dissection with CVTS. Post-op to ICU. Extubated in ICU  10/26 off pressors  10/27 oriented to name and place, not time or situation. Concerning for delirium vs sundowning  10/28 hypotensive and receiving albumin 10/29 in afib, did not sleep well. Continues to appear fluid overloaded. ABG with PCo2 66, placed on BiPAP.  Overnight patient more alert and ate dinner. 10/30 somnolent and remains on BiPAP. PCCM consulted for evaluation   Consults:  Cards CVTS PCCM Procedures:  10/25 repair of type A aortic dissection   Significant Diagnostic Tests:  10/24 widened mediastinuj  10/24 CTA> type A aortic dissection  Micro Data:  10/24 SARS CoV2> neg 10/25 MRSA > neg   Antimicrobials:    Interim history/subjective:  Remains on BiPAP this morning>> IPAP of 10, EPAP of 5 Rate of 20 , 40% Per nursing patient is alert, but when BiPap is removed she has decreased LOC quickly She states she is "dry" from the BiPAP Per nursing she wants BiPAP removed Required Albumin overnight for mild hypotension>> normotensive this am States she does not want the BiPAP  Objective   Blood pressure 118/61, pulse (!) 105, temperature (!) 97.2 F (36.2 C), temperature source Axillary, resp. rate (!) 25, height 5\' 5"  (1.651 m), weight 105.9 kg, SpO2 99 %. CVP:  [2 mmHg-14 mmHg] 3 mmHg  FiO2 (%):  [40 %] 40 %   Intake/Output Summary (Last 24 hours) at 12/27/2018 0857 Last data filed at 12/27/2018 0753 Gross per 24 hour  Intake 434.45 ml  Output 1865 ml  Net -1430.55 ml   Filed Weights   12/25/18 0500 12/26/18 0500 12/27/18 0500  Weight: 103.7 kg 104.5 kg 105.9 kg    Examination: General: chronically ill appearing older adult F , on BiPAP, in NAD HENT: NCAT. BiPAP with good seal. Dry Pink mm. Anicteric sclera, trachea midline  Lungs: Bilateral , symmetrical chest excursion, Diminished bibasilar sounds.  no accessory muscle use Cardiovascular: Mariel Sleet  s1s2. Cap refill < 3 seconds. BLE edema, a fib per tele  Abdomen: Soft round ndnt. + bowel sounds, Body mass index is 38.85 kg/m. Extremities: Symmetrical bulk and tone. No cyanosis, no clubbing, no obvious deformities  Neuro: drowsy, awakens to voice, following commands, oriented to person, appropriate GU: Not done Skin: pale, clean, dry, warm. Surgical incisions clean,  dry well approximated without bleeding or drainage   Resolved Hospital Problem list     Assessment & Plan:  Dissecting type A thoracic aortic aneurysm, s/p repair P -per CVTS  Hypercapnic respiratory failure requiring BiPAP -suspect likely chronic hypercapnia>> OHS/OSA R pleural effusion  P -Trend CXR -ABG in am and prn -Continue BiPAP -SpO2 goal 92 -NPO -IS Q 1 while awake - Diamox per primary team - BDs ( Xopenex)  Delirium -[facility] ICU + , progressive s/sx of sundowning over course of hospitalization  -mild hyponatremia; do not anticipate this is driving factor -hypercapnia as above -CNS depressing analgesia (not overwhelming pain need, however could be a contributing factor) -TSH WNL so not acutely contributing -possible hepatic component in setting of heart failure ( ALT 50, ammonia 16) P -Delirium precautions -minimize opioids as able -NPO  - Trend  LFTS, ammonia  HFrEF Atrial Fibrillation P -Cardiology following appreciate recs : metop and amiodarone -Diuresis per CVTS - Mag now and in am  - Replete as needed  Hypothyroidism P -Continue Synthroid  Pt. Is clear mentally this am, and she states she does not want the BiPAP machine. Consider initiation of Goals of care conversations   Best practice:  Diet: NPO Pain/Anxiety/Delirium protocol (if indicated): APAP, Oxy  VAP protocol (if indicated): na DVT prophylaxis: eliquis GI prophylaxis: protonix Glucose control: moniotor Mobility: PT Code Status: Full Family Communication: Per Primary Disposition: Transfer to ICU   Labs   CBC: Recent Labs  Lab 12/22/18 0733  12/23/18 0312 12/24/18 0821 12/25/18 0500 12/26/18 0244 12/26/18 1811 12/26/18 2214 12/27/18 0342 12/27/18 0354  WBC 16.5*   < > 13.9* 20.4* 16.1* 12.2*  --   --  8.6  --   NEUTROABS 13.8*  --  10.9* 19.0*  --   --   --   --   --   --   HGB 8.8*   < > 8.4* 7.6* 9.2* 9.0* 9.5* 9.9* 8.7* 9.2*  HCT 28.0*   < > 27.7* 25.6* 31.6* 30.4*  28.0* 29.0* 29.3* 27.0*  MCV 76.5*   < > 76.9* 80.5 82.5 82.2  --   --  82.8  --   PLT 164   < > 175 155 207 255  --   --  297  --    < > = values in this interval not displayed.    Basic Metabolic Panel: Recent Labs  Lab 12/21/18 0637  12/21/18 1158  12/22/18 1900 12/23/18 0312 12/24/18 0821 12/25/18 1640 12/26/18 0244 12/26/18 1811 12/26/18 2214 12/27/18 0342 12/27/18 0354  NA 141   < > 137   < > 134* 132* 131* 132* 132* 132* 132* 134* 132*  K 3.9   < > 4.4   < > 3.6 4.3 3.9 4.7 4.5 4.1 4.1 4.0 4.0  CL 110   < > 105   < > 100 99 98 96* 96*  --   --  98  --   CO2 25  --  23   < > 25 26 25 27 28   --   --  27  --   GLUCOSE 101*   < >  133*   < > 129* 106* 181* 116* 112*  --   --  98  --   BUN 15   < > 22   < > 24*  --   --  23  --   CREATININE 0.86   < > 1.07*   < > 0.71 0.75 0.82 0.95 1.05*  --   --  0.84  --   CALCIUM 6.2*  --  7.7*   < > 7.8* 7.7* 7.0* 8.3* 8.2*  --   --  8.3*  --   MG 2.1  --  3.3*  --  2.3  --   --   --   --   --   --   --   --    < > = values in this interval not displayed.   GFR: Estimated Creatinine Clearance: 61.4 mL/min (by C-G formula based on SCr of 0.84 mg/dL). Recent Labs  Lab 12/24/18 0821 12/25/18 0500 12/26/18 0244 12/27/18 0342  WBC 20.4* 16.1* 12.2* 8.6    Liver Function Tests: Recent Labs  Lab 12/25/18 1640 12/26/18 1148  AST 17 19  ALT 61* 50*  ALKPHOS 68 76  BILITOT 0.7 0.4  PROT 5.0* 5.2*  ALBUMIN 2.3* 2.3*   No results for input(s): LIPASE, AMYLASE in the last 168 hours. Recent Labs  Lab 12/26/18 1148  AMMONIA 16    ABG    Component Value Date/Time   PHART 7.311 (L) 12/27/2018 0354   PCO2ART 56.0 (H) 12/27/2018 0354   PO2ART 116.0 (H) 12/27/2018 0354   HCO3 28.7 (H) 12/27/2018 0354   TCO2 31 12/27/2018 0354   ACIDBASEDEF 1.0 12/21/2018 1214   O2SAT 98.0 12/27/2018 0354     Coagulation Profile: Recent Labs  Lab 12/20/18 1807 12/21/18 0637  INR 1.3* 2.1*    Cardiac Enzymes: No results  for input(s): CKTOTAL, CKMB, CKMBINDEX, TROPONINI in the last 168 hours.  HbA1C: Hgb A1c MFr Bld  Date/Time Value Ref Range Status  12/22/2018 04:07 AM 6.3 (H) 4.8 - 5.6 % Final    Comment:    (NOTE) Pre diabetes:          5.7%-6.4% Diabetes:              >6.4% Glycemic control for   <7.0% adults with diabetes     CBG: Recent Labs  Lab 12/25/18 2344 12/26/18 0347 12/26/18 1055 12/26/18 1608 12/27/18 0817  GLUCAP 81 122* 116* 98 92    Review of Systems:   Unable to obtain from patient due to acute delirium   Past Medical History  She,  has a past medical history of Thyroid disease.   Surgical History    Past Surgical History:  Procedure Laterality Date   ABDOMINAL HYSTERECTOMY     at age 70   REPLACEMENT ASCENDING AORTA N/A 12/20/2018   Procedure: REPAIR OF TYPE A AORTIC DISSECTION USING HEMASHIELD PLATINUM AND GRAFT;  Surgeon: Linden Dolin, MD;  Location: [facility] OR;  Service: Open Heart Surgery;  Laterality: N/A;   STERNAL CLOSURE  12/20/2018   Procedure: STERNAL PLATING;  Surgeon: Linden Dolin, MD;  Location: [facility] OR;  Service: Open Heart Surgery;;     Social History   reports that she has never smoked. She has never used smokeless tobacco. She reports current alcohol use. She reports that she does not use drugs.   Family History   Her family history is not on file.  Allergies No Known Allergies   Home Medications  Prior to Admission medications   Medication Sig Start Date End Date Taking? Authorizing Provider  levothyroxine (SYNTHROID) 150 MCG tablet Take 150 mcg by mouth daily before breakfast.   Yes [provider]     Critical care time: 20 min      Bevelyn NgoSarah F. Ariza Evans, MSN, AGACNP-BC [facility] Pager # [phone] After 4 pm please call [phone] 12/27/2018, 8:57 AM

## 2018-12-27 NOTE — Progress Notes (Signed)
      [address]       [place] [address]             [phone]                 7 Days Post-Op Procedure(s) (LRB): REPAIR OF TYPE A AORTIC DISSECTION USING HEMASHIELD PLATINUM 28MM AND 32MM GRAFT (N/A) STERNAL PLATING   Events: Off bipap.   "Wants to give up" _______________________________________________________________ Vitals: BP (!) 112/98   Pulse (!) 120   Temp (!) 97.2 F (36.2 C) (Axillary)   Resp (!) 23   Ht 5\' 5"  (1.651 m)   Wt 105.9 kg   SpO2 93%   BMI 38.85 kg/m   - Neuro: alert NAD  - Cardiovascular: afib  Drips: none, amio bolus, on PO.   CVP:  [2 mmHg-14 mmHg] 2 mmHg  - Pulm: coarse.  On HF FiO2 (%):  [40 %] 40 %  ABG    Component Value Date/Time   PHART 7.290 (L) 12/27/2018 0938   PCO2ART 59.0 (H) 12/27/2018 0938   PO2ART 70.0 (L) 12/27/2018 0938   HCO3 28.4 (H) 12/27/2018 0938   TCO2 30 12/27/2018 0938   ACIDBASEDEF 1.0 12/21/2018 1214   O2SAT 91.0 12/27/2018 0938    - Abd: soft/ND - Extremity: edematous  .Intake/Output      10/30 0701 - 10/31 0700 10/31 0701 - 11/01 0700   I.V. (mL/kg)     IV Piggyback 434.5    Total Intake(mL/kg) 434.5 (4.1)    Urine (mL/kg/hr) 1815 (0.7) 350 (0.6)   Total Output 1815 350   Net -1380.6 -350           _______________________________________________________________ Labs: CBC Latest Ref Rng & Units 12/27/2018 12/27/2018 12/27/2018  WBC 4.0 - 10.5 K/uL - - 8.6  Hemoglobin 12.0 - 15.0 g/dL 9.5(L) 9.2(L) 8.7(L)  Hematocrit 36.0 - 46.0 % 28.0(L) 27.0(L) 29.3(L)  Platelets 150 - 400 K/uL - - 297   CMP Latest Ref Rng & Units 12/27/2018 12/27/2018 12/27/2018  Glucose 70 - 99 mg/dL - - 98  BUN 8 - 23 mg/dL - - 23  Creatinine 0.44 - 1.00 mg/dL - - 0.84  Sodium 135 - 145 mmol/L 133(L) 132(L) 134(L)  Potassium 3.5 - 5.1 mmol/L 4.1 4.0 4.0  Chloride 98 - 111 mmol/L - - 98  CO2 22 - 32 mmol/L - - 27  Calcium 8.9 - 10.3 mg/dL - - 8.3(L)  Total Protein 6.5 - 8.1 g/dL - - -  Total  Bilirubin 0.3 - 1.2 mg/dL - - -  Alkaline Phos 38 - 126 U/L - - -  AST 15 - 41 U/L - - -  ALT 0 - 44 U/L - - -    CXR: PV congestion  _______________________________________________________________  Assessment and Plan: POD 7 s/p type A dissection repair.  Transferred to the unit for respiratory issues  Neuro: pain controlled.  Some depression CV: stable Pulm: on [place].  Will diurese today Renal: + 18L.  Goal net neg 500-1L daily GI: will advance diet Heme: stable ID: [email or web address]  Endo: SSI Dispo: continue ICU care  Melodie Bouillon, MD 12/27/2018 12:46 PM

## 2018-12-27 NOTE — Progress Notes (Signed)
Progress Note  Patient Name: Kaitlyn Rodriguez Date of Encounter: 12/27/2018  Primary Cardiologist: No primary care provider on file.   Subjective   S/p ascending aorta dissection s/p repair. Remains critically ill on BIPAP.   Inpatient Medications    Scheduled Meds: . sodium chloride   Intravenous Once  . amiodarone  400 mg Oral BID  . apixaban  5 mg Oral BID  . aspirin  81 mg Oral Daily  . bisacodyl  10 mg Oral Daily   Or  . bisacodyl  10 mg Rectal Daily  . Chlorhexidine Gluconate Cloth  6 each Topical Daily  . docusate sodium  200 mg Oral Daily  . furosemide  40 mg Intravenous Daily  . levalbuterol  0.63 mg Nebulization Q8H  . levothyroxine  150 mcg Oral QAC breakfast  . mouth rinse  15 mL Mouth Rinse BID  . metoprolol tartrate  25 mg Per Tube BID   Or  . metoprolol tartrate  25 mg Oral BID  . pantoprazole  40 mg Oral Daily  . potassium chloride  20 mEq Oral Daily  . sodium chloride flush  3 mL Intravenous Q12H   Continuous Infusions: . sodium chloride     PRN Meds: Place/Maintain arterial line **AND** sodium chloride, levalbuterol, metoprolol tartrate, naLOXone (NARCAN)  injection, ondansetron (ZOFRAN) IV, oxyCODONE   Vital Signs    Vitals:   12/27/18 0817 12/27/18 0818 12/27/18 0900 12/27/18 1000  BP: 129/62 118/61 102/67 125/79  Pulse: (!) 102 (!) 105 90 (!) 102  Resp: (!) 25 (!) 25 (!) 23 (!) 23  Temp:      TempSrc:      SpO2: 100% 99% 100% 100%  Weight:      Height:        Intake/Output Summary (Last 24 hours) at 12/27/2018 1011 Last data filed at 12/27/2018 0753 Gross per 24 hour  Intake 434.45 ml  Output 1765 ml  Net -1330.55 ml   Filed Weights   12/25/18 0500 12/26/18 0500 12/27/18 0500  Weight: 103.7 kg 104.5 kg 105.9 kg    Telemetry    Atrial fib with a RVR - Personally Reviewed  ECG    none - Personally Reviewed  Physical Exam   GEN: No acute distress.   Neck: No JVD Cardiac: IRIRR, no murmurs, rubs, or gallops.   Respiratory: Clear to auscultation bilaterally with decreased breath sounds. GI: Soft, nontender, non-distended  MS: No edema; No deformity. Neuro:  Nonfocal  Psych: Normal affect   Labs    Chemistry Recent Labs  Lab 12/25/18 1640 12/26/18 0244 12/26/18 1148  12/26/18 2214 12/27/18 0342 12/27/18 0354  NA 132* 132*  --    < > 132* 134* 132*  K 4.7 4.5  --    < > 4.1 4.0 4.0  CL 96* 96*  --   --   --  98  --   CO2 27 28  --   --   --  27  --   GLUCOSE 116* 112*  --   --   --  98  --   BUN 23 24*  --   --   --  23  --   CREATININE 0.95 1.05*  --   --   --  0.84  --   CALCIUM 8.3* 8.2*  --   --   --  8.3*  --   PROT 5.0*  --  5.2*  --   --   --   --  ALBUMIN 2.3*  --  2.3*  --   --   --   --   AST 17  --  19  --   --   --   --   ALT 61*  --  50*  --   --   --   --   ALKPHOS 68  --  76  --   --   --   --   BILITOT 0.7  --  0.4  --   --   --   --   GFRNONAA 55* 49*  --   --   --  >60  --   GFRAA >60 57*  --   --   --  >60  --   ANIONGAP 9 8  --   --   --  9  --    < > = values in this interval not displayed.     Hematology Recent Labs  Lab 12/25/18 0500 12/26/18 0244  12/26/18 2214 12/27/18 0342 12/27/18 0354  WBC 16.1* 12.2*  --   --  8.6  --   RBC 3.83* 3.70*  --   --  3.54*  --   HGB 9.2* 9.0*   < > 9.9* 8.7* 9.2*  HCT 31.6* 30.4*   < > 29.0* 29.3* 27.0*  MCV 82.5 82.2  --   --  82.8  --   MCH 24.0* 24.3*  --   --  24.6*  --   MCHC 29.1* 29.6*  --   --  29.7*  --   RDW 21.5* 22.3*  --   --  22.3*  --   PLT 207 255  --   --  297  --    < > = values in this interval not displayed.    Cardiac EnzymesNo results for input(s): TROPONINI in the last 168 hours. No results for input(s): TROPIPOC in the last 168 hours.   BNP Recent Labs  Lab 12/20/18 1807  BNP 233.8*     DDimer  Recent Labs  Lab 12/20/18 1807  DDIMER 3.42*     Radiology    Dg Chest 2 View  Result Date: 12/25/2018 CLINICAL DATA:  Pleural effusion short of breath. Replacement ascending  aorta 12/20/2018 EXAM: CHEST - 2 VIEW COMPARISON:  12/24/2018 FINDINGS: Progression of right pleural effusion progression of right lower lobe consolidation. Small left effusion unchanged. Negative for pneumothorax. Cardiac enlargement with vascular congestion unchanged. IMPRESSION: Progression of right pleural effusion and right lower lobe consolidation. No pneumothorax. Electronically Signed   By: Marlan Palauharles  Clark M.D.   On: 12/25/2018 13:23   Ct Chest Wo Contrast  Result Date: 12/26/2018 CLINICAL DATA:  Followup for thoracic aortic aneurysm. Patient underwent ascending aorta surgery on 12/20/2018. EXAM: CT CHEST WITHOUT CONTRAST TECHNIQUE: Multidetector CT imaging of the chest was performed following the standard protocol without IV contrast. COMPARISON:  12/20/2018 FINDINGS: Cardiovascular: Ascending aorta graft extends from just above the aortic valve to just proximal to the origin of the innominate artery. Graft measures 3.2 cm anterior-posterior. Just distal to the graft, proximal arch is dilated to approximately 4 cm. The arch decreases caliber becoming normal in caliber throughout the descending thoracic aorta. Atherosclerotic calcifications are noted across the arch and descending thoracic aorta, stable. Fluid attenuation surrounds the ascending aorta extending to the posterior margin of the sternum. This fluid has Hounsfield units averaging around 10. The pericardial effusion noted on the prior exam is mostly resolved with only a minimal amount of  pericardial fluid evident. Heart is mildly enlarged. There are three-vessel coronary artery calcifications. Enlarged main pulmonary artery measuring 4.1 cm, stable. Mediastinum/Nodes: Right internal jugular central venous line. Tip projects in the upper superior vena cava. No mediastinal or hilar masses. No enlarged lymph nodes. Trachea is patent. Esophagus is grossly unremarkable. Lungs/Pleura: Small right and minimal left pleural effusions. Dependent lower  lobe opacities are noted consistent with atelectasis. Additional opacity is noted right upper lobe adjacent to the minor fissure also consistent with atelectasis. Bilateral interstitial thickening. No pneumothorax. Upper Abdomen: No acute abnormality. Musculoskeletal: Changes from the recent median sternotomy. No bone lesions. IMPRESSION: 1. Status post repair of an ascending thoracic aortic aneurysm. Graft extends above the aortic valve to just proximal to the origin the innominate artery. Graft measures 3.2 cm anterior-posterior. 2. There is fluid surrounding ascending aorta and throughout much of the anterior mediastinum. The previously seen pericardial effusion is mostly resolved. 3. Small right and minimal left pleural effusions. Also interstitial thickening in the lungs. Mild interstitial pulmonary edema suspected. 4. Lungs also demonstrate dependent atelectasis mostly in the lower lobes. 5. No pneumothorax. Aortic Atherosclerosis (ICD10-I70.0). Electronically Signed   By: Lajean Manes M.D.   On: 12/26/2018 17:36   Dg Chest Port 1 View  Result Date: 12/26/2018 CLINICAL DATA:  Shortness of breath, coronary bypass EXAM: PORTABLE CHEST 1 VIEW COMPARISON:  12/25/2018 FINDINGS: Right IJ vascular sheath tip at the innominate venous confluence level. The vascular sheath is kinked at the neck. Median sternotomy changes noted. Heart is enlarged with similar bibasilar atelectasis/consolidation and bilateral pleural effusions, worse on the right. No pneumothorax. Aorta atherosclerotic. No significant interval change. IMPRESSION: Stable cardiomegaly, bibasilar atelectasis/consolidation and pleural effusions. Little interval change. No pneumothorax Electronically Signed   By: Jerilynn Mages.  Shick M.D.   On: 12/26/2018 09:59    Cardiac Studies   none  Patient Profile     85 y.o. female admitted with aortic dissection, s/p repair  Assessment & Plan    1. Aortic dissection - s/p repair.  2. Respiratory failure -  likely with chronic hypercapnea. She will continue bipap. 3. Atrial fib - her rates are up a bit but not excessively. We will watch her and continue current meds.     For questions or updates, please contact [place] Please consult [email or web address] for contact info under Cardiology/STEMI.   Signed, Cristopher Peru, MD  12/27/2018, 10:11 AM  Patient ID: Feliberto Gottron, female   DOB: 07-25-1933, 85 y.o.   MRN: [id number]

## 2018-12-28 ENCOUNTER — Inpatient Hospital Stay (HOSPITAL_COMMUNITY): Payer: Medicare Other

## 2018-12-28 DIAGNOSIS — Z66 Do not resuscitate: Secondary | ICD-10-CM

## 2018-12-28 DIAGNOSIS — J9690 Respiratory failure, unspecified, unspecified whether with hypoxia or hypercapnia: Secondary | ICD-10-CM

## 2018-12-28 DIAGNOSIS — J90 Pleural effusion, not elsewhere classified: Secondary | ICD-10-CM

## 2018-12-28 DIAGNOSIS — Z7189 Other specified counseling: Secondary | ICD-10-CM

## 2018-12-28 LAB — CBC
HCT: 27.9 % — ABNORMAL LOW (ref 36.0–46.0)
Hemoglobin: 8.3 g/dL — ABNORMAL LOW (ref 12.0–15.0)
MCH: 24.6 pg — ABNORMAL LOW (ref 26.0–34.0)
MCHC: 29.7 g/dL — ABNORMAL LOW (ref 30.0–36.0)
MCV: 82.5 fL (ref 80.0–100.0)
Platelets: 367 10*3/uL (ref 150–400)
RBC: 3.38 MIL/uL — ABNORMAL LOW (ref 3.87–5.11)
RDW: 22.1 % — ABNORMAL HIGH (ref 11.5–15.5)
WBC: 8.2 10*3/uL (ref 4.0–10.5)
nRBC: 0 % (ref 0.0–0.2)

## 2018-12-28 LAB — POCT I-STAT 7, (LYTES, BLD GAS, ICA,H+H)
Acid-Base Excess: 3 mmol/L — ABNORMAL HIGH (ref 0.0–2.0)
Acid-Base Excess: 4 mmol/L — ABNORMAL HIGH (ref 0.0–2.0)
Bicarbonate: 31.1 mmol/L — ABNORMAL HIGH (ref 20.0–28.0)
Bicarbonate: 31.2 mmol/L — ABNORMAL HIGH (ref 20.0–28.0)
Calcium, Ion: 1.26 mmol/L (ref 1.15–1.40)
Calcium, Ion: 1.24 mmol/L (ref 1.15–1.40)
HCT: 26 % — ABNORMAL LOW (ref 36.0–46.0)
HCT: 28 % — ABNORMAL LOW (ref 36.0–46.0)
Hemoglobin: 8.8 g/dL — ABNORMAL LOW (ref 12.0–15.0)
Hemoglobin: 9.5 g/dL — ABNORMAL LOW (ref 12.0–15.0)
O2 Saturation: 97 %
O2 Saturation: 94 %
Patient temperature: 97.9
Patient temperature: 97
Potassium: 4 mmol/L (ref 3.5–5.1)
Potassium: 4 mmol/L (ref 3.5–5.1)
Sodium: 135 mmol/L (ref 135–145)
Sodium: 136 mmol/L (ref 135–145)
TCO2: 33 mmol/L — ABNORMAL HIGH (ref 22–32)
TCO2: 33 mmol/L — ABNORMAL HIGH (ref 22–32)
pCO2 arterial: 65.7 mmHg (ref 32.0–48.0)
pCO2 arterial: 55.5 mmHg — ABNORMAL HIGH (ref 32.0–48.0)
pH, Arterial: 7.282 — ABNORMAL LOW (ref 7.350–7.450)
pH, Arterial: 7.354 (ref 7.350–7.450)
pO2, Arterial: 104 mmHg (ref 83.0–108.0)
pO2, Arterial: 71 mmHg — ABNORMAL LOW (ref 83.0–108.0)

## 2018-12-28 LAB — COMPREHENSIVE METABOLIC PANEL
ALT: 31 U/L (ref 0–44)
AST: 13 U/L — ABNORMAL LOW (ref 15–41)
Albumin: 2.4 g/dL — ABNORMAL LOW (ref 3.5–5.0)
Alkaline Phosphatase: 59 U/L (ref 38–126)
Anion gap: 8 (ref 5–15)
BUN: 25 mg/dL — ABNORMAL HIGH (ref 8–23)
CO2: 30 mmol/L (ref 22–32)
Calcium: 8.3 mg/dL — ABNORMAL LOW (ref 8.9–10.3)
Chloride: 98 mmol/L (ref 98–111)
Creatinine, Ser: 0.85 mg/dL (ref 0.44–1.00)
GFR calc Af Amer: >60 mL/min (ref >60)
GFR calc non Af Amer: >60 mL/min (ref >60)
Glucose, Bld: 102 mg/dL — ABNORMAL HIGH (ref 70–99)
Potassium: 4.1 mmol/L (ref 3.5–5.1)
Sodium: 136 mmol/L (ref 135–145)
Total Bilirubin: 0.6 mg/dL (ref 0.3–1.2)
Total Protein: 5.3 g/dL — ABNORMAL LOW (ref 6.5–8.1)

## 2018-12-28 LAB — GLUCOSE, CAPILLARY
Glucose-Capillary: 89 mg/dL (ref 70–99)
Glucose-Capillary: 89 mg/dL (ref 70–99)
Glucose-Capillary: 114 mg/dL — ABNORMAL HIGH (ref 70–99)
Glucose-Capillary: 100 mg/dL — ABNORMAL HIGH (ref 70–99)
Glucose-Capillary: 92 mg/dL (ref 70–99)

## 2018-12-28 LAB — MAGNESIUM: Magnesium: 2 mg/dL (ref 1.7–2.4)

## 2018-12-28 MED ORDER — APIXABAN 5 MG PO TABS: 5.0000 mg | ORAL_TABLET | Freq: Two times a day (BID) | ORAL | Status: DC

## 2018-12-28 MED ORDER — MIDAZOLAM HCL 2 MG/2ML IJ SOLN
INTRAMUSCULAR | Status: AC
  Filled 2018-12-28: qty 2

## 2018-12-28 MED ORDER — FUROSEMIDE 10 MG/ML IJ SOLN
8.0000 mg/h | INTRAVENOUS | Status: DC
  Administered 2018-12-28 – 2018-12-30 (×2): 8 mg/h via INTRAVENOUS
  Filled 2018-12-28 (×4): qty 25

## 2018-12-28 MED ORDER — LEVALBUTEROL HCL 0.63 MG/3ML IN NEBU
0.6300 mg | INHALATION_SOLUTION | Freq: Three times a day (TID) | RESPIRATORY_TRACT | Status: DC
  Administered 2018-12-28 – 2018-12-31 (×10): 0.63 mg via RESPIRATORY_TRACT
  Filled 2018-12-28 (×11): qty 3

## 2018-12-28 MED ORDER — WHITE PETROLATUM EX OINT
TOPICAL_OINTMENT | CUTANEOUS | Status: AC
  Administered 2018-12-28: 0.2
  Filled 2018-12-28: qty 28.35

## 2018-12-28 MED ORDER — FENTANYL CITRATE (PF) 100 MCG/2ML IJ SOLN
INTRAMUSCULAR | Status: AC
  Filled 2018-12-28: qty 2

## 2018-12-28 NOTE — Progress Notes (Signed)
     [address]       [place] [address]             [phone]        Pt is now formally DNR/DNI.  She has agreed to receive CRRT. Will place line in the am given that she has been on [place]

## 2018-12-28 NOTE — Consult Note (Signed)
Consultation Note Date: 12/28/2018   Patient Name: Kaitlyn Rodriguez  DOB: June 11, 1933  MRN: [id number]  Age / Sex: 85 y.o., female  PCP: Patient, No Pcp Per Referring Physician: Linden Dolin, MD  Reason for Consultation: Establishing goals of care  HPI/Patient Profile: 85 y.o. female  with past medical history of   admitted on 12/20/2018 with  .   Clinical Assessment and Goals of Care: Kaitlyn Rodriguez is a 85 year old lady with past medical history of atrial fibrillation, hypothyroidism.  She was admitted for aortic dissection and is status post repair with today being postop day 8 11-1.  Postop, the patient has had acute hypercarbic respiratory failure.  She has required use of BiPAP.  She has had waxing and waning mental status and borderline blood pressures.  Ongoing discussions have previously been undertaken by primary team, critical care service with the patient and her son with regards to CODE STATUS, renal replacement, overall goals of care.  A palliative consultation has been requested for ongoing conversations of the above nature. Patient is awake alert resting in bed.  She does appear fatigued and chronically ill.  She is currently on nasal cannula.  Son is present at the bedside.  Dr. Everardo All with critical care service and Ms. Kaitlyn Lodge, NP with the critical care service as well as bedside RN all present at the bedside along with the patient's son.  Palliative medicine is specialized medical care for people living with serious illness. It focuses on providing relief from the symptoms and stress of a serious illness. The goal is to improve quality of life for both the patient and the family.  Goals of care: Broad aims of medical therapy in relation to the patient's values and preferences. Our aim is to provide medical care aimed at enabling patients to achieve the goals that matter most to them, given the  circumstances of their particular medical situation and their constraints.   Goals wishes and values important to the patient and family as a unit attempted to be explored.  Ongoing goals of care discussions have already been undertaken.  Ongoing CODE STATUS discussions have already been undertaken.  It appears to me that the patient is clear about not undergoing cardiopulmonary resuscitation, not undergoing intubation and mechanical ventilation.  At the time of death, in my opinion, it will be most medically prudent to provide comfort measures and allow for a natural and peaceful and comfortable passing.  We discussed about management of the patient in the interim.  Particularly, scope of trial of dialysis with the intention being to remove fluid and allow for symptom relief was discussed in detail.  Patient truly is conflicted about whether or not she would want to proceed with this.  For tonight, patient encouraged to use BiPAP and rest.  Ongoing discussions of this nature to continue.  Palliative will follow along.  Thank you for the consult.  NEXT OF KIN Son Kaitlyn Rodriguez is her only child.  He is present at the bedside.  SUMMARY OF RECOMMENDATIONS  Agree with the patient's wishes as well as multiple previous providers recommendations for consideration for DO NOT RESUSCITATE/DO NOT INTUBATE.  I will change her CODE STATUS in the computer to DNR to reflect this.  Discussed with both patient and son Kaitlyn Rodriguez. Palliative medicine team will continue to follow along and help facilitate further overall goals of care discussions including the decision for trial of dialysis for volume removal.  Thank you for the consult.  Code Status/Advance Care Planning:  DNR    Symptom Management:   Continue current mode of care  Palliative Prophylaxis:   Delirium Protocol   Psycho-social/Spiritual:   Desire for further Chaplaincy support:yes  Additional Recommendations: Caregiving   Support/Resources  Prognosis:   Unable to determine  Discharge Planning: To Be Determined      Primary Diagnoses: Present on Admission: . 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 Hypothyroidism   I have reviewed the medical record, interviewed the patient and family, and examined the patient. The following aspects are pertinent.  Past Medical History:  Diagnosis Date  . Thyroid disease    Social History   Socioeconomic History  . Marital status: Unknown    Spouse name: Not on file  . Number of children: Not on file  . Years of education: Not on file  . Highest education level: Not on file  Occupational History  . Not on file  Social Needs  . Financial resource strain: Not on file  . Food insecurity    Worry: Not on file    Inability: Not on file  . Transportation needs    Medical: Not on file    Non-medical: Not on file  Tobacco Use  . Smoking status: Never Smoker  . Smokeless tobacco: Never Used  Substance and Sexual Activity  . Alcohol use: Yes  . Drug use: Never  . Sexual activity: Not on file  Lifestyle  . Physical activity    Days per week: Not on file    Minutes per session: Not on file  . Stress: Not on file  Relationships  . Social Musicianconnections    Talks on phone: Not on file    Gets together: Not on file    Attends religious service: Not on file    Active member of club or organization: Not on file    Attends meetings of clubs or organizations: Not on file    Relationship status: Not on file  Other Topics Concern  . Not on file  Social History Narrative  . Not on file   History reviewed. No pertinent family history. Scheduled Meds: . sodium chloride   Intravenous Once  . amiodarone  400 mg Oral BID  . [START ON 12/30/2018] apixaban  5 mg Oral BID  . aspirin  81 mg Oral Daily  . bisacodyl  10 mg Oral Daily   Or  . bisacodyl  10 mg Rectal Daily  . Chlorhexidine Gluconate Cloth  6 each Topical Daily  . docusate sodium   200 mg Oral Daily  . furosemide  40 mg Intravenous BID  . levalbuterol  0.63 mg Nebulization TID  . levothyroxine  150 mcg Oral QAC breakfast  . mouth rinse  15 mL Mouth Rinse BID  . metoprolol tartrate  25 mg Per Tube BID   Or  . metoprolol tartrate  25 mg Oral BID  . pantoprazole  40 mg Oral Daily  . potassium chloride  20 mEq Oral Daily  . sodium chloride flush  3 mL Intravenous Q12H   Continuous Infusions: . sodium chloride     PRN Meds:.Place/Maintain arterial line **AND** sodium chloride, levalbuterol, metoprolol tartrate, naLOXone (NARCAN)  injection, ondansetron (ZOFRAN) IV, oxyCODONE Medications Prior to Admission:  Prior to Admission medications   Medication Sig Start Date End Date Taking? Authorizing Provider  levothyroxine (SYNTHROID) 150 MCG tablet Take 150 mcg by mouth daily before breakfast.   Yes [provider]   No Known Allergies Review of Systems +fatigue  Physical Exam Chronically ill-appearing weak tired lady resting in bed Awake alert oriented answers all questions appropriately Has dry lips Diminished breath sounds bilaterally Has edema both upper and lower extremities S1-S2 Appears with anxious affect.  Responds appropriately to all questions asked.  Vital Signs: BP 113/78   Pulse (!) 104   Temp 98.3 F (36.8 C) (Oral)   Resp (!) 25   Ht 5\' 5"  (1.651 m)   Wt 105.9 kg   SpO2 97%   BMI 38.85 kg/m  Pain Scale: CPOT   Pain Score: 0-No pain   SpO2: SpO2: 97 % O2 Device:SpO2: 97 % O2 Flow Rate: .O2 Flow Rate (L/min): 5 L/min  IO: Intake/output summary:   Intake/Output Summary (Last 24 hours) at 12/28/2018 1717 Last data filed at 12/28/2018 1600 Gross per 24 hour  Intake 162.27 ml  Output 1325 ml  Net -1162.73 ml    LBM: Last BM Date: 12/23/18 Baseline Weight: Weight: 86.2 kg Most recent weight: Weight: 105.9 kg     Palliative Assessment/Data:   PPS 30%  Time In:  1600 Time Out:  1710 Time Total:  70 min Greater than  50%  of this time was spent counseling and coordinating care related to the above assessment and plan.  Signed by: Loistine Chance, MD   Please contact Palliative Medicine Team phone at [phone] for questions and concerns.  For individual provider: See Shea Evans

## 2018-12-28 NOTE — Progress Notes (Signed)
Dr. Loanne Drilling is notified about patient's low BP 85/67 (74), HR 92. No new orders received. Continue to monitor the patient.

## 2018-12-28 NOTE — Progress Notes (Signed)
Elink notified of patients drop in BP with MAP in 50s. Deterding, MD, ordered 25% albumin and ABG. Lightfoot, MD also notified of change in status. Pt. Alert to touch and following commands will alert MD of ABG results

## 2018-12-28 NOTE — Consult Note (Signed)
Renal Service Consult Note [facility]  Kaitlyn Rodriguez 12/28/2018 Kaitlyn Rodriguez Requesting Physician:  Dr Vickey Sages, B.   Reason for Consult: Volume overload, oliguria HPI: The patient is a 85 y.o. year-old w/ hx hypothyroidism presented on 10/24 w/ sudden onset SOB. ED w/u CT chest showed findings of ascending aortic intramural hematoma. CT surgery took pt to OR emergently from the ED for repair of ascending aortic dissection. Had resp decompensation w/ hypercarbia on day 7 post-op.  Pt also 17-20L up w/ diffuse edema. Has been getting IV lasix 40mg  2-3 x per day the last few days, w/ decent UOP but wt's are not coming down.  Asked to see for CRRT.   Pt on bipap, not in distress. Difficult to communicate with.  BP's have been 90's - 110's mostly.  Had recent drop into 70's after IV lasix per the RN.  IV albumin given.    I/O 10/28 > 2.6 L in and 940 cc UOP        10/29 > 168 in and 400 cc UOP         10/30 > 434 in and 1815 cc UOP         10/31 > 282 in and 1475 cc UOP   ROS  denies CP  no joint pain   no HA  no blurry vision  no rash  no diarrhea  no nausea/ vomiting   Past Medical History  Past Medical History:  Diagnosis Date  . Thyroid disease    Past Surgical History  Past Surgical History:  Procedure Laterality Date  . ABDOMINAL HYSTERECTOMY     at age 59  . REPLACEMENT ASCENDING AORTA N/A 12/20/2018   Procedure: REPAIR OF TYPE A AORTIC DISSECTION USING HEMASHIELD PLATINUM 12/22/2018 AND GRAFT;  Surgeon: , MD;  Location: [facility] OR;  Service: Open Heart Surgery;  Laterality: N/A;  . STERNAL CLOSURE  12/20/2018   Procedure: STERNAL PLATING;  Surgeon: 12/22/2018, MD;  Location: [facility] OR;  Service: Open Heart Surgery;;   Family History History reviewed. No pertinent family history. Social History  reports that she has never smoked. She has never used smokeless tobacco. She reports current alcohol use. She reports that she does not use  drugs. Allergies No Known Allergies Home medications Prior to Admission medications   Medication Sig Start Date End Date Taking? Authorizing Provider  levothyroxine (SYNTHROID) 150 MCG tablet Take 150 mcg by mouth daily before breakfast.   Yes [provider]   Liver Function Tests Recent Labs  Lab 12/25/18 1640 12/26/18 1148 12/28/18 0234  AST 17 19 13*  ALT 61* 50* 31  ALKPHOS 68 76 59  BILITOT 0.7 0.4 0.6  PROT 5.0* 5.2* 5.3*  ALBUMIN 2.3* 2.3* 2.4*   No results for input(s): LIPASE, AMYLASE in the last 168 hours. CBC Recent Labs  Lab 12/22/18 0733  12/23/18 0312 12/24/18 0821  12/26/18 0244  12/27/18 0342  12/27/18 2209 12/28/18 0234 12/28/18 0239  WBC 16.5*   < > 13.9* 20.4*   < > 12.2*  --  8.6  --   --  8.2  --   NEUTROABS 13.8*  --  10.9* 19.0*  --   --   --   --   --   --   --   --   HGB 8.8*   < > 8.4* 7.6*   < > 9.0*   < > 8.7*   < > 9.2* 8.3*  8.8*  HCT 28.0*   < > 27.7* 25.6*   < > 30.4*   < > 29.3*   < > 27.0* 27.9* 26.0*  MCV 76.5*   < > 76.9* 80.5   < > 82.2  --  82.8  --   --  82.5  --   PLT 164   < > 175 155   < > 255  --  297  --   --  367  --    < > = values in this interval not displayed.   Basic Metabolic Panel Recent Labs  Lab 12/22/18 1900 12/23/18 1324 12/24/18 4010 12/25/18 1640 12/26/18 0244  12/27/18 0342 12/27/18 0354 12/27/18 0938 12/27/18 1709 12/27/18 2209 12/28/18 0234 12/28/18 0239  NA 134* 132* 131* 132* 132*   < > 134* 132* 133* 134* 134* 136 135  K 3.6 4.3 3.9 4.7 4.5   < > 4.0 4.0 4.1 4.2 4.0 4.1 4.0  CL 100 99 98 96* 96*  --  98  --   --   --   --  98  --   CO2 25 26 25 27 28   --  27  --   --   --   --  30  --   GLUCOSE 129* 106* 181* 116* 112*  --  98  --   --   --   --  102*  --   BUN 20 16 14 23  24*  --  23  --   --   --   --  25*  --   CREATININE 0.71 0.75 0.82 0.95 1.05*  --  0.84  --   --   --   --  0.85  --   CALCIUM 7.8* 7.7* 7.0* 8.3* 8.2*  --  8.3*  --   --   --   --  8.3*  --    < > = values in  this interval not displayed.   Iron/TIBC/Ferritin/ %Sat No results found for: IRON, TIBC, FERRITIN, IRONPCTSAT  Vitals:   12/28/18 1800 12/28/18 1900 12/28/18 1956 12/28/18 2042  BP: 123/71 123/71  122/81  Pulse: 100 94 90 (!) 102  Resp: (!) 28 (!) 22 20 (!) 22  Temp:      TempSrc:      SpO2: (!) 88% 94% 96% 97%  Weight:      Height:        Exam Gen elderly pleasant WF, on bipap, follows simple commands No rash, cyanosis or gangrene Sclera anicteric, throat not seen No jvd or bruits Chest some R base rales, some rhonchi, L clear RRR no MRG Abd soft ntnd no mass or ascites +bs GU foley in place draining amber urine MS no joint effusions or deformity Ext diffuse bilat LE > UE edema Neuro is alert, Ox 3 , nf, gen weakness/ debility    Home meds:  - levothyroxine 150 ug qam   CXR 11/1 > IMPRESSION: Stable chest x-ray. Stable bibasilar opacities, compatible with the combination of atelectasis and small pleural effusions demonstrated on chest CT of 10/30/202020    CT chest 12/26/18 >  IMPRESSION: 1. Status post repair of an ascending thoracic aortic aneurysm. Graft extends above the aortic valve to just proximal to the origin the innominate artery. Graft measures 3.2 cm anterior-posterior. 2. There is fluid surrounding ascending aorta and throughout much of the anterior mediastinum. The previously seen pericardial effusion is mostly resolved. 3. Small right and minimal left pleural effusions.  Also interstitial thickening in the lungs. Mild interstitial pulmonary edema suspected. 4. Lungs also demonstrate dependent atelectasis mostly in the lower lobes. 5. No pneumothorax.   Assessment/ Plan: 1. Volume overload: 18- 20kg up, making urine, creat 0.8- 1.1. Pt has rec'd IV lasix in 40 mg doses.for the past several days but wt's have not come down.  Called for possible CRRT but have discussed w/ surgery tonight and not sure if CRRT will be desired or not.  We will follow up in  the morning.  2. Hypercarbic respiratory failure - on Bipap 3. SP ascending aortic dissection repair - on 12/21/18 4. DNR      Vinson Moselleob Alastair Hennes  MD 12/28/2018, 8:58 PM

## 2018-12-28 NOTE — Progress Notes (Signed)
     [address]       [place] [address]             [phone]       I had a long discussion with the patient's son as well as Mrs. Netzley in regards to plans of care.  She was quite sleepy when she spoke to me but she did reiterate her desire not to be intubated and not to have any aggressive life-saving measures.  Palliative care will be consulted for further discussion, but given how lucid she was when she spoke with critical care medicine will change her CODE STATUS to DNR/DNI.

## 2018-12-28 NOTE — Progress Notes (Signed)
Progress Note  Patient Name: Kaitlyn Rodriguez Date of Encounter: 12/28/2018  Primary Cardiologist: No primary care provider on file.   Subjective   "left me go home."  Inpatient Medications    Scheduled Meds:  sodium chloride   Intravenous Once   amiodarone  400 mg Oral BID   apixaban  5 mg Oral BID   aspirin  81 mg Oral Daily   bisacodyl  10 mg Oral Daily   Or   bisacodyl  10 mg Rectal Daily   Chlorhexidine Gluconate Cloth  6 each Topical Daily   docusate sodium  200 mg Oral Daily   furosemide  40 mg Intravenous BID   levalbuterol  0.63 mg Nebulization TID   levothyroxine  150 mcg Oral QAC breakfast   mouth rinse  15 mL Mouth Rinse BID   metoprolol tartrate  25 mg Per Tube BID   Or   metoprolol tartrate  25 mg Oral BID   pantoprazole  40 mg Oral Daily   potassium chloride  20 mEq Oral Daily   sodium chloride flush  3 mL Intravenous Q12H   Continuous Infusions:  sodium chloride     PRN Meds: Place/Maintain arterial line **AND** sodium chloride, levalbuterol, metoprolol tartrate, naLOXone (NARCAN)  injection, ondansetron (ZOFRAN) IV, oxyCODONE   Vital Signs    Vitals:   12/28/18 0645 12/28/18 0700 12/28/18 0803 12/28/18 0804  BP: 94/69 108/68  113/75  Pulse: 81 77  64  Resp: 20 (!) 32  (!) 23  Temp:    97.6 F (36.4 C)  TempSrc:    Oral  SpO2: 100% 100% 100% 100%  Weight:      Height:        Intake/Output Summary (Last 24 hours) at 12/28/2018 0942 Last data filed at 12/28/2018 0500 Gross per 24 hour  Intake 282.27 ml  Output 1325 ml  Net -1042.73 ml   Filed Weights   12/26/18 0500 12/27/18 0500 12/28/18 0500  Weight: 104.5 kg 105.9 kg 105.9 kg    Telemetry    Atrial fib - Personally Reviewed  ECG    none - Personally Reviewed  Physical Exam   GEN: anxious but No acute distress.   Neck: No JVD Cardiac: RRR, no murmurs, rubs, or gallops.  Respiratory: Clear to auscultation bilaterally. GI: Soft, nontender, non-distended   MS: No edema; No deformity. Neuro:  Nonfocal  Psych: Normal affect   Labs    Chemistry Recent Labs  Lab 12/25/18 1640 12/26/18 0244 12/26/18 1148  12/27/18 0342  12/27/18 2209 12/28/18 0234 12/28/18 0239  NA 132* 132*  --    < > 134*   < > 134* 136 135  K 4.7 4.5  --    < > 4.0   < > 4.0 4.1 4.0  CL 96* 96*  --   --  98  --   --  98  --   CO2 27 28  --   --  27  --   --  30  --   GLUCOSE 116* 112*  --   --  98  --   --  102*  --   BUN 23 24*  --   --  23  --   --  25*  --   CREATININE 0.95 1.05*  --   --  0.84  --   --  0.85  --   CALCIUM 8.3* 8.2*  --   --  8.3*  --   --  8.3*  --   PROT 5.0*  --  5.2*  --   --   --   --  5.3*  --   ALBUMIN 2.3*  --  2.3*  --   --   --   --  2.4*  --   AST 17  --  19  --   --   --   --  13*  --   ALT 61*  --  50*  --   --   --   --  31  --   ALKPHOS 68  --  76  --   --   --   --  59  --   BILITOT 0.7  --  0.4  --   --   --   --  0.6  --   GFRNONAA 55* 49*  --   --  >60  --   --  >60  --   GFRAA >60 57*  --   --  >60  --   --  >60  --   ANIONGAP 9 8  --   --  9  --   --  8  --    < > = values in this interval not displayed.     Hematology Recent Labs  Lab 12/26/18 0244  12/27/18 0342  12/27/18 2209 12/28/18 0234 12/28/18 0239  WBC 12.2*  --  8.6  --   --  8.2  --   RBC 3.70*  --  3.54*  --   --  3.38*  --   HGB 9.0*   < > 8.7*   < > 9.2* 8.3* 8.8*  HCT 30.4*   < > 29.3*   < > 27.0* 27.9* 26.0*  MCV 82.2  --  82.8  --   --  82.5  --   MCH 24.3*  --  24.6*  --   --  24.6*  --   MCHC 29.6*  --  29.7*  --   --  29.7*  --   RDW 22.3*  --  22.3*  --   --  22.1*  --   PLT 255  --  297  --   --  367  --    < > = values in this interval not displayed.    Cardiac EnzymesNo results for input(s): TROPONINI in the last 168 hours. No results for input(s): TROPIPOC in the last 168 hours.   BNPNo results for input(s): BNP, PROBNP in the last 168 hours.   DDimer No results for input(s): DDIMER in the last 168 hours.   Radiology    Ct  Chest Wo Contrast  Result Date: 12/26/2018 CLINICAL DATA:  Followup for thoracic aortic aneurysm. Patient underwent ascending aorta surgery on 12/20/2018. EXAM: CT CHEST WITHOUT CONTRAST TECHNIQUE: Multidetector CT imaging of the chest was performed following the standard protocol without IV contrast. COMPARISON:  12/20/2018 FINDINGS: Cardiovascular: Ascending aorta graft extends from just above the aortic valve to just proximal to the origin of the innominate artery. Graft measures 3.2 cm anterior-posterior. Just distal to the graft, proximal arch is dilated to approximately 4 cm. The arch decreases caliber becoming normal in caliber throughout the descending thoracic aorta. Atherosclerotic calcifications are noted across the arch and descending thoracic aorta, stable. Fluid attenuation surrounds the ascending aorta extending to the posterior margin of the sternum. This fluid has Hounsfield units averaging around 10. The pericardial effusion noted on the prior exam is mostly resolved with  only a minimal amount of pericardial fluid evident. Heart is mildly enlarged. There are three-vessel coronary artery calcifications. Enlarged main pulmonary artery measuring 4.1 cm, stable. Mediastinum/Nodes: Right internal jugular central venous line. Tip projects in the upper superior vena cava. No mediastinal or hilar masses. No enlarged lymph nodes. Trachea is patent. Esophagus is grossly unremarkable. Lungs/Pleura: Small right and minimal left pleural effusions. Dependent lower lobe opacities are noted consistent with atelectasis. Additional opacity is noted right upper lobe adjacent to the minor fissure also consistent with atelectasis. Bilateral interstitial thickening. No pneumothorax. Upper Abdomen: No acute abnormality. Musculoskeletal: Changes from the recent median sternotomy. No bone lesions. IMPRESSION: 1. Status post repair of an ascending thoracic aortic aneurysm. Graft extends above the aortic valve to just  proximal to the origin the innominate artery. Graft measures 3.2 cm anterior-posterior. 2. There is fluid surrounding ascending aorta and throughout much of the anterior mediastinum. The previously seen pericardial effusion is mostly resolved. 3. Small right and minimal left pleural effusions. Also interstitial thickening in the lungs. Mild interstitial pulmonary edema suspected. 4. Lungs also demonstrate dependent atelectasis mostly in the lower lobes. 5. No pneumothorax. Aortic Atherosclerosis (ICD10-I70.0). Electronically Signed   By: Lajean Manes M.D.   On: 12/26/2018 17:36   Dg Chest Port 1 View  Result Date: 12/28/2018 CLINICAL DATA:  Pleural effusion EXAM: PORTABLE CHEST 1 VIEW COMPARISON:  Chest x-rays dated 12/26/2018 and 12/25/2018. Chest CT dated 12/26/2018. FINDINGS: Stable cardiomegaly. Stable bibasilar opacities, compatible with the combination of atelectasis and small pleural effusions demonstrated on chest CT of 12/26/2018. No new lung findings. No pneumothorax is seen. Median sternotomy wires appear intact and stable in alignment. IMPRESSION: Stable chest x-ray. Stable bibasilar opacities, compatible with the combination of atelectasis and small pleural effusions demonstrated on chest CT of 10/30/202020. Electronically Signed   By: Franki Cabot M.D.   On: 12/28/2018 08:50    Cardiac Studies   none  Patient Profile     85 y.o. female admitted with ascending aortic dissection, s/p repair  Assessment & Plan    1. aoritc dissection - she is s/p repair, doing remarkably well.  2. Respiratory failure - slowly improving.  3. Atrial fib - her rates are reasonably controlled. Continue beta blocker as her bp allows. 4. Hypotension - her blood pressure a little soft. Hopefully this will improve as she mobilizes fluid    For questions or updates, please contact [place] Please consult [email or web address] for contact info under Cardiology/STEMI.   Signed, Cristopher Peru, MD  12/28/2018,  9:42 AM  Patient ID: Kaitlyn Rodriguez, female   DOB: 1933-09-19, 85 y.o.   MRN: [id number]

## 2018-12-28 NOTE — Progress Notes (Signed)
NAME:  Kaitlyn Rodriguez, MRN:  [id number], DOB:  August 10, 1933, LOS: 7 ADMISSION DATE:  12/20/2018, CONSULTATION DATE:  12/26/18 REFERRING MD:  Vickey SagesAtkins, CVTS, CHIEF COMPLAINT:  AMS/Hypercapnia   Brief History   85 yo F POD6 repair of type A aortic dissection. Hypercapnia with lethargy noted 10/29, placed on BiPAP in PM. Remains on BiPAP 10/30; PCCM consulted   History of present illness   85 yo F POD 6 type A aortic dissection repair. Patient presented 10/24 with CC sudden onset shoulder pain, SOB. At time of admission, known PMH included hypothyroidism . SOB initially thought to be related to sinus congestion for which patient attempted OTC therapies without relief. Upon onset of sudden, severe shoulder pain, patient presented to ED. In ED, patient tachycardic 130 and soft BP 99/69, SpO2 95% on 2LNC. Initial concern for STEMI with ST elevation V4-6, VI, AVL. CXR revealed widened mediastinum. POCUS performed by cardiology which revealed large pericardial effusion, dilated IVC-- concerning for tamponade. CTA revealed Type A aortic dissection.   Placed on BiPAP 10/29 PM for hypercapnia with lethargy. Follow up ABG with some improvement in hypercapnia and overnight waxing/waning improvement in mental status. Reportedly overnight patient was more alert, came off BiPAP to eat and was then placed back on for remainder of night. 10/30 patient remains lethargic and on BiPAP. PCCM consulted for evaluation.   Past Medical History  Type A Aortic dissection, s/p repair  Atrial fibrillation Hypothyroidism  Prediabetes   Significant Hospital Events   10/24 admitted 10/25 OR for repair of type A dissection with CVTS. Post-op to ICU. Extubated in ICU  10/26 off pressors  10/27 oriented to name and place, not time or situation. Concerning for delirium vs sundowning  10/28 hypotensive and receiving albumin 10/29 in afib, did not sleep well. Continues to appear fluid overloaded. ABG with PCo2 66, placed on BiPAP.  Overnight patient more alert and ate dinner. 10/30 somnolent and remains on BiPAP. PCCM consulted for evaluation  11/1 17 L +, Clearly states she does not want HD or intubation  Consults:  Cards CVTS PCCM Procedures:  10/25 repair of type A aortic dissection   Significant Diagnostic Tests:  10/24 widened mediastinuj  10/24 CTA> type A aortic dissection 12/28/2018 CT Head Extensive white matter hypoattenuation bilaterally. While this is likely chronic. White matter infarcts may be obscured. No acute or focal cortical infarct. Atherosclerosis. Micro Data:  10/24 SARS CoV2> neg 10/25 MRSA > neg   Antimicrobials:  None  Interim history/subjective:  Off BiPAP this am and she is awake and alert and appropriate She knows date, place, president and that she has had recent surgery Remains BiPAP dependent at night and prn She states she is "dry" from the BiPAP She states she is suffering and she is ready to die BP remains soft Agrees to BiPAP prn but complains that the mask is too tight   Objective   Blood pressure 99/67, pulse 69, temperature 97.6 F (36.4 C), temperature source Oral, resp. rate 20, height 5\' 5"  (1.651 m), weight 105.9 kg, SpO2 100 %.    FiO2 (%):  [40 %] 40 %   Intake/Output Summary (Last 24 hours) at 12/28/2018 1144 Last data filed at 12/28/2018 1000 Gross per 24 hour  Intake 282.27 ml  Output 1355 ml  Net -1072.73 ml   Filed Weights   12/26/18 0500 12/27/18 0500 12/28/18 0500  Weight: 104.5 kg 105.9 kg 105.9 kg    Examination: General: chronically ill appearing older adult F ,  awake and conversive HENT: NCAT.Dry Pink mm. Anicteric sclera, trachea midline  Lungs: Bilateral , symmetrical chest excursion, Diminished bibasilar sounds.  no accessory muscle use Cardiovascular: IRIR s1s2. Cap refill < 3 seconds. BLE edema, a fib per tele  Abdomen: Soft round ndnt. + bowel sounds, Body mass index is 38.85 kg/m., Obese Extremities: Symmetrical bulk and tone.  Deconditioned, No cyanosis, no clubbing, no obvious deformities  Neuro: Alert to place and date, president  following commands, oriented to person, appropriate GU: Not done Skin: pale, clean, dry, warm. Surgical incisions clean, dry well approximated without bleeding or drainage   Resolved Hospital Problem list     Assessment & Plan:  Dissecting type A thoracic aortic aneurysm, s/p repair P -per CVTS  Hypercapnic respiratory failure requiring BiPAP -suspect likely chronic hypercapnia>> OHS/OSA R pleural effusion  + 17 L since admission P -Trend CXR -ABG prn -Continue BiPAP at bedtime and prn -SpO2 goal 88-92% -NPO -IS Q 1 while awake as able - Diuresis per primary team - BDs as ordered( Xopenex)  Delirium>> resolved Awake and alert to person, place, recent events, time and president -P -minimize opioids as able -NPO  - Trend  LFTS, ammonia  HFrEF Atrial Fibrillation P -Cardiology following appreciate recs : metop and amiodarone - Diuresis per CVTS - Mag now and in am  - K goal > 4 - Replete electrolytes as needed  Hypothyroidism P -Continue Synthroid  Discussion: Pt. Is clear mentally this am, and she states she does not want the BiPAP machine, hemodialysis, intubation or CPR/ defib/ ACLD drugs. She knows it is 12/28/2018. She knows who the president is,and that she had recent heart surgery. She states that she is suffering . She agrees to medical management only,  per the conversation with patient  And son 11/1 am with Dr. Everardo All and myself present. She does agree to BiPAP as needed. She was offered a palliative consult and was interested in pursuing further goals of care conversations with palliative care and patient 's son.    Will consult Palliative care for GOC conversations  We have asked TCTS ( Dr. Cliffton Asters) to address code status as the patient was clear in her desire for no CPR, No Defib, No intubation and no ACLS interventions in setting of cardiac  arrest.    Best practice:  Diet: NPO Pain/Anxiety/Delirium protocol (if indicated): APAP, Oxy  VAP protocol (if indicated): na DVT prophylaxis: eliquis GI prophylaxis: protonix Glucose control: moniotor Mobility: PT Code Status: Full Family Communication: Per Primary Disposition: Transfer to ICU   Labs   CBC: Recent Labs  Lab 12/22/18 0733  12/23/18 0312 12/24/18 0821 12/25/18 0500 12/26/18 0244  12/27/18 0342  12/27/18 0938 12/27/18 1709 12/27/18 2209 12/28/18 0234 12/28/18 0239  WBC 16.5*   < > 13.9* 20.4* 16.1* 12.2*  --  8.6  --   --   --   --  8.2  --   NEUTROABS 13.8*  --  10.9* 19.0*  --   --   --   --   --   --   --   --   --   --   HGB 8.8*   < > 8.4* 7.6* 9.2* 9.0*   < > 8.7*   < > 9.5* 10.5* 9.2* 8.3* 8.8*  HCT 28.0*   < > 27.7* 25.6* 31.6* 30.4*   < > 29.3*   < > 28.0* 31.0* 27.0* 27.9* 26.0*  MCV 76.5*   < > 76.9* 80.5 82.5 82.2  --  82.8  --   --   --   --  82.5  --   PLT 164   < > 175 155 207 255  --  297  --   --   --   --  367  --    < > = values in this interval not displayed.    Basic Metabolic Panel: Recent Labs  Lab 12/22/18 1900  12/24/18 0981 12/25/18 1640 12/26/18 0244  12/27/18 0342  12/27/18 0938 12/27/18 1709 12/27/18 2209 12/28/18 0234 12/28/18 0239  NA 134*   < > 131* 132* 132*   < > 134*   < > 133* 134* 134* 136 135  K 3.6   < > 3.9 4.7 4.5   < > 4.0   < > 4.1 4.2 4.0 4.1 4.0  CL 100   < > 98 96* 96*  --  98  --   --   --   --  98  --   CO2 25   < > --  27  --   --   --   --  30  --   GLUCOSE 129*   < > 181* 116* 112*  --  98  --   --   --   --  102*  --   BUN 20   < > 14 23 24*  --  23  --   --   --   --  25*  --   CREATININE 0.71   < > 0.82 0.95 1.05*  --  0.84  --   --   --   --  0.85  --   CALCIUM 7.8*   < > 7.0* 8.3* 8.2*  --  8.3*  --   --   --   --  8.3*  --   MG 2.3  --   --   --   --   --  2.0  --   --   --   --  2.0  --    < > = values in this interval not displayed.   GFR: Estimated Creatinine  Clearance: 60.6 mL/min (by C-G formula based on SCr of 0.85 mg/dL). Recent Labs  Lab 12/25/18 0500 12/26/18 0244 12/27/18 0342 12/28/18 0234  WBC 16.1* 12.2* 8.6 8.2    Liver Function Tests: Recent Labs  Lab 12/25/18 1640 12/26/18 1148 12/28/18 0234  AST 17 19 13*  ALT 61* 50* 31  ALKPHOS 68 76 59  BILITOT 0.7 0.4 0.6  PROT 5.0* 5.2* 5.3*  ALBUMIN 2.3* 2.3* 2.4*   No results for input(s): LIPASE, AMYLASE in the last 168 hours. Recent Labs  Lab 12/26/18 1148  AMMONIA 16    ABG    Component Value Date/Time   PHART 7.282 (L) 12/28/2018 0239   PCO2ART 65.7 (HH) 12/28/2018 0239   PO2ART 104.0 12/28/2018 0239   HCO3 31.1 (H) 12/28/2018 0239   TCO2 33 (H) 12/28/2018 0239   ACIDBASEDEF 1.0 12/21/2018 1214   O2SAT 97.0 12/28/2018 0239     Coagulation Profile: No results for input(s): INR, PROTIME in the last 168 hours.  Cardiac Enzymes: No results for input(s): CKTOTAL, CKMB, CKMBINDEX, TROPONINI in the last 168 hours.  HbA1C: Hgb A1c MFr Bld  Date/Time Value Ref Range Status  12/22/2018 04:07 AM 6.3 (H) 4.8 - 5.6 % Final    Comment:    (NOTE) Pre diabetes:  5.7%-6.4% Diabetes:              >6.4% Glycemic control for   <7.0% adults with diabetes     CBG: Recent Labs  Lab 12/27/18 0817 12/27/18 1950 12/27/18 2344 12/28/18 0348 12/28/18 0827  GLUCAP 92 120* 104* 89 89    Review of Systems:   Unable to obtain from patient due to acute delirium   Past Medical History  She,  has a past medical history of Thyroid disease.   Surgical History    Past Surgical History:  Procedure Laterality Date  . ABDOMINAL HYSTERECTOMY     at age 68  . REPLACEMENT ASCENDING AORTA N/A 12/20/2018   Procedure: REPAIR OF TYPE A AORTIC DISSECTION USING HEMASHIELD PLATINUM 28MM AND 32MM GRAFT;  Surgeon: Wonda Olds, MD;  Location: [facility];  Service: Open Heart Surgery;  Laterality: N/A;  . STERNAL CLOSURE  12/20/2018   Procedure: STERNAL PLATING;  Surgeon:  Wonda Olds, MD;  Location: [facility];  Service: Open Heart Surgery;;     Social History   reports that she has never smoked. She has never used smokeless tobacco. She reports current alcohol use. She reports that she does not use drugs.   Family History   Her family history is not on file.   Allergies No Known Allergies   Home Medications  Prior to Admission medications   Medication Sig Start Date End Date Taking? Authorizing Provider  levothyroxine (SYNTHROID) 150 MCG tablet Take 150 mcg by mouth daily before breakfast.   Yes [provider]     Critical care time: 68  min      Magdalen Spatz, MSN, AGACNP-BC [place] Pager # [phone] After 4 pm please call [phone] 12/28/2018, 11:44 AM

## 2018-12-28 NOTE — Plan of Care (Signed)
Voicemail left with palliative care concerning patient's wishes in regards to goals of care and treatment. Will continue to monitor and assess.

## 2018-12-28 NOTE — Plan of Care (Signed)
Upon entering the pt's room to provide mouth swabs and cool water to moisten the pt's oral cavity, the pt's son approached the writer of this note to inform that the pt would like to go through with intubation and dialysis on the basis that it would be the last invasive procedure that she would want during this hospitalization. The Probation officer of this note questioned the pt directly of her wishes to confirm that this is what she wants despite her previous requests. The pt confirmed that she would like to be intubated and have dialysis done to remove the fluid that has accumulated within her body. MD. Kipp Brood notified via phone call. [facility] unable to meet with the family at this time as he is not on campus and the Probation officer of this note is to let CCM team know of the pt's wishes. Loanne Drilling, MD notified via page. Will continue to monitor and assess.

## 2018-12-28 NOTE — Progress Notes (Signed)
[facility]  notified of most recent ABG.

## 2018-12-28 NOTE — Progress Notes (Signed)
      [address]       [employer] [address]             [phone]                 8 Days Post-Op Procedure(s) (LRB): REPAIR OF TYPE A AORTIC DISSECTION USING HEMASHIELD PLATINUM 28MM AND 32MM GRAFT (N/A) STERNAL PLATING   Events: On bipap overnight Low blood pressure _______________________________________________________________ Vitals: BP 113/75   Pulse 64   Temp 97.6 F (36.4 C) (Oral)   Resp (!) 23   Ht 5\' 5"  (1.651 m)   Wt 105.9 kg   SpO2 100%   BMI 38.85 kg/m   - Neuro: more lethargic this am  - Cardiovascular: afib  Drips: none, amio bolus, on PO.      - Pulm: coarse.  On HF FiO2 (%):  [40 %] 40 %  ABG    Component Value Date/Time   PHART 7.282 (L) 12/28/2018 0239   PCO2ART 65.7 (HH) 12/28/2018 0239   PO2ART 104.0 12/28/2018 0239   HCO3 31.1 (H) 12/28/2018 0239   TCO2 33 (H) 12/28/2018 0239   ACIDBASEDEF 1.0 12/21/2018 1214   O2SAT 97.0 12/28/2018 0239    - Abd: soft/ND - Extremity: edematous  .Intake/Output      10/31 0701 - 11/01 0700 11/01 0701 - 11/02 0700   P.O. 240    IV Piggyback 42.3    Total Intake(mL/kg) 282.3 (2.7)    Urine (mL/kg/hr) 1475 (0.6)    Stool 0    Total Output 1475    Net -1192.7         Stool Occurrence 2 x       _______________________________________________________________ Labs: CBC Latest Ref Rng & Units 12/28/2018 12/28/2018 12/27/2018  WBC 4.0 - 10.5 K/uL - 8.2 -  Hemoglobin 12.0 - 15.0 g/dL 8.8(L) 8.3(L) 9.2(L)  Hematocrit 36.0 - 46.0 % 26.0(L) 27.9(L) 27.0(L)  Platelets 150 - 400 K/uL - 367 -   CMP Latest Ref Rng & Units 12/28/2018 12/28/2018 12/27/2018  Glucose 70 - 99 mg/dL - 102(H) -  BUN 8 - 23 mg/dL - 25(H) -  Creatinine 0.44 - 1.00 mg/dL - 0.85 -  Sodium 135 - 145 mmol/L 135 136 134(L)  Potassium 3.5 - 5.1 mmol/L 4.0 4.1 4.0  Chloride 98 - 111 mmol/L - 98 -  CO2 22 - 32 mmol/L - 30 -  Calcium 8.9 - 10.3 mg/dL - 8.3(L) -  Total Protein 6.5 - 8.1 g/dL - 5.3(L) -  Total  Bilirubin 0.3 - 1.2 mg/dL - 0.6 -  Alkaline Phos 38 - 126 U/L - 59 -  AST 15 - 41 U/L - 13(L) -  ALT 0 - 44 U/L - 31 -    CXR: PV congestion  _______________________________________________________________  Assessment and Plan: POD 8 s/p type A dissection repair.  Transferred to the unit for respiratory issues  Neuro: pain controlled.  Some depression CV: stable Pulm: unable to diurese due to low blood pressure.  Hypercapnea.  Will intubate due concern for respiratory failur Renal: + 17L.  Did not tolerate IV diuresis.  Have discussed CRRT with CCM as well GI: NPO Heme: stable ID: [email or web address]  Endo: SSI Dispo: continue ICU care  Melodie Bouillon, MD 12/28/2018 10:22 AM

## 2018-12-29 ENCOUNTER — Inpatient Hospital Stay (HOSPITAL_COMMUNITY): Payer: Medicare Other

## 2018-12-29 DIAGNOSIS — I4819 Other persistent atrial fibrillation: Secondary | ICD-10-CM

## 2018-12-29 DIAGNOSIS — R4182 Altered mental status, unspecified: Secondary | ICD-10-CM

## 2018-12-29 LAB — GLUCOSE, CAPILLARY
Glucose-Capillary: 100 mg/dL — ABNORMAL HIGH (ref 70–99)
Glucose-Capillary: 103 mg/dL — ABNORMAL HIGH (ref 70–99)
Glucose-Capillary: 124 mg/dL — ABNORMAL HIGH (ref 70–99)
Glucose-Capillary: 130 mg/dL — ABNORMAL HIGH (ref 70–99)
Glucose-Capillary: 75 mg/dL (ref 70–99)
Glucose-Capillary: 86 mg/dL (ref 70–99)
Glucose-Capillary: 99 mg/dL (ref 70–99)

## 2018-12-29 LAB — BASIC METABOLIC PANEL
Anion gap: 9 (ref 5–15)
BUN: 22 mg/dL (ref 8–23)
CO2: 30 mmol/L (ref 22–32)
Calcium: 8.5 mg/dL — ABNORMAL LOW (ref 8.9–10.3)
Chloride: 99 mmol/L (ref 98–111)
Creatinine, Ser: 0.77 mg/dL (ref 0.44–1.00)
GFR calc Af Amer: >60 mL/min (ref >60)
GFR calc non Af Amer: >60 mL/min (ref >60)
Glucose, Bld: 95 mg/dL (ref 70–99)
Potassium: 3.8 mmol/L (ref 3.5–5.1)
Sodium: 138 mmol/L (ref 135–145)

## 2018-12-29 LAB — RENAL FUNCTION PANEL
Albumin: 2.6 g/dL — ABNORMAL LOW (ref 3.5–5.0)
Anion gap: 10 (ref 5–15)
BUN: 24 mg/dL — ABNORMAL HIGH (ref 8–23)
CO2: 33 mmol/L — ABNORMAL HIGH (ref 22–32)
Calcium: 8.4 mg/dL — ABNORMAL LOW (ref 8.9–10.3)
Chloride: 93 mmol/L — ABNORMAL LOW (ref 98–111)
Creatinine, Ser: 0.82 mg/dL (ref 0.44–1.00)
GFR calc Af Amer: >60 mL/min (ref >60)
GFR calc non Af Amer: >60 mL/min (ref >60)
Glucose, Bld: 122 mg/dL — ABNORMAL HIGH (ref 70–99)
Phosphorus: 2.7 mg/dL (ref 2.5–4.6)
Potassium: 3.1 mmol/L — ABNORMAL LOW (ref 3.5–5.1)
Sodium: 136 mmol/L (ref 135–145)

## 2018-12-29 LAB — CBC
HCT: 30.2 % — ABNORMAL LOW (ref 36.0–46.0)
Hemoglobin: 9 g/dL — ABNORMAL LOW (ref 12.0–15.0)
MCH: 24.5 pg — ABNORMAL LOW (ref 26.0–34.0)
MCHC: 29.8 g/dL — ABNORMAL LOW (ref 30.0–36.0)
MCV: 82.1 fL (ref 80.0–100.0)
Platelets: 437 10*3/uL — ABNORMAL HIGH (ref 150–400)
RBC: 3.68 MIL/uL — ABNORMAL LOW (ref 3.87–5.11)
RDW: 22.2 % — ABNORMAL HIGH (ref 11.5–15.5)
WBC: 8.6 10*3/uL (ref 4.0–10.5)
nRBC: 0 % (ref 0.0–0.2)

## 2018-12-29 LAB — POCT I-STAT 7, (LYTES, BLD GAS, ICA,H+H)
Acid-Base Excess: 5 mmol/L — ABNORMAL HIGH (ref 0.0–2.0)
Bicarbonate: 31.3 mmol/L — ABNORMAL HIGH (ref 20.0–28.0)
Calcium, Ion: 1.24 mmol/L (ref 1.15–1.40)
HCT: 31 % — ABNORMAL LOW (ref 36.0–46.0)
Hemoglobin: 10.5 g/dL — ABNORMAL LOW (ref 12.0–15.0)
O2 Saturation: 96 %
Patient temperature: 98.2
Potassium: 3.9 mmol/L (ref 3.5–5.1)
Sodium: 136 mmol/L (ref 135–145)
TCO2: 33 mmol/L — ABNORMAL HIGH (ref 22–32)
pCO2 arterial: 56.6 mmHg — ABNORMAL HIGH (ref 32.0–48.0)
pH, Arterial: 7.35 (ref 7.350–7.450)
pO2, Arterial: 91 mmHg (ref 83.0–108.0)

## 2018-12-29 LAB — MAGNESIUM: Magnesium: 1.9 mg/dL (ref 1.7–2.4)

## 2018-12-29 MED ORDER — POTASSIUM CHLORIDE CRYS ER 20 MEQ PO TBCR
40.0000 meq | EXTENDED_RELEASE_TABLET | Freq: Once | ORAL | Status: AC
  Administered 2018-12-29: 20:00:00 40 meq via ORAL
  Filled 2018-12-29: qty 2

## 2018-12-29 MED ORDER — QUETIAPINE FUMARATE 25 MG PO TABS
25.0000 mg | ORAL_TABLET | Freq: Every evening | ORAL | Status: DC | PRN
  Administered 2018-12-29 – 2018-12-30 (×2): 25 mg via ORAL
  Filled 2018-12-29 (×2): qty 1

## 2018-12-29 MED ORDER — APIXABAN 5 MG PO TABS
5.0000 mg | ORAL_TABLET | Freq: Two times a day (BID) | ORAL | Status: DC
  Administered 2018-12-29 – 2019-01-16 (×38): 5 mg via ORAL
  Filled 2018-12-29 (×39): qty 1

## 2018-12-29 MED ORDER — METOPROLOL TARTRATE 25 MG/10 ML ORAL SUSPENSION: 25.0000 mg | Freq: Three times a day (TID) | ORAL | Status: DC

## 2018-12-29 MED ORDER — METOLAZONE 5 MG PO TABS
5.0000 mg | ORAL_TABLET | Freq: Once | ORAL | Status: AC
  Administered 2018-12-29: 09:00:00 5 mg via ORAL
  Filled 2018-12-29: qty 1

## 2018-12-29 MED ORDER — METOPROLOL TARTRATE 25 MG PO TABS
25.0000 mg | ORAL_TABLET | Freq: Three times a day (TID) | ORAL | Status: DC
  Administered 2018-12-29 – 2018-12-31 (×5): 25 mg via ORAL
  Filled 2018-12-29 (×7): qty 1

## 2018-12-29 NOTE — Progress Notes (Signed)
Patient is now back on NIV, Goal is to maintain good ventilation and oxygenation via utilization of NIV machine using appropriate settings to meet systemic and myocardial oxygen demands to assure adequate tissue perfusion. Patient keeps trying to remove NIV mask. Patient is stable at this time no distress noted.

## 2018-12-29 NOTE — Progress Notes (Signed)
NAME:  Kaitlyn Rodriguez, MRN:  [id number], DOB:  [birth year], LOS: 8 ADMISSION DATE:  12/20/2018, CONSULTATION DATE:  12/26/18 REFERRING MD:  Orvan Seen, CVTS, CHIEF COMPLAINT:  AMS/Hypercapnia   Brief History   85 yo F POD6 repair of type A aortic dissection. Hypercapnia with lethargy noted 10/29, placed on BiPAP in PM. Remains on BiPAP 10/30; PCCM consulted.  Past Medical History  Type A Aortic dissection, s/p repair  Atrial fibrillation Hypothyroidism  Prediabetes   Significant Hospital Events   10/24 admitted 10/25 OR for repair of type A dissection with CVTS. Post-op to ICU. Extubated in ICU  10/26 off pressors  10/27 oriented to name and place, not time or situation. Concerning for delirium vs sundowning  10/28 hypotensive and receiving albumin 10/29 in afib, did not sleep well. Continues to appear fluid overloaded. ABG with PCo2 66, placed on BiPAP. Overnight patient more alert and ate dinner. 10/30 somnolent and remains on BiPAP. PCCM consulted for evaluation  11/1 17 L +, Clearly states she does not want HD or intubation 11/2 several GOC discussions and code status changes. Final verdict seems to be DNR but OK to try HD.   Consults:  Cards CVTS PCCM Procedures:  10/25 repair of type A aortic dissection   Significant Diagnostic Tests:  10/24 widened mediastinuj  10/24 CTA> type A aortic dissection 12/28/2018 CT Head Extensive white matter hypoattenuation bilaterally. While this is likely chronic. White matter infarcts may be obscured. No acute or focal cortical infarct. Atherosclerosis. Micro Data:  10/24 SARS CoV2> neg 10/25 MRSA > neg   Antimicrobials:  None  Interim history/subjective:  C/o mild dyspnea: Greatest complaint is thirst and sleeplessness.  She is asking for a [employer] and a sleep aid.   Objective   Blood pressure (!) 142/99, pulse 87, temperature 98.2 F (36.8 C), temperature source Oral, resp. rate (!) 27, height 5\' 5"  (1.651 m), weight 107.5 kg,  SpO2 100 %.    FiO2 (%):  [40 %] 40 %   Intake/Output Summary (Last 24 hours) at 12/29/2018 0739 Last data filed at 12/29/2018 0700 Gross per 24 hour  Intake 179.86 ml  Output 1960 ml  Net -1780.14 ml   Filed Weights   12/27/18 0500 12/28/18 0500 12/29/18 0500  Weight: 105.9 kg 105.9 kg 107.5 kg    Examination:  General:  Elderly female in NAD Neuro:  Alert, oriented, non-focal HEENT:  [place]/AT, No JVD noted, PERRL Cardiovascular:  RRR, no MRG Lungs:  Clear bilateral breath sounds Abdomen:  Soft, non-distended Musculoskeletal:  No acute deformity Skin:  Intact, MMM  Resolved Hospital Problem list     Assessment & Plan:  Dissecting type A thoracic aortic aneurysm, s/p repair 10/25 P -per CVTS  Hypercarbic/Hypoxic respiratory failure (acute): In the setting of volume overload and pulmonary edema. Improved with nocturnal and PRN BiPAP. Remains 17L positive, but is improving with Lasix infusion.  - Nocturnal and PRN BiPAP - Supplemental O2 for SpO2 goal 88-92% - Lasix infusion per primary/nephrology (almost 2L negative yesterday) - Holding off on HD for now.  - Hesitant to provider her an additional sleep aid as requested in this setting, perhaps melatonin would make sense if she is able to stay awake today. Need to establish a[ppropriate sleep wake cycle.  - Does not want intubation  Delirium:  Improved with BiPAP QHS and seroquel.   Goals of Care: Multiple discussions 11/2. Verdict as far as I can tell is DNR, DNI. Would be OK with HD if needed.  Best practice:  Diet: NPO Pain/Anxiety/Delirium protocol (if indicated): APAP, Oxy  VAP protocol (if indicated): na DVT prophylaxis: eliquis GI prophylaxis: protonix Glucose control: moniotor Mobility: PT Code Status: Full Family Communication: Per Primary Disposition: Transfer to ICU   Labs   CBC: Recent Labs  Lab 12/23/18 0312 12/24/18 0821 12/25/18 0500 12/26/18 0244  12/27/18 0342  12/27/18 2209 12/28/18  0234 12/28/18 0239 12/28/18 2129 12/29/18 0309  WBC 13.9* 20.4* 16.1* 12.2*  --  8.6  --   --  8.2  --   --  8.6  NEUTROABS 10.9* 19.0*  --   --   --   --   --   --   --   --   --   --   HGB 8.4* 7.6* 9.2* 9.0*   < > 8.7*   < > 9.2* 8.3* 8.8* 9.5* 9.0*  HCT 27.7* 25.6* 31.6* 30.4*   < > 29.3*   < > 27.0* 27.9* 26.0* 28.0* 30.2*  MCV 76.9* 80.5 82.5 82.2  --  82.8  --   --  82.5  --   --  82.1  PLT 175 155 207 255  --  297  --   --  367  --   --  437*   < > = values in this interval not displayed.    Basic Metabolic Panel: Recent Labs  Lab 12/22/18 1900  12/25/18 1640 12/26/18 0244  12/27/18 0342  12/27/18 2209 12/28/18 0234 12/28/18 0239 12/28/18 2129 12/29/18 0309  NA 134*   < > 132* 132*   < > 134*   < > 134* 136 135 136 138  K 3.6   < > 4.7 4.5   < > 4.0   < > 4.0 4.1 4.0 4.0 3.8  CL 100   < > 96* 96*  --  98  --   --  98  --   --  99  CO2 25   < > 27 28  --  27  --   --  30  --   --  30  GLUCOSE 129*   < > 116* 112*  --  98  --   --  102*  --   --  95  BUN 20   < > 23 24*  --  23  --   --  25*  --   --  22  CREATININE 0.71   < > 0.95 1.05*  --  0.84  --   --  0.85  --   --  0.77  CALCIUM 7.8*   < > 8.3* 8.2*  --  8.3*  --   --  8.3*  --   --  8.5*  MG 2.3  --   --   --   --  2.0  --   --  2.0  --   --  1.9   < > = values in this interval not displayed.   GFR: Estimated Creatinine Clearance: 64.9 mL/min (by C-G formula based on SCr of 0.77 mg/dL). Recent Labs  Lab 12/26/18 0244 12/27/18 0342 12/28/18 0234 12/29/18 0309  WBC 12.2* 8.6 8.2 8.6    Liver Function Tests: Recent Labs  Lab 12/25/18 1640 12/26/18 1148 12/28/18 0234  AST 17 19 13*  ALT 61* 50* 31  ALKPHOS 68 76 59  BILITOT 0.7 0.4 0.6  PROT 5.0* 5.2* 5.3*  ALBUMIN 2.3* 2.3* 2.4*   No results for input(s): LIPASE, AMYLASE in the  last 168 hours. Recent Labs  Lab 12/26/18 1148  AMMONIA 16    ABG    Component Value Date/Time   PHART 7.354 12/28/2018 2129   PCO2ART 55.5 (H) 12/28/2018 2129    PO2ART 71.0 (L) 12/28/2018 2129   HCO3 31.2 (H) 12/28/2018 2129   TCO2 33 (H) 12/28/2018 2129   ACIDBASEDEF 1.0 12/21/2018 1214   O2SAT 94.0 12/28/2018 2129     Coagulation Profile: No results for input(s): INR, PROTIME in the last 168 hours.  Cardiac Enzymes: No results for input(s): CKTOTAL, CKMB, CKMBINDEX, TROPONINI in the last 168 hours.  HbA1C: Hgb A1c MFr Bld  Date/Time Value Ref Range Status  12/22/2018 04:07 AM 6.3 (H) 4.8 - 5.6 % Final    Comment:    (NOTE) Pre diabetes:          5.7%-6.4% Diabetes:              >6.4% Glycemic control for   <7.0% adults with diabetes     CBG: Recent Labs  Lab 12/28/18 1207 12/28/18 1639 12/28/18 2047 12/28/18 2358 12/29/18 0410  GLUCAP 114* 100* 92 86 99    Review of Systems:   Unable to obtain from patient due to acute delirium   Past Medical History  She,  has a past medical history of Thyroid disease.   Surgical History    Past Surgical History:  Procedure Laterality Date  . ABDOMINAL HYSTERECTOMY     at age 45  . REPLACEMENT ASCENDING AORTA N/A 12/20/2018   Procedure: REPAIR OF TYPE A AORTIC DISSECTION USING HEMASHIELD PLATINUM AND GRAFT;  Surgeon: Linden Dolin, MD;  Location: [facility];  Service: Open Heart Surgery;  Laterality: N/A;  . STERNAL CLOSURE  12/20/2018   Procedure: STERNAL PLATING;  Surgeon: Linden Dolin, MD;  Location: [facility];  Service: Open Heart Surgery;;     Social History   reports that she has never smoked. She has never used smokeless tobacco. She reports current alcohol use. She reports that she does not use drugs.   Family History   Her family history is not on file.   Allergies No Known Allergies   Home Medications  Prior to Admission medications   Medication Sig Start Date End Date Taking? Authorizing Provider  levothyroxine (SYNTHROID) 150 MCG tablet Take 150 mcg by mouth daily before breakfast.   Yes [provider]      Joneen Roach, AGACNP-BC  [facility] Pulmonary/Critical Care Pager [phone] or [phone]  12/29/2018 8:00 AM

## 2018-12-29 NOTE — Progress Notes (Addendum)
Pt. Keeps removing Bipap machine claiming that she can't sleep with it. Pt. Educated on the need for her Bipap right now but patient still refusing. Placed on 6 L O2. Oxygen at 95%

## 2018-12-29 NOTE — Progress Notes (Signed)
Kaitlyn Rodriguez NEPHROLOGY PROGRESS NOTE  Assessment/ Plan: Pt is a 85 y.o. 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  Per [facility].  #Volume overload/pulmonary edema: Agree with Lasix IV drip.  Patient has urine output around 2 L.  Serum creatinine level stable at 0.77.  I will add a dose of metolazone today.  Patient is currently DNR.  Try diuretics before attempting dialysis.  #Hypercapnic respiratory failure: Refusing BiPAP.  Due to fluid overload.  Pulmonary team is following.  Diuretics as above.  # Anemia: Monitor CBC  #Goals of care: Seen by palliative care.  Patient is DNR however agreed for dialysis if needed.  Discussed with the primary team and nursing staff  Subjective: Seen and examined at bedside.  Patient is alert awake and on oxygen.  She had around 2 L of urine output.  On IV Lasix. Objective Vital signs in last 24 hours: Vitals:   12/29/18 0700 12/29/18 0737 12/29/18 0755 12/29/18 0800  BP: (!) 142/99  135/70 (!) 145/87  Pulse: 100 87 99 88  Resp: (!) 22 (!) 27 (!) 26 (!) 34  Temp:  98.2 F (36.8 C)    TempSrc:  Oral    SpO2: 100% 100% 99% 100%  Weight:      Height:       Weight change: 1.6 kg  Intake/Output Summary (Last 24 hours) at 12/29/2018 0813 Last data filed at 12/29/2018 0700 Gross per 24 hour  Intake 179.86 ml  Output 1960 ml  Net -1780.14 ml       Labs: Basic Metabolic Panel: Recent Labs  Lab 12/27/18 0342  12/28/18 0234  12/28/18 2129 12/29/18 0309 12/29/18 0757  NA 134*   < > 136   < > 136 138 136  K 4.0   < > 4.1   < > 4.0 3.8 3.9  CL 98  --  98  --   --  99  --   CO2 27  --  30  --   --  30  --   GLUCOSE 98  --  102*  --   --  95  --   BUN 23  --  25*  --   --  22  --   CREATININE 0.84  --  0.85  --   --  0.77  --   CALCIUM 8.3*  --  8.3*  --   --  8.5*  --     < > = values in this interval not displayed.   Liver Function Tests: Recent Labs  Lab 12/25/18 1640 12/26/18 1148 12/28/18 0234  AST 17 19 13*  ALT 61* 50* 31  ALKPHOS 68 76 59  BILITOT 0.7 0.4 0.6  PROT 5.0* 5.2* 5.3*  ALBUMIN 2.3* 2.3* 2.4*   No results for input(s): LIPASE, AMYLASE in the last 168 hours. Recent Labs  Lab 12/26/18 1148  AMMONIA 16   CBC: Recent Labs  Lab 12/23/18 0312 12/24/18 0821 12/25/18 0500 12/26/18 0244  12/27/18 0342  12/28/18 0234  12/28/18 2129 12/29/18 0309 12/29/18 0757  WBC 13.9* 20.4* 16.1* 12.2*  --  8.6  --  8.2  --   --  8.6  --   NEUTROABS 10.9* 19.0*  --   --   --   --   --   --   --   --   --   --  HGB 8.4* 7.6* 9.2* 9.0*   < > 8.7*   < > 8.3*   < > 9.5* 9.0* 10.5*  HCT 27.7* 25.6* 31.6* 30.4*   < > 29.3*   < > 27.9*   < > 28.0* 30.2* 31.0*  MCV 76.9* 80.5 82.5 82.2  --  82.8  --  82.5  --   --  82.1  --   PLT 175 155 207 255  --  297  --  367  --   --  437*  --    < > = values in this interval not displayed.   Cardiac Enzymes: No results for input(s): CKTOTAL, CKMB, CKMBINDEX, TROPONINI in the last 168 hours. CBG: Recent Labs  Lab 12/28/18 1639 12/28/18 2047 12/28/18 2358 12/29/18 0410 12/29/18 0745  GLUCAP 100* 92 86 99 75    Iron Studies: No results for input(s): IRON, TIBC, TRANSFERRIN, FERRITIN in the last 72 hours. Studies/Results: Ct Head Wo Contrast  Result Date: 12/28/2018 CLINICAL DATA:  Ataxia and altered mental status following repair of aortic dissection. Stroke suspected. EXAM: CT HEAD WITHOUT CONTRAST TECHNIQUE: Contiguous axial images were obtained from the base of the skull through the vertex without intravenous contrast. COMPARISON:  None. FINDINGS: Brain: Moderate generalized atrophy and white matter disease is present bilaterally. There is some asymmetry white matter disease on the left. No acute or focal cortical abnormality is present. Basal ganglia are intact. Insular ribbon is normal  bilaterally. The ventricles are of normal size. No significant extraaxial fluid collection is present. Vascular: Atherosclerotic calcifications are present within the cavernous internal carotid arteries bilaterally. There is no hyperdense vessel. Skull: Calvarium is intact. No focal lytic or blastic lesions are present. Sinuses/Orbits: The paranasal sinuses and mastoid air cells are clear. The globes and orbits are within normal limits. IMPRESSION: 1. Extensive white matter hypoattenuation bilaterally. While this is likely chronic. White matter infarcts may be obscured. 2. No acute or focal cortical infarct. 3. Atherosclerosis. Electronically Signed   By: Marin Roberts M.D.   On: 12/28/2018 10:17   Dg Chest Port 1 View  Result Date: 12/29/2018 CLINICAL DATA:  Shortness of breath was respiratory failure EXAM: PORTABLE CHEST 1 VIEW COMPARISON:  December 28, 2018 FINDINGS: The lung volumes are low. The heart size remains enlarged. There are small to moderate-sized bilateral pleural effusions with adjacent airspace opacities favored to represent atelectasis. There is no pneumothorax. Vascular congestion is noted. Skin staples are projected over the right upper chest wall. IMPRESSION: Stable appearance of the chest when compared to 1 day prior. Electronically Signed   By: Katherine Mantle M.D.   On: 12/29/2018 05:57   Dg Chest Port 1 View  Result Date: 12/28/2018 CLINICAL DATA:  Pleural effusion EXAM: PORTABLE CHEST 1 VIEW COMPARISON:  Chest x-rays dated 12/26/2018 and 12/25/2018. Chest CT dated 12/26/2018. FINDINGS: Stable cardiomegaly. Stable bibasilar opacities, compatible with the combination of atelectasis and small pleural effusions demonstrated on chest CT of 12/26/2018. No new lung findings. No pneumothorax is seen. Median sternotomy wires appear intact and stable in alignment. IMPRESSION: Stable chest x-ray. Stable bibasilar opacities, compatible with the combination of atelectasis and small  pleural effusions demonstrated on chest CT of 10/30/202020. Electronically Signed   By: Bary Richard M.D.   On: 12/28/2018 08:50    Medications: Infusions: . sodium chloride    . furosemide (LASIX) infusion 8 mg/hr (12/29/18 0700)    Scheduled Medications: . sodium chloride   Intravenous Once  . amiodarone  400 mg  Oral BID  . [START ON 12/30/2018] apixaban  5 mg Oral BID  . aspirin  81 mg Oral Daily  . bisacodyl  10 mg Oral Daily   Or  . bisacodyl  10 mg Rectal Daily  . Chlorhexidine Gluconate Cloth  6 each Topical Daily  . docusate sodium  200 mg Oral Daily  . furosemide  40 mg Intravenous BID  . levalbuterol  0.63 mg Nebulization TID  . levothyroxine  150 mcg Oral QAC breakfast  . mouth rinse  15 mL Mouth Rinse BID  . metolazone  5 mg Oral Once  . metoprolol tartrate  25 mg Per Tube BID   Or  . metoprolol tartrate  25 mg Oral BID  . pantoprazole  40 mg Oral Daily  . potassium chloride  20 mEq Oral Daily  . sodium chloride flush  3 mL Intravenous Q12H    have reviewed scheduled and prn medications.  Physical Exam: General:NAD, comfortable Heart:RRR, s1s2 nl Lungs: Coarse breath sound bilateral, no wheezing Abdomen:soft, Non-tender, non-distended Extremities:No edema Neurology: Alert awake and following commands  Davon Folta Tanna Furry 12/29/2018,8:13 AM  LOS: 8 days  Pager: [phone]

## 2018-12-29 NOTE — Progress Notes (Signed)
PMT progress note  Patient awake alert, appears better than yesterday, she did not rest well overnight, how ever, doesn't seem to be in distress.   BP 132/82   Pulse (!) 102   Temp 99 F (37.2 C) (Oral)   Resp (!) 34   Ht 5\' 5"  (1.651 m)   Wt 107.5 kg   SpO2 100%   BMI 39.44 kg/m  Labs and imaging noted Chart reviewed, briefly discussed with PCCM MD. Renal services also following.   Chronically ill appearing lady Clear breath sounds anteriorly Irregular Abdomen mildly distended Has some edema Non focal, anxious affect, follows commands.   PPS 40%  Kaitlyn Rodriguez is a 85 year old lady with past medical history of atrial fibrillation, hypothyroidism.  She was admitted for aortic dissection and is status post repair    Postop, the patient has had acute hypercarbic respiratory failure.  She has required use of BiPAP.  She has had waxing and waning mental status and borderline blood pressures.  Ongoing discussions have previously been undertaken by primary team, critical care service with the patient and her son with regards to CODE STATUS, renal replacement, overall goals of care.  A palliative consultation has been requested for ongoing conversations of the above nature.  Plan: Continue current mode of care, renal services following, patient to have diuretics trial today, ongoing discussions with patient and her son together as a unit, to discuss her overall goals and wishes, PMT to continue to follow.   25 minutes spent.  Greater than 50%  of this time was spent counseling and coordinating care related to the above assessment and plan.  Loistine Chance MD [facility] [phone].

## 2018-12-29 NOTE — Plan of Care (Signed)

## 2018-12-29 NOTE — Progress Notes (Signed)
PT Cancellation Note  Patient Details Name: Kaitlyn Rodriguez MRN: [id number] DOB: [birth year]   Cancelled Treatment:    Reason Eval/Treat Not Completed: Patient declined, no reason specified. PT attempted to see patient twice for treatment, upon first attempt pt deferring PT until after she is able to eat. Upon 2nd attempt pt refusing PT 2/2 fatigue and weakness. PT educates pt on the need to mobilize with therapy to improve upon strength deficits, however pt continues to refuse. PT will attempt to follow up as time allows.   Zenaida Niece 12/29/2018, 3:48 PM

## 2018-12-29 NOTE — Progress Notes (Addendum)
Progress Note  Patient Name: Kaitlyn Rodriguez Date of Encounter: 12/29/2018  Primary Cardiologist: No primary care provider on file.   Subjective   Feeling well today, having some anxiety. Now on [place].   Inpatient Medications    Scheduled Meds: . sodium chloride   Intravenous Once  . amiodarone  400 mg Oral BID  . [START ON 12/30/2018] apixaban  5 mg Oral BID  . aspirin  81 mg Oral Daily  . bisacodyl  10 mg Oral Daily   Or  . bisacodyl  10 mg Rectal Daily  . Chlorhexidine Gluconate Cloth  6 each Topical Daily  . docusate sodium  200 mg Oral Daily  . furosemide  40 mg Intravenous BID  . levalbuterol  0.63 mg Nebulization TID  . levothyroxine  150 mcg Oral QAC breakfast  . mouth rinse  15 mL Mouth Rinse BID  . metolazone  5 mg Oral Once  . metoprolol tartrate  25 mg Per Tube BID   Or  . metoprolol tartrate  25 mg Oral BID  . pantoprazole  40 mg Oral Daily  . potassium chloride  20 mEq Oral Daily  . sodium chloride flush  3 mL Intravenous Q12H   Continuous Infusions: . sodium chloride    . furosemide (LASIX) infusion 8 mg/hr (12/29/18 0800)   PRN Meds: Place/Maintain arterial line **AND** sodium chloride, levalbuterol, metoprolol tartrate, naLOXone (NARCAN)  injection, ondansetron (ZOFRAN) IV, oxyCODONE   Vital Signs    Vitals:   12/29/18 0700 12/29/18 0737 12/29/18 0755 12/29/18 0800  BP: (!) 142/99  135/70 (!) 145/87  Pulse: 100 87 99 88  Resp: (!) 22 (!) 27 (!) 26 (!) 34  Temp:  98.2 F (36.8 C)    TempSrc:  Oral    SpO2: 100% 100% 99% 100%  Weight:      Height:        Intake/Output Summary (Last 24 hours) at 12/29/2018 0906 Last data filed at 12/29/2018 0800 Gross per 24 hour  Intake 187.86 ml  Output 2035 ml  Net -1847.14 ml   Filed Weights   12/27/18 0500 12/28/18 0500 12/29/18 0500  Weight: 105.9 kg 105.9 kg 107.5 kg    Telemetry    Atrial fibrillation - Personally Reviewed  ECG    10/28: atrial fibrillation with RVR - Personally Reviewed   Physical Exam   GEN: No acute distress.   HEENT: No JVD, [place]/AT, [place] in place Cardiac: irregular rate & rhythm, no murmurs, rubs, or gallops.  Respiratory: Clear to auscultation bilaterally. GI: Soft, nontender, non-distended  MS: +LE edema; No deformity. Neuro: a&o, following commands Psych: Normal affect   Labs    Chemistry Recent Labs  Lab 12/25/18 1640  12/26/18 1148  12/27/18 0342  12/28/18 0234  12/28/18 2129 12/29/18 0309 12/29/18 0757  NA 132*   < >  --    < > 134*   < > 136   < > 136 138 136  K 4.7   < >  --    < > 4.0   < > 4.1   < > 4.0 3.8 3.9  CL 96*   < >  --   --  98  --  98  --   --  99  --   CO2 27   < >  --   --  27  --  30  --   --  30  --   GLUCOSE 116*   < >  --   --  98  --  102*  --   --  95  --   BUN 23   < >  --   --  23  --  25*  --   --  22  --   CREATININE 0.95   < >  --   --  0.84  --  0.85  --   --  0.77  --   CALCIUM 8.3*   < >  --   --  8.3*  --  8.3*  --   --  8.5*  --   PROT 5.0*  --  5.2*  --   --   --  5.3*  --   --   --   --   ALBUMIN 2.3*  --  2.3*  --   --   --  2.4*  --   --   --   --   AST 17  --  19  --   --   --  13*  --   --   --   --   ALT 61*  --  50*  --   --   --  31  --   --   --   --   ALKPHOS 68  --  76  --   --   --  59  --   --   --   --   BILITOT 0.7  --  0.4  --   --   --  0.6  --   --   --   --   GFRNONAA 55*   < >  --   --  >60  --  >60  --   --  >60  --   GFRAA >60   < >  --   --  >60  --  >60  --   --  >60  --   ANIONGAP 9   < >  --   --  9  --  8  --   --  9  --    < > = values in this interval not displayed.     Hematology Recent Labs  Lab 12/27/18 0342  12/28/18 0234  12/28/18 2129 12/29/18 0309 12/29/18 0757  WBC 8.6  --  8.2  --   --  8.6  --   RBC 3.54*  --  3.38*  --   --  3.68*  --   HGB 8.7*   < > 8.3*   < > 9.5* 9.0* 10.5*  HCT 29.3*   < > 27.9*   < > 28.0* 30.2* 31.0*  MCV 82.8  --  82.5  --   --  82.1  --   MCH 24.6*  --  24.6*  --   --  24.5*  --   MCHC 29.7*  --  29.7*  --   --  29.8*  --    RDW 22.3*  --  22.1*  --   --  22.2*  --   PLT 297  --  367  --   --  437*  --    < > = values in this interval not displayed.    Cardiac EnzymesNo results for input(s): TROPONINI in the last 168 hours. No results for input(s): TROPIPOC in the last 168 hours.   BNPNo results for input(s): BNP, PROBNP in the last 168 hours.   DDimer No results for input(s): DDIMER in the last 168 hours.  Radiology    Ct Head Wo Contrast  Result Date: 12/28/2018 CLINICAL DATA:  Ataxia and altered mental status following repair of aortic dissection. Stroke suspected. EXAM: CT HEAD WITHOUT CONTRAST TECHNIQUE: Contiguous axial images were obtained from the base of the skull through the vertex without intravenous contrast. COMPARISON:  None. FINDINGS: Brain: Moderate generalized atrophy and white matter disease is present bilaterally. There is some asymmetry white matter disease on the left. No acute or focal cortical abnormality is present. Basal ganglia are intact. Insular ribbon is normal bilaterally. The ventricles are of normal size. No significant extraaxial fluid collection is present. Vascular: Atherosclerotic calcifications are present within the cavernous internal carotid arteries bilaterally. There is no hyperdense vessel. Skull: Calvarium is intact. No focal lytic or blastic lesions are present. Sinuses/Orbits: The paranasal sinuses and mastoid air cells are clear. The globes and orbits are within normal limits. IMPRESSION: 1. Extensive white matter hypoattenuation bilaterally. While this is likely chronic. White matter infarcts may be obscured. 2. No acute or focal cortical infarct. 3. Atherosclerosis. Electronically Signed   By: San Morelle M.D.   On: 12/28/2018 10:17   Dg Chest Port 1 View  Result Date: 12/29/2018 CLINICAL DATA:  Shortness of breath was respiratory failure EXAM: PORTABLE CHEST 1 VIEW COMPARISON:  December 28, 2018 FINDINGS: The lung volumes are low. The heart size remains  enlarged. There are small to moderate-sized bilateral pleural effusions with adjacent airspace opacities favored to represent atelectasis. There is no pneumothorax. Vascular congestion is noted. Skin staples are projected over the right upper chest wall. IMPRESSION: Stable appearance of the chest when compared to 1 day prior. Electronically Signed   By: Constance Holster M.D.   On: 12/29/2018 05:57   Dg Chest Port 1 View  Result Date: 12/28/2018 CLINICAL DATA:  Pleural effusion EXAM: PORTABLE CHEST 1 VIEW COMPARISON:  Chest x-rays dated 12/26/2018 and 12/25/2018. Chest CT dated 12/26/2018. FINDINGS: Stable cardiomegaly. Stable bibasilar opacities, compatible with the combination of atelectasis and small pleural effusions demonstrated on chest CT of 12/26/2018. No new lung findings. No pneumothorax is seen. Median sternotomy wires appear intact and stable in alignment. IMPRESSION: Stable chest x-ray. Stable bibasilar opacities, compatible with the combination of atelectasis and small pleural effusions demonstrated on chest CT of 10/30/202020. Electronically Signed   By: Franki Cabot M.D.   On: 12/28/2018 08:50    Cardiac Studies    ECHO 10/30 IMPRESSIONS   1. Left ventricular ejection fraction, by visual estimation, is 50 to 55%. The left ventricle has normal function. There is no left ventricular hypertrophy.  2. Global right ventricle has mildly reduced systolic function.The right ventricular size is mildly enlarged. No increase in right ventricular wall thickness.  3. Left atrial size was not assessed.  4. Right atrial size was not assessed.  5. Small pericardial effusion.  6. The mitral valve is grossly normal. No evidence of mitral valve regurgitation.  7. The tricuspid valve is grossly normal. Tricuspid valve regurgitation is mild.  8. The aortic valve is grossly normal. Aortic valve regurgitation is not visualized.  9. The pulmonic valve was not assessed. Pulmonic valve regurgitation is  not visualized. 10. Aortic root could not be assessed. 11. LVEF improved from the prior study, now 50-55% with paradoxical septal motion. 12. RVEF mildly decreased. 13. Small amount pf pericardial effusion with no signs of tamponade. 14. The interatrial septum was not assessed.  ECHO 10/26  1. Left ventricular ejection fraction, by visual estimation, is 35 to 40%. The  left ventricle has moderately decreased function. Normal left ventricular size. There is no left ventricular hypertrophy.  2. Left ventricular diastolic Doppler parameters are consistent with impaired relaxation pattern of LV diastolic filling.  3. Global right ventricle has normal systolic function.The right ventricular size is normal. No increase in right ventricular wall thickness.  4. Left atrial size was normal.  5. Right atrial size was normal.  6. Mild mitral annular calcification.  7. The mitral valve is grossly normal. Mild to moderate mitral valve regurgitation.  8. The tricuspid valve is normal in structure. Tricuspid valve regurgitation is trivial.  9. The aortic valve is tricuspid Aortic valve regurgitation is mild by color flow Doppler. Mild to moderate aortic valve sclerosis/calcification without any evidence of aortic stenosis. 10. The pulmonic valve was grossly normal. Pulmonic valve regurgitation is not visualized by color flow Doppler. 11. Mildly elevated pulmonary artery systolic pressure.  Patient Profile     85 y.o. female admitted with ascending aortic dissection, s/p repair 10/24  Assessment & Plan     1. aoritc dissection - she is s/p repair 10/24, doing well.  2. Respiratory failure - Improving. On [place] now.   3. Atrial fib - Elevated rate today, blood pressure improved to 130s systolic. Continue amiodarone 400 mg bid, increase metoprolol tartrate 25 mg to q8h. Starting eliquis tomorrow.  4. Hypotension - resolved 5. Volume overload - on lasix gtt and lasix 40 mg bid, given metolazone 5 mg today.  Appreciate nephrology recs.   Dispo: recently converted to DNI/DNR   For questions or updates, please contact [facility] Please consult [email or web address] for contact info under Cardiology/STEMI.      Signed, Versie Starks, DO  12/29/2018, 9:06 AM     Patient seen and examined. Agree with assessment and plan.  Patient is alert today.  She admits to being somewhat anxious.  Telemetry reveals A. fib with ventricular rate approximately 120 bpm despite being on amiodarone 400 mg twice a day and metoprolol tartrate 25 mg twice daily.  Pressure is stable.  Will slightly titrate metoprolol to 25 mg every 8 hours today for improve rate control.  The plan is to initiate Eliquis anticoagulation tomorrow after 1 week following her emergent surgery aortic dissection and cardiac tamponade.  She is now on Lasix was given a dose of metolazone earlier to induce increased urination.  Creatinine 0.77 and BUN 22 today.    Lennette Bihari, MD, [facility] 12/29/2018 12:09 PM

## 2018-12-29 NOTE — Progress Notes (Signed)
Pt. Refusing bipap again. Pt. Educated. 6 L Door  O2 99. Pt. Awake and alert

## 2018-12-30 DIAGNOSIS — J9602 Acute respiratory failure with hypercapnia: Secondary | ICD-10-CM

## 2018-12-30 DIAGNOSIS — Z515 Encounter for palliative care: Secondary | ICD-10-CM

## 2018-12-30 DIAGNOSIS — J9601 Acute respiratory failure with hypoxia: Secondary | ICD-10-CM

## 2018-12-30 LAB — GLUCOSE, CAPILLARY
Glucose-Capillary: 133 mg/dL — ABNORMAL HIGH (ref 70–99)
Glucose-Capillary: 120 mg/dL — ABNORMAL HIGH (ref 70–99)

## 2018-12-30 LAB — BASIC METABOLIC PANEL
Anion gap: 11 (ref 5–15)
Anion gap: 10 (ref 5–15)
BUN: 21 mg/dL (ref 8–23)
BUN: 22 mg/dL (ref 8–23)
CO2: 35 mmol/L — ABNORMAL HIGH (ref 22–32)
CO2: 38 mmol/L — ABNORMAL HIGH (ref 22–32)
Calcium: 8.6 mg/dL — ABNORMAL LOW (ref 8.9–10.3)
Calcium: 8.5 mg/dL — ABNORMAL LOW (ref 8.9–10.3)
Chloride: 91 mmol/L — ABNORMAL LOW (ref 98–111)
Chloride: 89 mmol/L — ABNORMAL LOW (ref 98–111)
Creatinine, Ser: 0.87 mg/dL (ref 0.44–1.00)
Creatinine, Ser: 1.01 mg/dL — ABNORMAL HIGH (ref 0.44–1.00)
GFR calc Af Amer: >60 mL/min (ref >60)
GFR calc Af Amer: 60 mL/min — ABNORMAL LOW (ref >60)
GFR calc non Af Amer: >60 mL/min (ref >60)
GFR calc non Af Amer: 51 mL/min — ABNORMAL LOW (ref >60)
Glucose, Bld: 100 mg/dL — ABNORMAL HIGH (ref 70–99)
Glucose, Bld: 114 mg/dL — ABNORMAL HIGH (ref 70–99)
Potassium: 3.1 mmol/L — ABNORMAL LOW (ref 3.5–5.1)
Potassium: 3.7 mmol/L (ref 3.5–5.1)
Sodium: 137 mmol/L (ref 135–145)
Sodium: 137 mmol/L (ref 135–145)

## 2018-12-30 LAB — CBC
HCT: 31 % — ABNORMAL LOW (ref 36.0–46.0)
Hemoglobin: 9.2 g/dL — ABNORMAL LOW (ref 12.0–15.0)
MCH: 24.1 pg — ABNORMAL LOW (ref 26.0–34.0)
MCHC: 29.7 g/dL — ABNORMAL LOW (ref 30.0–36.0)
MCV: 81.4 fL (ref 80.0–100.0)
Platelets: 561 10*3/uL — ABNORMAL HIGH (ref 150–400)
RBC: 3.81 MIL/uL — ABNORMAL LOW (ref 3.87–5.11)
RDW: 22.2 % — ABNORMAL HIGH (ref 11.5–15.5)
WBC: 10.4 10*3/uL (ref 4.0–10.5)
nRBC: 0 % (ref 0.0–0.2)

## 2018-12-30 MED ORDER — POTASSIUM CHLORIDE 20 MEQ/15ML (10%) PO SOLN
20.0000 meq | ORAL | Status: AC
  Administered 2018-12-30 (×3): 20 meq via ORAL
  Filled 2018-12-30 (×3): qty 15

## 2018-12-30 MED ORDER — ACETAZOLAMIDE SODIUM 500 MG IJ SOLR
250.0000 mg | Freq: Once | INTRAMUSCULAR | Status: AC
  Administered 2018-12-30: 11:00:00 250 mg via INTRAVENOUS
  Filled 2018-12-30: qty 250

## 2018-12-30 NOTE — Progress Notes (Signed)
PMT progress note  Patient awake and alert.  Interactive and appropriate.  States she slept better last night which has really helped her feel better.    Son at bedside and helping her eat lunch.  He reports swelling in hands much better today.  Review had 4.5L off since yesterday     BP 90/62   Pulse 98   Temp 97.9 F (36.6 C) (Oral)   Resp (!) 22   Ht 5\' 5"  (1.651 m)   Wt 96.5 kg   SpO2 98%   BMI 35.40 kg/m  Labs and imaging noted Chart reviewed, briefly discussed with bedside staff.  General: Alert, awake, in no acute distress. Chronically ill appearing. Heart: Irregular. No murmur appreciated. Lungs: Good air movement, clear Abdomen: Soft, nontender, nondistended, positive bowel sounds.  Ext: + edema Skin: Warm and dry Neuro: Grossly intact, nonfocal. PPS 40%  Ms. Seufert is a 85 year old lady with past medical history of atrial fibrillation, hypothyroidism.  She was admitted for aortic dissection and is status post repair    Postop, the patient has had acute hypercarbic respiratory failure.  She has required use of BiPAP.  She has had waxing and waning mental status and borderline blood pressures.  Ongoing discussions have previously been undertaken by primary team, critical care service with the patient and her son with regards to CODE STATUS, renal replacement, overall goals of care.  A palliative consultation has been requested for ongoing conversations of the above nature.  Plan: Continue current care.  Appears to be responding well to current diuresis.  Discussed plan for PMT follow-up later this week with further discussion if needed based on how she is doing clinically. Her son has my card and will call if I can be of assistance.  25 minutes spent.  Greater than 50%  of this time was spent counseling and coordinating care related to the above assessment and plan.  Micheline Rough, MD [place] Team [phone]

## 2018-12-30 NOTE — Progress Notes (Signed)
Kaitlyn Rodriguez  Assessment/ Plan: Pt is a 85 y.o. 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  Per [facility].  #Volume overload/pulmonary edema: Responding very well with IV Lasix and metolazone.  She had around 5 L of urine output in 24 hours.  Her renal function remains stable.  CO2 level has gone up due to contraction alkalosis.  I will order a dose of acetazolamide.  I will continue IV Lasix drip today and hopefully can switch to intermittent dosing from tomorrow.  Continue to monitor BMP, strict ins and out.  No need for dialysis.  #Hypokalemia due to diuretics.  Agree with potassium replacement.  Monitor lab.  #Hypercapnic respiratory failure: Due to fluid overload.Breathing status is improving.  Pulmonary team is following.  Diuretics as above.  # Anemia: Monitor CBC  #Goals of care: Seen by palliative care.  Patient is DNR however agreed for dialysis if needed.  Discussed with the primary team and nursing staff  Subjective: Seen and examined at bedside.  Good response with IV diuretics.  Robust urine output.  Clinically improving.  Denied chest pain, shortness of breath is improving.  Oxygen requirement is coming down.  Kidney function is stable.  Objective Vital signs in last 24 hours: Vitals:   12/30/18 0500 12/30/18 0600 12/30/18 0700 12/30/18 0750  BP: (!) 137/110 92/70 100/63 100/63  Pulse: 94 (!) 105 85 85  Resp: (!) 24 (!) 23 (!) 24 (!) 22  Temp:      TempSrc:      SpO2: 99% 99% 100% 100%  Weight: 96.5 kg     Height:       Weight change: -11 kg  Intake/Output Summary (Last 24 hours) at 12/30/2018 0807 Last data filed at 12/30/2018 0700 Gross per 24 hour  Intake 764.08 ml  Output 5102 ml  Net -4337.92 ml       Labs: Basic Metabolic Panel: Recent Labs  Lab  12/29/18 0309 12/29/18 0757 12/29/18 1659 12/30/18 0304  NA 138 136 136 137  K 3.8 3.9 3.1* 3.1*  CL 99  --  93* 91*  CO2 30  --  33* 35*  GLUCOSE 95  --  122* 100*  BUN 22  --  24* 21  CREATININE 0.77  --  0.82 0.87  CALCIUM 8.5*  --  8.4* 8.6*  PHOS  --   --  2.7  --    Liver Function Tests: Recent Labs  Lab 12/25/18 1640 12/26/18 1148 12/28/18 0234 12/29/18 1659  AST 17 19 13*  --   ALT 61* 50* 31  --   ALKPHOS 68 76 59  --   BILITOT 0.7 0.4 0.6  --   PROT 5.0* 5.2* 5.3*  --   ALBUMIN 2.3* 2.3* 2.4* 2.6*   No results for input(s): LIPASE, AMYLASE in the last 168 hours. Recent Labs  Lab 12/26/18 1148  AMMONIA 16   CBC: Recent Labs  Lab 12/24/18 0821  12/26/18 0244  12/27/18 0342  12/28/18 0234  12/29/18 0309 12/29/18 0757 12/30/18 0304  WBC 20.4*   < > 12.2*  --  8.6  --  8.2  --  8.6  --  10.4  NEUTROABS 19.0*  --   --   --   --   --   --   --   --   --   --  HGB 7.6*   < > 9.0*   < > 8.7*   < > 8.3*   < > 9.0* 10.5* 9.2*  HCT 25.6*   < > 30.4*   < > 29.3*   < > 27.9*   < > 30.2* 31.0* 31.0*  MCV 80.5   < > 82.2  --  82.8  --  82.5  --  82.1  --  81.4  PLT 155   < > 255  --  297  --  367  --  437*  --  561*   < > = values in this interval not displayed.   Cardiac Enzymes: No results for input(s): CKTOTAL, CKMB, CKMBINDEX, TROPONINI in the last 168 hours. CBG: Recent Labs  Lab 12/29/18 0745 12/29/18 1141 12/29/18 1556 12/29/18 2103 12/29/18 2351  GLUCAP 75 130* 100* 124* 103*    Iron Studies: No results for input(s): IRON, TIBC, TRANSFERRIN, FERRITIN in the last 72 hours. Studies/Results: Ct Head Wo Contrast  Result Date: 12/28/2018 CLINICAL DATA:  Ataxia and altered mental status following repair of aortic dissection. Stroke suspected. EXAM: CT HEAD WITHOUT CONTRAST TECHNIQUE: Contiguous axial images were obtained from the base of the skull through the vertex without intravenous contrast. COMPARISON:  None. FINDINGS: Brain: Moderate  generalized atrophy and white matter disease is present bilaterally. There is some asymmetry white matter disease on the left. No acute or focal cortical abnormality is present. Basal ganglia are intact. Insular ribbon is normal bilaterally. The ventricles are of normal size. No significant extraaxial fluid collection is present. Vascular: Atherosclerotic calcifications are present within the cavernous internal carotid arteries bilaterally. There is no hyperdense vessel. Skull: Calvarium is intact. No focal lytic or blastic lesions are present. Sinuses/Orbits: The paranasal sinuses and mastoid air cells are clear. The globes and orbits are within normal limits. IMPRESSION: 1. Extensive white matter hypoattenuation bilaterally. While this is likely chronic. White matter infarcts may be obscured. 2. No acute or focal cortical infarct. 3. Atherosclerosis. Electronically Signed   By: Marin Roberts M.D.   On: 12/28/2018 10:17   Dg Chest Port 1 View  Result Date: 12/29/2018 CLINICAL DATA:  Shortness of breath was respiratory failure EXAM: PORTABLE CHEST 1 VIEW COMPARISON:  December 28, 2018 FINDINGS: The lung volumes are low. The heart size remains enlarged. There are small to moderate-sized bilateral pleural effusions with adjacent airspace opacities favored to represent atelectasis. There is no pneumothorax. Vascular congestion is noted. Skin staples are projected over the right upper chest wall. IMPRESSION: Stable appearance of the chest when compared to 1 day prior. Electronically Signed   By: Katherine Mantle M.D.   On: 12/29/2018 05:57    Medications: Infusions: . sodium chloride    . furosemide (LASIX) infusion 8 mg/hr (12/30/18 0700)    Scheduled Medications: . sodium chloride   Intravenous Once  . amiodarone  400 mg Oral BID  . apixaban  5 mg Oral BID  . aspirin  81 mg Oral Daily  . bisacodyl  10 mg Oral Daily   Or  . bisacodyl  10 mg Rectal Daily  . Chlorhexidine Gluconate Cloth  6  each Topical Daily  . docusate sodium  200 mg Oral Daily  . levalbuterol  0.63 mg Nebulization TID  . levothyroxine  150 mcg Oral QAC breakfast  . mouth rinse  15 mL Mouth Rinse BID  . metoprolol tartrate  25 mg Per Tube Q8H   Or  . metoprolol tartrate  25 mg Oral Q8H  .  pantoprazole  40 mg Oral Daily  . potassium chloride  20 mEq Oral Q4H  . potassium chloride  20 mEq Oral Daily  . sodium chloride flush  3 mL Intravenous Q12H    have reviewed scheduled and prn medications.  Physical Exam: General:NAD, comfortable Heart:RRR, s1s2 nl Lungs: Basal decreased breath sound, no increased work of breathing Abdomen:soft, Non-tender, non-distended Extremities:No edema Neurology: Alert awake and following commands  Nadalee Neiswender Tanna Furry 12/30/2018,8:07 AM  LOS: 9 days  Pager: [phone]

## 2018-12-30 NOTE — Progress Notes (Addendum)
Progress Note  Patient Name: Kaitlyn Rodriguez Date of Encounter: 12/30/2018  Primary Cardiologist: No primary care provider on file.   Subjective   States she is bored and would like to walk around. Otherwise feeling ok.   Inpatient Medications    Scheduled Meds:  sodium chloride   Intravenous Once   amiodarone  400 mg Oral BID   apixaban  5 mg Oral BID   aspirin  81 mg Oral Daily   bisacodyl  10 mg Oral Daily   Or   bisacodyl  10 mg Rectal Daily   Chlorhexidine Gluconate Cloth  6 each Topical Daily   docusate sodium  200 mg Oral Daily   levalbuterol  0.63 mg Nebulization TID   levothyroxine  150 mcg Oral QAC breakfast   mouth rinse  15 mL Mouth Rinse BID   metoprolol tartrate  25 mg Per Tube Q8H   Or   metoprolol tartrate  25 mg Oral Q8H   pantoprazole  40 mg Oral Daily   potassium chloride  20 mEq Oral Q4H   potassium chloride  20 mEq Oral Daily   sodium chloride flush  3 mL Intravenous Q12H   Continuous Infusions:  sodium chloride     furosemide (LASIX) infusion 8 mg/hr (12/30/18 0500)   PRN Meds: Place/Maintain arterial line **AND** sodium chloride, levalbuterol, metoprolol tartrate, naLOXone (NARCAN)  injection, ondansetron (ZOFRAN) IV, oxyCODONE, QUEtiapine   Vital Signs    Vitals:   12/30/18 0200 12/30/18 0300 12/30/18 0400 12/30/18 0500  BP: (!) 93/58 106/64 94/60   Pulse: 86 99 87   Resp: (!) Temp:      TempSrc:      SpO2: 98% 93% 98%   Weight:    96.5 kg  Height:        Intake/Output Summary (Last 24 hours) at 12/30/2018 0703 Last data filed at 12/30/2018 0500 Gross per 24 hour  Intake 756.07 ml  Output 4977 ml  Net -4220.93 ml   Filed Weights   12/28/18 0500 12/29/18 0500 12/30/18 0500  Weight: 105.9 kg 107.5 kg 96.5 kg    Telemetry    Afib without RVR  - Personally Reviewed  ECG    None since 10/28  Physical Exam   GEN: No acute distress.   Neck: No JVD, AT, [place] Cardiac: irregula rate & rhythm, no  murmurs, rubs, or gallops.  Respiratory: Clear to auscultation bilaterally. GI: Soft, nontender, non-distended  MS: trace edema; No deformity. Neuro:  Nonfocal  Psych: Normal affect   Labs    Chemistry Recent Labs  Lab 12/25/18 1640  12/26/18 1148  12/28/18 0234  12/29/18 0309 12/29/18 0757 12/29/18 1659 12/30/18 0304  NA 132*   < >  --    < > 136   < > 138 136 136 137  K 4.7   < >  --    < > 4.1   < > 3.8 3.9 3.1* 3.1*  CL 96*   < >  --    < > 98  --  99  --  93* 91*  CO2 27   < >  --    < > 30  --  30  --  33* 35*  GLUCOSE 116*   < >  --    < > 102*  --  95  --  122* 100*  BUN 23   < >  --    < > 25*  --  22  --  24* 21  CREATININE 0.95   < >  --    < > 0.85  --  0.77  --  0.82 0.87  CALCIUM 8.3*   < >  --    < > 8.3*  --  8.5*  --  8.4* 8.6*  PROT 5.0*  --  5.2*  --  5.3*  --   --   --   --   --   ALBUMIN 2.3*  --  2.3*  --  2.4*  --   --   --  2.6*  --   AST 17  --  19  --  13*  --   --   --   --   --   ALT 61*  --  50*  --  31  --   --   --   --   --   ALKPHOS 68  --  76  --  59  --   --   --   --   --   BILITOT 0.7  --  0.4  --  0.6  --   --   --   --   --   GFRNONAA 55*   < >  --    < > >60  --  >60  --  >60 >60  GFRAA >60   < >  --    < > >60  --  >60  --  >60 >60  ANIONGAP 9   < >  --    < > 8  --  9  --  10 11   < > = values in this interval not displayed.     Hematology Recent Labs  Lab 12/28/18 0234  12/29/18 0309 12/29/18 0757 12/30/18 0304  WBC 8.2  --  8.6  --  10.4  RBC 3.38*  --  3.68*  --  3.81*  HGB 8.3*   < > 9.0* 10.5* 9.2*  HCT 27.9*   < > 30.2* 31.0* 31.0*  MCV 82.5  --  82.1  --  81.4  MCH 24.6*  --  24.5*  --  24.1*  MCHC 29.7*  --  29.8*  --  29.7*  RDW 22.1*  --  22.2*  --  22.2*  PLT 367  --  437*  --  561*   < > = values in this interval not displayed.    Cardiac EnzymesNo results for input(s): TROPONINI in the last 168 hours. No results for input(s): TROPIPOC in the last 168 hours.   BNPNo results for input(s): BNP, PROBNP in  the last 168 hours.   DDimer No results for input(s): DDIMER in the last 168 hours.   Radiology    Ct Head Wo Contrast  Result Date: 12/28/2018 CLINICAL DATA:  Ataxia and altered mental status following repair of aortic dissection. Stroke suspected. EXAM: CT HEAD WITHOUT CONTRAST TECHNIQUE: Contiguous axial images were obtained from the base of the skull through the vertex without intravenous contrast. COMPARISON:  None. FINDINGS: Brain: Moderate generalized atrophy and white matter disease is present bilaterally. There is some asymmetry white matter disease on the left. No acute or focal cortical abnormality is present. Basal ganglia are intact. Insular ribbon is normal bilaterally. The ventricles are of normal size. No significant extraaxial fluid collection is present. Vascular: Atherosclerotic calcifications are present within the cavernous internal carotid arteries bilaterally. There is no hyperdense vessel. Skull: Calvarium is intact. No focal lytic or  blastic lesions are present. Sinuses/Orbits: The paranasal sinuses and mastoid air cells are clear. The globes and orbits are within normal limits. IMPRESSION: 1. Extensive white matter hypoattenuation bilaterally. While this is likely chronic. White matter infarcts may be obscured. 2. No acute or focal cortical infarct. 3. Atherosclerosis. Electronically Signed   By: Marin Roberts M.D.   On: 12/28/2018 10:17   Dg Chest Port 1 View  Result Date: 12/29/2018 CLINICAL DATA:  Shortness of breath was respiratory failure EXAM: PORTABLE CHEST 1 VIEW COMPARISON:  December 28, 2018 FINDINGS: The lung volumes are low. The heart size remains enlarged. There are small to moderate-sized bilateral pleural effusions with adjacent airspace opacities favored to represent atelectasis. There is no pneumothorax. Vascular congestion is noted. Skin staples are projected over the right upper chest wall. IMPRESSION: Stable appearance of the chest when compared to 1  day prior. Electronically Signed   By: Katherine Mantle M.D.   On: 12/29/2018 05:57    Cardiac Studies   ECHO 10/30 IMPRESSIONS  1. Left ventricular ejection fraction, by visual estimation, is 50 to 55%. The left ventricle has normal function. There is no left ventricular hypertrophy. 2. Global right ventricle has mildly reduced systolic function.The right ventricular size is mildly enlarged. No increase in right ventricular wall thickness. 3. Left atrial size was not assessed. 4. Right atrial size was not assessed. 5. Small pericardial effusion. 6. The mitral valve is grossly normal. No evidence of mitral valve regurgitation. 7. The tricuspid valve is grossly normal. Tricuspid valve regurgitation is mild. 8. The aortic valve is grossly normal. Aortic valve regurgitation is not visualized. 9. The pulmonic valve was not assessed. Pulmonic valve regurgitation is not visualized. 10. Aortic root could not be assessed. 11. LVEF improved from the prior study, now 50-55% with paradoxical septal motion. 12. RVEF mildly decreased. 13. Small amount pf pericardial effusion with no signs of tamponade. 14. The interatrial septum was not assessed.  ECHO 10/26 1. Left ventricular ejection fraction, by visual estimation, is 35 to 40%. The left ventricle has moderately decreased function. Normal left ventricular size. There is no left ventricular hypertrophy. 2. Left ventricular diastolic Doppler parameters are consistent with impaired relaxation pattern of LV diastolic filling. 3. Global right ventricle has normal systolic function.The right ventricular size is normal. No increase in right ventricular wall thickness. 4. Left atrial size was normal. 5. Right atrial size was normal. 6. Mild mitral annular calcification. 7. The mitral valve is grossly normal. Mild to moderate mitral valve regurgitation. 8. The tricuspid valve is normal in structure. Tricuspid valve regurgitation is  trivial. 9. The aortic valve is tricuspid Aortic valve regurgitation is mild by color flow Doppler. Mild to moderate aortic valve sclerosis/calcification without any evidence of aortic stenosis. 10. The pulmonic valve was grossly normal. Pulmonic valve regurgitation is not visualized by color flow Doppler. 11. Mildly elevated pulmonary artery systolic pressure.  Patient Profile     85 y.o. 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 aoritc dissection - she is s/p repair 10/24, continuing to do well.  2. Respiratory failure - Improving. On [place] now.   3. Atrial fib - Rate improved with increased metoprolol to q8h yesterday, 25 mg. Cont amio 400 mg bid to total 10g. Eliquis restarted yesterday.  - repeat ECG today.  4. Hypotension - previously resolved, blood pressures soft today. MAP 70s, Map goal >65. 5. Volume overload - Nephrology consulted. Diuresed 4.5L yesterday but  net positive 10L. On lasix gtt, given diamox today for elevated bicarb of 35.     For questions or updates, please contact [place] Please consult [email or web address] for contact info under Cardiology/STEMI.      Signed, Marty Heck, DO  12/30/2018, 7:03 AM     Patient seen and examined. Agree with assessment and plan.  Feeling somewhat better today.  Excellent urine output yesterday with -5177 and net output of -4404.  Still +10,430 since admission.  A. fib rate is better controlled with ventricular rate now in the 90s on the increased metoprolol 25 mg every 8 hours in addition to amiodarone 40 mg twice a day.  As of yesterday, total amiodarone load was approximately 3.6 g.  Patient received 1 dose of acetazolamide today with CO2 at 35 and continues to be on IV Lasix.  Renal function stable, creatinine 0.87 today.   Troy Sine, MD, [facility] 12/30/2018 11:44 AM

## 2018-12-30 NOTE — Discharge Instructions (Signed)
Discharge Instructions:  1. You may shower, please wash incisions daily with soap and water and keep dry.  If you wish to cover wounds with dressing you may do so but please keep clean and change daily.  No tub baths or swimming until incisions have completely healed.  If your incisions become red or develop any drainage please call our office at [phone]  2. No Driving until cleared by Dr. Vickey Sages office and you are no longer using narcotic pain medications  3. Monitor your weight daily.. Please use the same scale and weigh at same time... If you gain 5-10 lbs in 48 hours with associated lower extremity swelling, please contact our office at [phone]  4. Fever of 101.5 for at least 24 hours with no source, please contact our office at [phone]  5. Activity- up as tolerated, please walk at least 3 times per day.  Avoid strenuous activity, no lifting, pushing, or pulling with your arms over 8-10 lbs for a minimum of 6 weeks  6. If any questions or concerns arise, please do not hesitate to contact our office at [phone]   Information on my medicine - ELIQUIS (apixaban)  This medication education was reviewed with me or my healthcare representative as part of my discharge preparation.  The pharmacist that spoke with me during my hospital stay was:  Roselie Awkward, RPH  Why was Eliquis prescribed for you? Eliquis was prescribed for you to reduce the risk of forming blood clots that can cause a stroke if you have a medical condition called atrial fibrillation (a type of irregular heartbeat) OR to reduce the risk of a blood clots forming after orthopedic surgery.  What do You need to know about Eliquis ? Take your Eliquis TWICE DAILY - one tablet in the morning and one tablet in the evening with or without food.  It would be best to take the doses about the same time each day.  If you have difficulty swallowing the tablet whole please discuss with your pharmacist how to take the  medication safely.  Take Eliquis exactly as prescribed by your doctor and DO NOT stop taking Eliquis without talking to the doctor who prescribed the medication.  Stopping may increase your risk of developing a new clot or stroke.  Refill your prescription before you run out.  After discharge, you should have regular check-up appointments with your healthcare provider that is prescribing your Eliquis.  In the future your dose may need to be changed if your kidney function or weight changes by a significant amount or as you get older.  What do you do if you miss a dose? If you miss a dose, take it as soon as you remember on the same day and resume taking twice daily.  Do not take more than one dose of ELIQUIS at the same time.  Important Safety Information A possible side effect of Eliquis is bleeding. You should call your healthcare provider right away if you experience any of the following: ? Bleeding from an injury or your nose that does not stop. ? Unusual colored urine (red or dark brown) or unusual colored stools (red or black). ? Unusual bruising for unknown reasons. ? A serious fall or if you hit your head (even if there is no bleeding).  Some medicines may interact with Eliquis and might increase your risk of bleeding or clotting while on Eliquis. To help avoid this, consult your healthcare provider or pharmacist prior to using any new prescription  or non-prescription medications, including herbals, vitamins, non-steroidal anti-inflammatory drugs (NSAIDs) and supplements.  This website has more information on Eliquis (apixaban): [email or web address].

## 2018-12-30 NOTE — Progress Notes (Signed)
Physical Therapy Treatment Patient Details Name: Kaitlyn Rodriguez MRN: [id number] DOB: [birth year] Today's Date: 12/30/2018    History of Present Illness Patient is an 85 y/o female admitted with 1 week of subtle symptoms of malaise and fatigue, shoulder pain which worsened on 12/20/2018.   Work-up suggested myocardial infarction and assessment showed large pericardial effusion.  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 Pt with worsening respiratory status on 10/29 requiring BiPap, transitioning to critical care on 10/30.    PT Comments    Pt tolerates treatment well although overall endurance remains limited. Pt continues to require heavy physical assistance to perform all functional mobility and maintain sternal precautions. Pt demonstrates improved activation of LE musculature during session and is more alert and a better participant in therapy. Pt will continue to benefit from acute PT POC to reduce falls risk and caregiver burden.   Follow Up Recommendations  SNF;Supervision/Assistance - 24 hour     Equipment Recommendations  Wheelchair (measurements PT);Hospital bed    Recommendations for Other Services       Precautions / Restrictions Precautions Precautions: Fall;Sternal Restrictions Weight Bearing Restrictions: No Other Position/Activity Restrictions: sternal precautions     Mobility  Bed Mobility Overal bed mobility: Needs Assistance Bed Mobility: Supine to Sit     Supine to sit: Max assist;HOB elevated        Transfers Overall transfer level: Needs assistance Equipment used: None Transfers: Sit to/from Stand Sit to Stand: Max assist;From elevated surface         General transfer comment: PT providing L knee block for stability  Ambulation/Gait Ambulation/Gait assistance: Max assist Gait Distance (Feet): 2 Feet Assistive device: None Gait Pattern/deviations:  Shuffle Gait velocity: reduced Gait velocity interpretation: <1.31 ft/sec, indicative of household ambulator General Gait Details: pt taking two small shuffling steps to R side toward head of bed   Stairs             Wheelchair Mobility    Modified Rankin (Stroke Patients Only)       Balance Overall balance assessment: Needs assistance Sitting-balance support: Bilateral upper extremity supported;Feet supported Sitting balance-Leahy Scale: Fair Sitting balance - Comments: minG   Standing balance support: Bilateral upper extremity supported Standing balance-Leahy Scale: Poor Standing balance comment: modA of 1 person with BUE support                            Cognition Arousal/Alertness: Awake/alert Behavior During Therapy: WFL for tasks assessed/performed Overall Cognitive Status: Impaired/Different from baseline Area of Impairment: Memory;Safety/judgement;Problem solving                     Memory: Decreased recall of precautions   Safety/Judgement: Decreased awareness of safety;Decreased awareness of deficits   Problem Solving: Slow processing;Decreased initiation;Requires verbal cues        Exercises General Exercises - Upper Extremity Shoulder Flexion: AROM;Both;5 reps(limited to 90 degrees) Elbow Flexion: AROM;Both;10 reps General Exercises - Lower Extremity Ankle Circles/Pumps: AROM;Both;20 reps Gluteal Sets: AROM;Both;10 reps Hip ABduction/ADduction: AROM;Both;5 reps Straight Leg Raises: AROM;Both;15 reps    General Comments General comments (skin integrity, edema, etc.): VSS throughout session, pt on 4L       Pertinent Vitals/Pain Pain Assessment: Faces Faces Pain Scale: Hurts a little bit Pain Location: incision Pain Descriptors / Indicators: Grimacing Pain Intervention(s): Limited activity within patient's tolerance    Home Living  Prior Function            PT Goals (current goals  can now be found in the care plan section) Acute Rehab PT Goals Patient Stated Goal: to return home Progress towards PT goals: Progressing toward goals    Frequency    Min 2X/week      PT Plan Current plan remains appropriate    Co-evaluation              AM-PAC PT "6 Clicks" Mobility   Outcome Measure  Help needed turning from your back to your side while in a flat bed without using bedrails?: A Lot Help needed moving from lying on your back to sitting on the side of a flat bed without using bedrails?: Total Help needed moving to and from a bed to a chair (including a wheelchair)?: Total Help needed standing up from a chair using your arms (e.g., wheelchair or bedside chair)?: Total Help needed to walk in hospital room?: Total Help needed climbing 3-5 steps with a railing? : Total 6 Click Score: 7    End of Session Equipment Utilized During Treatment: Gait belt;Oxygen Activity Tolerance: Patient tolerated treatment well Patient left: in bed;with call bell/phone within reach;with family/visitor present Nurse Communication: Mobility status PT Visit Diagnosis: Muscle weakness (generalized) (M62.81)     Time: 2197-5883 PT Time Calculation (min) (ACUTE ONLY): 23 min  Charges:  $Therapeutic Exercise: 8-22 mins $Therapeutic Activity: 8-22 mins                     Zenaida Niece, PT, DPT Acute Rehabilitation Pager: [phone]    Zenaida Niece 12/30/2018, 2:35 PM

## 2018-12-30 NOTE — Progress Notes (Signed)
RT NOTE: RT redressed patient's arterial line and replaced pressure bag. Arterial line is very positional but has good waveform and reading is correlating with cuff pressures. Vitals are stable. RT will continue to monitor.

## 2018-12-30 NOTE — Progress Notes (Signed)
NAME:  Kaitlyn Rodriguez, MRN:  [id number], DOB:  [birth year], LOS: 9 ADMISSION DATE:  12/20/2018, CONSULTATION DATE:  12/26/18 REFERRING MD:  Vickey Sages, CVTS, CHIEF COMPLAINT:  AMS/Hypercapnia   Brief History   85 yo F POD6 repair of type A aortic dissection. Hypercapnia with lethargy noted 10/29, placed on BiPAP in PM. Remains on BiPAP 10/30; PCCM consulted.  Past Medical History  Type A Aortic dissection, s/p repair  Atrial fibrillation Hypothyroidism  Prediabetes   Significant Hospital Events   10/24 admitted 10/25 OR for repair of type A dissection with CVTS. Post-op to ICU. Extubated in ICU  10/26 off pressors  10/27 oriented to name and place, not time or situation. Concerning for delirium vs sundowning  10/28 hypotensive and receiving albumin 10/29 in afib, did not sleep well. Continues to appear fluid overloaded. ABG with PCo2 66, placed on BiPAP. Overnight patient more alert and ate dinner. 10/30 somnolent and remains on BiPAP. PCCM consulted for evaluation  11/1 17 L +, Clearly states she does not want HD or intubation 11/2 several GOC discussions and code status changes. Final verdict seems to be DNR but OK to try HD.   Consults:  Cards CVTS PCCM Procedures:  10/25 repair of type A aortic dissection   Significant Diagnostic Tests:  10/24 widened mediastinuj  10/24 CTA> type A aortic dissection 12/28/2018 CT Head Extensive white matter hypoattenuation bilaterally. While this is likely chronic. White matter infarcts may be obscured. No acute or focal cortical infarct. Atherosclerosis. Micro Data:  10/24 SARS CoV2> neg 10/25 MRSA > neg   Antimicrobials:  None  Interim history/subjective:  No acute events overnight. Diuresing well with lasix infusion and metolazone. 4.5L negative yesterady  Objective   Blood pressure 100/63, pulse 85, temperature 98.1 F (36.7 C), temperature source Oral, resp. rate (!) 22, height 5\' 5"  (1.651 m), weight 96.5 kg, SpO2 100 %.       Intake/Output Summary (Last 24 hours) at 12/30/2018 0848 Last data filed at 12/30/2018 0800 Gross per 24 hour  Intake 764.08 ml  Output 5302 ml  Net -4537.92 ml   Filed Weights   12/28/18 0500 12/29/18 0500 12/30/18 0500  Weight: 105.9 kg 107.5 kg 96.5 kg    Examination:   General:  Elderly overweight female in NAD Neuro:  Alert, oriented HEENT:  [place]/AT, No JVD noted, PERRL Cardiovascular:  RRR, no MRG Lungs:  Diminished bases.  Abdomen:  Soft, non-distended, non-tender Musculoskeletal:  No acute deformity or ROM limitation Skin:  Intact, MMM  Resolved Hospital Problem list     Assessment & Plan:  Dissecting type A thoracic aortic aneurysm, s/p repair 10/25  P -per CVTS  Hypercarbic/Hypoxic respiratory failure (acute): In the setting of volume overload and pulmonary edema. Improved with nocturnal and PRN BiPAP. Remains 10L positive, but is improving with Lasix infusion. - Nocturnal and PRN BiPAP. Suspect she will be able to discontinue this once volume status is optimized  - Supplemental O2 for SpO2 goal 88-92% - Lasix infusion per primary/nephrology (4.5L negative yesterday) - Incentive spirometry - Does not want intubation or HD  Delirium:  Improved with BiPAP QHS and seroquel.   Goals of Care: Multiple discussions 11/2. Verdict as far as I can tell is DNR, DNI. No dialysis.    PCCM will sign off. Please reconsult if further need or for NIVM discharge planning.    Best practice:  Diet: NPO Pain/Anxiety/Delirium protocol (if indicated): NA VAP protocol (if indicated): na DVT prophylaxis: eliquis GI prophylaxis: protonix  Glucose control: moniotor Mobility: PT Code Status: Full Family Communication: Per Primary Disposition: Transfer to ICU   Labs   CBC: Recent Labs  Lab 12/24/18 0821  12/26/18 0244  12/27/18 0342  12/28/18 0234 12/28/18 0239 12/28/18 2129 12/29/18 0309 12/29/18 0757 12/30/18 0304  WBC 20.4*   < > 12.2*  --  8.6  --  8.2  --   --   8.6  --  10.4  NEUTROABS 19.0*  --   --   --   --   --   --   --   --   --   --   --   HGB 7.6*   < > 9.0*   < > 8.7*   < > 8.3* 8.8* 9.5* 9.0* 10.5* 9.2*  HCT 25.6*   < > 30.4*   < > 29.3*   < > 27.9* 26.0* 28.0* 30.2* 31.0* 31.0*  MCV 80.5   < > 82.2  --  82.8  --  82.5  --   --  82.1  --  81.4  PLT 155   < > 255  --  297  --  367  --   --  437*  --  561*   < > = values in this interval not displayed.    Basic Metabolic Panel: Recent Labs  Lab 12/27/18 0342  12/28/18 0234  12/28/18 2129 12/29/18 0309 12/29/18 0757 12/29/18 1659 12/30/18 0304  NA 134*   < > 136   < > 136 138 136 136 137  K 4.0   < > 4.1   < > 4.0 3.8 3.9 3.1* 3.1*  CL 98  --  98  --   --  99  --  93* 91*  CO2 27  --  30  --   --  30  --  33* 35*  GLUCOSE 98  --  102*  --   --  95  --  122* 100*  BUN 23  --  25*  --   --  22  --  24* 21  CREATININE 0.84  --  0.85  --   --  0.77  --  0.82 0.87  CALCIUM 8.3*  --  8.3*  --   --  8.5*  --  8.4* 8.6*  MG 2.0  --  2.0  --   --  1.9  --   --   --   PHOS  --   --   --   --   --   --   --  2.7  --    < > = values in this interval not displayed.   GFR: Estimated Creatinine Clearance: 56.3 mL/min (by C-G formula based on SCr of 0.87 mg/dL). Recent Labs  Lab 12/27/18 0342 12/28/18 0234 12/29/18 0309 12/30/18 0304  WBC 8.6 8.2 8.6 10.4    Liver Function Tests: Recent Labs  Lab 12/25/18 1640 12/26/18 1148 12/28/18 0234 12/29/18 1659  AST 17 19 13*  --   ALT 61* 50* 31  --   ALKPHOS 68 76 59  --   BILITOT 0.7 0.4 0.6  --   PROT 5.0* 5.2* 5.3*  --   ALBUMIN 2.3* 2.3* 2.4* 2.6*   No results for input(s): LIPASE, AMYLASE in the last 168 hours. Recent Labs  Lab 12/26/18 1148  AMMONIA 16    ABG    Component Value Date/Time   PHART 7.350 12/29/2018 0757   PCO2ART 56.6 (H) 12/29/2018  0757   PO2ART 91.0 12/29/2018 0757   HCO3 31.3 (H) 12/29/2018 0757   TCO2 33 (H) 12/29/2018 0757   ACIDBASEDEF 1.0 12/21/2018 1214   O2SAT 96.0 12/29/2018 0757      Coagulation Profile: No results for input(s): INR, PROTIME in the last 168 hours.  Cardiac Enzymes: No results for input(s): CKTOTAL, CKMB, CKMBINDEX, TROPONINI in the last 168 hours.  HbA1C: Hgb A1c MFr Bld  Date/Time Value Ref Range Status  12/22/2018 04:07 AM 6.3 (H) 4.8 - 5.6 % Final    Comment:    (NOTE) Pre diabetes:          5.7%-6.4% Diabetes:              >6.4% Glycemic control for   <7.0% adults with diabetes     CBG: Recent Labs  Lab 12/29/18 1141 12/29/18 1556 12/29/18 2103 12/29/18 2351 12/30/18 0826  GLUCAP 130* 100* 124* 103* 133*    Review of Systems:   Unable to obtain from patient due to acute delirium   Past Medical History  She,  has a past medical history of Thyroid disease.   Surgical History    Past Surgical History:  Procedure Laterality Date  . ABDOMINAL HYSTERECTOMY     at age 85  . REPLACEMENT ASCENDING AORTA N/A 12/20/2018   Procedure: REPAIR OF TYPE A AORTIC DISSECTION USING HEMASHIELD PLATINUM 28MM AND 32MM GRAFT;  Surgeon: Linden DolinAtkins, Broadus Z, MD;  Location: [facility] OR;  Service: Open Heart Surgery;  Laterality: N/A;  . STERNAL CLOSURE  12/20/2018   Procedure: STERNAL PLATING;  Surgeon: Linden DolinAtkins, Broadus Z, MD;  Location: [facility] OR;  Service: Open Heart Surgery;;     Social History   reports that she has never smoked. She has never used smokeless tobacco. She reports current alcohol use. She reports that she does not use drugs.   Family History   Her family history is not on file.   Allergies No Known Allergies   Home Medications  Prior to Admission medications   Medication Sig Start Date End Date Taking? Authorizing Provider  levothyroxine (SYNTHROID) 150 MCG tablet Take 150 mcg by mouth daily before breakfast.   Yes [provider]      Joneen RoachPaul Jaryn Rosko, AGACNP-BC [facility] Pager [phone] or [phone]  12/30/2018 8:48 AM

## 2018-12-31 LAB — GLUCOSE, CAPILLARY
Glucose-Capillary: 107 mg/dL — ABNORMAL HIGH (ref 70–99)
Glucose-Capillary: 114 mg/dL — ABNORMAL HIGH (ref 70–99)

## 2018-12-31 LAB — BASIC METABOLIC PANEL
Anion gap: 13 (ref 5–15)
BUN: 24 mg/dL — ABNORMAL HIGH (ref 8–23)
CO2: 36 mmol/L — ABNORMAL HIGH (ref 22–32)
Calcium: 8.6 mg/dL — ABNORMAL LOW (ref 8.9–10.3)
Chloride: 87 mmol/L — ABNORMAL LOW (ref 98–111)
Creatinine, Ser: 1.02 mg/dL — ABNORMAL HIGH (ref 0.44–1.00)
GFR calc Af Amer: 59 mL/min — ABNORMAL LOW (ref >60)
GFR calc non Af Amer: 51 mL/min — ABNORMAL LOW (ref >60)
Glucose, Bld: 102 mg/dL — ABNORMAL HIGH (ref 70–99)
Potassium: 3.5 mmol/L (ref 3.5–5.1)
Sodium: 136 mmol/L (ref 135–145)

## 2018-12-31 LAB — CBC
HCT: 31.9 % — ABNORMAL LOW (ref 36.0–46.0)
Hemoglobin: 9.6 g/dL — ABNORMAL LOW (ref 12.0–15.0)
MCH: 24.1 pg — ABNORMAL LOW (ref 26.0–34.0)
MCHC: 30.1 g/dL (ref 30.0–36.0)
MCV: 79.9 fL — ABNORMAL LOW (ref 80.0–100.0)
Platelets: 605 10*3/uL — ABNORMAL HIGH (ref 150–400)
RBC: 3.99 MIL/uL (ref 3.87–5.11)
RDW: 22.4 % — ABNORMAL HIGH (ref 11.5–15.5)
WBC: 10.6 10*3/uL — ABNORMAL HIGH (ref 4.0–10.5)
nRBC: 0 % (ref 0.0–0.2)

## 2018-12-31 LAB — MAGNESIUM: Magnesium: 1.5 mg/dL — ABNORMAL LOW (ref 1.7–2.4)

## 2018-12-31 MED ORDER — MAGNESIUM SULFATE 4 GM/100ML IV SOLN
4.0000 g | Freq: Once | INTRAVENOUS | Status: AC
  Administered 2018-12-31: 09:00:00 4 g via INTRAVENOUS
  Filled 2018-12-31: qty 100

## 2018-12-31 MED ORDER — FUROSEMIDE 10 MG/ML IJ SOLN: 80.0000 mg | Freq: Two times a day (BID) | INTRAMUSCULAR | Status: DC

## 2018-12-31 MED ORDER — METOPROLOL TARTRATE 25 MG PO TABS
25.0000 mg | ORAL_TABLET | Freq: Two times a day (BID) | ORAL | Status: DC
  Administered 2019-01-01 – 2019-01-16 (×28): 25 mg via ORAL
  Filled 2018-12-31 (×32): qty 1

## 2018-12-31 MED ORDER — POTASSIUM CHLORIDE 20 MEQ/15ML (10%) PO SOLN
20.0000 meq | ORAL | Status: AC
  Administered 2018-12-31 (×3): 20 meq via ORAL
  Filled 2018-12-31 (×3): qty 15

## 2018-12-31 MED ORDER — CHLORHEXIDINE GLUCONATE 0.12 % MT SOLN
OROMUCOSAL | Status: AC
  Administered 2018-12-31: 22:00:00 15 mL
  Filled 2018-12-31: qty 15

## 2018-12-31 MED ORDER — METOPROLOL TARTRATE 25 MG/10 ML ORAL SUSPENSION
25.0000 mg | Freq: Two times a day (BID) | ORAL | Status: DC
  Administered 2019-01-05 – 2019-01-06 (×3): 25 mg
  Filled 2018-12-31 (×29): qty 10

## 2018-12-31 MED ORDER — ENSURE ENLIVE PO LIQD
237.0000 mL | Freq: Two times a day (BID) | ORAL | Status: DC
  Administered 2018-12-31 – 2019-01-16 (×29): 237 mL via ORAL

## 2018-12-31 MED ORDER — ADULT MULTIVITAMIN W/MINERALS CH
1.0000 | ORAL_TABLET | Freq: Every day | ORAL | Status: DC
  Administered 2018-12-31 – 2019-01-16 (×17): 1 via ORAL
  Filled 2018-12-31 (×17): qty 1

## 2018-12-31 MED ORDER — FUROSEMIDE 10 MG/ML IJ SOLN
20.0000 mg | Freq: Once | INTRAMUSCULAR | Status: AC
  Administered 2018-12-31: 20 mg via INTRAVENOUS
  Filled 2018-12-31: qty 2

## 2018-12-31 MED ORDER — LEVALBUTEROL HCL 0.63 MG/3ML IN NEBU
0.6300 mg | INHALATION_SOLUTION | Freq: Two times a day (BID) | RESPIRATORY_TRACT | Status: DC
  Administered 2018-12-31 – 2019-01-04 (×8): 0.63 mg via RESPIRATORY_TRACT
  Filled 2018-12-31 (×10): qty 3

## 2018-12-31 MED ORDER — FUROSEMIDE 10 MG/ML IJ SOLN
40.0000 mg | Freq: Three times a day (TID) | INTRAMUSCULAR | Status: DC
  Administered 2018-12-31: 09:00:00 40 mg via INTRAVENOUS
  Filled 2018-12-31: qty 4

## 2018-12-31 MED ORDER — ACETAZOLAMIDE 250 MG PO TABS
250.0000 mg | ORAL_TABLET | Freq: Once | ORAL | Status: AC
  Administered 2018-12-31: 11:00:00 250 mg via ORAL
  Filled 2018-12-31: qty 1

## 2018-12-31 MED ORDER — POTASSIUM CHLORIDE 20 MEQ/15ML (10%) PO SOLN
20.0000 meq | Freq: Every day | ORAL | Status: DC
  Administered 2018-12-31 – 2019-01-01 (×2): 20 meq via ORAL
  Filled 2018-12-31 (×2): qty 15

## 2018-12-31 NOTE — Plan of Care (Signed)
Afebrile, neuro intact Anxious at times and per pt worsens when son leaves her side. Was placed on biPap this afternoon for SOB, then transitioned back [place] late afternoon. Good appetite. Lasix gtt stopped and transitioned to IVP lasix. Dose given this AM but afternoon dose held due to low BP. Metoprolol also head due to not meeting ordered parameters, Dr Claiborne Billings called and made aware, ok per MD to hold doses. UO decreased this afternoon post lasix dose. Good appetite, eating with assistance.    Problem: Respiratory: Goal: Respiratory status will improve Outcome: Progressing   Problem: Coping: Goal: Level of anxiety will decrease Outcome: Progressing   Problem: Safety: Goal: Ability to remain free from injury will improve Outcome: Progressing

## 2018-12-31 NOTE — Progress Notes (Signed)
Pt placed on BiPAP due to her feeling short of breath. Pt tolerating settings well at this time and says she is comfortable. RT will continue to monitor pt status.

## 2018-12-31 NOTE — Progress Notes (Signed)
Initial Nutrition Assessment  DOCUMENTATION CODES:   Obesity unspecified  INTERVENTION:    Ensure Enlive po BID, each supplement provides 350 kcal and 20 grams of protein  MVI daily  NUTRITION DIAGNOSIS:   Increased nutrient needs related to post-op healing as evidenced by estimated needs.  GOAL:   Patient will meet greater than or equal to 90% of their needs  MONITOR:   Supplement acceptance, Weight trends, Labs, I & O's, PO intake, Skin  REASON FOR ASSESSMENT:   LOS    ASSESSMENT:   Patient with PMH significant for a.fib and hypothyroidism. Presents this admission with Type A aortic dissection.   10/25- s/p repair type A dissection   Pt discussed during ICU rounds and with RN.   Pt endorses loss of appetite a few days PTA. Typically eats three meals daily that consist of good protein sources. PTA she found herself finishing 50% of each meal. Does not use supplementation at home. Discussed the importance of protein intake to promote post op healing. Pt willing to try Ensure. Meal intake variable this admission at 25-100% for her last eight meals. Per pt intake has progressed significantly over the last two days.   Pt unsure of UBW and denies unintentional wt loss. Records lack history over the last year.    I/O: +7,745 ml since admit UOP: 3,455 ml x 24 hrs    Medications: dulcolax, colace, 40 mg lasix TID, MVI with minerals, 20 mEq KCl daily Labs: Mg 1.5 (L)   NUTRITION - FOCUSED PHYSICAL EXAM:    Most Recent Value  Orbital Region  No depletion  Upper Arm Region  No depletion  Thoracic and Lumbar Region  No depletion  Buccal Region  No depletion  Temple Region  No depletion  Clavicle Bone Region  No depletion  Clavicle and Acromion Bone Region  No depletion  Scapular Bone Region  No depletion  Dorsal Hand  No depletion  Patellar Region  No depletion  Anterior Thigh Region  No depletion  Posterior Calf Region  No depletion  Edema (RD Assessment)  Mild   Hair  Reviewed  Eyes  Reviewed  Mouth  Reviewed  Skin  Reviewed  Nails  Reviewed     Diet Order:   Diet Order            Diet heart healthy/carb modified Room service appropriate? Yes; Fluid consistency: Nectar Thick  Diet effective now              EDUCATION NEEDS:   Education needs have been addressed  Skin:  Skin Assessment: Skin Integrity Issues: Skin Integrity Issues:: Incisions Incisions: chest  Last BM:  11/3  Height:   Ht Readings from Last 1 Encounters:  12/21/18 5\' 5"  (1.651 m)    Weight:   Wt Readings from Last 1 Encounters:  12/31/18 93.7 kg    Ideal Body Weight:  56.8 kg  BMI:  Body mass index is 34.38 kg/m.  Estimated Nutritional Needs:   Kcal:  1700-1900 kcal  Protein:  85-100 grams  Fluid:  >/= 1.7 L/day   Mariana Single RD, LDN Clinical Nutrition Pager # - [phone]

## 2018-12-31 NOTE — Progress Notes (Signed)
Pt off of the BiPAP at this time. Machine is on stand by. Pt on 4L [place] tolerating well at this time. RT will continue to monitor.

## 2018-12-31 NOTE — Progress Notes (Addendum)
[facility] NEPHROLOGY PROGRESS NOTE  Assessment/ Plan: Pt is a 85 y.o. 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  We were consulted to evaluate for dialysis need.  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  Per Psychologist, sport and exercise.  #Volume overload/pulmonary edema: Responding very well with IV Lasix and metolazone.  She had robust urine output with significant clinical improvement.  Serum creatinine level elevated to 1 from 0.8.  Received a dose of acetazolamide yesterday.   I will discontinue Lasix drip and recommend to start intermittent Lasix dosing 80 mg twice a day.  She does not need renal replacement therapy especially since the volume status is managed by diuretics.  Cardiology is managing diuretics.  Continue to monitor BMP, strict ins and out.   #Hypokalemia due to diuretics.  Agree with potassium replacement.  Monitor lab.  #Hypercapnic respiratory failure: Due to fluid overload.Breathing status is improving.  Pulmonary team is following.  Diuretics as above.  # Anemia: Monitor CBC  #Goals of care: Seen by palliative care.  Patient is DNR however agreed for dialysis if needed.  I will sign off, please call back with question.  Discussed with Dr. Claiborne Billings from cardiology.  Subjective: Seen and examined at bedside.  Sitting on chair comfortable.  Urine output of 3.4 L in 24 hours.  Respiratory status is improving.  Denies nausea vomiting chest pain.  Objective Vital signs in last 24 hours: Vitals:   12/31/18 0600 12/31/18 0700 12/31/18 0747 12/31/18 0822  BP: 93/66 (!) 85/67    Pulse: (!) 138 93    Resp: (!) 23 (!) 24    Temp:    97.7 F (36.5 C)  TempSrc:    Oral  SpO2: (!) 63% (!) 73% 96%   Weight:      Height:       Weight change: -2.8 kg  Intake/Output Summary (Last 24 hours) at 12/31/2018 0830 Last data filed at 12/31/2018 0700 Gross per 24 hour   Intake 672.06 ml  Output 3255 ml  Net -2582.94 ml       Labs: Basic Metabolic Panel: Recent Labs  Lab 12/29/18 1659 12/30/18 0304 12/30/18 1500 12/31/18 0122  NA 136 137 137 136  K 3.1* 3.1* 3.7 3.5  CL 93* 91* 89* 87*  CO2 33* 35* 38* 36*  GLUCOSE 122* 100* 114* 102*  BUN 24* 21 22 24*  CREATININE 0.82 0.87 1.01* 1.02*  CALCIUM 8.4* 8.6* 8.5* 8.6*  PHOS 2.7  --   --   --    Liver Function Tests: Recent Labs  Lab 12/25/18 1640 12/26/18 1148 12/28/18 0234 12/29/18 1659  AST 17 19 13*  --   ALT 61* 50* 31  --   ALKPHOS 68 76 59  --   BILITOT 0.7 0.4 0.6  --   PROT 5.0* 5.2* 5.3*  --   ALBUMIN 2.3* 2.3* 2.4* 2.6*   No results for input(s): LIPASE, AMYLASE in the last 168 hours. Recent Labs  Lab 12/26/18 1148  AMMONIA 16   CBC: Recent Labs  Lab 12/27/18 0342  12/28/18 0234  12/29/18 0309 12/29/18 0757 12/30/18 0304 12/31/18 0122  WBC 8.6  --  8.2  --  8.6  --  10.4 10.6*  HGB 8.7*   < > 8.3*   < > 9.0* 10.5* 9.2* 9.6*  HCT 29.3*   < > 27.9*   < > 30.2* 31.0* 31.0* 31.9*  MCV 82.8  --  82.5  --  82.1  --  81.4 79.9*  PLT 297  --  367  --  437*  --  561* 605*   < > = values in this interval not displayed.   Cardiac Enzymes: No results for input(s): CKTOTAL, CKMB, CKMBINDEX, TROPONINI in the last 168 hours. CBG: Recent Labs  Lab 12/29/18 1556 12/29/18 2103 12/29/18 2351 12/30/18 0826 12/30/18 1202  GLUCAP 100* 124* 103* 133* 120*    Iron Studies: No results for input(s): IRON, TIBC, TRANSFERRIN, FERRITIN in the last 72 hours. Studies/Results: No results found.  Medications: Infusions: . sodium chloride    . magnesium sulfate bolus IVPB      Scheduled Medications: . sodium chloride   Intravenous Once  . amiodarone  400 mg Oral BID  . apixaban  5 mg Oral BID  . aspirin  81 mg Oral Daily  . bisacodyl  10 mg Oral Daily   Or  . bisacodyl  10 mg Rectal Daily  . Chlorhexidine Gluconate Cloth  6 each Topical Daily  . docusate sodium   200 mg Oral Daily  . furosemide  80 mg Intravenous BID  . levalbuterol  0.63 mg Nebulization BID  . levothyroxine  150 mcg Oral QAC breakfast  . mouth rinse  15 mL Mouth Rinse BID  . metoprolol tartrate  25 mg Per Tube Q8H   Or  . metoprolol tartrate  25 mg Oral Q8H  . pantoprazole  40 mg Oral Daily  . potassium chloride  20 mEq Oral Q4H  . potassium chloride  20 mEq Oral Daily  . sodium chloride flush  3 mL Intravenous Q12H    have reviewed scheduled and prn medications.  Physical Exam: General:NAD, comfortable, sitting on chair comfortable Heart:RRR, s1s2 nl Lungs: Clear bilateral, no wheezing.  No increased work of breathing Abdomen:soft, Non-tender, non-distended Extremities:No edema Neurology: Alert awake and following commands  Kaitlyn Rodriguez Kaitlyn Rodriguez 12/31/2018,8:30 AM  LOS: 10 days  Pager: [phone]

## 2018-12-31 NOTE — Plan of Care (Signed)

## 2018-12-31 NOTE — Progress Notes (Addendum)
Progress Note  Patient Name: Kaitlyn Rodriguez Date of Encounter: 12/31/2018  Primary Cardiologist: No primary care provider on file.   Subjective   Feeling well, no SOB, CP or other concerns.   Inpatient Medications    Scheduled Meds:  sodium chloride   Intravenous Once   amiodarone  400 mg Oral BID   apixaban  5 mg Oral BID   aspirin  81 mg Oral Daily   bisacodyl  10 mg Oral Daily   Or   bisacodyl  10 mg Rectal Daily   Chlorhexidine Gluconate Cloth  6 each Topical Daily   docusate sodium  200 mg Oral Daily   levalbuterol  0.63 mg Nebulization TID   levothyroxine  150 mcg Oral QAC breakfast   mouth rinse  15 mL Mouth Rinse BID   metoprolol tartrate  25 mg Per Tube Q8H   Or   metoprolol tartrate  25 mg Oral Q8H   pantoprazole  40 mg Oral Daily   potassium chloride  20 mEq Oral Q4H   potassium chloride  20 mEq Oral Daily   sodium chloride flush  3 mL Intravenous Q12H   Continuous Infusions:  sodium chloride     furosemide (LASIX) infusion 8 mg/hr (12/31/18 0600)   PRN Meds: Place/Maintain arterial line **AND** sodium chloride, levalbuterol, metoprolol tartrate, naLOXone (NARCAN)  injection, ondansetron (ZOFRAN) IV, oxyCODONE, QUEtiapine   Vital Signs    Vitals:   12/31/18 0300 12/31/18 0400 12/31/18 0500 12/31/18 0600  BP: 102/84  (!) 132/103 93/66  Pulse: 80 89  (!) 138  Resp: (!) 22 (!) 21 16 (!) 23  Temp:      TempSrc:      SpO2: 100% 98%  (!) 63%  Weight:   93.7 kg   Height:        Intake/Output Summary (Last 24 hours) at 12/31/2018 [id number] Last data filed at 12/31/2018 0600 Gross per 24 hour  Intake 904.06 ml  Output 3425 ml  Net -2520.94 ml   Filed Weights   12/29/18 0500 12/30/18 0500 12/31/18 0500  Weight: 107.5 kg 96.5 kg 93.7 kg    Telemetry    Atrial fibrillation - Personally Reviewed  ECG    11/4 ECG - atrial fibrillation with resolution of RVR - Personally Reviewed  Physical Exam   GEN: No acute distress.   Neck:  No JVD, AT, [place] in place Cardiac: irregular rate & rhythm, no murmurs, rubs, or gallops.  Respiratory: Clear to auscultation bilaterally. GI: Soft, nontender, non-distended  MS: No edema; No deformity. Neuro:  Nonfocal  Psych: Normal affect   Labs    Chemistry Recent Labs  Lab 12/25/18 1640  12/26/18 1148  12/28/18 0234  12/29/18 1659 12/30/18 0304 12/30/18 1500 12/31/18 0122  NA 132*   < >  --    < > 136   < > 136 137 137 136  K 4.7   < >  --    < > 4.1   < > 3.1* 3.1* 3.7 3.5  CL 96*   < >  --    < > 98   < > 93* 91* 89* 87*  CO2 27   < >  --    < > 30   < > 33* 35* 38* 36*  GLUCOSE 116*   < >  --    < > 102*   < > 122* 100* 114* 102*  BUN 23   < >  --    < >  25*   < > 24* 21 22 24*  CREATININE 0.95   < >  --    < > 0.85   < > 0.82 0.87 1.01* 1.02*  CALCIUM 8.3*   < >  --    < > 8.3*   < > 8.4* 8.6* 8.5* 8.6*  PROT 5.0*  --  5.2*  --  5.3*  --   --   --   --   --   ALBUMIN 2.3*  --  2.3*  --  2.4*  --  2.6*  --   --   --   AST 17  --  19  --  13*  --   --   --   --   --   ALT 61*  --  50*  --  31  --   --   --   --   --   ALKPHOS 68  --  76  --  59  --   --   --   --   --   BILITOT 0.7  --  0.4  --  0.6  --   --   --   --   --   GFRNONAA 55*   < >  --    < > >60   < > >60 >60 51* 51*  GFRAA >60   < >  --    < > >60   < > >60 >60 60* 59*  ANIONGAP 9   < >  --    < > 8   < > 10 11 10 13    < > = values in this interval not displayed.     Hematology Recent Labs  Lab 12/29/18 0309 12/29/18 0757 12/30/18 0304 12/31/18 0122  WBC 8.6  --  10.4 10.6*  RBC 3.68*  --  3.81* 3.99  HGB 9.0* 10.5* 9.2* 9.6*  HCT 30.2* 31.0* 31.0* 31.9*  MCV 82.1  --  81.4 79.9*  MCH 24.5*  --  24.1* 24.1*  MCHC 29.8*  --  29.7* 30.1  RDW 22.2*  --  22.2* 22.4*  PLT 437*  --  561* 605*    Cardiac EnzymesNo results for input(s): TROPONINI in the last 168 hours. No results for input(s): TROPIPOC in the last 168 hours.   BNPNo results for input(s): BNP, PROBNP in the last 168 hours.    DDimer No results for input(s): DDIMER in the last 168 hours.   Radiology    No results found.  Cardiac Studies   ECHO 10/30 IMPRESSIONS  1. Left ventricular ejection fraction, by visual estimation, is 50 to 55%. The left ventricle has normal function. There is no left ventricular hypertrophy. 2. Global right ventricle has mildly reduced systolic function.The right ventricular size is mildly enlarged. No increase in right ventricular wall thickness. 3. Left atrial size was not assessed. 4. Right atrial size was not assessed. 5. Small pericardial effusion. 6. The mitral valve is grossly normal. No evidence of mitral valve regurgitation. 7. The tricuspid valve is grossly normal. Tricuspid valve regurgitation is mild. 8. The aortic valve is grossly normal. Aortic valve regurgitation is not visualized. 9. The pulmonic valve was not assessed. Pulmonic valve regurgitation is not visualized. 10. Aortic root could not be assessed. 11. LVEF improved from the prior study, now 50-55% with paradoxical septal motion. 12. RVEF mildly decreased. 13. Small amount pf pericardial effusion with no signs of tamponade. 14. The interatrial septum was not assessed.  ECHO 10/26 1. Left ventricular ejection fraction, by visual estimation, is 35 to 40%. The left ventricle has moderately decreased function. Normal left ventricular size. There is no left ventricular hypertrophy. 2. Left ventricular diastolic Doppler parameters are consistent with impaired relaxation pattern of LV diastolic filling. 3. Global right ventricle has normal systolic function.The right ventricular size is normal. No increase in right ventricular wall thickness. 4. Left atrial size was normal. 5. Right atrial size was normal. 6. Mild mitral annular calcification. 7. The mitral valve is grossly normal. Mild to moderate mitral valve regurgitation. 8. The tricuspid valve is normal in structure. Tricuspid valve  regurgitation is trivial. 9. The aortic valve is tricuspid Aortic valve regurgitation is mild by color flow Doppler. Mild to moderate aortic valve sclerosis/calcification without any evidence of aortic stenosis. 10. The pulmonic valve was grossly normal. Pulmonic valve regurgitation is not visualized by color flow Doppler. 11. Mildly elevated pulmonary artery systolic pressure.  Patient Profile     85 y.o. 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 aoritc dissection - she is s/p repair10/24, continuing to do well.  2. Respiratory failure -resolved, now on Gross.  . 3. Atrial fib -Rate improved with increased metoprolol to q8h, 25 mg. Cont amio 400 mg bid to total 10g. On eliquis.   4. Hypotension -Blood pressures continue to be soft. We are having to balance between controlling her afib and blood pressure. MAP 70s, Map goal >65.  5. Volume overload - Nephrology consulted. Diuresing well. Nephro switching from lasix gtt to 40 mg IV tid.  - add diamox  250 mg po once for alkalosis  - repleting mag  For questions or updates, please contact [facility] Please consult [email or web address] for contact info under Cardiology/STEMI.      Signed, Versie Starks, DO  12/31/2018, 7:02 AM     Patient seen and examined. Agree with assessment and plan.  Continues to slowly improve.  Net diuresis over the past 2 days approximately -7500 cc.  Discussed with Dr. Carolin Coy.  Creatinine increased today to 1.02.  Agree with discontinuing continuous Lasix infusion and change to 40 mg IV every 8 hours as already been ordered.  Consider 1 additional dose of acetazolamide 250 mg.  K replete to greater than 4.  Atrial fibrillation rate is improved but still in the 90s on amiodarone 40 mg twice a day in addition to metoprolol 25 mg every 8 hours.  No anginal symptomatology.  Hb stable at 9.6/HCT 31.9.    Lennette Bihari, MD, [facility] 12/31/2018 12:17 PM

## 2018-12-31 NOTE — Progress Notes (Signed)
PT BP marginal, SBP 70-80s, asymptomatic. Afternoon dose of metoprolol and furosemide held. Dr Claiborne Billings called and made aware, per MD ok to not give those 2 medications.

## 2018-12-31 NOTE — Progress Notes (Signed)
CT surgery p.m. rounds  Patient's oxygen saturation 88%, Lasix 10 mg IV ordered Patient will need BiPAP planned at bedtime Patient appears comfortable and oriented

## 2019-01-01 ENCOUNTER — Other Ambulatory Visit: Payer: Self-pay | Admitting: Cardiothoracic Surgery

## 2019-01-01 DIAGNOSIS — I4819 Other persistent atrial fibrillation: Secondary | ICD-10-CM

## 2019-01-01 DIAGNOSIS — E8770 Fluid overload, unspecified: Secondary | ICD-10-CM

## 2019-01-01 DIAGNOSIS — Z8679 Personal history of other diseases of the circulatory system: Secondary | ICD-10-CM

## 2019-01-01 DIAGNOSIS — E871 Hypo-osmolality and hyponatremia: Secondary | ICD-10-CM

## 2019-01-01 LAB — BASIC METABOLIC PANEL
Anion gap: 13 (ref 5–15)
BUN: 29 mg/dL — ABNORMAL HIGH (ref 8–23)
CO2: 36 mmol/L — ABNORMAL HIGH (ref 22–32)
Calcium: 9.1 mg/dL (ref 8.9–10.3)
Chloride: 83 mmol/L — ABNORMAL LOW (ref 98–111)
Creatinine, Ser: 1.1 mg/dL — ABNORMAL HIGH (ref 0.44–1.00)
GFR calc Af Amer: 54 mL/min — ABNORMAL LOW (ref >60)
GFR calc non Af Amer: 46 mL/min — ABNORMAL LOW (ref >60)
Glucose, Bld: 119 mg/dL — ABNORMAL HIGH (ref 70–99)
Potassium: 3.6 mmol/L (ref 3.5–5.1)
Sodium: 132 mmol/L — ABNORMAL LOW (ref 135–145)

## 2019-01-01 LAB — MAGNESIUM: Magnesium: 2.2 mg/dL (ref 1.7–2.4)

## 2019-01-01 LAB — TSH: TSH: 5.428 u[IU]/mL — ABNORMAL HIGH (ref 0.350–4.500)

## 2019-01-01 LAB — GLUCOSE, CAPILLARY: Glucose-Capillary: 102 mg/dL — ABNORMAL HIGH (ref 70–99)

## 2019-01-01 LAB — T4, FREE: Free T4: 1.2 ng/dL — ABNORMAL HIGH (ref 0.61–1.12)

## 2019-01-01 MED ORDER — FOOD THICKENER (SIMPLYTHICK)
2.0000 | ORAL | Status: DC | PRN
  Filled 2019-01-01: qty 2

## 2019-01-01 MED ORDER — RESOURCE THICKENUP CLEAR PO POWD
ORAL | Status: DC | PRN
  Administered 2019-01-01 – 2019-01-03 (×3): via ORAL
  Filled 2019-01-01: qty 125

## 2019-01-01 MED ORDER — FUROSEMIDE 10 MG/ML IJ SOLN
20.0000 mg | Freq: Two times a day (BID) | INTRAMUSCULAR | Status: DC
  Administered 2019-01-01 – 2019-01-02 (×3): 20 mg via INTRAVENOUS
  Filled 2019-01-01 (×3): qty 2

## 2019-01-01 NOTE — Progress Notes (Signed)
RT NOTE: Patient off bipap prior to RT arrival. Patient talking on the phone and eating while sating 95% on 4L [place]. No distress noted. RT will continue to monitor as needed.

## 2019-01-01 NOTE — Progress Notes (Signed)
Pt. Refused the bipap for tonight. RT informed pt. To notify if she changes her mind.

## 2019-01-01 NOTE — Care Management (Signed)
CM consult for [facility] acknowledged; we are still working the patient up for ST SNF placement.  Midge Minium RN, BSN, NCM-BC, ACM-RN [phone]

## 2019-01-01 NOTE — Progress Notes (Addendum)
Progress Note  Patient Name: Kaitlyn Rodriguez Date of Encounter: 01/01/2019  Primary Cardiologist: No primary care provider on file.   Subjective   States she does not feel as well today but unable to elaborate. No SOB or CP.   Inpatient Medications    Scheduled Meds: . sodium chloride   Intravenous Once  . amiodarone  400 mg Oral BID  . apixaban  5 mg Oral BID  . aspirin  81 mg Oral Daily  . bisacodyl  10 mg Oral Daily   Or  . bisacodyl  10 mg Rectal Daily  . Chlorhexidine Gluconate Cloth  6 each Topical Daily  . docusate sodium  200 mg Oral Daily  . feeding supplement (ENSURE ENLIVE)  237 mL Oral BID BM  . furosemide  40 mg Intravenous TID  . levalbuterol  0.63 mg Nebulization BID  . levothyroxine  150 mcg Oral QAC breakfast  . mouth rinse  15 mL Mouth Rinse BID  . metoprolol tartrate  25 mg Per Tube BID   Or  . metoprolol tartrate  25 mg Oral BID  . multivitamin with minerals  1 tablet Oral Daily  . pantoprazole  40 mg Oral Daily  . potassium chloride  20 mEq Oral Daily  . sodium chloride flush  3 mL Intravenous Q12H   Continuous Infusions: . sodium chloride     PRN Meds: Place/Maintain arterial line **AND** sodium chloride, metoprolol tartrate, naLOXone (NARCAN)  injection, ondansetron (ZOFRAN) IV, Resource ThickenUp Clear   Vital Signs    Vitals:   01/01/19 0500 01/01/19 0600 01/01/19 0615 01/01/19 0630  BP: 100/76 122/80    Pulse: 89 91 (!) 119 100  Resp: 17 19 (!) 26 (!) 24  Temp:      TempSrc:      SpO2: 97% 96% 95% 94%  Weight:   94.1 kg   Height:        Intake/Output Summary (Last 24 hours) at 01/01/2019 0643 Last data filed at 01/01/2019 0600 Gross per 24 hour  Intake 997.43 ml  Output 2350 ml  Net -1352.57 ml   Filed Weights   12/30/18 0500 12/31/18 0500 01/01/19 0615  Weight: 96.5 kg 93.7 kg 94.1 kg    Telemetry    Afib without RVR  - Personally Reviewed  ECG    None new today, last 11/3 with atrial fibrillation and mild condution  block   Physical Exam   GEN: No acute distress.   Neck: No JVD, AT, [place] Cardiac: irregula rate & rhythm, no murmurs, rubs, or gallops.  Respiratory: Clear to auscultation bilaterally. GI: Soft, nontender, non-distended  MS: no edema; No deformity. Neuro:  Nonfocal  Psych: Normal affect   Labs    Chemistry Recent Labs  Lab 12/25/18 1640  12/26/18 1148  12/28/18 0234  12/29/18 1659 12/30/18 0304 12/30/18 1500 12/31/18 0122  NA 132*   < >  --    < > 136   < > 136 137 137 136  K 4.7   < >  --    < > 4.1   < > 3.1* 3.1* 3.7 3.5  CL 96*   < >  --    < > 98   < > 93* 91* 89* 87*  CO2 27   < >  --    < > 30   < > 33* 35* 38* 36*  GLUCOSE 116*   < >  --    < > 102*   < >  122* 100* 114* 102*  BUN 23   < >  --    < > 25*   < > 24* 21 22 24*  CREATININE 0.95   < >  --    < > 0.85   < > 0.82 0.87 1.01* 1.02*  CALCIUM 8.3*   < >  --    < > 8.3*   < > 8.4* 8.6* 8.5* 8.6*  PROT 5.0*  --  5.2*  --  5.3*  --   --   --   --   --   ALBUMIN 2.3*  --  2.3*  --  2.4*  --  2.6*  --   --   --   AST 17  --  19  --  13*  --   --   --   --   --   ALT 61*  --  50*  --  31  --   --   --   --   --   ALKPHOS 68  --  76  --  59  --   --   --   --   --   BILITOT 0.7  --  0.4  --  0.6  --   --   --   --   --   GFRNONAA 55*   < >  --    < > >60   < > >60 >60 51* 51*  GFRAA >60   < >  --    < > >60   < > >60 >60 60* 59*  ANIONGAP 9   < >  --    < > 8   < > 10 11 10 13    < > = values in this interval not displayed.     Hematology Recent Labs  Lab 12/29/18 0309 12/29/18 0757 12/30/18 0304 12/31/18 0122  WBC 8.6  --  10.4 10.6*  RBC 3.68*  --  3.81* 3.99  HGB 9.0* 10.5* 9.2* 9.6*  HCT 30.2* 31.0* 31.0* 31.9*  MCV 82.1  --  81.4 79.9*  MCH 24.5*  --  24.1* 24.1*  MCHC 29.8*  --  29.7* 30.1  RDW 22.2*  --  22.2* 22.4*  PLT 437*  --  561* 605*    Cardiac EnzymesNo results for input(s): TROPONINI in the last 168 hours. No results for input(s): TROPIPOC in the last 168 hours.   BNPNo results for  input(s): BNP, PROBNP in the last 168 hours.   DDimer No results for input(s): DDIMER in the last 168 hours.   Radiology    No results found.  Cardiac Studies   ECHO 10/30 IMPRESSIONS  1. Left ventricular ejection fraction, by visual estimation, is 50 to 55%. The left ventricle has normal function. There is no left ventricular hypertrophy. 2. Global right ventricle has mildly reduced systolic function.The right ventricular size is mildly enlarged. No increase in right ventricular wall thickness. 3. Left atrial size was not assessed. 4. Right atrial size was not assessed. 5. Small pericardial effusion. 6. The mitral valve is grossly normal. No evidence of mitral valve regurgitation. 7. The tricuspid valve is grossly normal. Tricuspid valve regurgitation is mild. 8. The aortic valve is grossly normal. Aortic valve regurgitation is not visualized. 9. The pulmonic valve was not assessed. Pulmonic valve regurgitation is not visualized. 10. Aortic root could not be assessed. 11. LVEF improved from the prior study, now 50-55% with paradoxical septal motion. 12. RVEF mildly decreased.  13. Small amount pf pericardial effusion with no signs of tamponade. 14. The interatrial septum was not assessed.  ECHO 10/26 1. Left ventricular ejection fraction, by visual estimation, is 35 to 40%. The left ventricle has moderately decreased function. Normal left ventricular size. There is no left ventricular hypertrophy. 2. Left ventricular diastolic Doppler parameters are consistent with impaired relaxation pattern of LV diastolic filling. 3. Global right ventricle has normal systolic function.The right ventricular size is normal. No increase in right ventricular wall thickness. 4. Left atrial size was normal. 5. Right atrial size was normal. 6. Mild mitral annular calcification. 7. The mitral valve is grossly normal. Mild to moderate mitral valve regurgitation. 8. The tricuspid valve  is normal in structure. Tricuspid valve regurgitation is trivial. 9. The aortic valve is tricuspid Aortic valve regurgitation is mild by color flow Doppler. Mild to moderate aortic valve sclerosis/calcification without any evidence of aortic stenosis. 10. The pulmonic valve was grossly normal. Pulmonic valve regurgitation is not visualized by color flow Doppler. 11. Mildly elevated pulmonary artery systolic pressure.  Patient Profile     85 y.o. 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 aoritc dissection - she is s/p repair 10/24, continuing to do well. Likely stable for transfer. Cont asa 81 mg. 2. Respiratory failure - On [place] now.   3. Atrial fib - metoprolol 25 mg decreased for tid to bid yesterday 2/2 softer bp. Cont amio 400 mg bid to total 10g. Eliquis recently restarted, check TSH today.  4. Hypotension - blood pressure improved today after decreasing metop.  5. Volume overload - Net negative 1.3L yesterday but net positive 8.5 still. Lasix held yesterday due to soft blood pressure. She receive 40 mg IV in the am and 20 mg in the evening. Will decrease lasix to 20 mg IV bid. Repeat labs pending.    For questions or updates, please contact [facility] Please consult [email or web address] for contact info under Cardiology/STEMI.      Signed, Versie StarksJaimie A Seawell, DO  01/01/2019, 6:43 AM     Patient seen and examined. Agree with assessment and plan.  She continues to be in atrial fibrillation.  To date she has received approximately 5.5 g since initiation.  AF rate controlled with concomitant metoprolol now at 25 mg twice a day.  Blood pressure improved today.  She has continued to diurese but had developed low blood pressure yesterday leading to reduction of dosing.  Recommend changing to Lasix 20 mg twice daily.  No anginal symptoms.  Follow-up creatinine today 1.1 with BUN 29.  Magnesium 2.2.  Mildly hyponatremic with sodium 132.   Lennette Biharihomas A.  Thaila Bottoms, MD, [facility] 01/01/2019 11:40 AM

## 2019-01-01 NOTE — Progress Notes (Signed)
EVENING ROUNDS NOTE :     [address]       [place] [address]             [phone]                 12 Days Post-Op Procedure(s) (LRB): REPAIR OF TYPE A AORTIC DISSECTION USING HEMASHIELD PLATINUM 28MM AND 32MM GRAFT (N/A) STERNAL PLATING   Total Length of Stay:  LOS: 11 days  Events:  Feels better Sitting up, on [place]    BP 103/65   Pulse (!) 125   Temp 98.3 F (36.8 C) (Oral)   Resp 20   Ht 5\' 5"  (1.651 m)   Wt 94.1 kg   SpO2 (!) 85%   BMI 34.52 kg/m         . sodium chloride      I/O last 3 completed shifts: In: 1109.5 [P.O.:800; I.V.:227.7; IV Piggyback:81.8] Out: 4535 [Urine:4535]   CBC Latest Ref Rng & Units 12/31/2018 12/30/2018 12/29/2018  WBC 4.0 - 10.5 K/uL 10.6(H) 10.4 -  Hemoglobin 12.0 - 15.0 g/dL 9.6(L) 9.2(L) 10.5(L)  Hematocrit 36.0 - 46.0 % 31.9(L) 31.0(L) 31.0(L)  Platelets 150 - 400 K/uL 605(H) 561(H) -    BMP Latest Ref Rng & Units 01/01/2019 12/31/2018 12/30/2018  Glucose 70 - 99 mg/dL 119(H) 102(H) 114(H)  BUN 8 - 23 mg/dL 29(H) 24(H) 22  Creatinine 0.44 - 1.00 mg/dL 1.10(H) 1.02(H) 1.01(H)  Sodium 135 - 145 mmol/L 132(L) 136 137  Potassium 3.5 - 5.1 mmol/L 3.6 3.5 3.7  Chloride 98 - 111 mmol/L 83(L) 87(L) 89(L)  CO2 22 - 32 mmol/L 36(H) 36(H) 38(H)  Calcium 8.9 - 10.3 mg/dL 9.1 8.6(L) 8.5(L)    ABG    Component Value Date/Time   PHART 7.350 12/29/2018 0757   PCO2ART 56.6 (H) 12/29/2018 0757   PO2ART 91.0 12/29/2018 0757   HCO3 31.3 (H) 12/29/2018 0757   TCO2 33 (H) 12/29/2018 0757   ACIDBASEDEF 1.0 12/21/2018 1214   O2SAT 96.0 12/29/2018 0757       Melodie Bouillon, MD 01/01/2019 5:30 PM

## 2019-01-01 NOTE — Progress Notes (Signed)
12 Days Post-Op Procedure(s) (LRB): REPAIR OF TYPE A AORTIC DISSECTION USING HEMASHIELD PLATINUM 28MM AND 32MM GRAFT (N/A) STERNAL PLATING Subjective: Feeling tired; would like to go home  Objective: Vital signs in last 24 hours: Temp:  [97.8 F (36.6 C)-98.3 F (36.8 C)] 98.3 F (36.8 C) (11/05 1142) Pulse Rate:  [45-125] 125 (11/05 1600) Cardiac Rhythm: Atrial fibrillation (11/05 1400) Resp:  [17-32] 20 (11/05 1600) BP: (76-131)/(55-93) 103/65 (11/05 1600) SpO2:  [80 %-99 %] 85 % (11/05 1600) Weight:  [94.1 kg] 94.1 kg (11/05 0615)  Hemodynamic parameters for last 24 hours:    Intake/Output from previous day: 11/04 0701 - 11/05 0700 In: 989.4 [P.O.:800; I.V.:107.6; IV Piggyback:81.8] Out: 2380 [Urine:2380] Intake/Output this shift: No intake/output data recorded.  General appearance: alert, cooperative and no distress Neurologic: intact Heart: irregularly irregular rhythm Lungs: clear to auscultation bilaterally Abdomen: soft, non-tender; bowel sounds normal; no masses,  no organomegaly Extremities: extremities normal, atraumatic, no cyanosis or edema Wound: c/d/i  Lab Results: Recent Labs    12/30/18 0304 12/31/18 0122  WBC 10.4 10.6*  HGB 9.2* 9.6*  HCT 31.0* 31.9*  PLT 561* 605*   BMET:  Recent Labs    12/31/18 0122 01/01/19 0956  NA 136 132*  K 3.5 3.6  CL 87* 83*  CO2 36* 36*  GLUCOSE 102* 119*  BUN 24* 29*  CREATININE 1.02* 1.10*  CALCIUM 8.6* 9.1    PT/INR: No results for input(s): LABPROT, INR in the last 72 hours. ABG    Component Value Date/Time   PHART 7.350 12/29/2018 0757   HCO3 31.3 (H) 12/29/2018 0757   TCO2 33 (H) 12/29/2018 0757   ACIDBASEDEF 1.0 12/21/2018 1214   O2SAT 96.0 12/29/2018 0757   CBG (last 3)  Recent Labs    12/30/18 1646 12/30/18 2019 01/01/19 0417  GLUCAP 107* 114* 102*    Assessment/Plan: S/P Procedure(s) (LRB): REPAIR OF TYPE A AORTIC DISSECTION USING HEMASHIELD PLATINUM 28MM AND 32MM GRAFT  (N/A) STERNAL PLATING Mobilize Diuresis would appreciate PT/OT for discharge planning   LOS: 11 days    Wonda Olds 01/01/2019

## 2019-01-01 NOTE — Plan of Care (Signed)

## 2019-01-01 NOTE — Progress Notes (Signed)
Physical Therapy Treatment Patient Details Name: Kaitlyn Rodriguez MRN: [id number] DOB: [birth year] Today's Date: 01/01/2019    History of Present Illness Patient is an 85 y/o female admitted with 1 week of subtle symptoms of malaise and fatigue, shoulder pain which worsened on 12/20/2018.   Work-up suggested myocardial infarction and assessment showed large pericardial effusion.  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 Pt with worsening respiratory status on 10/29 requiring BiPap, transitioning to critical care on 10/30.    PT Comments    Pt limited by fatigue and elevated HR during session. Pt continues to require significant physical assistance to perform functional mobility tasks, however does demonstrate improved activation of LE musculature and a slight increase in effort this session. Pt with labile HR ranging from 107-151 during mobility.  Pt requires significant encouragement to participate in therapy, seeming anxious and with depressed at times during session. Pt will continue to benefit from PT POC to improve LE strength, endurance, and reduce falls risk.  Follow Up Recommendations  SNF;Supervision/Assistance - 24 hour     Equipment Recommendations  Wheelchair (measurements PT);Hospital bed    Recommendations for Other Services       Precautions / Restrictions Precautions Precautions: Fall;Sternal Restrictions Weight Bearing Restrictions: No Other Position/Activity Restrictions: sternal precautions     Mobility  Bed Mobility Overal bed mobility: (pt up in recliner upon arrival and left in recliner)                Transfers Overall transfer level: Needs assistance Equipment used: 2 person hand held assist Transfers: Sit to/from Stand;Stand Pivot Transfers Sit to Stand: Mod assist Stand pivot transfers: Mod assist       General transfer comment: pt grasping PT elbows  to avoid pushing with UEs  Ambulation/Gait Ambulation/Gait assistance: Mod assist Gait Distance (Feet): 2 Feet Assistive device: 2 person hand held assist Gait Pattern/deviations: Shuffle Gait velocity: reduced Gait velocity interpretation: <1.31 ft/sec, indicative of household ambulator General Gait Details: pt taking side steps from recliner to [facility] and back   Stairs             Wheelchair Mobility    Modified Rankin (Stroke Patients Only)       Balance Overall balance assessment: Needs assistance Sitting-balance support: Bilateral upper extremity supported;Feet supported Sitting balance-Leahy Scale: Fair Sitting balance - Comments: minG   Standing balance support: Bilateral upper extremity supported Standing balance-Leahy Scale: Poor Standing balance comment: modA of 1 person with BUE support                            Cognition Arousal/Alertness: Awake/alert Behavior During Therapy: WFL for tasks assessed/performed Overall Cognitive Status: Within Functional Limits for tasks assessed                                        Exercises General Exercises - Lower Extremity Ankle Circles/Pumps: AROM;Both;20 reps Gluteal Sets: AROM;Both;10 reps Long Arc Quad: AROM;Both;20 reps Hip Flexion/Marching: AROM;Both;20 reps    General Comments General comments (skin integrity, edema, etc.): pt tachy at rest and throughout session with HR ranging from 107-151 during limited mobility. Pt reports mild SOB during mobility, appears in NAD upon PT departure, RN made aware of HR      Pertinent Vitals/Pain Pain Assessment: Faces Faces Pain  Scale: Hurts little more Pain Location: incision Pain Descriptors / Indicators: Grimacing Pain Intervention(s): Limited activity within patient's tolerance    Home Living                      Prior Function            PT Goals (current goals can now be found in the care plan section) Acute Rehab  PT Goals Patient Stated Goal: to return home Progress towards PT goals: Progressing toward goals    Frequency    Min 2X/week      PT Plan Current plan remains appropriate    Co-evaluation              AM-PAC PT "6 Clicks" Mobility   Outcome Measure  Help needed turning from your back to your side while in a flat bed without using bedrails?: A Lot Help needed moving from lying on your back to sitting on the side of a flat bed without using bedrails?: A Lot Help needed moving to and from a bed to a chair (including a wheelchair)?: A Lot Help needed standing up from a chair using your arms (e.g., wheelchair or bedside chair)?: A Lot Help needed to walk in hospital room?: Total Help needed climbing 3-5 steps with a railing? : Total 6 Click Score: 10    End of Session Equipment Utilized During Treatment: Gait belt;Oxygen Activity Tolerance: Patient limited by fatigue Patient left: in chair;with call bell/phone within reach;with family/visitor present Nurse Communication: Mobility status PT Visit Diagnosis: Muscle weakness (generalized) (M62.81)     Time: 3833-3832 PT Time Calculation (min) (ACUTE ONLY): 29 min  Charges:  $Therapeutic Exercise: 8-22 mins $Therapeutic Activity: 8-22 mins                     Zenaida Niece, PT, DPT Acute Rehabilitation Pager: [phone]    Zenaida Niece 01/01/2019, 12:25 PM

## 2019-01-01 NOTE — Progress Notes (Signed)
Pt refusing to take her daily am meds until after lunch. Pt educated on the need for her to at least take her medications to keep her heart rate under control and she still refuses. PT paged to make sure they see her today.   Lucius Conn, RN

## 2019-01-02 ENCOUNTER — Ambulatory Visit: Payer: Self-pay | Admitting: Cardiothoracic Surgery

## 2019-01-02 ENCOUNTER — Inpatient Hospital Stay (HOSPITAL_COMMUNITY): Payer: Medicare Other

## 2019-01-02 LAB — BASIC METABOLIC PANEL
Anion gap: 12 (ref 5–15)
BUN: 33 mg/dL — ABNORMAL HIGH (ref 8–23)
CO2: 37 mmol/L — ABNORMAL HIGH (ref 22–32)
Calcium: 8.6 mg/dL — ABNORMAL LOW (ref 8.9–10.3)
Chloride: 85 mmol/L — ABNORMAL LOW (ref 98–111)
Creatinine, Ser: 1.08 mg/dL — ABNORMAL HIGH (ref 0.44–1.00)
GFR calc Af Amer: 55 mL/min — ABNORMAL LOW (ref >60)
GFR calc non Af Amer: 47 mL/min — ABNORMAL LOW (ref >60)
Glucose, Bld: 100 mg/dL — ABNORMAL HIGH (ref 70–99)
Potassium: 3.6 mmol/L (ref 3.5–5.1)
Sodium: 134 mmol/L — ABNORMAL LOW (ref 135–145)

## 2019-01-02 LAB — CBC WITH DIFFERENTIAL/PLATELET
Abs Immature Granulocytes: 0.11 10*3/uL — ABNORMAL HIGH (ref 0.00–0.07)
Basophils Absolute: 0 10*3/uL (ref 0.0–0.1)
Basophils Relative: 0 %
Eosinophils Absolute: 0.1 10*3/uL (ref 0.0–0.5)
Eosinophils Relative: 1 %
HCT: 30.5 % — ABNORMAL LOW (ref 36.0–46.0)
Hemoglobin: 9.3 g/dL — ABNORMAL LOW (ref 12.0–15.0)
Immature Granulocytes: 1 %
Lymphocytes Relative: 12 %
Lymphs Abs: 1.1 10*3/uL (ref 0.7–4.0)
MCH: 24.3 pg — ABNORMAL LOW (ref 26.0–34.0)
MCHC: 30.5 g/dL (ref 30.0–36.0)
MCV: 79.6 fL — ABNORMAL LOW (ref 80.0–100.0)
Monocytes Absolute: 0.9 10*3/uL (ref 0.1–1.0)
Monocytes Relative: 9 %
Neutro Abs: 7.5 10*3/uL (ref 1.7–7.7)
Neutrophils Relative %: 77 %
Platelets: 645 10*3/uL — ABNORMAL HIGH (ref 150–400)
RBC: 3.83 MIL/uL — ABNORMAL LOW (ref 3.87–5.11)
RDW: 22.5 % — ABNORMAL HIGH (ref 11.5–15.5)
WBC: 9.7 10*3/uL (ref 4.0–10.5)
nRBC: 0 % (ref 0.0–0.2)

## 2019-01-02 LAB — MAGNESIUM: Magnesium: 2.1 mg/dL (ref 1.7–2.4)

## 2019-01-02 MED ORDER — POTASSIUM CHLORIDE 20 MEQ/15ML (10%) PO SOLN
20.0000 meq | Freq: Every day | ORAL | Status: DC
  Administered 2019-01-03: 20 meq via ORAL
  Filled 2019-01-02: qty 15

## 2019-01-02 MED ORDER — FUROSEMIDE 10 MG/ML IJ SOLN
20.0000 mg | Freq: Two times a day (BID) | INTRAMUSCULAR | Status: AC
  Administered 2019-01-02: 20 mg via INTRAVENOUS
  Filled 2019-01-02: qty 2

## 2019-01-02 MED ORDER — POTASSIUM CHLORIDE 20 MEQ/15ML (10%) PO SOLN
40.0000 meq | Freq: Once | ORAL | Status: AC
  Administered 2019-01-02: 40 meq via ORAL
  Filled 2019-01-02: qty 30

## 2019-01-02 MED ORDER — FUROSEMIDE 40 MG PO TABS
40.0000 mg | ORAL_TABLET | Freq: Two times a day (BID) | ORAL | Status: DC
  Administered 2019-01-03 – 2019-01-11 (×18): 40 mg via ORAL
  Filled 2019-01-02 (×18): qty 1

## 2019-01-02 NOTE — Progress Notes (Addendum)
[facility] DAILY ICU PROGRESS NOTE                   [address]            [place] [address]          [phone]   13 Days Post-Op Procedure(s) (LRB): REPAIR OF TYPE A AORTIC DISSECTION USING HEMASHIELD PLATINUM AND GRAFT (N/A) STERNAL PLATING  Total Length of Stay:  LOS: 12 days   Subjective: Patient sitting in chair. She has no specific complaints this am.  Objective: Vital signs in last 24 hours: Temp:  [97.4 F (36.3 C)-98.3 F (36.8 C)] 97.4 F (36.3 C) (11/06 0744) Pulse Rate:  [45-125] 97 (11/06 0700) Cardiac Rhythm: Atrial fibrillation (11/05 2000) Resp:  [15-27] 16 (11/06 0700) BP: (80-131)/(59-93) (P) 97/70 (11/06 0700) SpO2:  [80 %-99 %] 93 % (11/06 0750) Weight:  [93.3 kg] 93.3 kg (11/06 0600)  Filed Weights   12/31/18 0500 01/01/19 0615 01/02/19 0600  Weight: 93.7 kg 94.1 kg 93.3 kg    Weight change: -0.8 kg      Intake/Output from previous day: 11/05 0701 - 11/06 0700 In: 180 [P.O.:180] Out: 750 [Urine:750]  Intake/Output this shift: No intake/output data recorded.  Current Meds: Scheduled Meds: . sodium chloride   Intravenous Once  . amiodarone  400 mg Oral BID  . apixaban  5 mg Oral BID  . aspirin  81 mg Oral Daily  . bisacodyl  10 mg Oral Daily   Or  . bisacodyl  10 mg Rectal Daily  . Chlorhexidine Gluconate Cloth  6 each Topical Daily  . docusate sodium  200 mg Oral Daily  . feeding supplement (ENSURE ENLIVE)  237 mL Oral BID BM  . furosemide  20 mg Intravenous Q12H  . levalbuterol  0.63 mg Nebulization BID  . levothyroxine  150 mcg Oral QAC breakfast  . mouth rinse  15 mL Mouth Rinse BID  . metoprolol tartrate  25 mg Per Tube BID   Or  . metoprolol tartrate  25 mg Oral BID  . multivitamin with minerals  1 tablet Oral Daily  . pantoprazole  40 mg Oral Daily  . [START ON 01/03/2019] potassium chloride  20 mEq Oral Daily  . potassium chloride  40 mEq Oral Once  . sodium chloride flush  3 mL Intravenous Q12H    Continuous Infusions: . sodium chloride     PRN Meds:.Place/Maintain arterial line **AND** sodium chloride, metoprolol tartrate, naLOXone (NARCAN)  injection, ondansetron (ZOFRAN) IV, Resource ThickenUp Clear  Cardiovascular: XTKW IOXB Pulmonary: Diminished bibasilar breath sounds Abdomen: Soft, non tender, bowel sounds present. Extremities: Mild bilateral lower extremity edema. Wounds: Clean and dry.  No erythema or signs of infection.   Lab Results: CBC: Recent Labs    12/31/18 0122 01/02/19 0331  WBC 10.6* 9.7  HGB 9.6* 9.3*  HCT 31.9* 30.5*  PLT 605* 645*   BMET:  Recent Labs    01/01/19 0956 01/02/19 0331  NA 132* 134*  K 3.6 3.6  CL 83* 85*  CO2 36* 37*  GLUCOSE 119* 100*  BUN 29* 33*  CREATININE 1.10* 1.08*  CALCIUM 9.1 8.6*    CMET: Lab Results  Component Value Date   WBC 9.7 01/02/2019   HGB 9.3 (L) 01/02/2019   HCT 30.5 (L) 01/02/2019   PLT 645 (H) 01/02/2019   GLUCOSE 100 (H) 01/02/2019   CHOL 86 12/22/2018   TRIG 58 12/22/2018   HDL 22 (  L) 12/22/2018   LDLCALC 52 12/22/2018   ALT 31 12/28/2018   AST 13 (L) 12/28/2018   NA 134 (L) 01/02/2019   K 3.6 01/02/2019   CL 85 (L) 01/02/2019   CREATININE 1.08 (H) 01/02/2019   BUN 33 (H) 01/02/2019   CO2 37 (H) 01/02/2019   TSH 5.428 (H) 01/01/2019   INR 2.1 (H) 12/21/2018   HGBA1C 6.3 (H) 12/22/2018      PT/INR: No results for input(s): LABPROT, INR in the last 72 hours. Radiology: No results found.   Assessment/Plan: S/P Procedure(s) (LRB): REPAIR OF TYPE A AORTIC DISSECTION USING HEMASHIELD PLATINUM 28MM AND 32MM GRAFT (N/A) STERNAL PLATING   1. CV - A fib with CVR. SBP remains mostly in the 90's. On Amiodarone 400 mg bid, Apixaban 5 mg bid 2.  Pulmonary - On 2 liters of oxygen via [place]. Wean as able. Will ask nurse to document oxygen saturation to determine if needs oxygen at discharge (likely will need). CXR this am appears stable (low lung volumes, cardiomegaly, b/l pleural effusions  and atelectasis, vascular congestion, and no pneumothorax). Xopenex PRN.  3. Volume Overload - On Lasix 20 mg IV bid 4.  Acute blood loss anemia - H and H this am 9.3 and 30.5 5. Supplement potassium 6. Hypothyroidism-on Levothyroxine 150 mcg daily   Donielle Liston Alba PA-C 01/02/2019 8:22 AM  Pt seen and examined; agree with note. Continue present management; needs disposition. Will likely discharge home from ICU. Haiven Nardone Z. Orvan Seen, [place]

## 2019-01-02 NOTE — Progress Notes (Addendum)
Progress Note  Patient Name: Kaitlyn Rodriguez Date of Encounter: 01/02/2019  Primary Cardiologist: No primary care provider on file.   Subjective   Feeling much better today, no SOB, CP or other concerns.   Inpatient Medications    Scheduled Meds: . sodium chloride   Intravenous Once  . amiodarone  400 mg Oral BID  . apixaban  5 mg Oral BID  . aspirin  81 mg Oral Daily  . bisacodyl  10 mg Oral Daily   Or  . bisacodyl  10 mg Rectal Daily  . Chlorhexidine Gluconate Cloth  6 each Topical Daily  . docusate sodium  200 mg Oral Daily  . feeding supplement (ENSURE ENLIVE)  237 mL Oral BID BM  . furosemide  20 mg Intravenous Q12H  . levalbuterol  0.63 mg Nebulization BID  . levothyroxine  150 mcg Oral QAC breakfast  . mouth rinse  15 mL Mouth Rinse BID  . metoprolol tartrate  25 mg Per Tube BID   Or  . metoprolol tartrate  25 mg Oral BID  . multivitamin with minerals  1 tablet Oral Daily  . pantoprazole  40 mg Oral Daily  . potassium chloride  20 mEq Oral Daily  . sodium chloride flush  3 mL Intravenous Q12H   Continuous Infusions: . sodium chloride     PRN Meds: Place/Maintain arterial line **AND** sodium chloride, metoprolol tartrate, naLOXone (NARCAN)  injection, ondansetron (ZOFRAN) IV, Resource ThickenUp Clear   Vital Signs    Vitals:   01/02/19 0200 01/02/19 0258 01/02/19 0300 01/02/19 0329  BP: (!) 82/63  113/67   Pulse: 70 75 75   Resp: 18 15 15    Temp:   97.6 F (36.4 C)   TempSrc:   Oral   SpO2: 97% 97% 96% 95%  Weight:      Height:        Intake/Output Summary (Last 24 hours) at 01/02/2019 0620 Last data filed at 01/02/2019 0200 Gross per 24 hour  Intake 120 ml  Output 510 ml  Net -390 ml   Filed Weights   12/30/18 0500 12/31/18 0500 01/01/19 0615  Weight: 96.5 kg 93.7 kg 94.1 kg    Telemetry    Afib without RVR  - Personally Reviewed  ECG    None new today, last 11/3 with atrial fibrillation and mild condution block   Physical Exam    GEN: NAD, sitting up in chair.   Neck: No JVD, AT, [place] Cardiac: irregular rate & rhythm, no m/r/g  Respiratory: CTA, on [place] GI: Soft, nontender, non-distended  MS: no LE edema; No deformity. Neuro:  Nonfocal, grossly intact Psych: Normal affect   Labs    Chemistry Recent Labs  Lab 12/26/18 1148  12/28/18 0234  12/29/18 1659  12/31/18 0122 01/01/19 0956 01/02/19 0331  NA  --    < > 136   < > 136   < > 136 132* 134*  K  --    < > 4.1   < > 3.1*   < > 3.5 3.6 3.6  CL  --    < > 98   < > 93*   < > 87* 83* 85*  CO2  --    < > 30   < > 33*   < > 36* 36* 37*  GLUCOSE  --    < > 102*   < > 122*   < > 102* 119* 100*  BUN  --    < >  25*   < > 24*   < > 24* 29* 33*  CREATININE  --    < > 0.85   < > 0.82   < > 1.02* 1.10* 1.08*  CALCIUM  --    < > 8.3*   < > 8.4*   < > 8.6* 9.1 8.6*  PROT 5.2*  --  5.3*  --   --   --   --   --   --   ALBUMIN 2.3*  --  2.4*  --  2.6*  --   --   --   --   AST 19  --  13*  --   --   --   --   --   --   ALT 50*  --  31  --   --   --   --   --   --   ALKPHOS 76  --  59  --   --   --   --   --   --   BILITOT 0.4  --  0.6  --   --   --   --   --   --   GFRNONAA  --    < > >60   < > >60   < > 51* 46* 47*  GFRAA  --    < > >60   < > >60   < > 59* 54* 55*  ANIONGAP  --    < > 8   < > 10   < > 13 13 12    < > = values in this interval not displayed.     Hematology Recent Labs  Lab 12/30/18 0304 12/31/18 0122 01/02/19 0331  WBC 10.4 10.6* 9.7  RBC 3.81* 3.99 3.83*  HGB 9.2* 9.6* 9.3*  HCT 31.0* 31.9* 30.5*  MCV 81.4 79.9* 79.6*  MCH 24.1* 24.1* 24.3*  MCHC 29.7* 30.1 30.5  RDW 22.2* 22.4* 22.5*  PLT 561* 605* 645*    Cardiac EnzymesNo results for input(s): TROPONINI in the last 168 hours. No results for input(s): TROPIPOC in the last 168 hours.   BNPNo results for input(s): BNP, PROBNP in the last 168 hours.   DDimer No results for input(s): DDIMER in the last 168 hours.   Radiology    No results found.  Cardiac Studies   ECHO 10/30  IMPRESSIONS  1. Left ventricular ejection fraction, by visual estimation, is 50 to 55%. The left ventricle has normal function. There is no left ventricular hypertrophy. 2. Global right ventricle has mildly reduced systolic function.The right ventricular size is mildly enlarged. No increase in right ventricular wall thickness. 3. Left atrial size was not assessed. 4. Right atrial size was not assessed. 5. Small pericardial effusion. 6. The mitral valve is grossly normal. No evidence of mitral valve regurgitation. 7. The tricuspid valve is grossly normal. Tricuspid valve regurgitation is mild. 8. The aortic valve is grossly normal. Aortic valve regurgitation is not visualized. 9. The pulmonic valve was not assessed. Pulmonic valve regurgitation is not visualized. 10. Aortic root could not be assessed. 11. LVEF improved from the prior study, now 50-55% with paradoxical septal motion. 12. RVEF mildly decreased. 13. Small amount pf pericardial effusion with no signs of tamponade. 14. The interatrial septum was not assessed.  ECHO 10/26 1. Left ventricular ejection fraction, by visual estimation, is 35 to 40%. The left ventricle has moderately decreased function. Normal left ventricular size. There is no left ventricular hypertrophy.  2. Left ventricular diastolic Doppler parameters are consistent with impaired relaxation pattern of LV diastolic filling. 3. Global right ventricle has normal systolic function.The right ventricular size is normal. No increase in right ventricular wall thickness. 4. Left atrial size was normal. 5. Right atrial size was normal. 6. Mild mitral annular calcification. 7. The mitral valve is grossly normal. Mild to moderate mitral valve regurgitation. 8. The tricuspid valve is normal in structure. Tricuspid valve regurgitation is trivial. 9. The aortic valve is tricuspid Aortic valve regurgitation is mild by color flow Doppler. Mild to moderate  aortic valve sclerosis/calcification without any evidence of aortic stenosis. 10. The pulmonic valve was grossly normal. Pulmonic valve regurgitation is not visualized by color flow Doppler. 11. Mildly elevated pulmonary artery systolic pressure.  Patient Profile     85 y.o. 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 aoritc dissection - she is s/p repair 10/24, continuing to do well. Likely stable for transfer. Cont asa 81 mg. 2. Respiratory failure - intermittently on [place] but overall improved with diuresis. 3. Atrial fib - Rate improved. Metoprolol 25 mg decreased for tid to bid 2/2 softer bp. Cont amio to total 10g - can decreased to 200mg  bid tomorrow. Eliquis recently restarted. TSH & T4 elevated, likely 2/2 amiodarone and beta blocker. Repeat labs in one week.  4. Hypotension - bp improved.  5. Volume overload - Net negative .5L yesterday, has diuresed about 9L in four days. Still net +8L. Lasix decreased 2/2 soft bp. On IV 20 mg bid yesterday. Will switch to 40 po tomorrow.    For questions or updates, please contact [place] Please consult [email or web address] for contact info under Cardiology/STEMI.      Signed, Marty Heck, DO  01/02/2019, 6:20 AM     Patient seen and examined. Agree with assessment and plan. Feels much better today. AF rate in the 90s; can transition to 200 mg bid tomorrow. Plan to transitiion to oral lasix tomorrow.    Troy Sine, MD, [facility] 01/02/2019 10:53 AM

## 2019-01-02 NOTE — Progress Notes (Signed)
      [address]       [place] [address]             [phone]      Up in chair  BP 95/63 (BP Location: Right Arm)   Pulse 80   Temp 98 F (36.7 C) (Oral)   Resp 17   Ht 5\' 5"  (1.651 m)   Wt 93.3 kg   SpO2 97%   BMI 34.23 kg/m   Atrial fib with controlled rate Sats in high 90s on 2L [place]  Intake/Output Summary (Last 24 hours) at 01/02/2019 1710 Last data filed at 01/02/2019 1200 Gross per 24 hour  Intake 660 ml  Output 750 ml  Net -90 ml   Continue current Rx  Shontae Rosiles C. Roxan Hockey, MD [facility] [phone]

## 2019-01-02 NOTE — Progress Notes (Deleted)
      [address]       [place] [address]             [phone]        13 Days Post-Op Procedure(s) (LRB): REPAIR OF TYPE A AORTIC DISSECTION USING HEMASHIELD PLATINUM 28MM AND 32MM GRAFT (N/A) STERNAL PLATING  Subjective: Patient sitting in chair. She has no specific complaints this am.  Objective: Vital signs in last 24 hours: Temp:  [97.4 F (36.3 C)-98.3 F (36.8 C)] 97.4 F (36.3 C) (11/06 0744) Pulse Rate:  [45-125] 97 (11/06 0700) Cardiac Rhythm: Atrial fibrillation (11/05 2000) Resp:  [15-27] 16 (11/06 0700) BP: (80-131)/(59-93) 90/65 (11/06 0600) SpO2:  [80 %-99 %] 93 % (11/06 0750) Weight:  [93.3 kg] 93.3 kg (11/06 0600)  Pre op weight 86.2 kg Current Weight  01/02/19 93.3 kg       Intake/Output from previous day: 11/05 0701 - 11/06 0700 In: 180 [P.O.:180] Out: 750 [Urine:750]   Physical Exam:  Cardiovascular: IRRR IRRR Pulmonary: Diminished bibasilar breath sounds Abdomen: Soft, non tender, bowel sounds present. Extremities: Mild bilateral lower extremity edema. Wounds: Clean and dry.  No erythema or signs of infection.  Lab Results: CBC: Recent Labs    12/31/18 0122 01/02/19 0331  WBC 10.6* 9.7  HGB 9.6* 9.3*  HCT 31.9* 30.5*  PLT 605* 645*   BMET:  Recent Labs    01/01/19 0956 01/02/19 0331  NA 132* 134*  K 3.6 3.6  CL 83* 85*  CO2 36* 37*  GLUCOSE 119* 100*  BUN 29* 33*  CREATININE 1.10* 1.08*  CALCIUM 9.1 8.6*    PT/INR:  Lab Results  Component Value Date   INR 2.1 (H) 12/21/2018   INR 1.3 (H) 12/20/2018   ABG:  INR: Will add last result for INR, ABG once components are confirmed Will add last 4 CBG results once components are confirmed  Assessment/Plan:  1. CV - A fib with CVR. SBP remains mostly in the 90's. On Amiodarone 400 mg bid, Apixaban 5 mg bid 2.  Pulmonary - On 2 liters of oxygen via [place]. Wean as able. Will ask nurse to document oxygen saturation to determine if needs oxygen at  discharge (likely will need). CXR this am appears stable (low lung volumes, cardiomegaly, b/l pleural effusions and atelectasis, vascular congestion, and no pneumothorax). Xopenex PRN.  3. Volume Overload - On Lasix 20 mg IV bid 4.  Acute blood loss anemia - H and H this am 9.3 and 30.5 5. Supplement potassium 6. Hypothyroidism-on Levothyroxine 150 mcg daily  Kaitlyn Rodriguez M ZimmermanPA-C 01/02/2019,7:59 AM

## 2019-01-02 NOTE — Evaluation (Signed)
Clinical/Bedside Swallow Evaluation Patient Details  Name: Kaitlyn Rodriguez MRN: [id number] Date of Birth: 1933-08-04  Today's Date: 01/02/2019 Time: SLP Start Time (ACUTE ONLY): 1006 SLP Stop Time (ACUTE ONLY): 1013 SLP Time Calculation (min) (ACUTE ONLY): 7 min  Past Medical History:  Past Medical History:  Diagnosis Date  . Thyroid disease    Past Surgical History:  Past Surgical History:  Procedure Laterality Date  . ABDOMINAL HYSTERECTOMY     at age 85  . REPLACEMENT ASCENDING AORTA N/A 12/20/2018   Procedure: REPAIR OF TYPE A AORTIC DISSECTION USING HEMASHIELD PLATINUM AND GRAFT;  Surgeon: Linden Dolin, MD;  Location: [facility] OR;  Service: Open Heart Surgery;  Laterality: N/A;  . STERNAL CLOSURE  12/20/2018   Procedure: STERNAL PLATING;  Surgeon: Linden Dolin, MD;  Location: [facility] OR;  Service: Open Heart Surgery;;   HPI:  Patient is an 85 y/o female admitted with 1 week of subtle symptoms of malaise and fatigue, shoulder pain which worsened on 12/20/2018.   Work-up suggested myocardial infarction and assessment showed large pericardial effusion.  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 Pt with worsening respiratory status on 10/29 requiring BiPap, transitioning to critical care on 10/30.  CT 11/1: no acute findings.  CXR 11/6: "No interval change in the appearance of the chest with persistentright pleural effusion and bibasilar consolidation"   Assessment / Plan / Recommendation Clinical Impression  Pt presents with normal swallow function as assessed clinically. Pt tolerated all consistencies trialed with no clinical s/s of aspiration and exhibited good oral clearance of regular solids.  Pt is currently on nectar thick liuqids.  She states she was changed to modified liquid the day before yesterday. It is unclear why diet was modified, but she is eager to advance to thin  liquid.    Recommend continuing regular texture diet and advancing to thin liquid.  Pt has no further ST needs at this time.  SLP Visit Diagnosis: Dysphagia, unspecified (R13.10)    Aspiration Risk  No limitations    Diet Recommendation Regular;Thin liquid   Liquid Administration via: Cup;Straw Medication Administration: Whole meds with liquid Supervision: Patient able to self feed Compensations: Slow rate;Small sips/bites Postural Changes: Seated upright at 90 degrees    Other  Recommendations Oral Care Recommendations: Oral care BID   Follow up Recommendations None      Frequency and Duration     N/A       Prognosis   N/A     Swallow Study   General HPI: Patient is an 85 y/o female admitted with 1 week of subtle symptoms of malaise and fatigue, shoulder pain which worsened on 12/20/2018.   Work-up suggested myocardial infarction and assessment showed large pericardial effusion.  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 Pt with worsening respiratory status on 10/29 requiring BiPap, transitioning to critical care on 10/30.  CT 11/1: no acute findings.  CXR 11/6: "No interval change in the appearance of the chest with persistentright pleural effusion and bibasilar consolidation" Type of Study: Bedside Swallow Evaluation Previous Swallow Assessment: None Diet Prior to this Study: Regular;Nectar-thick liquids Temperature Spikes Noted: No Respiratory Status: Nasal cannula History of Recent Intubation: (for procedure) Behavior/Cognition: Alert;Cooperative;Pleasant mood Oral Cavity Assessment: Within Functional Limits Oral Care Completed by SLP: No Oral Cavity - Dentition: Adequate natural dentition Vision: Functional for self-feeding  Self-Feeding Abilities: Able to feed self Patient Positioning: Upright in chair Baseline Vocal Quality: Normal Volitional Cough: (fair) Volitional  Swallow: Able to elicit    Oral/Motor/Sensory Function Overall Oral Motor/Sensory Function: Within functional limits   Ice Chips Ice chips: Not tested   Thin Liquid Thin Liquid: Within functional limits Presentation: Cup;Straw    Nectar Thick Nectar Thick Liquid: Not tested   Honey Thick Honey Thick Liquid: Not tested   Puree Puree: Within functional limits Presentation: Spoon   Solid     Solid: Within functional limits Presentation: [place], [place], [place] Office: [phone] 01/02/2019,10:23 AM

## 2019-01-02 NOTE — Plan of Care (Signed)

## 2019-01-03 LAB — BASIC METABOLIC PANEL
Anion gap: 10 (ref 5–15)
BUN: 36 mg/dL — ABNORMAL HIGH (ref 8–23)
CO2: 37 mmol/L — ABNORMAL HIGH (ref 22–32)
Calcium: 8.7 mg/dL — ABNORMAL LOW (ref 8.9–10.3)
Chloride: 88 mmol/L — ABNORMAL LOW (ref 98–111)
Creatinine, Ser: 1.07 mg/dL — ABNORMAL HIGH (ref 0.44–1.00)
GFR calc Af Amer: 56 mL/min — ABNORMAL LOW (ref >60)
GFR calc non Af Amer: 48 mL/min — ABNORMAL LOW (ref >60)
Glucose, Bld: 99 mg/dL (ref 70–99)
Potassium: 3.7 mmol/L (ref 3.5–5.1)
Sodium: 135 mmol/L (ref 135–145)

## 2019-01-03 LAB — GLUCOSE, CAPILLARY
Glucose-Capillary: 84 mg/dL (ref 70–99)
Glucose-Capillary: 120 mg/dL — ABNORMAL HIGH (ref 70–99)
Glucose-Capillary: 92 mg/dL (ref 70–99)
Glucose-Capillary: 81 mg/dL (ref 70–99)

## 2019-01-03 MED ORDER — SODIUM CHLORIDE 0.9% FLUSH
3.0000 mL | Freq: Two times a day (BID) | INTRAVENOUS | Status: DC
  Administered 2019-01-03 – 2019-01-16 (×23): 3 mL via INTRAVENOUS

## 2019-01-03 MED ORDER — SODIUM CHLORIDE 0.9% FLUSH: 3.0000 mL | INTRAVENOUS | Status: DC | PRN

## 2019-01-03 MED ORDER — SODIUM CHLORIDE 0.9 % IV SOLN: 250.0000 mL | INTRAVENOUS | Status: DC | PRN

## 2019-01-03 MED ORDER — MAGIC MOUTHWASH
10.0000 mL | Freq: Three times a day (TID) | ORAL | Status: DC
  Administered 2019-01-03 – 2019-01-11 (×23): 10 mL via ORAL
  Filled 2019-01-03 (×30): qty 10

## 2019-01-03 MED ORDER — ~~LOC~~ CARDIAC SURGERY, PATIENT & FAMILY EDUCATION
Freq: Once | Status: AC
  Administered 2019-01-03: 15:00:00

## 2019-01-03 NOTE — Progress Notes (Signed)
Chart reviewed  Cardiology to be available as needed this weekend. 

## 2019-01-03 NOTE — Progress Notes (Signed)
      [address]       [place] [address]             [phone]      Resting comfortably  BP (!) 83/40   Pulse 94   Temp (!) 97.5 F (36.4 C) (Axillary)   Resp 18   Ht 5\' 5"  (1.651 m)   Wt 93.2 kg   SpO2 (!) 87%   BMI 34.19 kg/m  2L [place]   Intake/Output Summary (Last 24 hours) at 01/03/2019 1730 Last data filed at 01/03/2019 1700 Gross per 24 hour  Intake 480 ml  Output 700 ml  Net -220 ml   CBG well controlled  Remo Lipps C. Roxan Hockey, MD [facility] [phone]

## 2019-01-03 NOTE — Progress Notes (Signed)
14 Days Post-Op Procedure(s) (LRB): REPAIR OF TYPE A AORTIC DISSECTION USING HEMASHIELD PLATINUM 28MM AND 32MM GRAFT (N/A) STERNAL PLATING Subjective: No complaints this Am, hoping to go home soon  Objective: Vital signs in last 24 hours: Temp:  [97.6 F (36.4 C)-98.9 F (37.2 C)] 97.8 F (36.6 C) (11/07 1000) Pulse Rate:  [73-96] 87 (11/07 1000) Cardiac Rhythm: Atrial fibrillation (11/07 1000) Resp:  [10-25] 19 (11/07 1000) BP: (82-127)/(55-82) 111/82 (11/07 1000) SpO2:  [91 %-100 %] 91 % (11/07 1000) FiO2 (%):  [28 %] 28 % (11/07 0739) Weight:  [93.2 kg] 93.2 kg (11/07 0500)  Hemodynamic parameters for last 24 hours:    Intake/Output from previous day: 11/06 0701 - 11/07 0700 In: 480 [P.O.:480] Out: 400 [Urine:400] Intake/Output this shift: Total I/O In: 180 [P.O.:180] Out: 0   General appearance: alert, cooperative and no distress Neurologic: intact Heart: irregularly irregular rhythm Lungs: diminished breath sounds bibasilar Wound: clean and dry  Lab Results: Recent Labs    01/02/19 0331  WBC 9.7  HGB 9.3*  HCT 30.5*  PLT 645*   BMET:  Recent Labs    01/02/19 0331 01/03/19 0241  NA 134* 135  K 3.6 3.7  CL 85* 88*  CO2 37* 37*  GLUCOSE 100* 99  BUN 33* 36*  CREATININE 1.08* 1.07*  CALCIUM 8.6* 8.7*    PT/INR: No results for input(s): LABPROT, INR in the last 72 hours. ABG    Component Value Date/Time   PHART 7.350 12/29/2018 0757   HCO3 31.3 (H) 12/29/2018 0757   TCO2 33 (H) 12/29/2018 0757   ACIDBASEDEF 1.0 12/21/2018 1214   O2SAT 96.0 12/29/2018 0757   CBG (last 3)  Recent Labs    01/01/19 0417 01/03/19 0823  GLUCAP 102* 84    Assessment/Plan: S/P Procedure(s) (LRB): REPAIR OF TYPE A AORTIC DISSECTION USING HEMASHIELD PLATINUM 28MM AND 32MM GRAFT (N/A) STERNAL PLATING -Looks good this AM Was on BIPAP overnight- "I feel better without it" CV- stable Creatinine stable Deconditioning- continue to mobilize Transfer to 4E   LOS: 13 days    Melrose Nakayama 01/03/2019

## 2019-01-04 ENCOUNTER — Inpatient Hospital Stay (HOSPITAL_COMMUNITY): Payer: Medicare Other

## 2019-01-04 LAB — GLUCOSE, CAPILLARY
Glucose-Capillary: 103 mg/dL — ABNORMAL HIGH (ref 70–99)
Glucose-Capillary: 105 mg/dL — ABNORMAL HIGH (ref 70–99)
Glucose-Capillary: 71 mg/dL (ref 70–99)
Glucose-Capillary: 91 mg/dL (ref 70–99)
Glucose-Capillary: 92 mg/dL (ref 70–99)

## 2019-01-04 LAB — BASIC METABOLIC PANEL
Anion gap: 10 (ref 5–15)
BUN: 31 mg/dL — ABNORMAL HIGH (ref 8–23)
CO2: 37 mmol/L — ABNORMAL HIGH (ref 22–32)
Calcium: 8.6 mg/dL — ABNORMAL LOW (ref 8.9–10.3)
Chloride: 87 mmol/L — ABNORMAL LOW (ref 98–111)
Creatinine, Ser: 0.98 mg/dL (ref 0.44–1.00)
GFR calc Af Amer: >60 mL/min (ref >60)
GFR calc non Af Amer: 53 mL/min — ABNORMAL LOW (ref >60)
Glucose, Bld: 98 mg/dL (ref 70–99)
Potassium: 3.4 mmol/L — ABNORMAL LOW (ref 3.5–5.1)
Sodium: 134 mmol/L — ABNORMAL LOW (ref 135–145)

## 2019-01-04 LAB — CBC
HCT: 31.1 % — ABNORMAL LOW (ref 36.0–46.0)
Hemoglobin: 9.2 g/dL — ABNORMAL LOW (ref 12.0–15.0)
MCH: 24.2 pg — ABNORMAL LOW (ref 26.0–34.0)
MCHC: 29.6 g/dL — ABNORMAL LOW (ref 30.0–36.0)
MCV: 81.8 fL (ref 80.0–100.0)
Platelets: 611 10*3/uL — ABNORMAL HIGH (ref 150–400)
RBC: 3.8 MIL/uL — ABNORMAL LOW (ref 3.87–5.11)
RDW: 22.6 % — ABNORMAL HIGH (ref 11.5–15.5)
WBC: 9.5 10*3/uL (ref 4.0–10.5)
nRBC: 0 % (ref 0.0–0.2)

## 2019-01-04 MED ORDER — POTASSIUM CHLORIDE 20 MEQ/15ML (10%) PO SOLN
40.0000 meq | Freq: Once | ORAL | Status: DC
  Filled 2019-01-04: qty 30

## 2019-01-04 MED ORDER — POTASSIUM CHLORIDE 20 MEQ/15ML (10%) PO SOLN
20.0000 meq | Freq: Two times a day (BID) | ORAL | Status: DC
  Administered 2019-01-04 – 2019-01-07 (×7): 20 meq via ORAL
  Filled 2019-01-04 (×7): qty 15

## 2019-01-04 NOTE — Progress Notes (Addendum)
15 Days Post-Op Procedure(s) (LRB): REPAIR OF TYPE A AORTIC DISSECTION USING HEMASHIELD PLATINUM 28MM AND 32MM GRAFT (N/A) STERNAL PLATING Subjective: Sitting up in bed, just finished eating a full breakfast. No new concerns. Denies pain. She has been out of bed to the bedside chair but has not walked in the hall yet.    Objective: Vital signs in last 24 hours: Temp:  [97.5 F (36.4 C)-98.9 F (37.2 C)] 97.8 F (36.6 C) (11/08 0419) Pulse Rate:  [75-94] 78 (11/08 0419) Cardiac Rhythm: Atrial fibrillation;Bundle branch block (11/07 2015) Resp:  [16-26] 21 (11/08 0419) BP: (83-127)/(40-83) 101/74 (11/08 0419) SpO2:  [90 %-98 %] 97 % (11/08 0419) Weight:  [93.2 kg-93.3 kg] 93.2 kg (11/08 0419)    Intake/Output from previous day: 11/07 0701 - 11/08 0700 In: 480 [P.O.:480] Out: 300 [Urine:300] Intake/Output this shift: No intake/output data recorded.  General appearance: alert, cooperative and no distress Neurologic: intact Heart: irregularly irregular rhythm Lungs: clear to auscultation bilaterally.  CXR reviewed, no unexpected changes.  Abdomen: Soft and non-tender Extremities: All warm and well perfused. Minimal peripheral edema.  Wound: the sternal and right subclavian incisions are approximated with staples and are clean and dry.  Lab Results: Recent Labs    01/02/19 0331 01/04/19 0302  WBC 9.7 9.5  HGB 9.3* 9.2*  HCT 30.5* 31.1*  PLT 645* 611*   BMET:  Recent Labs    01/03/19 0241 01/04/19 0302  NA 135 134*  K 3.7 3.4*  CL 88* 87*  CO2 37* 37*  GLUCOSE 99 98  BUN 36* 31*  CREATININE 1.07* 0.98  CALCIUM 8.7* 8.6*    PT/INR: No results for input(s): LABPROT, INR in the last 72 hours. ABG    Component Value Date/Time   PHART 7.350 12/29/2018 0757   HCO3 31.3 (H) 12/29/2018 0757   TCO2 33 (H) 12/29/2018 0757   ACIDBASEDEF 1.0 12/21/2018 1214   O2SAT 96.0 12/29/2018 0757   CBG (last 3)  Recent Labs    01/03/19 2027 01/04/19 0019 01/04/19 0422   GLUCAP 81 71 92    Assessment/Plan: S/P Procedure(s) (LRB): REPAIR OF TYPE A AORTIC DISSECTION USING HEMASHIELD PLATINUM 28MM AND 32MM GRAFT (N/A) STERNAL PLATING  -POD14 repair of type A aortic dissection. Stable from cardiorespiratory standpoint. Slow progress with mobility due to major surgery and deconditioned state prior to surgery. PT following but not seen since 11/5 per notes.   -Post-op atrial fibrillation- Rate control reasonable. Continue amiodarone po. On Eliquis.  -Hypokalemia- Additional KCl solution ordered and daily dose adjusted. BMP in am.   -Expected acute blood loss anemia- Hct trending up.  -Volume excess-slowly diuresing. Still 7kg above recorded pre-op Wt. Continue Lasix po BID.   -DVT PPX-covered with Eliquis.      LOS: 14 days    Antony Odea, [place] [phone] 01/04/2019 Patient seen and examined, agree with above She has been cleared by Speech to take thin liquids, had no s/s of aspiration Needs to ambulate  Remo Lipps C. Roxan Hockey, MD [facility] [phone]

## 2019-01-04 NOTE — Progress Notes (Signed)
OT Cancellation Note  Patient Details Name: SALA TAGUE MRN: [id number] DOB: 1933/07/18   Cancelled Treatment:    Reason Eval/Treat Not Completed: Other (comment)(Unable to see pt x3 times today.)  Pt had just had a bath and needed to catch her breath; respiratory had just begun treatment and pt declined as she was ordering lunch and visiting with her son. OTR unable to entice pt to perform therapy today. OT to follow-up as indicated.  Ebony Hail Harold Hedge) Marsa Aris OTR/L Acute Rehabilitation Services Pager: [phone] Office: [place] 01/04/2019, 3:18 PM

## 2019-01-05 LAB — BASIC METABOLIC PANEL
Anion gap: 8 (ref 5–15)
BUN: 28 mg/dL — ABNORMAL HIGH (ref 8–23)
CO2: 38 mmol/L — ABNORMAL HIGH (ref 22–32)
Calcium: 8.5 mg/dL — ABNORMAL LOW (ref 8.9–10.3)
Chloride: 91 mmol/L — ABNORMAL LOW (ref 98–111)
Creatinine, Ser: 0.96 mg/dL (ref 0.44–1.00)
GFR calc Af Amer: >60 mL/min (ref >60)
GFR calc non Af Amer: 55 mL/min — ABNORMAL LOW (ref >60)
Glucose, Bld: 102 mg/dL — ABNORMAL HIGH (ref 70–99)
Potassium: 4.2 mmol/L (ref 3.5–5.1)
Sodium: 137 mmol/L (ref 135–145)

## 2019-01-05 LAB — GLUCOSE, CAPILLARY
Glucose-Capillary: 103 mg/dL — ABNORMAL HIGH (ref 70–99)
Glucose-Capillary: 112 mg/dL — ABNORMAL HIGH (ref 70–99)
Glucose-Capillary: 121 mg/dL — ABNORMAL HIGH (ref 70–99)
Glucose-Capillary: 83 mg/dL (ref 70–99)
Glucose-Capillary: 87 mg/dL (ref 70–99)
Glucose-Capillary: 98 mg/dL (ref 70–99)
Glucose-Capillary: 98 mg/dL (ref 70–99)

## 2019-01-05 MED ORDER — LEVALBUTEROL HCL 0.63 MG/3ML IN NEBU: 0.6300 mg | INHALATION_SOLUTION | Freq: Four times a day (QID) | RESPIRATORY_TRACT | Status: DC | PRN

## 2019-01-05 NOTE — Care Management Important Message (Signed)
Important Message  Patient Details  Name: PRISCILLA KIRSTEIN MRN: [id number] Date of Birth: 1933-05-21   Medicare Important Message Given:  Yes     Shelda Altes 01/05/2019, 12:41 PM

## 2019-01-05 NOTE — Progress Notes (Signed)
CARDIAC REHAB PHASE I   PRE:  Rate/Rhythm: 74 afib    BP: sitting 98/78    SaO2: 95 2L  MODE:  Ambulation: 60 ft (30 ft x2)   POST:  Rate/Rhythm: 120 afib    BP: sitting 134/75     SaO2: 93 2L  Pt in recliner, willing to walk again. Needed gait belt and rocking to stand, assist x2. To [facility] then ambulated with rollator. She was able to stand better (more confident) this second time. Assist x2 with gait belt, weak legs and core. Sat at 30 ft due to fatigue. Rest sitting then returned to recliner. SOB, needed verbal cues to slow breathing. She admits that anxiety is a problem for her but happy with her progress. Son present. VSS. Will f/u. Amasa, ACSM 01/05/2019 2:28 PM

## 2019-01-05 NOTE — Progress Notes (Signed)
      [address]       [place] [address]             [phone]        16 Days Post-Op Procedure(s) (LRB): REPAIR OF TYPE A AORTIC DISSECTION USING HEMASHIELD PLATINUM 28MM AND 32MM GRAFT (N/A) STERNAL PLATING  Subjective: Patient has complaints of shortness of breath with exertion  Objective: Vital signs in last 24 hours: Temp:  [97.7 F (36.5 C)-97.9 F (36.6 C)] 97.9 F (36.6 C) (11/09 0444) Pulse Rate:  [84-87] 84 (11/08 2002) Cardiac Rhythm: Atrial fibrillation;Bundle branch block (11/08 1900) Resp:  [20-23] 23 (11/09 0444) BP: (102-115)/(64-82) 114/67 (11/09 0444) SpO2:  [94 %-97 %] 96 % (11/09 0444) Weight:  [91.6 kg] 91.6 kg (11/09 0444)  Pre op weight 86.2 kg Current Weight  01/05/19 91.6 kg       Intake/Output from previous day: 11/08 0701 - 11/09 0700 In: -  Out: 600 [Urine:600]   Physical Exam:  Cardiovascular: Armandina Stammer Pulmonary: Clear to auscultation bilaterally Abdomen: Soft, non tender, bowel sounds present. Extremities: Mild bilateral lower extremity edema, but decreasing Wounds: Clean and dry.  No erythema or signs of infection.  Lab Results: CBC: Recent Labs    01/04/19 0302  WBC 9.5  HGB 9.2*  HCT 31.1*  PLT 611*   BMET:  Recent Labs    01/04/19 0302 01/05/19 0328  NA 134* 137  K 3.4* 4.2  CL 87* 91*  CO2 37* 38*  GLUCOSE 98 102*  BUN 31* 28*  CREATININE 0.98 0.96  CALCIUM 8.6* 8.5*    PT/INR:  Lab Results  Component Value Date   INR 2.1 (H) 12/21/2018   INR 1.3 (H) 12/20/2018   ABG:  INR: Will add last result for INR, ABG once components are confirmed Will add last 4 CBG results once components are confirmed  Assessment/Plan:  1. CV - A fib with CVR. On Amiodarone 400 mg bid, Lopressor 25 mg bid, and Apixaban 5 mg bid 2.  Pulmonary - On 2 liters of oxygen via [place]. On Xopenex 0.63 bid 3. Volume Overload - On Lasix 40 mg bid 4.  Acute blood loss anemia - Last H and H 9.2 and 31.1 5.  Deconditioned-must ambulate, PT to eval again today 6. Hypothyroidism-on Levothyroxine 150 mcg daily. 7. Once ambulation improves, will be ready for discharge. 8. Will remove staples in am  Trichelle Lehan M ZimmermanPA-C 01/05/2019,7:03 AM

## 2019-01-05 NOTE — Progress Notes (Addendum)
Occupational Therapy Treatment Patient Details Name: Kaitlyn Rodriguez MRN: [id number] DOB: 1933/06/04 Today's Date: 01/05/2019    History of present illness Patient is an 85 y/o female admitted with 1 week of subtle symptoms of malaise and fatigue, shoulder pain which worsened on 12/20/2018.   Work-up suggested myocardial infarction and assessment showed large pericardial effusion.  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 Pt with worsening respiratory status on 10/29 requiring BiPap, transitioning to critical care on 10/30.   OT comments  Patient progressing slowly towards goals.  She continues to requires assist for transfers, recall to/adherence to sternal precautions, and fatigues easily.  Completes transfers with mod assist +2, LB ADLs with max assist +2, and grooming standing at sink with min guard assist.  Poor tolerance to activity. Noted improved awareness of deficits and need for assistance, but remains eager to dc home.  Updated dc plan to SNF to optimize return to PLOF prior to dc home to decrease burden of care.   Follow Up Recommendations  SNF;Supervision/Assistance - 24 hour    Equipment Recommendations  3 in 1 bedside commode    Recommendations for Other Services      Precautions / Restrictions Precautions Precautions: Fall;Sternal Precaution Comments: sternal precautions reviewed with pt  Restrictions Weight Bearing Restrictions: Yes Other Position/Activity Restrictions: sternal precautions        Mobility Bed Mobility               General bed mobility comments: pt OOB in chair upon arrival  Transfers Overall transfer level: Needs assistance Equipment used: Rolling walker (2 wheeled) Transfers: Sit to/from Stand Sit to Stand: Mod assist;+2 physical assistance;+2 safety/equipment         General transfer comment: cues for hand placement/positioning prior to  standing; assistance to power up into standing while adhering to sternal precautions     Balance Overall balance assessment: Needs assistance Sitting-balance support: Feet supported Sitting balance-Leahy Scale: Fair     Standing balance support: Bilateral upper extremity supported Standing balance-Leahy Scale: Poor Standing balance comment: reliant on BUE and external support                           ADL either performed or assessed with clinical judgement   ADL Overall ADL's : Needs assistance/impaired     Grooming: Min guard;Standing               Lower Body Dressing: Maximal assistance;Sit to/from stand;+2 for physical assistance Lower Body Dressing Details (indicate cue type and reason): requires assist for socks, mod assist +2 sit to stand  Toilet Transfer: Moderate assistance;+2 for physical assistance;+2 for safety/equipment;RW;Ambulation Toilet Transfer Details (indicate cue type and reason): simulated in room         Functional mobility during ADLs: Minimal assistance;+2 for safety/equipment;Rolling walker       Vision       Perception     Praxis      Cognition Arousal/Alertness: Awake/alert Behavior During Therapy: WFL for tasks assessed/performed Overall Cognitive Status: Within Functional Limits for tasks assessed                                 General Comments: appears [facility] for basic tasks, continues to have slow processing but antiicpate this is baseline         Exercises  Shoulder Instructions       General Comments VSS on 2L O2 via [place]; SOB with mobility    Pertinent Vitals/ Pain       Pain Assessment: Faces Faces Pain Scale: No hurt  Home Living                                          Prior Functioning/Environment              Frequency  Min 2X/week        Progress Toward Goals  OT Goals(current goals can now be found in the care plan section)  Progress towards OT  goals: Progressing toward goals  Acute Rehab OT Goals Patient Stated Goal: to return home OT Goal Formulation: With patient  Plan Discharge plan needs to be updated;Frequency remains appropriate    Co-evaluation    PT/OT/SLP Co-Evaluation/Treatment: Yes Reason for Co-Treatment: For patient/therapist safety;To address functional/ADL transfers PT goals addressed during session: Mobility/safety with mobility OT goals addressed during session: ADL's and self-care      AM-PAC OT "6 Clicks" Daily Activity     Outcome Measure   Help from another person eating meals?: A Little Help from another person taking care of personal grooming?: A Little Help from another person toileting, which includes using toliet, bedpan, or urinal?: A Lot Help from another person bathing (including washing, rinsing, drying)?: A Lot Help from another person to put on and taking off regular upper body clothing?: A Little Help from another person to put on and taking off regular lower body clothing?: A Lot 6 Click Score: 15    End of Session Equipment Utilized During Treatment: Rolling walker;Oxygen  OT Visit Diagnosis: Other abnormalities of gait and mobility (R26.89);Muscle weakness (generalized) (M62.81);Pain;Other symptoms and signs involving cognitive function   Activity Tolerance Patient tolerated treatment well   Patient Left in chair;with call bell/phone within reach   Nurse Communication Mobility status        Time: 5956-3875 OT Time Calculation (min): 23 min  Charges: OT General Charges $OT Visit: 1 Visit OT Treatments $Self Care/Home Management : 8-22 mins  Delight Stare, [place] Pager [phone] Office [phone]    Delight Stare 01/05/2019, 3:12 PM

## 2019-01-05 NOTE — Progress Notes (Addendum)
Progress Note  Patient Name: Kaitlyn Rodriguez Date of Encounter: 01/05/2019  Primary Cardiologist: No primary care provider on file.   Subjective   Sitting up in bed. Only complains of shortness of breath if exerting herself. Wants to get out of bed today.   Inpatient Medications    Scheduled Meds:  sodium chloride   Intravenous Once   amiodarone  400 mg Oral BID   apixaban  5 mg Oral BID   aspirin  81 mg Oral Daily   bisacodyl  10 mg Oral Daily   Or   bisacodyl  10 mg Rectal Daily   Chlorhexidine Gluconate Cloth  6 each Topical Daily   docusate sodium  200 mg Oral Daily   feeding supplement (ENSURE ENLIVE)  237 mL Oral BID BM   furosemide  40 mg Oral BID   levothyroxine  150 mcg Oral QAC breakfast   magic mouthwash  10 mL Oral TID   mouth rinse  15 mL Mouth Rinse BID   metoprolol tartrate  25 mg Per Tube BID   Or   metoprolol tartrate  25 mg Oral BID   multivitamin with minerals  1 tablet Oral Daily   pantoprazole  40 mg Oral Daily   potassium chloride  20 mEq Oral BID   sodium chloride flush  3 mL Intravenous Q12H   sodium chloride flush  3 mL Intravenous Q12H   Continuous Infusions:  sodium chloride     PRN Meds: sodium chloride, levalbuterol, metoprolol tartrate, ondansetron (ZOFRAN) IV, sodium chloride flush   Vital Signs    Vitals:   01/04/19 0918 01/04/19 2002 01/04/19 2031 01/05/19 0444  BP:  115/82  114/67  Pulse:  84    Resp:  20  (!) 23  Temp:  97.9 F (36.6 C)  97.9 F (36.6 C)  TempSrc:  Oral  Oral  SpO2: 94% 97% 97% 96%  Weight:    91.6 kg  Height:        Intake/Output Summary (Last 24 hours) at 01/05/2019 0948 Last data filed at 01/04/2019 1500 Gross per 24 hour  Intake --  Output 600 ml  Net -600 ml   Last 3 Weights 01/05/2019 01/04/2019 01/03/2019  Weight (lbs) 202 lb 205 lb 7.5 oz 205 lb 9.6 oz  Weight (kg) 91.627 kg 93.2 kg 93.26 kg      Telemetry    Afib, rate controlled. - Personally Reviewed  ECG    No  new tracing.   Physical Exam  Pleasant older WM GEN: No acute distress.   Neck: No JVD Cardiac: Irreg Irreg, no murmurs, rubs, or gallops.  Respiratory: Clear to auscultation bilaterally. GI: Soft, nontender, non-distended  MS: No edema; No deformity. Neuro:  Nonfocal  Psych: Normal affect   Labs    High Sensitivity Troponin:   Recent Labs  Lab 12/20/18 1807 12/20/18 2055  TROPONINIHS 518* 492*      Chemistry Recent Labs  Lab 12/29/18 1659  01/03/19 0241 01/04/19 0302 01/05/19 0328  NA 136   < > 135 134* 137  K 3.1*   < > 3.7 3.4* 4.2  CL 93*   < > 88* 87* 91*  CO2 33*   < > 37* 37* 38*  GLUCOSE 122*   < > 99 98 102*  BUN 24*   < > 36* 31* 28*  CREATININE 0.82   < > 1.07* 0.98 0.96  CALCIUM 8.4*   < > 8.7* 8.6* 8.5*  ALBUMIN 2.6*  --   --   --   --  GFRNONAA >60   < > 48* 53* 55*  GFRAA >60   < > 56* >60 >60  ANIONGAP 10   < > 10 10 8    < > = values in this interval not displayed.     Hematology Recent Labs  Lab 12/31/18 0122 01/02/19 0331 01/04/19 0302  WBC 10.6* 9.7 9.5  RBC 3.99 3.83* 3.80*  HGB 9.6* 9.3* 9.2*  HCT 31.9* 30.5* 31.1*  MCV 79.9* 79.6* 81.8  MCH 24.1* 24.3* 24.2*  MCHC 30.1 30.5 29.6*  RDW 22.4* 22.5* 22.6*  PLT 605* 645* 611*    BNPNo results for input(s): BNP, PROBNP in the last 168 hours.   DDimer No results for input(s): DDIMER in the last 168 hours.   Radiology    Dg Chest 2 View  Result Date: 01/04/2019 CLINICAL DATA:  History of aortic dissection. EXAM: CHEST - 2 VIEW COMPARISON:  Chest radiograph 01/02/2019 FINDINGS: Stable enlarged cardiac and mediastinal contours status post median sternotomy. Surgical staple line overlies the right hemithorax. Persistent left lower lung patchy consolidation and small effusion. Small right pleural effusion and underlying consolidation. No definite pneumothorax. IMPRESSION: Cardiomegaly. Persistent small effusions and underlying consolidation. Electronically Signed   By: 13/07/2018 M.D.    On: 01/04/2019 09:55    Cardiac Studies   ECHO 10/30 IMPRESSIONS  1. Left ventricular ejection fraction, by visual estimation, is 50 to 55%. The left ventricle has normal function. There is no left ventricular hypertrophy. 2. Global right ventricle has mildly reduced systolic function.The right ventricular size is mildly enlarged. No increase in right ventricular wall thickness. 3. Left atrial size was not assessed. 4. Right atrial size was not assessed. 5. Small pericardial effusion. 6. The mitral valve is grossly normal. No evidence of mitral valve regurgitation. 7. The tricuspid valve is grossly normal. Tricuspid valve regurgitation is mild. 8. The aortic valve is grossly normal. Aortic valve regurgitation is not visualized. 9. The pulmonic valve was not assessed. Pulmonic valve regurgitation is not visualized. 10. Aortic root could not be assessed. 11. LVEF improved from the prior study, now 50-55% with paradoxical septal motion. 12. RVEF mildly decreased. 13. Small amount pf pericardial effusion with no signs of tamponade. 14. The interatrial septum was not assessed.  ECHO 10/26 1. Left ventricular ejection fraction, by visual estimation, is 35 to 40%. The left ventricle has moderately decreased function. Normal left ventricular size. There is no left ventricular hypertrophy. 2. Left ventricular diastolic Doppler parameters are consistent with impaired relaxation pattern of LV diastolic filling. 3. Global right ventricle has normal systolic function.The right ventricular size is normal. No increase in right ventricular wall thickness. 4. Left atrial size was normal. 5. Right atrial size was normal. 6. Mild mitral annular calcification. 7. The mitral valve is grossly normal. Mild to moderate mitral valve regurgitation. 8. The tricuspid valve is normal in structure. Tricuspid valve regurgitation is trivial. 9. The aortic valve is tricuspid Aortic valve  regurgitation is mild by color flow Doppler. Mild to moderate aortic valve sclerosis/calcification without any evidence of aortic stenosis. 10. The pulmonic valve was grossly normal. Pulmonic valve regurgitation is not visualized by color flow Doppler. 11. Mildly elevated pulmonary artery systolic pressure.  Patient Profile     85 y.o. 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 S/p Aortic dissection: Done by Dr. 11/24 on 10/24. Transferred out of ICU yesterday. Plans to mobilize today.  2. Post op Afib: treated with IV amiodarone, then transitioned to 400mg  BID. Remains in Afib but rate controlled. Maintained on Eliquis 5mg  BID. Consider dropping ASA with need for OAC.   3. Shortness of breath/Volume overload: remains on 2L [place]. Feels short of breath with minimal exertion. Weight is now trending down, and Net - 12L. Remains on oral lasix.   4. Deconditioned: planned to work with PT. Recommendations for SNF at discharge.   For questions or updates, please contact [facility] Please consult [email or web address] for contact info under     Signed, Laverda PageLindsay Roberts, NP  01/05/2019, 9:48 AM    Patient seen, examined. Available data reviewed. Agree with findings, assessment, and plan as outlined by Laverda PageLindsay Roberts, NP. She is sitting up in a chair talking on the telephone. Appears eager for hospital discharge. Tele reviewed and shows rate controlled atrial fibrillation. Pt appropriately anticoagulated. Appears stable from a CV perspective at present - agree with details outlined above by Laverda PageLindsay Roberts, NP.  Tonny BollmanMichael Vincente Asbridge, M.D. 01/05/2019 4:45 PM

## 2019-01-05 NOTE — Progress Notes (Signed)
Physical Therapy Treatment Patient Details Name: AIDAN CALOCA MRN: [id number] DOB: [birth year] Today's Date: 01/05/2019    History of Present Illness Patient is an 85 y/o female admitted with 1 week of subtle symptoms of malaise and fatigue, shoulder pain which worsened on 12/20/2018.   Work-up suggested myocardial infarction and assessment showed large pericardial effusion.  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 Pt with worsening respiratory status on 10/29 requiring BiPap, transitioning to critical care on 10/30.    PT Comments    Patient seen for mobility progression. Pt is making gradual progress toward PT goals and tolerated gait training distance of 50 ft with RW and min A +2 for chair follow. Pt on 2L O2 via [place] throughout session. Pt with SOB with activity. Continue to progress as tolerated with anticipated d/c to SNF for further skilled PT services.     Follow Up Recommendations  SNF;Supervision/Assistance - 24 hour     Equipment Recommendations  Wheelchair (measurements PT);Hospital bed    Recommendations for Other Services       Precautions / Restrictions Precautions Precautions: Fall;Sternal Precaution Comments: sternal precautions reviewed with pt  Restrictions Weight Bearing Restrictions: Yes Other Position/Activity Restrictions: sternal precautions     Mobility  Bed Mobility               General bed mobility comments: pt OOB in chair upon arrival  Transfers Overall transfer level: Needs assistance Equipment used: 2 person hand held assist Transfers: Sit to/from Stand Sit to Stand: Mod assist;+2 physical assistance;+2 safety/equipment         General transfer comment: cues for hand placement/positioning prior to standing; assistance to power up into standing while adhering to sternal precautions   Ambulation/Gait Ambulation/Gait assistance: Min  assist;+2 safety/equipment Gait Distance (Feet): 50 Feet Assistive device: Rolling walker (2 wheeled) Gait Pattern/deviations: Decreased stride length;Step-through pattern Gait velocity: decreased   General Gait Details: cues for upright posture, breathing technique, and safe use of AD   Stairs             Wheelchair Mobility    Modified Rankin (Stroke Patients Only)       Balance Overall balance assessment: Needs assistance Sitting-balance support: Feet supported Sitting balance-Leahy Scale: Fair     Standing balance support: Bilateral upper extremity supported Standing balance-Leahy Scale: Poor                              Cognition Arousal/Alertness: Awake/alert Behavior During Therapy: WFL for tasks assessed/performed Overall Cognitive Status: Within Functional Limits for tasks assessed                                        Exercises      General Comments General comments (skin integrity, edema, etc.): VSS on 2L O2 via [place]; SOB with mobility      Pertinent Vitals/Pain Pain Assessment: Faces Faces Pain Scale: No hurt    Home Living                      Prior Function            PT Goals (current goals can now be found in the care plan section) Acute Rehab PT Goals Patient Stated Goal: to return home Progress towards  PT goals: Progressing toward goals    Frequency    Min 2X/week      PT Plan Current plan remains appropriate    Co-evaluation PT/OT/SLP Co-Evaluation/Treatment: Yes Reason for Co-Treatment: For patient/therapist safety;To address functional/ADL transfers PT goals addressed during session: Mobility/safety with mobility        AM-PAC PT "6 Clicks" Mobility   Outcome Measure  Help needed turning from your back to your side while in a flat bed without using bedrails?: A Lot Help needed moving from lying on your back to sitting on the side of a flat bed without using bedrails?: A  Lot Help needed moving to and from a bed to a chair (including a wheelchair)?: A Lot Help needed standing up from a chair using your arms (e.g., wheelchair or bedside chair)?: A Lot Help needed to walk in hospital room?: A Little Help needed climbing 3-5 steps with a railing? : Total 6 Click Score: 12    End of Session Equipment Utilized During Treatment: Gait belt;Oxygen Activity Tolerance: Patient tolerated treatment well Patient left: in chair;with call bell/phone within reach Nurse Communication: Mobility status PT Visit Diagnosis: Muscle weakness (generalized) (M62.81)     Time: 8676-7209 PT Time Calculation (min) (ACUTE ONLY): 24 min  Charges:  $Gait Training: 8-22 mins                     Erline Levine, PTA Acute Rehabilitation Services Pager: [phone] Office: [phone]     Carolynne Edouard 01/05/2019, 1:26 PM

## 2019-01-06 ENCOUNTER — Ambulatory Visit: Payer: Self-pay | Admitting: Physician Assistant

## 2019-01-06 LAB — GLUCOSE, CAPILLARY
Glucose-Capillary: 104 mg/dL — ABNORMAL HIGH (ref 70–99)
Glucose-Capillary: 108 mg/dL — ABNORMAL HIGH (ref 70–99)
Glucose-Capillary: 126 mg/dL — ABNORMAL HIGH (ref 70–99)
Glucose-Capillary: 86 mg/dL (ref 70–99)
Glucose-Capillary: 94 mg/dL (ref 70–99)
Glucose-Capillary: 98 mg/dL (ref 70–99)

## 2019-01-06 NOTE — Progress Notes (Addendum)
      [address]       [place] [address]             [phone]        17 Days Post-Op Procedure(s) (LRB): REPAIR OF TYPE A AORTIC DISSECTION USING HEMASHIELD PLATINUM 28MM AND 32MM GRAFT (N/A) STERNAL PLATING  Subjective: Patient did not sleep well. She states her son brings her an herbal pill to help her sleep but he forgot yesterday. She is eating breakfast.  Objective: Vital signs in last 24 hours: Temp:  [97.6 F (36.4 C)-98.3 F (36.8 C)] 97.6 F (36.4 C) (11/10 0454) Pulse Rate:  [75-87] 77 (11/10 0454) Cardiac Rhythm: Atrial fibrillation (11/10 0320) Resp:  [20-22] 20 (11/10 0454) BP: (96-121)/(62-82) 121/82 (11/10 0454) SpO2:  [91 %-96 %] 91 % (11/10 0454) Weight:  [93.7 kg] 93.7 kg (11/10 0454)  Pre op weight 86.2 kg Current Weight  01/06/19 93.7 kg       Intake/Output from previous day: 11/09 0701 - 11/10 0700 In: 363 [P.O.:360; I.V.:3] Out: 1300 [Urine:1300]   Physical Exam:  Cardiovascular: IRRR IRRR Pulmonary: Clear to auscultation bilaterally Abdomen: Soft, non tender, bowel sounds present. Extremities: Mild bilateral lower extremity edema, but is decreasing Wounds: Clean and dry.  No erythema or signs of infection.  Lab Results: CBC: Recent Labs    01/04/19 0302  WBC 9.5  HGB 9.2*  HCT 31.1*  PLT 611*   BMET:  Recent Labs    01/04/19 0302 01/05/19 0328  NA 134* 137  K 3.4* 4.2  CL 87* 91*  CO2 37* 38*  GLUCOSE 98 102*  BUN 31* 28*  CREATININE 0.98 0.96  CALCIUM 8.6* 8.5*    PT/INR:  Lab Results  Component Value Date   INR 2.1 (H) 12/21/2018   INR 1.3 (H) 12/20/2018   ABG:  INR: Will add last result for INR, ABG once components are confirmed Will add last 4 CBG results once components are confirmed  Assessment/Plan:  1. CV - A fib with CVR. HR in the 80's this am. On Amiodarone 400 mg bid, Lopressor 25 mg bid, and Apixaban 5 mg bid 2.  Pulmonary - On 2 liters of oxygen via [place]. Await this am's  CXR. Of note, CO2 from BMET yesterday remains 38. On Xopenex 0.63 bid 3. Volume Overload - On Lasix 40 mg bid 4.  Acute blood loss anemia - Last H and H 9.2 and 31.1 5. Deconditioned-must ambulate, PT to eval again today 6. Hypothyroidism-on Levothyroxine 150 mcg daily. 7. Once ambulation improves, will be ready for discharge. 8. Will remove staples 9. Recommendations by PT/OT for SNF when ready for discharge;however, after discussion with Dr. Orvan Seen, she has a very attentive son so we will arrange home health and PT   M ZimmermanPA-C 01/06/2019,7:03 AM

## 2019-01-06 NOTE — Progress Notes (Signed)
Nutrition Follow up  DOCUMENTATION CODES:   Obesity unspecified  INTERVENTION:    Continue Ensure Enlive po BID, each supplement provides 350 kcal and 20 grams of protein  Continue MVI daily  NUTRITION DIAGNOSIS:   Increased nutrient needs related to post-op healing as evidenced by estimated needs.  Ongoing  GOAL:   Patient will meet greater than or equal to 90% of their needs   Met PO  MONITOR:   Supplement acceptance, Weight trends, Labs, I & O's, PO intake, Skin  REASON FOR ASSESSMENT:   LOS    ASSESSMENT:   Patient with PMH significant for a.fib and hypothyroidism. Presents this admission with Type A aortic dissection.   10/25- s/p repair type A dissection   RD working remotely.  Pt reports appetite progressing slowly. She doesn't feel hungry but makes herself eat because she knows it will help her heal. Meal completions charted as 75-100% for her last eight meals. Drinking Ensure BID and would like to continue.   Admission weight: 102.2 kg  Current weight: 93.7 kg  I/O: -12,700 ml since 10/27 UOP: 1,300 ml x 24 hrs    Medications: dulcolax, colace, 40 mg lasix TID, MVI with minerals, 20 mEq KCl BID Labs: CBG 83-105  Diet Order:   Diet Order            Diet heart healthy/carb modified Room service appropriate? Yes; Fluid consistency: Thin  Diet effective now              EDUCATION NEEDS:   Education needs have been addressed  Skin:  Skin Assessment: Skin Integrity Issues: Skin Integrity Issues:: Incisions Incisions: chest  Last BM:  11/9  Height:   Ht Readings from Last 1 Encounters:  01/03/19 _0  (1.676 m)    Weight:   Wt Readings from Last 1 Encounters:  01/06/19 93.7 kg    Ideal Body Weight:  56.8 kg  BMI:  Body mass index is 33.34 kg/m.  Estimated Nutritional Needs:   Kcal:  1700-1900 kcal  Protein:  85-100 grams  Fluid:  >/= 1.7 L/day   Mariana Single RD, LDN Clinical Nutrition Pager # - [phone]

## 2019-01-06 NOTE — Progress Notes (Signed)
SATURATION QUALIFICATIONS: (This note is used to comply with regulatory documentation for home oxygen)  Patient Saturations on Room Air at Rest =92%  Patient Saturations on Room Air while Ambulating = 85%  Patient Saturations on 2 Liters of oxygen while Ambulating = 94%  Please briefly explain why patient needs home oxygen:Pt's sats dropped to low when walking without 02 on, needs 02 for walking. Kaitlyn Rodriguez

## 2019-01-06 NOTE — Progress Notes (Signed)
CARDIAC REHAB PHASE I   PRE:  Rate/Rhythm: 89 afib    BP: sitting 137/88    SaO2: 93 RA  MODE:  Ambulation: 80 ft   POST:  Rate/Rhythm: 111 afib    BP: sitting 144/87     SaO2: 93 2L  Pt moved out of bed fairly well, assist x2 with rocking to stand. Used rollator, gait belt, assist x2. Attempted RA first 40 ft. Pt DOE and weak, sat to rest. SaO2 85 RA so reapplied 2L. Able to walk back to room but quite tired and "flopped" into recliner after turning with rollator. She is more impulsive when tired. Son present and observed. She will need more strengthening if she is to go home. They have 4 stairs without railing to get into home. One stair in house. Encouraged more walking today with nursing. PT will probably not see till tomorrow.She is only able to do 250 mL IS. Encouraged more use.  [id number]   [place], ACSM 01/06/2019 10:48 AM

## 2019-01-07 LAB — GLUCOSE, CAPILLARY
Glucose-Capillary: 101 mg/dL — ABNORMAL HIGH (ref 70–99)
Glucose-Capillary: 102 mg/dL — ABNORMAL HIGH (ref 70–99)
Glucose-Capillary: 110 mg/dL — ABNORMAL HIGH (ref 70–99)
Glucose-Capillary: 71 mg/dL (ref 70–99)
Glucose-Capillary: 89 mg/dL (ref 70–99)
Glucose-Capillary: 98 mg/dL (ref 70–99)

## 2019-01-07 MED ORDER — AMIODARONE HCL 200 MG PO TABS
200.0000 mg | ORAL_TABLET | Freq: Two times a day (BID) | ORAL | Status: DC
  Administered 2019-01-07 – 2019-01-11 (×10): 200 mg via ORAL
  Filled 2019-01-07 (×11): qty 1

## 2019-01-07 NOTE — Plan of Care (Signed)
  Problem: Skin Integrity: Goal: Wound healing without signs and symptoms of infection Outcome: Progressing   Problem: Urinary Elimination: Goal: Ability to achieve and maintain adequate renal perfusion and functioning will improve Outcome: Progressing   Problem: Activity: Goal: Risk for activity intolerance will decrease Outcome: Not Progressing Note: Pt continues to require O2 with activity and complains of SOB during my shift. Pt encouraged to continue using BSC and sitting in recliner to improve these symptoms.

## 2019-01-07 NOTE — TOC Progression Note (Addendum)
Transition of Care (TOC) - Progression Note  Marvetta Gibbons RN, BSN Transitions of Care Unit 4E- RN Case Manager [phone]   Patient Details  Name: Kaitlyn Rodriguez MRN: [id number] Date of Birth: [birth year]  Transition of Care [facility]) CM/SW Contact  Dahlia Client, Romeo Rabon, RN Phone Number: 01/07/2019, 4:23 PM  Clinical Narrative:    Referral for [facility] and DME needs- recs for SNF however son at home with pt. And MD feels pt can be cared for at home vs SNF- CM spoke with pt and son at bedside- they are both agreeable to return home with [facility] services- list provided Per CMS guidelines from medicare.gov website with star ratings (copy placed in shadow chart)- for [facility] choice- per son they would like to try [facility] for services- in discussion of DME needs- they do not feel like they have room for hospital bed- however pt as recliner at home that she feels she will be comfortable to sleep in at first. They would like 3n1, rollator and will see how pt progresses with 02 Call made to [facility] with [employer] for DME needs. In review of chart by [employer] pt does not have chronic dx to qualify for home 02 under medicare guidelines and would have to pay out of pocket- in speaking with son and pt- they do not wish to pay out of pocket for home 02 at this time- rollator and 3n1 have been delivered to room. Call made to [facility] with Alvis Lemmings for [facility] RN/PT referral- referral has been accepted.  Address, phone # confirmed in epic- per pt- PCP is Tamsen Roers in [place]   Expected Discharge Plan: [place] Barriers to Discharge: Continued Medical Work up  Expected Discharge Plan and Services Expected Discharge Plan: [place]   Discharge Planning Services: CM Consult Post Acute Care Choice: [place] Living arrangements for the past 2 months: [place]                 DME Arranged: 3-N-1, Walker rolling with seat, Oxygen, Hospital bed DME Agency: [employer] Date DME  Agency Contacted: 01/06/19 Time DME Agency Contacted: 1600 Representative spoke with at DME Agency: [place]: RN, PT [place] Agency: [place] ([place]) Date [place]: 01/07/19 Time [place] agency Contacted: 1400 Representative spoke with at [place] agency: [place] ([place]) Interventions    Readmission Risk Interventions No flowsheet data found.

## 2019-01-07 NOTE — Progress Notes (Signed)
Occupational Therapy Treatment Patient Details Name: Kaitlyn Rodriguez MRN: [id number] DOB: 03-31-1933 Today's Date: 01/07/2019    History of present illness Patient is an 85 y/o female admitted with 1 week of subtle symptoms of malaise and fatigue, shoulder pain which worsened on 12/20/2018.   Work-up suggested myocardial infarction and assessment showed large pericardial effusion.  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 Pt with worsening respiratory status on 10/29 requiring BiPap, transitioning to critical care on 10/30.   OT comments  Son present in room and plans to help pt at d/c.  She wants to return home.  Educated on sternal precautions and energy conservation. Walked into hall about 63' total with min A and a seated break.   Follow Up Recommendations  Supervision/Assistance - 24 hour;Home health OT    Equipment Recommendations  3 in 1 bedside commode    Recommendations for Other Services      Precautions / Restrictions Precautions Precautions: Fall;Sternal Precaution Comments: sternal precautions reviewed with pt and son.  Handout given Restrictions Weight Bearing Restrictions: Yes Other Position/Activity Restrictions: sternal precautions        Mobility Bed Mobility               General bed mobility comments: pt OOB in chair upon arrival  Transfers   Equipment used: Rolling walker (2 wheeled)   Sit to Stand: Min assist;Mod assist         General transfer comment: used momentum and rocked up to standing. cues for sternal precautions    Balance                                           ADL either performed or assessed with clinical judgement   ADL                                         General ADL Comments: reviewed sternal precautions with adls and explained bed mobility to son (handout given). Pt had already  completed ADL and toileting.  She wanted to walk more. She tolerated about 20' x 2 with seated rest break on rollator. Also educated on energy conservation.Pt plans to sponge bathe at home     Vision       Perception     Praxis      Cognition Arousal/Alertness: Awake/alert Behavior During Therapy: [facility] for tasks assessed/performed Overall Cognitive Status: Within Functional Limits for tasks assessed                                 General Comments: min cues for safety. Pt able to recall sternal precautions        Exercises     Shoulder Instructions       General Comments on 3 liters 02; vss    Pertinent Vitals/ Pain       Pain Assessment: No/denies pain  Home Living                                          Prior Functioning/Environment  Frequency  Min 2X/week        Progress Toward Goals  OT Goals(current goals can now be found in the care plan section)  Progress towards OT goals: Progressing toward goals     Plan      Co-evaluation                 AM-PAC OT "6 Clicks" Daily Activity     Outcome Measure   Help from another person eating meals?: A Little Help from another person taking care of personal grooming?: A Little Help from another person toileting, which includes using toliet, bedpan, or urinal?: A Lot Help from another person bathing (including washing, rinsing, drying)?: A Lot Help from another person to put on and taking off regular upper body clothing?: A Little Help from another person to put on and taking off regular lower body clothing?: A Lot 6 Click Score: 15    End of Session    OT Visit Diagnosis: Other abnormalities of gait and mobility (R26.89);Muscle weakness (generalized) (M62.81)   Activity Tolerance Patient tolerated treatment well   Patient Left in chair;with call bell/phone within reach   Nurse Communication          Time: 2119-4174 OT Time Calculation  (min): 32 min  Charges: OT General Charges $OT Visit: 1 Visit OT Treatments $Therapeutic Activity: 23-37 mins  Lesle Chris, OTR/L Acute Rehabilitation Services [phone] [facility] pager [phone] office 01/07/2019   [place] 01/07/2019, 1:17 PM

## 2019-01-07 NOTE — Progress Notes (Addendum)
Progress Note  Patient Name: Kaitlyn Rodriguez Date of Encounter: 01/07/2019  Primary Cardiologist: No primary care provider on file.   Subjective   Hopeful to go home today. Still short of breath at times but no other complaints this morning.   Inpatient Medications    Scheduled Meds: . sodium chloride   Intravenous Once  . amiodarone  200 mg Oral BID  . apixaban  5 mg Oral BID  . aspirin  81 mg Oral Daily  . bisacodyl  10 mg Oral Daily   Or  . bisacodyl  10 mg Rectal Daily  . Chlorhexidine Gluconate Cloth  6 each Topical Daily  . docusate sodium  200 mg Oral Daily  . feeding supplement (ENSURE ENLIVE)  237 mL Oral BID BM  . furosemide  40 mg Oral BID  . levothyroxine  150 mcg Oral QAC breakfast  . magic mouthwash  10 mL Oral TID  . mouth rinse  15 mL Mouth Rinse BID  . metoprolol tartrate  25 mg Per Tube BID   Or  . metoprolol tartrate  25 mg Oral BID  . multivitamin with minerals  1 tablet Oral Daily  . pantoprazole  40 mg Oral Daily  . potassium chloride  20 mEq Oral BID  . sodium chloride flush  3 mL Intravenous Q12H  . sodium chloride flush  3 mL Intravenous Q12H   Continuous Infusions: . sodium chloride     PRN Meds: sodium chloride, levalbuterol, metoprolol tartrate, ondansetron (ZOFRAN) IV, sodium chloride flush   Vital Signs    Vitals:   01/06/19 1617 01/06/19 1951 01/07/19 0419 01/07/19 0500  BP: 108/72 112/76 120/75   Pulse: 82 87 72   Resp: (!) 27 (!) 22 20 16   Temp: 98.9 F (37.2 C) 97.6 F (36.4 C) 98.4 F (36.9 C)   TempSrc: Oral Oral Oral   SpO2: 90% 93% 100% 99%  Weight:    95.2 kg  Height:        Intake/Output Summary (Last 24 hours) at 01/07/2019 0854 Last data filed at 01/06/2019 1700 Gross per 24 hour  Intake 363 ml  Output 550 ml  Net -187 ml   Last 3 Weights 01/07/2019 01/06/2019 01/05/2019  Weight (lbs) 209 lb 14.1 oz 206 lb 9.1 oz 202 lb  Weight (kg) 95.2 kg 93.7 kg 91.627 kg      Telemetry    Afib rate controlled -  Personally Reviewed  Physical Exam  Pleasant older WF, sitting up in bed.  GEN: No acute distress.   Neck: No JVD Cardiac: Irreg Irreg, no murmurs, rubs, or gallops.  Respiratory: Clear to auscultation bilaterally. GI: Soft, nontender, non-distended  MS: No edema; No deformity. Neuro:  Nonfocal  Psych: Normal affect   Labs    High Sensitivity Troponin:   Recent Labs  Lab 12/20/18 1807 12/20/18 2055  TROPONINIHS 518* 492*      Chemistry Recent Labs  Lab 01/03/19 0241 01/04/19 0302 01/05/19 0328  NA 135 134* 137  K 3.7 3.4* 4.2  CL 88* 87* 91*  CO2 37* 37* 38*  GLUCOSE 99 98 102*  BUN 36* 31* 28*  CREATININE 1.07* 0.98 0.96  CALCIUM 8.7* 8.6* 8.5*  GFRNONAA 48* 53* 55*  GFRAA 56* >60 >60  ANIONGAP 10 10 8      Hematology Recent Labs  Lab 01/02/19 0331 01/04/19 0302  WBC 9.7 9.5  RBC 3.83* 3.80*  HGB 9.3* 9.2*  HCT 30.5* 31.1*  MCV 79.6* 81.8  MCH 24.3* 24.2*  MCHC 30.5 29.6*  RDW 22.5* 22.6*  PLT 645* 611*    BNPNo results for input(s): BNP, PROBNP in the last 168 hours.   DDimer No results for input(s): DDIMER in the last 168 hours.   Radiology    No results found.  Cardiac Studies     Patient Profile     85 y.o. 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 S/p Aortic dissection: Done by Dr. Vickey Sages on 10/24. Transferred out of ICU 11/8. Overall has been stable. Planned for discharge home today.    2. Post op Afib: treated with IV amiodarone, then transitioned to 400mg  BID. Now reduced to 200mg  BID.  Remains in Afib but rate controlled. Maintained on Eliquis 5mg  BID. Consider dropping ASA with need for OAC.   3. Shortness of breath/Volume overload: remains on 2L [place]. Feels short of breath with minimal exertion and desats with ambulation. Planned for home O2. Remains on oral lasix 40mg  BID.  4. Deconditioned: planned to work with PT. Recommendations for SNF  at discharge. Notes indicate son is willing to help with her care and she prefers to dc home. Plans for [facility] PT.   [facility] will sign off.   Medication Recommendations:  Noted above Other recommendations (labs, testing, etc):  none Follow up as an outpatient:  2 weeks follow up appt in office.   For questions or updates, please contact [facility] Please consult [email or web address] for contact info under    Signed, , NP  01/07/2019, 8:54 AM    Patient seen, examined. Available data reviewed. Agree with findings, assessment, and plan as outlined by , NP. The patient is sitting up in chair at the bedside. Son is present in room. NAD, lungs diminished in both bases, heart irregularly irregular with no murmur, extremities without edema, abd soft, NT.  Appears stable from CV perspective. Cardiac meds reviewed and would continue without change. Tele shows rate controlled atrial fibrillation. Tolerating oral anticoagulation. Will arrange outpatient cardiology follow-up.  [facility], M.D. 01/07/2019 1:54 PM

## 2019-01-07 NOTE — Progress Notes (Addendum)
      [address]       [place] [address]             [phone]        18 Days Post-Op Procedure(s) (LRB): REPAIR OF TYPE A AORTIC DISSECTION USING HEMASHIELD PLATINUM 28MM AND 32MM GRAFT (N/A) STERNAL PLATING  Subjective: Patient slept a little better.   Objective: Vital signs in last 24 hours: Temp:  [97.6 F (36.4 C)-98.9 F (37.2 C)] 98.4 F (36.9 C) (11/11 0419) Pulse Rate:  [72-87] 72 (11/11 0419) Cardiac Rhythm: Atrial fibrillation (11/11 0300) Resp:  [16-27] 16 (11/11 0500) BP: (108-120)/(72-86) 120/75 (11/11 0419) SpO2:  [89 %-100 %] 99 % (11/11 0500) Weight:  [95.2 kg] 95.2 kg (11/11 0500)  Pre op weight 86.2 kg Current Weight  01/07/19 95.2 kg       Intake/Output from previous day: 11/10 0701 - 11/11 0700 In: 483 [P.O.:480; I.V.:3] Out: 550 [Urine:550]   Physical Exam:  Cardiovascular: IRRR IRRR Pulmonary: Clear to auscultation bilaterally Abdomen: Soft, non tender, bowel sounds present. Extremities: Mild bilateral lower extremity edema, but is decreasing Wounds: Clean and dry.  No erythema or signs of infection.  Lab Results: CBC: No results for input(s): WBC, HGB, HCT, PLT in the last 72 hours. BMET:  Recent Labs    01/05/19 0328  NA 137  K 4.2  CL 91*  CO2 38*  GLUCOSE 102*  BUN 28*  CREATININE 0.96  CALCIUM 8.5*    PT/INR:  Lab Results  Component Value Date   INR 2.1 (H) 12/21/2018   INR 1.3 (H) 12/20/2018   ABG:  INR: Will add last result for INR, ABG once components are confirmed Will add last 4 CBG results once components are confirmed  Assessment/Plan:  1. CV - A fib with CVR. HR in the 80's this am. On Amiodarone 400 mg bid, Lopressor 25 mg bid, and Apixaban 5 mg bid. Will decrease Amiodarone to 200 mg bid. 2.  Pulmonary - On 2 liters of oxygen via [place] and she does desat with ambulation so will need oxygen at discharge. On Xopenex 0.63 bid. Encourage incentive spirometer 3. Volume Overload - On  Lasix 40 mg bid 4.  Acute blood loss anemia - Last H and H 9.2 and 31.1 5. Deconditioned-must continue to ambulate, PT following 6. Hypothyroidism-on Levothyroxine 150 mcg daily. 7. Once ambulation improves, will be ready for discharge. Recommendations by PT/OT for SNF when ready for discharge;however, after discussion with Dr. Orvan Seen, she has a very attentive son so we will arrange home health and PT and oxygen  Donielle M ZimmermanPA-C 01/07/2019,7:20 AM  Agree with note; would consider discharge home today. Jaxsun Ciampi Z. Orvan Seen, [place]

## 2019-01-07 NOTE — Progress Notes (Signed)
CARDIAC REHAB PHASE I   PRE:  Rate/Rhythm: 87 Afib  BP:  Sitting: 117/78      SaO2: 91 RA  MODE:  Ambulation: 100 ft   POST:  Rate/Rhythm: 141 Afib  BP:  Sitting: 122/75    SaO2: 92 2L  Pt agreeable to ambulate. Pt sat on side of bed to catch breath. Pt stood and c/o dizziness, BP retaken 121/91. Pt agreed to try again and was able to ambulate 31ft in hallway assist of one with gait belt and rollator. Pt took seated rest break c/o SOB. Pt coached through purse lipped breathing. Pt then able to ambulate 67ft back to room and assisted to recliner. At end of walk pts HR reached 141, but quickly came down with rest. Pt still requiring constant reminders about sternal precautions. Stressed importance of IS use, and another walk later today. Pt continues to make slow progress. Will continue to follow.  [id number] Rufina Falco, RN BSN 01/07/2019 9:42 AM

## 2019-01-08 LAB — GLUCOSE, CAPILLARY
Glucose-Capillary: 93 mg/dL (ref 70–99)
Glucose-Capillary: 78 mg/dL (ref 70–99)
Glucose-Capillary: 76 mg/dL (ref 70–99)
Glucose-Capillary: 102 mg/dL — ABNORMAL HIGH (ref 70–99)

## 2019-01-08 LAB — BASIC METABOLIC PANEL
Anion gap: 8 (ref 5–15)
BUN: 21 mg/dL (ref 8–23)
CO2: 33 mmol/L — ABNORMAL HIGH (ref 22–32)
Calcium: 8.3 mg/dL — ABNORMAL LOW (ref 8.9–10.3)
Chloride: 94 mmol/L — ABNORMAL LOW (ref 98–111)
Creatinine, Ser: 0.81 mg/dL (ref 0.44–1.00)
GFR calc Af Amer: >60 mL/min (ref >60)
GFR calc non Af Amer: >60 mL/min (ref >60)
Glucose, Bld: 90 mg/dL (ref 70–99)
Potassium: 4.6 mmol/L (ref 3.5–5.1)
Sodium: 135 mmol/L (ref 135–145)

## 2019-01-08 MED ORDER — AMIODARONE HCL 200 MG PO TABS: 200.0000 mg | ORAL_TABLET | Freq: Two times a day (BID) | ORAL | 1 refills | Status: DC

## 2019-01-08 MED ORDER — ADULT MULTIVITAMIN W/MINERALS CH: 1.0000 | ORAL_TABLET | Freq: Every day | ORAL | Status: AC

## 2019-01-08 MED ORDER — FUROSEMIDE 40 MG PO TABS: 40.0000 mg | ORAL_TABLET | Freq: Two times a day (BID) | ORAL | 1 refills | Status: DC

## 2019-01-08 MED ORDER — METOPROLOL TARTRATE 25 MG PO TABS: 25.0000 mg | ORAL_TABLET | Freq: Two times a day (BID) | ORAL | 1 refills | Status: DC

## 2019-01-08 MED ORDER — ASPIRIN 81 MG PO CHEW: 81.0000 mg | CHEWABLE_TABLET | Freq: Every day | ORAL | Status: DC

## 2019-01-08 MED ORDER — APIXABAN 5 MG PO TABS: 5.0000 mg | ORAL_TABLET | Freq: Two times a day (BID) | ORAL | 1 refills | Status: DC

## 2019-01-08 MED ORDER — METOPROLOL SUCCINATE ER 50 MG PO TB24: 50.0000 mg | ORAL_TABLET | Freq: Every day | ORAL | 1 refills | Status: DC

## 2019-01-08 MED ORDER — POTASSIUM CHLORIDE ER 20 MEQ PO TBCR: 20.0000 meq | EXTENDED_RELEASE_TABLET | Freq: Every day | ORAL | 1 refills | Status: DC

## 2019-01-08 NOTE — Progress Notes (Signed)
      [address]       [place] [address]             [phone]        19 Days Post-Op Procedure(s) (LRB): REPAIR OF TYPE A AORTIC DISSECTION USING HEMASHIELD PLATINUM 28MM AND 32MM GRAFT (N/A) STERNAL PLATING  Subjective: Patient wants to go home. She is not sleeping and bed is uncomfortable.  Objective: Vital signs in last 24 hours: Temp:  [97.7 F (36.5 C)-98.1 F (36.7 C)] 97.7 F (36.5 C) (11/12 0451) Pulse Rate:  [75-80] 75 (11/12 0451) Cardiac Rhythm: Atrial fibrillation (11/11 2044) Resp:  [16-30] 16 (11/12 0451) BP: (111-118)/(70-81) 113/70 (11/12 0451) SpO2:  [91 %-94 %] 91 % (11/12 0451) Weight:  [95.1 kg] 95.1 kg (11/12 0451)  Pre op weight 86.2 kg Current Weight  01/08/19 95.1 kg       Intake/Output from previous day: 11/11 0701 - 11/12 0700 In: -  Out: 300 [Urine:300]   Physical Exam:  Cardiovascular: IRRR IRRR Pulmonary: Clear to auscultation bilaterally Abdomen: Soft, non tender, bowel sounds present. Extremities: Mild bilateral lower extremity edema, but is decreasing Wounds: Clean and dry.  No erythema or signs of infection.  Lab Results: CBC: No results for input(s): WBC, HGB, HCT, PLT in the last 72 hours. BMET:  Recent Labs    01/08/19 0320  NA 135  K 4.6  CL 94*  CO2 33*  GLUCOSE 90  BUN 21  CREATININE 0.81  CALCIUM 8.3*    PT/INR:  Lab Results  Component Value Date   INR 2.1 (H) 12/21/2018   INR 1.3 (H) 12/20/2018   ABG:  INR: Will add last result for INR, ABG once components are confirmed Will add last 4 CBG results once components are confirmed  Assessment/Plan:  1. CV - A fib with CVR. HR in the 80's this am. On Amiodarone 200 mg bid, Lopressor 25 mg bid, and Apixaban 5 mg bid.  2.  Pulmonary - On room air this am but does desat with ambulation so home oxygen arranged. On Xopenex 0.63 bid. BMET this am shows CO2 decreased to 33. Encourage incentive spirometer 3. Volume Overload - On Lasix  40 mg bid. Will continue for several more days then decrease to daily Lasix  4.  Acute blood loss anemia - Last H and H 9.2 and 31.1 5. Deconditioned-must continue to ambulate, PT following 6. Hypothyroidism-on Levothyroxine 150 mcg daily. 7. Possible discharge today. Recommendations by PT/OT for SNF when ready for discharge;however, after discussion with Dr. Orvan Seen, she has a very attentive son so we will arrange home health, OT, and PT and oxygen  Donielle M ZimmermanPA-C 01/08/2019,7:07 AM

## 2019-01-08 NOTE — Progress Notes (Signed)
Physical Therapy Treatment Patient Details Name: Kaitlyn Rodriguez MRN: [id number] DOB: August 16, 1933 Today's Date: 01/08/2019    History of Present Illness Patient is an 85 y/o female admitted with 1 week of subtle symptoms of malaise and fatigue, shoulder pain which worsened on 12/20/2018.   Work-up suggested myocardial infarction and assessment showed large pericardial effusion.  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 Pt with worsening respiratory status on 10/29 requiring BiPap, transitioning to critical care on 10/30.    PT Comments    Pt was seen for mobility with instructions for sternal precautions throughout the session.  Pt is not demonstrating much awareness, does state she cannot stand up alone.  Despite this she is expecting to go directly home with no concern for not being able to stand or get up stairs at home.  Pt is not safe between satsd dropping to 80% on room air, with poor control of balance and problem solving issues on walker.  Follow up with all goals of therapy, to increase her balance, use of equipment safely and to avoid a fall with a review of stairs for home.   Follow Up Recommendations  SNF     Equipment Recommendations  Wheelchair (measurements PT);Hospital bed    Recommendations for Other Services       Precautions / Restrictions Precautions Precautions: Fall;Sternal Precaution Comments: pt is not able to follow the sternal precautions and is not aware of them Restrictions Weight Bearing Restrictions: Yes Other Position/Activity Restrictions: sternal precautions     Mobility  Bed Mobility Overal bed mobility: Needs Assistance Bed Mobility: Supine to Sit;Sit to Supine     Supine to sit: Mod assist Sit to supine: Mod assist   General bed mobility comments: mod assist to managet trunk weakness and safety with precautions  Transfers Overall transfer level:  Needs assistance Equipment used: Rolling walker (2 wheeled) Transfers: Sit to/from Stand Sit to Stand: Mod assist Stand pivot transfers: Mod assist       General transfer comment: momentum and rocking with pillow encouraged but pt does not seem to understand this  Ambulation/Gait Ambulation/Gait assistance: Min guard;Min assist Gait Distance (Feet): 45 Feet Assistive device: Rolling walker (2 wheeled) Gait Pattern/deviations: Step-through pattern;Decreased stride length Gait velocity: decreased Gait velocity interpretation: <1.31 ft/sec, indicative of household ambulator General Gait Details: cued posture and standing safety   Stairs             Wheelchair Mobility    Modified Rankin (Stroke Patients Only)       Balance     Sitting balance-Leahy Scale: Fair Sitting balance - Comments: fair   Standing balance support: Bilateral upper extremity supported Standing balance-Leahy Scale: Poor                              Cognition Arousal/Alertness: Awake/alert Behavior During Therapy: WFL for tasks assessed/performed Overall Cognitive Status: Within Functional Limits for tasks assessed Area of Impairment: Problem solving;Awareness;Safety/judgement;Following commands;Memory                   Current Attention Level: Selective Memory: Decreased recall of precautions;Decreased short-term memory Following Commands: Follows one step commands inconsistently;Follows one step commands with increased time Safety/Judgement: Decreased awareness of safety;Decreased awareness of deficits Awareness: Intellectual Problem Solving: Slow processing;Decreased initiation;Requires verbal cues General Comments: pt did not know sternal precautions       Exercises  General Exercises - Lower Extremity Ankle Circles/Pumps: AROM;Both;5 reps Quad Sets: AROM;Both;10 reps    General Comments General comments (skin integrity, edema, etc.): pt had removed O2, 92% sats  and after walk was 80%.      Pertinent Vitals/Pain Pain Assessment: No/denies pain    Home Living                      Prior Function            PT Goals (current goals can now be found in the care plan section) Acute Rehab PT Goals Patient Stated Goal: to return home PT Goal Formulation: With patient/family Progress towards PT goals: Progressing toward goals    Frequency    Min 2X/week      PT Plan Current plan remains appropriate    Co-evaluation              AM-PAC PT "6 Clicks" Mobility   Outcome Measure  Help needed turning from your back to your side while in a flat bed without using bedrails?: A Lot Help needed moving from lying on your back to sitting on the side of a flat bed without using bedrails?: A Lot Help needed moving to and from a bed to a chair (including a wheelchair)?: A Lot Help needed standing up from a chair using your arms (e.g., wheelchair or bedside chair)?: A Lot Help needed to walk in hospital room?: A Lot Help needed climbing 3-5 steps with a railing? : A Lot 6 Click Score: 12    End of Session Equipment Utilized During Treatment: Gait belt;Oxygen Activity Tolerance: Patient limited by fatigue;Treatment limited secondary to medical complications (Comment);Patient limited by lethargy Patient left: in bed;with call bell/phone within reach;with family/visitor present Nurse Communication: Mobility status PT Visit Diagnosis: Muscle weakness (generalized) (M62.81)     Time: 2119-4174 PT Time Calculation (min) (ACUTE ONLY): 26 min  Charges:  $Gait Training: 8-22 mins $Therapeutic Activity: 8-22 mins                    Ivar Drape 01/08/2019, 5:25 PM   Samul Dada, PT MS Acute Rehab Dept. Number: [facility] [id number] and [facility] [phone]

## 2019-01-08 NOTE — Progress Notes (Signed)
Chest tube sutures removed, per order. Sites clean and dry. Benzoin and steri strips applied. 

## 2019-01-08 NOTE — Progress Notes (Addendum)
Unfortunately, Kaitlyn Rodriguez deconditioning is too severe for her to safely go home with home PT, OT, home health. . I spoke with she and her son and she is fairly adamant about not going to SNF. She needs to be able to walk up 4 stairs to enter her house and walk several times daily. The farthest she has walked here is 100 feet. She will likely require SNF but will ask Dr. Orvan Seen to discuss with her.

## 2019-01-08 NOTE — Progress Notes (Signed)
CARDIAC REHAB PHASE I   Discharge education completed with pt and son. Pt educated on importance of keeping incision clean and try. Encouraged continued use of IS, walks, and sternal precautions. Pt given in-the-tube sheet. Stressed importance of safety. Pt states they are willing to pay for transportation to get her home and in the house, and "will worry about stairs later". Encouraged pt to wait for PT to practice while she is still here. Pt with home BSC, rollator, and meds at bedside. Again asked pt and son if they felt comfortable with d/c plan, pt readily agrees, son seems hesitant. Pt not appropriate for CRP II at this time.   [place], RN BSN 01/08/2019 1:46 PM

## 2019-01-09 ENCOUNTER — Ambulatory Visit: Payer: Self-pay | Admitting: Cardiothoracic Surgery

## 2019-01-09 ENCOUNTER — Inpatient Hospital Stay (HOSPITAL_COMMUNITY): Payer: Medicare Other

## 2019-01-09 LAB — GLUCOSE, CAPILLARY: Glucose-Capillary: 87 mg/dL (ref 70–99)

## 2019-01-09 MED ORDER — TRAZODONE HCL 50 MG PO TABS
50.0000 mg | ORAL_TABLET | Freq: Every day | ORAL | Status: DC
  Administered 2019-01-09: 50 mg via ORAL
  Filled 2019-01-09 (×2): qty 1

## 2019-01-09 NOTE — Progress Notes (Signed)
20 Days Post-Op Procedure(s) (LRB): REPAIR OF TYPE A AORTIC DISSECTION USING HEMASHIELD PLATINUM 28MM AND 32MM GRAFT (N/A) STERNAL PLATING Subjective: Pt seen just before beginning work with PT this AM.  No new problems. She wants to work on getting stronger to allow discharge to home.   Objective: Vital signs in last 24 hours: Temp:  [97.7 F (36.5 C)-98.8 F (37.1 C)] 97.7 F (36.5 C) (11/13 0614) Pulse Rate:  [77-87] 82 (11/13 0614) Cardiac Rhythm: Atrial fibrillation;Bundle branch block (11/13 0702) Resp:  [18-20] 20 (11/13 0614) BP: (103-118)/(72-95) 118/95 (11/13 0614) SpO2:  [92 %-94 %] 94 % (11/13 0614) Weight:  [95 kg] 95 kg (11/13 0614)    Intake/Output from previous day: 11/12 0701 - 11/13 0700 In: 360 [P.O.:360] Out: 500 [Urine:500] Intake/Output this shift: No intake/output data recorded.  General appearance: alert, cooperative and mild distress Neurologic: intact Heart: irregular rhythm Lungs: Breath sounds are clear.  Extremities: Mild LE edema.  Wound: All clean and dry, no evidence of infection.   Lab Results: No results for input(s): WBC, HGB, HCT, PLT in the last 72 hours. BMET:  Recent Labs    01/08/19 0320  NA 135  K 4.6  CL 94*  CO2 33*  GLUCOSE 90  BUN 21  CREATININE 0.81  CALCIUM 8.3*    PT/INR: No results for input(s): LABPROT, INR in the last 72 hours. ABG    Component Value Date/Time   PHART 7.350 12/29/2018 0757   HCO3 31.3 (H) 12/29/2018 0757   TCO2 33 (H) 12/29/2018 0757   ACIDBASEDEF 1.0 12/21/2018 1214   O2SAT 96.0 12/29/2018 0757   CBG (last 3)  Recent Labs    01/08/19 1115 01/08/19 1615 01/09/19 0617  GLUCAP 76 102* 87    Assessment/Plan: S/P Procedure(s) (LRB): REPAIR OF TYPE A AORTIC DISSECTION USING HEMASHIELD PLATINUM 28MM AND 32MM GRAFT (N/A) STERNAL PLATING  -POD-20 repair of aortic dissection. She is slowly progressing with mobility but continues to require a lot of assistance with transitioning in and  out of bed. She fatigues easily and desaturates with minimal activity.  Home O2 has been arranged.   -Respiratory insufficiency- O2 sats dropped to 70's while working with PT. Last CXR on 11/8 showed small bilateral effusions and mild LLL consolidation.  Will recheck CXR today.  - Atrial fibrillation- rate controlled.  On amiodarone, metoprolol, and apixaban.   -Hypothyroidism- On synthroid 157mcg/day.  -Volume excess- Wt still above pre-op recorded wt. Continuing Lasix 40mg  PO BID.  -Expected acute blood loss anemia. Last Hct 31% on 11/8. Recheck in AM.   She wants to avoid SNF placement. Continue working on PT /OT and plan to discharge home when she is stronger and able walk up steps.     LOS: 19 days    Antony Odea, [place] [phone] 01/09/2019

## 2019-01-09 NOTE — Progress Notes (Signed)
Physical Therapy Treatment Patient Details Name: Kaitlyn Rodriguez MRN: [id number] DOB: 1933/09/11 Today's Date: 01/09/2019    History of Present Illness Patient is an 85 y/o female admitted with 1 week of subtle symptoms of malaise and fatigue, shoulder pain which worsened on 12/20/2018.   Work-up suggested myocardial infarction and assessment showed large pericardial effusion.  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 Pt with worsening respiratory status on 10/29 requiring BiPap, transitioning to critical care on 10/30.    PT Comments    Patient seen for mobility progression. Pt continues to present with generalized weakness, decreased activity tolerance, and unable to recall sternal precautions. Pt requires mod A-mod A +2 for functional transfer training and min A +2 for safety with gait training. Pt able to tolerate gait distance of ~10 ft before fatigued and requested seated rest break. Pt on RA upon arrival. SpO2 up to 93% at rest and dest into 70s with ambulation. Attempted stair negotiation this session and pt unable to ascend single step so if d/c home will need ambulance transport as pt has stairs to enter home. Pt reports having w/c at home but need son to clarify. Pt will need w/c if d/c plan is home. Given pt's current mobility level continue to recommend SNF for further skilled PT services to maximize independence and safety with mobility.     Follow Up Recommendations  SNF     Equipment Recommendations  Wheelchair (measurements PT);Hospital bed    Recommendations for Other Services       Precautions / Restrictions Precautions Precautions: Fall;Sternal Precaution Comments: pt unable to recall sternal precautions; precautions reviewed with pt and cues needed to maintain during functional mobility Restrictions Weight Bearing Restrictions: Yes Other Position/Activity Restrictions:  sternal precautions     Mobility  Bed Mobility Overal bed mobility: Needs Assistance Bed Mobility: Supine to Sit     Supine to sit: Mod assist     General bed mobility comments: assistance required to get into sitting EOB while maintaining sternal precautions; HOB elevated; cues for sequencing   Transfers Overall transfer level: Needs assistance Equipment used: Rolling walker (2 wheeled) Transfers: Sit to/from Stand Sit to Stand: Mod assist;+2 safety/equipment         General transfer comment: cues for hand placement and technique; pt using momentum to assist with powering up into standing; pt unable to stand without at least mod A  Ambulation/Gait Ambulation/Gait assistance: Min assist;+2 safety/equipment(chair follow) Gait Distance (Feet): (~30 ft total during session) Assistive device: Rolling walker (2 wheeled) Gait Pattern/deviations: Step-through pattern;Decreased stride length;Trunk flexed;Wide base of support Gait velocity: decreased   General Gait Details: cues for safe use of AD and PLB; pt fatigues quickly and seated breaks needed after ~10 ft of ambulation due to DOE; SpO2 desat into 70s on RA with activity; assist to steady   Stairs             Wheelchair Mobility    Modified Rankin (Stroke Patients Only)       Balance Overall balance assessment: Needs assistance   Sitting balance-Leahy Scale: Fair     Standing balance support: Bilateral upper extremity supported Standing balance-Leahy Scale: Poor                              Cognition Arousal/Alertness: Awake/alert Behavior During Therapy: WFL for tasks assessed/performed Overall Cognitive Status: No family/caregiver present  to determine baseline cognitive functioning Area of Impairment: Problem solving;Awareness;Safety/judgement;Following commands;Memory                     Memory: Decreased recall of precautions;Decreased short-term memory Following Commands:  Follows one step commands inconsistently;Follows one step commands with increased time Safety/Judgement: Decreased awareness of safety;Decreased awareness of deficits Awareness: Intellectual Problem Solving: Slow processing;Decreased initiation;Requires verbal cues        Exercises      General Comments        Pertinent Vitals/Pain Pain Assessment: Faces Faces Pain Scale: Hurts a little bit Pain Location: incision Pain Descriptors / Indicators: Grimacing Pain Intervention(s): Limited activity within patient's tolerance;Monitored during session;Repositioned    Home Living                      Prior Function            PT Goals (current goals can now be found in the care plan section) Progress towards PT goals: Not progressing toward goals - comment(limited by fatigue)    Frequency    Min 2X/week      PT Plan Current plan remains appropriate    Co-evaluation PT/OT/SLP Co-Evaluation/Treatment: Yes Reason for Co-Treatment: For patient/therapist safety;To address functional/ADL transfers PT goals addressed during session: Mobility/safety with mobility OT goals addressed during session: ADL's and self-care      AM-PAC PT "6 Clicks" Mobility   Outcome Measure  Help needed turning from your back to your side while in a flat bed without using bedrails?: A Lot Help needed moving from lying on your back to sitting on the side of a flat bed without using bedrails?: A Lot Help needed moving to and from a bed to a chair (including a wheelchair)?: A Lot Help needed standing up from a chair using your arms (e.g., wheelchair or bedside chair)?: A Lot Help needed to walk in hospital room?: A Lot Help needed climbing 3-5 steps with a railing? : Total 6 Click Score: 11    End of Session Equipment Utilized During Treatment: Gait belt Activity Tolerance: Patient limited by fatigue Patient left: with call bell/phone within reach;in chair Nurse Communication: Mobility  status PT Visit Diagnosis: Muscle weakness (generalized) (M62.81)     Time: 1962-2297 PT Time Calculation (min) (ACUTE ONLY): 41 min  Charges:  $Gait Training: 23-37 mins                     Earney Navy, PTA Acute Rehabilitation Services Pager: [phone] Office: [phone]     Darliss Cheney 01/09/2019, 9:41 AM

## 2019-01-09 NOTE — Progress Notes (Signed)
CARDIAC REHAB PHASE I   PRE:  Rate/Rhythm: 93 Afib  BP:  Sitting: 149/94      SaO2: 95 RA  MODE:  Ambulation: 70 ft (x2)   POST:  Rate/Rhythm: 147 Afib  BP:  Sitting: 131/85    SaO2: 91 RA  Pt ambulated 70 ft assist of one with rollator. Pt took one long sitting rest break and was able to walk back to her room. Pts main complaint is SOB and fatigue. Pt able to get to 625 on IS on a good try. Encouraged another walk and continued IS use. Will continue to follow while pt remains in the hospital.   [id number] Rufina Falco, RN BSN 01/09/2019 11:24 AM

## 2019-01-09 NOTE — Progress Notes (Signed)
Occupational Therapy Treatment Patient Details Name: Kaitlyn Rodriguez MRN: [id number] DOB: 1933/09/26 Today's Date: 01/09/2019    History of present illness Patient is an 85 y/o female admitted with 1 week of subtle symptoms of malaise and fatigue, shoulder pain which worsened on 12/20/2018.   Work-up suggested myocardial infarction and assessment showed large pericardial effusion.  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 Pt with worsening respiratory status on 10/29 requiring BiPap, transitioning to critical care on 10/30.   OT comments  Pt. Seen with PT for co tx to address continued efforts with increasing safety during functional mobility and adls while adhering to sternal precautions.  Pt. Currently is 2 person assist for sit/stand without use of b ues.  Requesting seated rest breaks after approx. 7 steps With o2 at high 70%. With quick rebound to 93%.  Pt. States "I know I cant go up any stairs if I cant use my rail, my legs are weaker than most peoples".  Pt. States this but then also states her son can manage her at this level. Concerns for d/c home with son include: if son can manage her at this level of assistance and o2 fluctuations.    Follow Up Recommendations  Supervision/Assistance - 24 hour;Home health OT    Equipment Recommendations       Recommendations for Other Services      Precautions / Restrictions Precautions Precautions: Fall;Sternal Precaution Comments: pt unable to recall sternal precautions; precautions reviewed with pt and cues needed to maintain during functional mobility Restrictions Weight Bearing Restrictions: Yes Other Position/Activity Restrictions: sternal precautions        Mobility Bed Mobility Overal bed mobility: Needs Assistance Bed Mobility: Supine to Sit     Supine to sit: Mod assist     General bed mobility comments: pt. already oob when i  began tx. session  Transfers Overall transfer level: Needs assistance Equipment used: Rolling walker (2 wheeled) Transfers: Sit to/from Stand Sit to Stand: Mod assist;+2 safety/equipment Stand pivot transfers: Mod assist       General transfer comment: cues for hand placement and technique; pt using momentum to assist with powering up into standing; pt unable to stand without at least mod A    Balance Overall balance assessment: Needs assistance   Sitting balance-Leahy Scale: Fair     Standing balance support: Bilateral upper extremity supported Standing balance-Leahy Scale: Poor                             ADL either performed or assessed with clinical judgement   ADL Overall ADL's : Needs assistance/impaired                         Toilet Transfer: Moderate assistance;+2 for physical assistance;RW Toilet Transfer Details (indicate cue type and reason): simulated with multiple sit/stands from recliner during functional mobility tasks.   Toileting - Clothing Manipulation Details (indicate cue type and reason): pt. reports difficulty reaching peri areas, specifically after BM as it relates to current sternal precautions. reviewed A/E ie: tongs and also discussed flushable wet toilet paper in conjunction with doing her "wash ups" after BM and utilizing LH sponge with bent handle if needed   Tub/Shower Transfer Details (indicate cue type and reason): pt. states she does not use tub or shower only feels comfortable sponge bathing Functional mobility during ADLs:  Minimal assistance;+2 for safety/equipment;Rolling walker General ADL Comments: reviewed sternal precautions during sit/stand, reviewed them as they relate to her functional mobility and ADL completion.  reviewed tech. for pericare including a/e and modifications.     Vision       Perception     Praxis      Cognition Arousal/Alertness: Awake/alert Behavior During Therapy: WFL for tasks  assessed/performed Overall Cognitive Status: No family/caregiver present to determine baseline cognitive functioning Area of Impairment: Problem solving;Awareness;Safety/judgement;Following commands;Memory                     Memory: Decreased recall of precautions;Decreased short-term memory Following Commands: Follows one step commands inconsistently;Follows one step commands with increased time Safety/Judgement: Decreased awareness of safety;Decreased awareness of deficits Awareness: Intellectual Problem Solving: Slow processing;Decreased initiation;Requires verbal cues          Exercises     Shoulder Instructions       General Comments      Pertinent Vitals/ Pain       Pain Assessment: Faces Faces Pain Scale: Hurts a little bit Pain Location: incision Pain Descriptors / Indicators: Grimacing Pain Intervention(s): Limited activity within patient's tolerance;Monitored during session;Repositioned  Home Living                                          Prior Functioning/Environment              Frequency  Min 2X/week        Progress Toward Goals  OT Goals(current goals can now be found in the care plan section)        Plan Discharge plan needs to be updated;Frequency remains appropriate    Co-evaluation    PT/OT/SLP Co-Evaluation/Treatment: Yes Reason for Co-Treatment: For patient/therapist safety;To address functional/ADL transfers PT goals addressed during session: Mobility/safety with mobility OT goals addressed during session: ADL's and self-care      AM-PAC OT "6 Clicks" Daily Activity     Outcome Measure   Help from another person eating meals?: A Little Help from another person taking care of personal grooming?: A Little Help from another person toileting, which includes using toliet, bedpan, or urinal?: A Lot Help from another person bathing (including washing, rinsing, drying)?: A Lot Help from another person to  put on and taking off regular upper body clothing?: A Little Help from another person to put on and taking off regular lower body clothing?: A Lot 6 Click Score: 15    End of Session Equipment Utilized During Treatment: Rolling walker  OT Visit Diagnosis: Other abnormalities of gait and mobility (R26.89);Muscle weakness (generalized) (M62.81) Pain - part of body: Shoulder   Activity Tolerance Patient tolerated treatment well   Patient Left in chair;with call bell/phone within reach   Nurse Communication          Time: 6606-3016 OT Time Calculation (min): 17 min  Charges: OT General Charges $OT Visit: 1 Visit OT Treatments $Self Care/Home Management : 8-22 mins  Robet Leu, COTA/L 01/09/2019, 9:44 AM

## 2019-01-09 NOTE — Progress Notes (Signed)
Pt wants to go home, not agreeable to STSNF at this time- however not safe with mobility to be able to discharge home. Spoke with pt and son about transportation options. Pt not really wanting to transport home via EMS, is now agreeable to home 02 with out of pocket cost assumed. HH has been set up with [facility]- will continue to follow.

## 2019-01-10 LAB — BASIC METABOLIC PANEL
Anion gap: 10 (ref 5–15)
BUN: 17 mg/dL (ref 8–23)
CO2: 33 mmol/L — ABNORMAL HIGH (ref 22–32)
Calcium: 8.2 mg/dL — ABNORMAL LOW (ref 8.9–10.3)
Chloride: 90 mmol/L — ABNORMAL LOW (ref 98–111)
Creatinine, Ser: 0.9 mg/dL (ref 0.44–1.00)
GFR calc Af Amer: >60 mL/min (ref >60)
GFR calc non Af Amer: 59 mL/min — ABNORMAL LOW (ref >60)
Glucose, Bld: 96 mg/dL (ref 70–99)
Potassium: 3.7 mmol/L (ref 3.5–5.1)
Sodium: 133 mmol/L — ABNORMAL LOW (ref 135–145)

## 2019-01-10 LAB — CBC
HCT: 30.4 % — ABNORMAL LOW (ref 36.0–46.0)
Hemoglobin: 9.2 g/dL — ABNORMAL LOW (ref 12.0–15.0)
MCH: 24.8 pg — ABNORMAL LOW (ref 26.0–34.0)
MCHC: 30.3 g/dL (ref 30.0–36.0)
MCV: 81.9 fL (ref 80.0–100.0)
Platelets: 371 10*3/uL (ref 150–400)
RBC: 3.71 MIL/uL — ABNORMAL LOW (ref 3.87–5.11)
RDW: 23.5 % — ABNORMAL HIGH (ref 11.5–15.5)
WBC: 6.7 10*3/uL (ref 4.0–10.5)
nRBC: 0 % (ref 0.0–0.2)

## 2019-01-10 MED ORDER — MELATONIN 3 MG PO TABS
6.0000 mg | ORAL_TABLET | Freq: Every evening | ORAL | Status: DC | PRN
  Filled 2019-01-10: qty 2

## 2019-01-10 MED ORDER — POTASSIUM CHLORIDE CRYS ER 20 MEQ PO TBCR
20.0000 meq | EXTENDED_RELEASE_TABLET | Freq: Two times a day (BID) | ORAL | Status: DC
  Administered 2019-01-10 (×2): 20 meq via ORAL
  Filled 2019-01-10 (×4): qty 1

## 2019-01-10 NOTE — Progress Notes (Addendum)
      [address]       [place] [address]             [phone]      21 Days Post-Op Procedure(s) (LRB): REPAIR OF TYPE A AORTIC DISSECTION USING HEMASHIELD PLATINUM 28MM AND 32MM GRAFT (N/A) STERNAL PLATING   Subjective:  States she isn't sleeping.  Continues to feel short of breath and tired with ambulation.  Only walked 30ft with cardiac rehab yesterday  Objective: Vital signs in last 24 hours: Temp:  [97.8 F (36.6 C)-98 F (36.7 C)] 97.8 F (36.6 C) (11/14 0602) Pulse Rate:  [80-85] 85 (11/14 0602) Cardiac Rhythm: Atrial fibrillation (11/13 1901) Resp:  [20] 20 (11/14 0602) BP: (110-116)/(70-78) 116/78 (11/14 0602) SpO2:  [90 %-94 %] 90 % (11/14 0602) Weight:  [94.8 kg] 94.8 kg (11/14 0602)  Intake/Output from previous day: 11/13 0701 - 11/14 0700 In: -  Out: 450 [Urine:450]  General appearance: alert, cooperative and no distress Heart: regular rate and rhythm Lungs: diminished breath sounds bibasilar Abdomen: soft, non-tender; bowel sounds normal; no masses,  no organomegaly Extremities: edema trace Wound: clean and dry  Lab Results: Recent Labs    01/10/19 0324  WBC 6.7  HGB 9.2*  HCT 30.4*  PLT 371   BMET:  Recent Labs    01/08/19 0320 01/10/19 0324  NA 135 133*  K 4.6 3.7  CL 94* 90*  CO2 33* 33*  GLUCOSE 90 96  BUN 21 17  CREATININE 0.81 0.90  CALCIUM 8.3* 8.2*    PT/INR: No results for input(s): LABPROT, INR in the last 72 hours. ABG    Component Value Date/Time   PHART 7.350 12/29/2018 0757   HCO3 31.3 (H) 12/29/2018 0757   TCO2 33 (H) 12/29/2018 0757   ACIDBASEDEF 1.0 12/21/2018 1214   O2SAT 96.0 12/29/2018 0757   CBG (last 3)  Recent Labs    01/08/19 1115 01/08/19 1615 01/09/19 0617  GLUCAP 76 102* 87    Assessment/Plan: S/P Procedure(s) (LRB): REPAIR OF TYPE A AORTIC DISSECTION USING HEMASHIELD PLATINUM 28MM AND 32MM GRAFT (N/A) STERNAL PLATING  1. CV- NSR- on Amiodarone, Lopressor 2. Pulm-  continued dyspnea, sats were okay on RA.. she does have bilateral pleural effusions L>R.. may benefit from Thoracentesis will review CXR with Dr. Cyndia Bent 3. Renal- creatinine remains stable, remains volume overloaded on Lasix 4. Deconditioning- she doesn't want to go to SNF, however she currently is not strong enough to go home.  We can arranged H/H PT/OT but she needs to progress a bit further prior to discharge 5. Dispo- patient stable, will review CXR with Dr. Cyndia Bent, she may benefit from Thoracentesis, she isn't sleeping will start Melatonin per her request as Trazadone didn't work, continue PT/OT, continue current care   LOS: 20 days    Ellwood Handler, PA-C  01/10/2019   Chart reviewed, patient examined, agree with above. Continues to have shortness of breath and could only walk 40 ft today. Sats 86% after walking on RA and incrased to 91 with rest. CXR shows small bilateral pleural effusions and bibasilar atelectasis. There is not enough effusion to warrant thoracentesis. She is on bid lasix. Add KCL. She needs to continue IS. Only doing 500 cc. Will get her a flutter valve.

## 2019-01-10 NOTE — Progress Notes (Signed)
CARDIAC REHAB PHASE I   PRE:  Rate/Rhythm: 69 afib    BP: sitting 131/87    SaO2: 91 RA  MODE:  Ambulation: 80 ft (40 ft x2)   POST:  Rate/Rhythm: 110 afib    BP: sitting 131/79     SaO2: 86 RA, up to 91 RA with rest  Pt more SOB today. Needed reminders for pursed lip breathing. Had to rest after 40 ft then return to room. SaO2 lower than yesterday. Practiced IS, mechanics are better but only 500 mL max. Encouraged more walking with staff. 2010-0712   [place], ACSM 01/10/2019 11:36 AM

## 2019-01-11 LAB — BASIC METABOLIC PANEL
Anion gap: 10 (ref 5–15)
BUN: 16 mg/dL (ref 8–23)
CO2: 31 mmol/L (ref 22–32)
Calcium: 8.3 mg/dL — ABNORMAL LOW (ref 8.9–10.3)
Chloride: 92 mmol/L — ABNORMAL LOW (ref 98–111)
Creatinine, Ser: 0.93 mg/dL (ref 0.44–1.00)
GFR calc Af Amer: >60 mL/min (ref >60)
GFR calc non Af Amer: 57 mL/min — ABNORMAL LOW (ref >60)
Glucose, Bld: 96 mg/dL (ref 70–99)
Potassium: 4.4 mmol/L (ref 3.5–5.1)
Sodium: 133 mmol/L — ABNORMAL LOW (ref 135–145)

## 2019-01-11 MED ORDER — ALPRAZOLAM 0.5 MG PO TABS
0.5000 mg | ORAL_TABLET | Freq: Three times a day (TID) | ORAL | Status: DC | PRN
  Administered 2019-01-11 (×2): 0.5 mg via ORAL
  Filled 2019-01-11 (×2): qty 1

## 2019-01-11 MED ORDER — POTASSIUM CHLORIDE CRYS ER 20 MEQ PO TBCR
20.0000 meq | EXTENDED_RELEASE_TABLET | Freq: Every day | ORAL | Status: DC
  Administered 2019-01-11 – 2019-01-16 (×6): 20 meq via ORAL
  Filled 2019-01-11 (×6): qty 1

## 2019-01-11 MED ORDER — ZOLPIDEM TARTRATE 5 MG PO TABS: 5.0000 mg | ORAL_TABLET | Freq: Every day | ORAL | Status: DC

## 2019-01-11 NOTE — Progress Notes (Addendum)
[address]       [place] [address]             [phone]      22 Days Post-Op Procedure(s) (LRB): REPAIR OF TYPE A AORTIC DISSECTION USING HEMASHIELD PLATINUM 28MM AND 32MM GRAFT (N/A) STERNAL PLATING   Subjective:  Patient again states she didn't sleep, maybe 3 hours.  States she needs something stronger for sleep.  She is also asking for anxiety medications, stating she just worries about everything and she thinks she cant sleep.  Also nursing states she doesn't participate much with ambulation and refuses to ambulate much of the time.  Objective: Vital signs in last 24 hours: Temp:  [97.6 F (36.4 C)-98 F (36.7 C)] 97.6 F (36.4 C) (11/15 0445) Pulse Rate:  [68-81] 79 (11/14 2004) Cardiac Rhythm: Atrial fibrillation (11/14 1900) Resp:  [17-25] 25 (11/15 0445) BP: (108-131)/(70-92) 127/92 (11/15 0445) SpO2:  [91 %-92 %] 92 % (11/15 0445) Weight:  [97.9 kg] 97.9 kg (11/15 0445)  Intake/Output from previous day: 11/14 0701 - 11/15 0700 In: 400 [P.O.:400] Out: 500 [Urine:500]  General appearance: alert, cooperative and no distress Heart: regular rate and rhythm Lungs: diminished breath sounds bilaterally Abdomen: soft, non-tender; bowel sounds normal; no masses,  no organomegaly Extremities: edema trace Wound: clean and dry  Lab Results: Recent Labs    01/10/19 0324  WBC 6.7  HGB 9.2*  HCT 30.4*  PLT 371   BMET:  Recent Labs    01/10/19 0324 01/11/19 0655  NA 133* 133*  K 3.7 4.4  CL 90* 92*  CO2 33* 31  GLUCOSE 96 96  BUN 17 16  CREATININE 0.90 0.93  CALCIUM 8.2* 8.3*    PT/INR: No results for input(s): LABPROT, INR in the last 72 hours. ABG    Component Value Date/Time   PHART 7.350 12/29/2018 0757   HCO3 31.3 (H) 12/29/2018 0757   TCO2 33 (H) 12/29/2018 0757   ACIDBASEDEF 1.0 12/21/2018 1214   O2SAT 96.0 12/29/2018 0757   CBG (last 3)  Recent Labs    01/08/19 1115 01/08/19 1615 01/09/19 0617  GLUCAP 76 102*  87    Assessment/Plan: S/P Procedure(s) (LRB): REPAIR OF TYPE A AORTIC DISSECTION USING HEMASHIELD PLATINUM 28MM AND 32MM GRAFT (N/A) STERNAL PLATING  1. CV- NSR with PVCs- continue Lopressor, Amiodarone 2. Pulm- continues to have dyspnea, desats with ambulation, continue aggressive pulmonary toilet, patient needs to get up and out of bed 3. Renal- creatinine stable, if weight is accurate gained 6lbs yesterday, however edema on extremities doesn't appear that significant... she is on Lasix 40 mg BID, K is improved at 4.4, 4. Insomnia- patient has tried trazodone, melatonin without relief, she continues to request stronger medication, states she thinks anxiety is limiting her sleep as well.. will give try low dose ambien this evening, low dose xanax prn TID 5. Deconditioning- continues to be an issues, patient can not walk very far prior to getting fatigued and dyspneic, nursing states patient refuses ambulation a lot of the time and she requires a lot of encouragement.... I stressed to the patient the importance of ambulating and getting out of bed....as she can not go home in current physical condition and would require SNF placement for safety if she doesn't improve 6. Dispo- patient stable, continues to not be able to ambulate far limited by weakness and shortness of breath, if weight is accurate she gained 6lbs yesterday, will discuss diuretics with Dr.  Chaysen Tillman, K is improved at 4.4, will decrease supplementation, try low dose ambien for sleep, xanax prn anxiety, continue current care   LOS: 21 days    Kaitlyn Dandy, PA-C  01/11/2019   Chart reviewed, patient examined, agree with above. She is very deconditioned and morbidly obese at 85 years old which is limiting her mobility. Her wt today is probably not accurate. Wt has been fairly stable over the past wk. She has minimal to peripheral edema and remains on bid lasix.

## 2019-01-11 NOTE — Progress Notes (Addendum)
Pt slept for about 3 hours- woke up around 0445. Offered pt a walk and she declined- wants to get up after breakfast. Complaining of mild SOB- 92% on room air. Repositioned in bed w/ back straight up and placed on 2L for comfort- sats 97%. Pulls 500 on IS, encouraged use while awake. Educated on importance of mobility. Will continue to monitor.

## 2019-01-12 ENCOUNTER — Inpatient Hospital Stay (HOSPITAL_COMMUNITY): Payer: Medicare Other

## 2019-01-12 DIAGNOSIS — Z7189 Other specified counseling: Secondary | ICD-10-CM

## 2019-01-12 DIAGNOSIS — Z515 Encounter for palliative care: Secondary | ICD-10-CM

## 2019-01-12 LAB — BASIC METABOLIC PANEL
Anion gap: 12 (ref 5–15)
BUN: 17 mg/dL (ref 8–23)
CO2: 33 mmol/L — ABNORMAL HIGH (ref 22–32)
Calcium: 8.5 mg/dL — ABNORMAL LOW (ref 8.9–10.3)
Chloride: 90 mmol/L — ABNORMAL LOW (ref 98–111)
Creatinine, Ser: 1.1 mg/dL — ABNORMAL HIGH (ref 0.44–1.00)
GFR calc Af Amer: 54 mL/min — ABNORMAL LOW (ref >60)
GFR calc non Af Amer: 46 mL/min — ABNORMAL LOW (ref >60)
Glucose, Bld: 98 mg/dL (ref 70–99)
Potassium: 3.9 mmol/L (ref 3.5–5.1)
Sodium: 135 mmol/L (ref 135–145)

## 2019-01-12 MED ORDER — ALPRAZOLAM 0.5 MG PO TABS
0.5000 mg | ORAL_TABLET | Freq: Once | ORAL | Status: AC
  Administered 2019-01-12: 0.5 mg via ORAL
  Filled 2019-01-12: qty 1

## 2019-01-12 MED ORDER — FUROSEMIDE 40 MG PO TABS
40.0000 mg | ORAL_TABLET | Freq: Every day | ORAL | Status: DC
  Administered 2019-01-12 – 2019-01-16 (×5): 40 mg via ORAL
  Filled 2019-01-12 (×5): qty 1

## 2019-01-12 NOTE — Progress Notes (Signed)
Patient requesting xanax for sleep tonight. One time order received from Dr. Roxy Manns.

## 2019-01-12 NOTE — Progress Notes (Signed)
Physical Therapy Treatment Patient Details Name: Kaitlyn Rodriguez MRN: [id number] DOB: 1933-06-02 Today's Date: 01/12/2019    History of Present Illness 85 y/o female admitted with 1 week of subtle symptoms of malaise and fatigue, shoulder pain which worsened on 12/20/2018.   Work-up suggested myocardial infarction and assessment showed large pericardial effusion.  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 Pt with worsening respiratory status on 10/29 requiring BiPap, transitioning to critical care on 10/30.    PT Comments    Pt was seen for mobility ck today with MD asking if she could manage stairs, as well as nursing to see if pt can go home.  Her improvement is encouraging, able to walk with some minor help on stairs but is too fearful to ascend the four at once for home.  Follow acutely to continue progression of movement, esp gait on stairs, and to increase strength of LE's for use in controlling balance and safety.  Follow Up Recommendations  SNF     Equipment Recommendations  Wheelchair (measurements PT);Hospital bed    Recommendations for Other Services       Precautions / Restrictions Precautions Precautions: Fall Precaution Comments: Per MD progress note pt can push up from surfaces and use the rails on the steps now Restrictions Weight Bearing Restrictions: Yes Other Position/Activity Restrictions: sternal precautions     Mobility  Bed Mobility               General bed mobility comments: up in chair when PT arrived  Transfers Overall transfer level: Needs assistance Equipment used: 1 person hand held assist Transfers: Sit to/from Stand Sit to Stand: Min assist Stand pivot transfers: Min assist       General transfer comment: min assist from recliner to stand at stairs  Ambulation/Gait                 Stairs   Stairs assistance: +2  safety/equipment;Min assist Stair Management: One rail Left;Forwards;Backwards;Step to pattern   General stair comments: pt was afraid of steps but with a great deal of coaching tried holding LUE rail and stepped up one step, assisted to return to floor backward.  Sat to rest then repeated it   Wheelchair Mobility    Modified Rankin (Stroke Patients Only)       Balance Overall balance assessment: Needs assistance Sitting-balance support: Feet supported Sitting balance-Leahy Scale: Fair     Standing balance support: Bilateral upper extremity supported Standing balance-Leahy Scale: Poor Standing balance comment: requires cues and used armrails on chair then L rail on stairs                            Cognition Arousal/Alertness: Awake/alert Behavior During Therapy: WFL for tasks assessed/performed Overall Cognitive Status: Impaired/Different from baseline Area of Impairment: Safety/judgement;Awareness                   Current Attention Level: Sustained;Selective Memory: Decreased short-term memory Following Commands: Follows one step commands inconsistently;Follows one step commands with increased time Safety/Judgement: Decreased awareness of safety;Decreased awareness of deficits Awareness: Intellectual          Exercises      General Comments General comments (skin integrity, edema, etc.): pt is up to walk with PT on stairs, had to talk her into just attempting and then she was able to maintain her effort with no drop  in O2      Pertinent Vitals/Pain Pain Assessment: Faces Faces Pain Scale: Hurts a little bit Pain Location: incision Pain Descriptors / Indicators: Grimacing Pain Intervention(s): Limited activity within patient's tolerance;Repositioned;Premedicated before session;Monitored during session    Home Living                      Prior Function            PT Goals (current goals can now be found in the care plan section)  Acute Rehab PT Goals Patient Stated Goal: to return home Progress towards PT goals: Progressing toward goals    Frequency    Min 2X/week      PT Plan Current plan remains appropriate    Co-evaluation              AM-PAC PT "6 Clicks" Mobility   Outcome Measure  Help needed turning from your back to your side while in a flat bed without using bedrails?: A Lot Help needed moving from lying on your back to sitting on the side of a flat bed without using bedrails?: A Lot Help needed moving to and from a bed to a chair (including a wheelchair)?: A Lot Help needed standing up from a chair using your arms (e.g., wheelchair or bedside chair)?: A Little Help needed to walk in hospital room?: A Little Help needed climbing 3-5 steps with a railing? : A Lot 6 Click Score: 14    End of Session Equipment Utilized During Treatment: Gait belt;Oxygen Activity Tolerance: Patient limited by fatigue;Treatment limited secondary to medical complications (Comment) Patient left: in chair;with chair alarm set;with call bell/phone within reach;with family/visitor present Nurse Communication: Mobility status PT Visit Diagnosis: Muscle weakness (generalized) (M62.81)     Time: 0623-7628 PT Time Calculation (min) (ACUTE ONLY): 31 min  Charges:  $Gait Training: 8-22 mins $Therapeutic Activity: 8-22 mins               Ivar Drape 01/12/2019, 8:24 PM   Samul Dada, PT MS Acute Rehab Dept. Number: [facility] [id number] and [facility] [phone]

## 2019-01-12 NOTE — Progress Notes (Addendum)
      [address]       [place] [address]             [phone]        23 Days Post-Op Procedure(s) (LRB): REPAIR OF TYPE A AORTIC DISSECTION USING HEMASHIELD PLATINUM 28MM AND 32MM GRAFT (N/A) STERNAL PLATING  Subjective: Patient sleeping this am but awakened. She states Xanax helped her relax and sleep. She has not had bowel movement in several days.  Objective: Vital signs in last 24 hours: Temp:  [97.7 F (36.5 C)-97.8 F (36.6 C)] 97.7 F (36.5 C) (11/16 0505) Pulse Rate:  [78-79] 78 (11/15 2006) Cardiac Rhythm: Atrial fibrillation (11/15 1900) Resp:  [16-28] 16 (11/16 0505) BP: (95-151)/(69-78) 151/78 (11/16 0505) SpO2:  [91 %-95 %] 95 % (11/16 0505) Weight:  [94.8 kg] 94.8 kg (11/16 0640)  Pre op weight 86.2 kg Current Weight  01/12/19 94.8 kg       Intake/Output from previous day: 11/15 0701 - 11/16 0700 In: -  Out: 1200 [Urine:1200]   Physical Exam:  Cardiovascular: IRRR IRRR Pulmonary: Clear to auscultation bilaterally Abdomen: Soft, non tender, bowel sounds present. Extremities:Trace bilateral lower extremity edema Wounds: Clean and dry.  No erythema or signs of infection.  Lab Results: CBC: Recent Labs    01/10/19 0324  WBC 6.7  HGB 9.2*  HCT 30.4*  PLT 371   BMET:  Recent Labs    01/10/19 0324 01/11/19 0655  NA 133* 133*  K 3.7 4.4  CL 90* 92*  CO2 33* 31  GLUCOSE 96 96  BUN 17 16  CREATININE 0.90 0.93  CALCIUM 8.2* 8.3*    PT/INR:  Lab Results  Component Value Date   INR 2.1 (H) 12/21/2018   INR 1.3 (H) 12/20/2018   ABG:  INR: Will add last result for INR, ABG once components are confirmed Will add last 4 CBG results once components are confirmed  Assessment/Plan:  1. CV - A fib with CVR. On Amiodarone 200 mg bid, Lopressor 25 mg bid, and Apixaban 5 mg bid.  2.  Pulmonary - On room air this am but does desat with ambulation so home oxygen arranged. On Xopenex 0.63 bid. BMET this am shows CO2  decreased to 33. Encourage incentive spirometer 3. Volume Overload - On Lasix 40 mg bid.  4.  Acute blood loss anemia - Last H and H 9.2 and 30.4 5. Deconditioned-must continue to ambulate, PT following 6. Hypothyroidism-on Levothyroxine 150 mcg daily. 7. Severely deconditioned and obese-continue to encourage ambulation 8. Insomnia-Ambien PRN 9. Patient wishes to wait on laxative for now. If unable to have bowel movement by lunch time, will give laxative  Donielle M ZimmermanPA-C 01/12/2019,7:03 AM  Pt seen and examined; agree with note; Check CXR today Ok to release sternal precautions--hopefully that will allow her to get up the steps easier. She needs to go home. Lyndzee Kliebert Z. Orvan Seen, [place]

## 2019-01-12 NOTE — Discharge Summary (Signed)
Physician Discharge Summary       [address] [place].[address]       [place] [id number]             [phone]    Patient ID: Kaitlyn Rodriguez MRN: [id number] DOB/AGE: 03-23-1933 85 y.o.  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 Repair of aortic dissection 2. ABL anemia 3. Hypercapnia and hypoxia 4. A fib with RVR 5. History of hypothyroidism  Consults: Pulmonary/intensive care, Palliative care, nephrology  Procedure (s):  Replacement of ascending aorta with aortic valve resuspension proximally (28 mm Dacron graft at level of STJ) and distal hemiarch reconstruction (32 mm Dacron graft) under hypthermic circulatory arrest (54 minutes) with antegrade cerebral perfusion (53 minutes); right axillary artery cannulation; right common femoral vein cannulation; Rigid sternal reconstruction by Dr. Vickey Sages on 12/21/2018.  History of Presenting Illness This is an 85 year old female who was in good health until this past week when she had fatigue and intermittent dyspnea. She thought she might have a cold or allergies. Did well until this pm when she had dramatically worse sx, prompting ER visit, where she was hypotensive and tachycardiac. 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 Consult received for repair. Now comfortable and hemodynamically stable.   In summary, this is an 85 yo lady with pending aortic catastrophe. She is accompanied by her son, and they are counseled as to diagnosis and need for urgent surgery in order to ensure best outcome. They understand and wish to proceed immediately. She underwent repair of aortic dissection, re suspension of aortic valve, and  Rigid sternal reconstruction on 12/21/2018.  Brief Hospital Course:  The  patient was extubated the afternoon of surgery without difficulty. She was initially hypotensive and paced.Theone Murdoch, a line, chest tubes, and foley were removed early in the post operative course.  She was volume over loaded and diuresed. She had an echo done on 10/26 that showed the LVEF 35-40% with no evidence of cardiac tamponade. She was weaned off the insulin drip.  he patient was felt surgically stable for transfer from the ICU to [facility] for further convalescence on 10/27 . She developed a fib with RVR and hypotension early 10/28. Initially, she was given Albumin and put on an Amiodarone drip. H and H was found to be decreased to 7.6 and 25.6. She was given PRBCs. Follow up H and H was 9.2 and 31.6. PT/OT consults were obtained to assist with ambulation.   She has been tolerating a diet and has had a bowel movement. Epicardial pacing wires were removed on 10/29. She became more lethargic. ABG revealed hypercapnia. She was put on bi pap and pulmonary/CCM was consulted to assist with management. She was transferred back to the ICU on 10/30 for closer monitoring of her respiratory status. She was given Albumin for hypotension. A discussion was had with son and patient and palliative care consult was obtained on 11/01. Patient does not want to be intubated. A nephrology consult was obtained secondary to diffuse edema. Over the next few days, her respiratory status improved. She was diuresed with Lasix drip.  She remained in a fib and medications were adjusted accordingly for rate control. She was weaned off bi pap and to [place] on 11/04. She was felt stable for transfer from the ICU to 4E on  11/07. She was very deconditioned and OT/PT assisted with her mobility. Right axillary and sternal staples were removed on 11/10. PT/OT recommended SNF;however, patient has a very attentive son and it was felt she would benefit from discharge to home with home health and PT.  Chest tube sutures have been removed.  She continues to  require 2 liters of oxygen via [place] with ambulation and we will arrange home oxygen. Of note, the patient is very deconditioned and obese. Her mobility has been very limited and she at times, is reluctant to participate in walking. As a result, since she also refuses to go to a SNF, she has been kept for problems related to mobililty. Patient requested a B12 injection and vitamins D and E as taken prior to admission and all of theses were given.  As discussed with Dr. Vickey Sages, the patient is felt surgically stable for discharge (with home health, oxygen, and PT) today.   Latest Vital Signs: Blood pressure (!) 148/88, pulse 72, temperature (!) 97.5 F (36.4 C), temperature source Oral, resp. rate 18, height  (1.676 m), weight 97.2 kg, SpO2 94 %.  Physical Exam: Cardiovascular: IRRR IRRR Pulmonary: Clear to auscultation bilaterally Abdomen: Soft, non tender, bowel sounds present. Extremities: Mild bilateral lower extremity edema Wounds: Clean and dry.  No erythema or signs of infection.   Discharge Condition: Stable and discharged to home.  Recent laboratory studies:  Lab Results  Component Value Date   WBC 6.7 01/10/2019   HGB 9.2 (L) 01/10/2019   HCT 30.4 (L) 01/10/2019   MCV 81.9 01/10/2019   PLT 371 01/10/2019   Lab Results  Component Value Date   NA 135 01/12/2019   K 3.9 01/12/2019   CL 90 (L) 01/12/2019   CO2 33 (H) 01/12/2019   CREATININE 1.10 (H) 01/12/2019   GLUCOSE 98 01/12/2019      Diagnostic Studies: Dg Chest 1 View  Result Date: 12/24/2018 CLINICAL DATA:  85 year old female with a history of shortness of breath, recent repair of acute aortic syndrome EXAM: CHEST  1 VIEW COMPARISON:  12/23/2018 FINDINGS: Cardiomediastinal silhouette unchanged with surgical changes of median sternotomy. Low lung volumes with hazy opacities bilaterally. Blunting of the right costophrenic angle and left costophrenic angle. No pneumothorax. Unchanged right IJ sheath. Minimally  displaced right rib fracture. Penetration of the lower chest is limited for evaluation of mediastinal drains/epicardial pacing leads. IMPRESSION: Low lung volumes with hazy opacities bilateral lower lungs, potentially atelectasis, trace pleural fluid, and/or developing edema. Surgical changes of median sternotomy, and surgical staples projecting over the right chest wall. Unchanged right IJ sheath. Electronically Signed   By: Gilmer Mor D.O.   On: 12/24/2018 08:46   Dg Chest 2 View  Result Date: 01/12/2019 CLINICAL DATA:  Shortness of breath with increased fluid EXAM: CHEST - 2 VIEW COMPARISON:  Three days ago FINDINGS: Cardiomegaly. Prior median sternotomy. Increasing hazy opacity at the right base. Superimposed streaky density also seen previously and attributed atelectasis or scarring. No pneumothorax. Exaggerated thoracic kyphosis. IMPRESSION: Cardiomegaly and pleural effusions with interval mild increase on the right. Kyphosis with atelectasis. Electronically Signed   By: Marnee Spring M.D.   On: 01/12/2019 10:59   Dg Chest 2 View  Result Date: 01/04/2019 CLINICAL DATA:  History of aortic dissection. EXAM: CHEST - 2 VIEW COMPARISON:  Chest radiograph 01/02/2019 FINDINGS: Stable enlarged cardiac and mediastinal contours status post median sternotomy. Surgical staple line overlies the right hemithorax. Persistent left lower lung patchy consolidation and small  effusion. Small right pleural effusion and underlying consolidation. No definite pneumothorax. IMPRESSION: Cardiomegaly. Persistent small effusions and underlying consolidation. Electronically Signed   By: Annia Belt M.D.   On: 01/04/2019 09:55   Dg Chest 2 View  Result Date: 12/25/2018 CLINICAL DATA:  Pleural effusion short of breath. Replacement ascending aorta 12/20/2018 EXAM: CHEST - 2 VIEW COMPARISON:  12/24/2018 FINDINGS: Progression of right pleural effusion progression of right lower lobe consolidation. Small left effusion  unchanged. Negative for pneumothorax. Cardiac enlargement with vascular congestion unchanged. IMPRESSION: Progression of right pleural effusion and right lower lobe consolidation. No pneumothorax. Electronically Signed   By: Marlan Palau M.D.   On: 12/25/2018 13:23   Ct Head Wo Contrast  Result Date: 12/28/2018 CLINICAL DATA:  Ataxia and altered mental status following repair of aortic dissection. Stroke suspected. EXAM: CT HEAD WITHOUT CONTRAST TECHNIQUE: Contiguous axial images were obtained from the base of the skull through the vertex without intravenous contrast. COMPARISON:  None. FINDINGS: Brain: Moderate generalized atrophy and white matter disease is present bilaterally. There is some asymmetry white matter disease on the left. No acute or focal cortical abnormality is present. Basal ganglia are intact. Insular ribbon is normal bilaterally. The ventricles are of normal size. No significant extraaxial fluid collection is present. Vascular: Atherosclerotic calcifications are present within the cavernous internal carotid arteries bilaterally. There is no hyperdense vessel. Skull: Calvarium is intact. No focal lytic or blastic lesions are present. Sinuses/Orbits: The paranasal sinuses and mastoid air cells are clear. The globes and orbits are within normal limits. IMPRESSION: 1. Extensive white matter hypoattenuation bilaterally. While this is likely chronic. White matter infarcts may be obscured. 2. No acute or focal cortical infarct. 3. Atherosclerosis. Electronically Signed   By: Marin Roberts M.D.   On: 12/28/2018 10:17   Ct Chest Wo Contrast  Result Date: 12/26/2018 CLINICAL DATA:  Followup for thoracic aortic aneurysm. Patient underwent ascending aorta surgery on 12/20/2018. EXAM: CT CHEST WITHOUT CONTRAST TECHNIQUE: Multidetector CT imaging of the chest was performed following the standard protocol without IV contrast. COMPARISON:  12/20/2018 FINDINGS: Cardiovascular: Ascending aorta  graft extends from just above the aortic valve to just proximal to the origin of the innominate artery. Graft measures 3.2 cm anterior-posterior. Just distal to the graft, proximal arch is dilated to approximately 4 cm. The arch decreases caliber becoming normal in caliber throughout the descending thoracic aorta. Atherosclerotic calcifications are noted across the arch and descending thoracic aorta, stable. Fluid attenuation surrounds the ascending aorta extending to the posterior margin of the sternum. This fluid has Hounsfield units averaging around 10. The pericardial effusion noted on the prior exam is mostly resolved with only a minimal amount of pericardial fluid evident. Heart is mildly enlarged. There are three-vessel coronary artery calcifications. Enlarged main pulmonary artery measuring 4.1 cm, stable. Mediastinum/Nodes: Right internal jugular central venous line. Tip projects in the upper superior vena cava. No mediastinal or hilar masses. No enlarged lymph nodes. Trachea is patent. Esophagus is grossly unremarkable. Lungs/Pleura: Small right and minimal left pleural effusions. Dependent lower lobe opacities are noted consistent with atelectasis. Additional opacity is noted right upper lobe adjacent to the minor fissure also consistent with atelectasis. Bilateral interstitial thickening. No pneumothorax. Upper Abdomen: No acute abnormality. Musculoskeletal: Changes from the recent median sternotomy. No bone lesions. IMPRESSION: 1. Status post repair of an ascending thoracic aortic aneurysm. Graft extends above the aortic valve to just proximal to the origin the innominate artery. Graft measures 3.2 cm anterior-posterior. 2. There is  fluid surrounding ascending aorta and throughout much of the anterior mediastinum. The previously seen pericardial effusion is mostly resolved. 3. Small right and minimal left pleural effusions. Also interstitial thickening in the lungs. Mild interstitial pulmonary edema  suspected. 4. Lungs also demonstrate dependent atelectasis mostly in the lower lobes. 5. No pneumothorax. Aortic Atherosclerosis (ICD10-I70.0). Electronically Signed   By: Lajean Manes M.D.   On: 12/26/2018 17:36   Dg Chest Port 1 View  Result Date: 01/09/2019 CLINICAL DATA:  Shortness of breath with activity for 20 days. Status post repair of aortic dissection. Bilateral lower extremity swelling. EXAM: PORTABLE CHEST 1 VIEW COMPARISON:  01/04/2019 FINDINGS: Previous median sternotomy. Unchanged cardiac enlargement. Small right pleural effusion and small to moderate left pleural effusion are identified and appears similar to previous exam. Pulmonary vascular congestion. Bilateral areas of mid lung subsegmental atelectasis is unchanged. IMPRESSION: 1. Cardiac enlargement, bilateral pleural effusions and pulmonary vascular congestion. Unchanged. Electronically Signed   By: Kerby Moors M.D.   On: 01/09/2019 12:54   Dg Chest Port 1 View  Result Date: 01/02/2019 CLINICAL DATA:  S/p sternotomy. EXAM: PORTABLE CHEST 1 VIEW COMPARISON:  12/29/2018 FINDINGS: Post median sternotomy and right thoracotomy with skin staples and sternotomy wires in place as before. Cardiomediastinal contours remain markedly enlarged with signs of effusion on the right and bibasilar consolidation unchanged from previous exam. Lung volumes remain low IMPRESSION: No interval change in the appearance of the chest with persistent right pleural effusion and bibasilar consolidation. Electronically Signed   By: Zetta Bills M.D.   On: 01/02/2019 08:31   Dg Chest Port 1 View  Result Date: 12/29/2018 CLINICAL DATA:  Shortness of breath was respiratory failure EXAM: PORTABLE CHEST 1 VIEW COMPARISON:  December 28, 2018 FINDINGS: The lung volumes are low. The heart size remains enlarged. There are small to moderate-sized bilateral pleural effusions with adjacent airspace opacities favored to represent atelectasis. There is no pneumothorax.  Vascular congestion is noted. Skin staples are projected over the right upper chest wall. IMPRESSION: Stable appearance of the chest when compared to 1 day prior. Electronically Signed   By: Constance Holster M.D.   On: 12/29/2018 05:57   Dg Chest Port 1 View  Result Date: 12/28/2018 CLINICAL DATA:  Pleural effusion EXAM: PORTABLE CHEST 1 VIEW COMPARISON:  Chest x-rays dated 12/26/2018 and 12/25/2018. Chest CT dated 12/26/2018. FINDINGS: Stable cardiomegaly. Stable bibasilar opacities, compatible with the combination of atelectasis and small pleural effusions demonstrated on chest CT of 12/26/2018. No new lung findings. No pneumothorax is seen. Median sternotomy wires appear intact and stable in alignment. IMPRESSION: Stable chest x-ray. Stable bibasilar opacities, compatible with the combination of atelectasis and small pleural effusions demonstrated on chest CT of 10/30/202020. Electronically Signed   By: Franki Cabot M.D.   On: 12/28/2018 08:50   Dg Chest Port 1 View  Result Date: 12/26/2018 CLINICAL DATA:  Shortness of breath, coronary bypass EXAM: PORTABLE CHEST 1 VIEW COMPARISON:  12/25/2018 FINDINGS: Right IJ vascular sheath tip at the innominate venous confluence level. The vascular sheath is kinked at the neck. Median sternotomy changes noted. Heart is enlarged with similar bibasilar atelectasis/consolidation and bilateral pleural effusions, worse on the right. No pneumothorax. Aorta atherosclerotic. No significant interval change. IMPRESSION: Stable cardiomegaly, bibasilar atelectasis/consolidation and pleural effusions. Little interval change. No pneumothorax Electronically Signed   By: Jerilynn Mages.  Shick M.D.   On: 12/26/2018 09:59   Dg Chest Port 1 View  Result Date: 12/23/2018 CLINICAL DATA:  Thoracic aortic aneurysm. EXAM:  PORTABLE CHEST 1 VIEW COMPARISON:  December 22, 2018. FINDINGS: Stable cardiomegaly. Right internal jugular venous sheath is noted. Right-sided chest tube is noted without  pneumothorax. No pneumothorax or pleural effusion is noted. Minimal bibasilar subsegmental atelectasis is noted. Bony thorax is unremarkable. IMPRESSION: Stable right-sided chest tube without pneumothorax. Minimal bibasilar subsegmental atelectasis. Electronically Signed   By: Lupita RaiderJames  Green Jr M.D.   On: 12/23/2018 07:33   Dg Chest Port 1 View  Result Date: 12/22/2018 CLINICAL DATA:  Aortic aneurysm repair with sore chest EXAM: PORTABLE CHEST 1 VIEW COMPARISON:  Yesterday FINDINGS: Extubation with essentially stable lung volumes. Similar degree of atelectasis at the bases. No visible pleural effusion or pneumothorax. Stable heart size when accounting for differences in rotation. Chest tubes and right IJ sheath remain. IMPRESSION: Extubation with stable lung volumes and atelectasis. Electronically Signed   By: Marnee SpringJonathon  Watts M.D.   On: 12/22/2018 07:18   Dg Chest Port 1 View  Result Date: 12/21/2018 CLINICAL DATA:  Postop aortic aneurysm repair EXAM: PORTABLE CHEST 1 VIEW COMPARISON:  12/20/2018 FINDINGS: Endotracheal tube in good position. Swan-Ganz catheter in the main pulmonary artery. Bilateral chest tubes in place. No pneumothorax Left lower lobe atelectasis. Right perihilar atelectasis. Possible small left effusion. NG tube in the stomach. IMPRESSION: Support lines in good position. No pneumothorax postop median sternotomy. Bibasilar atelectasis left greater than right. Electronically Signed   By: Marlan Palauharles  Clark M.D.   On: 12/21/2018 07:54   Dg Chest Portable 1 View  Result Date: 12/20/2018 CLINICAL DATA:  Chest pain EXAM: PORTABLE CHEST 1 VIEW COMPARISON:  None. FINDINGS: There is mild cardiomegaly. Mildly increased interstitial markings seen within the left mid lung and left lower lung. Streaky atelectasis seen at the right lung base. No pleural effusion. No acute osseous abnormality. IMPRESSION: Nonspecific increased interstitial markings at the left lower lung. This could be due to  atelectasis, chronic lung changes, and/or early infectious etiology. Electronically Signed   By: Jonna ClarkBindu  Avutu M.D.   On: 12/20/2018 19:17   Ct Angio Chest/abd/pel For Dissection W And/or Wo Contrast  Result Date: 12/20/2018 CLINICAL DATA:  85 year old female with ongoing chest pain radiating to her shoulder blade. New onset of shortness of breath. EXAM: CT ANGIOGRAPHY CHEST, ABDOMEN AND PELVIS TECHNIQUE: Multidetector CT imaging through the chest, abdomen and pelvis was performed using the standard protocol during bolus administration of intravenous contrast. Multiplanar reconstructed images and MIPs were obtained and reviewed to evaluate the vascular anatomy. CONTRAST:  100mL OMNIPAQUE IOHEXOL 350 MG/ML SOLN COMPARISON:  Chest radiograph dated 12/20/2018. FINDINGS: CTA CHEST FINDINGS Cardiovascular: There is no cardiomegaly. There is a large pericardial effusion measuring up to 3.7 cm in thickness anterior to the heart. There is associated slight mass effect and flattening of the anterior wall of the right ventricle. Higher attenuation of the pericardial fluid most consistent with hemopericardium. They are is a crescentic high attenuating density starting from the aortic root and extending to the proximal aortic arch most consistent with intramural hematoma. This appears to extend into the brachiocephalic trunk. The visualized origins of the great vessels of the aortic arch otherwise remain patent. There is a penetrating ulcer or a pseudoaneurysm along the medial wall of the ascending aorta measuring approximately 1 cm in depth and 3 cm in length (coronal series 11, image 67 and axial series 8, image 50). There is a focal area of heterogeneous density and opacification involving the proximal descending thoracic aorta (series 8, image 80 and coronal series 11 images 107-115).  This may represent an area of intimal injury, or developing clot. No definite extraluminal contrast identified. Mediastinum/Nodes: There  is no hilar or mediastinal adenopathy. The esophagus and the thyroid gland are grossly unremarkable. Lungs/Pleura: Trace bilateral pleural effusions. There are bibasilar subsegmental atelectasis as well as diffuse linear and hazy densities throughout the lungs most consistent with atelectatic changes or areas of air trapping. No consolidative changes. No pneumothorax. The central airways are patent. Musculoskeletal: There is degenerative changes of the spine. Multilevel disc desiccation and vacuum phenomena. No acute osseous pathology. Review of the MIP images confirms the above findings. CTA ABDOMEN AND PELVIS FINDINGS VASCULAR Aorta: Mild atherosclerotic calcification. No aneurysmal dilatation or dissection. Celiac: Patent without evidence of aneurysm, dissection, vasculitis or significant stenosis. SMA: Patent without evidence of aneurysm, dissection, vasculitis or significant stenosis. Renals: Both renal arteries are patent without evidence of aneurysm, dissection, vasculitis, fibromuscular dysplasia or significant stenosis. IMA: Patent without evidence of aneurysm, dissection, vasculitis or significant stenosis. Inflow: Patent without evidence of aneurysm, dissection, vasculitis or significant stenosis. Veins: No obvious venous abnormality within the limitations of this arterial phase study. Review of the MIP images confirms the above findings. NON-VASCULAR No intra-abdominal free air or free fluid. Hepatobiliary: The liver and gallbladder appear unremarkable. Pancreas: Unremarkable. No pancreatic ductal dilatation or surrounding inflammatory changes. Spleen: Normal in size without focal abnormality. Adrenals/Urinary Tract: The adrenal glands are unremarkable. Minimal fullness of the left renal collecting systems versus parapelvic cyst. The right kidney is unremarkable. The visualized ureters appear unremarkable. The urinary bladder is collapsed. Stomach/Bowel: There is no bowel obstruction or active  inflammation. The appendix is not visualized with certainty. No inflammatory changes identified in the right lower quadrant. Lymphatic: No adenopathy. Reproductive: Hysterectomy. No pelvic mass. Other: None Musculoskeletal: Osteopenia with multilevel degenerative changes of the spine. Multilevel disc desiccation and vacuum phenomena. No acute osseous pathology. Review of the MIP images confirms the above findings. IMPRESSION: 1. Findings most consistent with active intramural hemorrhage involving the aortic root, ascending aorta and proximal aortic arch with a large hemopericardium. Clinical correlation is recommended to evaluate for cardiac tamponade. 2. Focal penetrating ulcer or pseudoaneurysm of the medial wall of the distal ascending aorta. 3. Focal heterogeneity of intraluminal contrast in the proximal descending thoracic aorta may represent underlying intimal injury. 4. No acute intra-abdominal or pelvic pathology. These results were called by telephone at the time of interpretation on 12/20/2018 at 8:00 pm to provider Excell Seltzer, who verbally acknowledged these results. Electronically Signed   By: Elgie Collard M.D.   On: 12/20/2018 20:36     Discharge Medications: Allergies as of 01/16/2019   No Known Allergies     Medication List    TAKE these medications   amiodarone 200 MG tablet Commonly known as: PACERONE Take 1 tablet (200 mg total) by mouth daily.   apixaban 5 MG Tabs tablet Commonly known as: ELIQUIS Take 1 tablet (5 mg total) by mouth 2 (two) times daily.   aspirin 81 MG chewable tablet Chew 1 tablet (81 mg total) by mouth daily.   ferrous sulfate 325 (65 FE) MG tablet Take 1 tablet (325 mg total) by mouth daily. For one month then stop. If develops constipation, may stop sooner   furosemide 40 MG tablet Commonly known as: LASIX Take 1 tablet (40 mg total) by mouth daily.   levothyroxine 150 MCG tablet Commonly known as: SYNTHROID Take 150 mcg by mouth daily before  breakfast.   metoprolol succinate 50 MG 24 hr tablet Commonly  known as: Toprol XL Take 1 tablet (50 mg total) by mouth daily. Take with or immediately following a meal.   multivitamin with minerals Tabs tablet Take 1 tablet by mouth daily.   Potassium Chloride ER 20 MEQ Tbcr Take 20 mEq by mouth daily.            Durable Medical Equipment  (From admission, onward)         Start     Ordered   01/08/19 0748  For home use only DME oxygen  Once    Comments: Patient needs oxygen with ambulation  Question Answer Comment  Length of Need 6 Months   Mode or (Route) Nasal cannula   Liters per Minute 2      01/08/19 0748   01/06/19 1017  For home use only DME 4 wheeled rolling walker with seat  Once    Comments: Post op  Question:  Patient needs a walker to treat with the following condition  Answer:  Weakness generalized   01/06/19 1016   01/06/19 0837  For home use only DME Hospital bed  Once    Question Answer Comment  Length of Need 6 Months   The above medical condition requires: Patient requires the ability to reposition frequently   Bed type Semi-electric      01/06/19 0836   01/06/19 0836  For home use only DME 3 n 1  Once     01/06/19 0836   01/06/19 0836  For home use only DME oxygen  Once    Question Answer Comment  Length of Need 6 Months   Mode or (Route) Nasal cannula   Liters per Minute 2   Frequency Continuous (stationary and portable oxygen unit needed)   Oxygen delivery system Gas      01/06/19 0836         The patient has been discharged on:   1.Beta Blocker:  Yes [  x ]                              No   [   ]                              If No, reason:  2.Ace Inhibitor/ARB: Yes [   ]                                     No  [ x   ]                                     If No, reason:Somewhat labile BP  3.Statin:   Yes [   ]                  No  [   ]                  If No, reason:No CAD  4.Marlowe Kays:  Yes  [ x  ]                  No   [    ]                  If  No, reason:  Follow Up Appointments: Follow-up Information    Linden Dolin, MD. Go on 01/30/2019.   Specialty: Cardiothoracic Surgery Why: PA/LAT CXR to be taken (at [facility] which is in the same building as Dr. Vickey Sages' office) on 12/04 at 2:00 pm;Appointment time is at 2:30 pm Contact information: [address] [place] [address] [phone]        [facility] Follow up.   Why: 3n1 and rollator have been delivered to room       [facility] Follow up.   Specialty: Home Health Services Why: HHRN/PT- arranged- they will contact you to set up home visits Contact information: [address] [place] [address] [phone]        Thomasene Ripple, DO. Go on 01/27/2019.   Specialty: Cardiology Why: Appointment is at 2:15 pm Contact information: [address] [place] [address] [phone]           Signed: Lelon Huh [facility] 01/16/2019, 8:44 AM

## 2019-01-12 NOTE — Care Management Important Message (Signed)
Important Message  Patient Details  Name: Kaitlyn Rodriguez MRN: [id number] Date of Birth: 07-23-1933   Medicare Important Message Given:  Yes     Shelda Altes 01/12/2019, 2:20 PM

## 2019-01-12 NOTE — Progress Notes (Signed)
CARDIAC REHAB PHASE I   PRE:  Rate/Rhythm: 81 afib    BP: sitting 123/88    SaO2: 93 2L, 89 RA EOB  MODE:  Ambulation: 80 ft (40 ft x2)   POST:  Rate/Rhythm: 136 afib    BP: sitting 138/68     SaO2: 86 2L, 95 2L with rest  Pt in bed, did not walk since Saturday. Attempted RA however moving to EOB SaO2 89 RA therefore used 2L for ambulation in hall. Pt with many c/o. More tired and anxious. Immediately with standing she had labored breathing, poor posture, and grunting every breath. We constantly encouraged pt to slow breathing, encouraged pursed lip breathing and more upright posture. Pt tolerated poorly, quite anxious. SAO2 lower today although hard to register. Sat x1 in hall, needing many verbal cues for safety. To recliner after walk. Left on 2L, SaO2 seemed to be 86 2L after walk. Encouraged IS (500 mL consistently) and flutter valve. Pt needs to practice stairs again (noted "release of sternal precautions"). [id number]   Mc[place]enzie, ACSM 01/12/2019 10:21 AM

## 2019-01-12 NOTE — Progress Notes (Signed)
Occupational Therapy Treatment Patient Details Name: Kaitlyn Rodriguez MRN: [id number] DOB: March 22, 1933 Today's Date: 01/12/2019    History of present illness Patient is an 85 y/o female admitted with 1 week of subtle symptoms of malaise and fatigue, shoulder pain which worsened on 12/20/2018.   Work-up suggested myocardial infarction and assessment showed large pericardial effusion.  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 Pt with worsening respiratory status on 10/29 requiring BiPap, transitioning to critical care on 10/30.   OT comments  Pt with limited endurance and still needing min assist for sit to stand and functional transfers with use of her UE as latest MD note permits.  She continues to need Oxygen as well but during this OT session her sats remained greater than 90% on 2Ls with transfer over to the sink and back to the bedside chair.  Feel she will benefit from tub bench at discharge along with the 3:1 as well and both pt and son are in agreement and wish to purchase.  Recommend continued acute care OT with follow-up HHOT.  Pt's son will be able to provide safe assist at pt's current level.    Follow Up Recommendations  Home health OT;Supervision/Assistance - 24 hour    Equipment Recommendations  Tub/shower bench;3 in 1 bedside commode       Precautions / Restrictions Precautions Precautions: Fall Precaution Comments: Per MD progress note pt can push up from surfaces and use the rails on the steps now Restrictions Weight Bearing Restrictions: Yes       Mobility Bed Mobility                  Transfers Overall transfer level: Needs assistance Equipment used: 4-wheeled walker Transfers: Sit to/from Stand Sit to Stand: Min assist Stand pivot transfers: Min assist       General transfer comment: Mod instructional cueing to recall the need to lock the rollator brakes when  sitting.    Balance Overall balance assessment: Needs assistance Sitting-balance support: Feet supported Sitting balance-Leahy Scale: Fair     Standing balance support: Bilateral upper extremity supported Standing balance-Leahy Scale: Poor Standing balance comment: reliant on BUE and external support of the rollator or the sink                           ADL either performed or assessed with clinical judgement   ADL Overall ADL's : Needs assistance/impaired     Grooming: Wash/dry hands;Standing;Minimal assistance                   Toilet Transfer: Minimal assistance Armed forces technical officer Details (indicate cue type and reason): simulated with rollator         Functional mobility during ADLs: Minimal assistance(rollator) General ADL Comments: Pt needs min assist for functional mobility with use of the rollator.  She needs mod instructional cueing for locking the rollator brakes when stopping at the sink to wash her hands.  Flexed posture noted in standing with dyspnea 2/4 during activity.  Discussed need for tub bench for home and both pt and her son are in agreement and want to purchase.               Cognition Arousal/Alertness: Awake/alert Behavior During Therapy: WFL for tasks assessed/performed   Area of Impairment: Attention  Current Attention Level: Sustained;Selective   Following Commands: Follows one step commands consistently   Awareness: Intellectual;Emergent                       Pertinent Vitals/ Pain       Faces Pain Scale: Hurts a little bit Pain Location: incision Pain Intervention(s): Limited activity within patient's tolerance;Monitored during session         Frequency  Min 2X/week        Progress Toward Goals  OT Goals(current goals can now be found in the care plan section)     Acute Rehab OT Goals OT Goal Formulation: With patient Time For Goal Achievement: 01/26/19 Potential to  Achieve Goals: Good  Plan Discharge plan needs to be updated;Frequency remains appropriate       AM-PAC OT "6 Clicks" Daily Activity     Outcome Measure   Help from another person eating meals?: None Help from another person taking care of personal grooming?: A Little Help from another person toileting, which includes using toliet, bedpan, or urinal?: A Lot Help from another person bathing (including washing, rinsing, drying)?: A Little Help from another person to put on and taking off regular upper body clothing?: A Little Help from another person to put on and taking off regular lower body clothing?: A Lot 6 Click Score: 17    End of Session Equipment Utilized During Treatment: Other (comment)(rollator and O2)  OT Visit Diagnosis: Unsteadiness on feet (R26.81);Muscle weakness (generalized) (M62.81)   Activity Tolerance Patient limited by fatigue   Patient Left in chair;with call bell/phone within reach;with family/visitor present   Nurse Communication Mobility status        Time: 7353-2992 OT Time Calculation (min): 24 min  Charges: OT General Charges $OT Visit: 1 Visit OT Treatments $Self Care/Home Management : 23-37 mins    Breyson Kelm OTR/L 01/12/2019, 5:21 PM

## 2019-01-13 ENCOUNTER — Other Ambulatory Visit: Payer: Self-pay

## 2019-01-13 MED ORDER — ALPRAZOLAM 0.5 MG PO TABS
0.5000 mg | ORAL_TABLET | Freq: Every evening | ORAL | Status: DC | PRN
  Administered 2019-01-13: 0.5 mg via ORAL
  Filled 2019-01-13: qty 1

## 2019-01-13 MED ORDER — LACTULOSE 10 GM/15ML PO SOLN
20.0000 g | Freq: Once | ORAL | Status: AC
  Administered 2019-01-13: 20 g via ORAL
  Filled 2019-01-13: qty 30

## 2019-01-13 NOTE — Progress Notes (Signed)
SATURATION QUALIFICATIONS: (This note is used to comply with regulatory documentation for home oxygen)  Patient Saturations on Room Air at Rest = 97%  Patient Saturations on Room Air while Ambulating = 84%  Patient Saturations on 2 Liters of oxygen while Ambulating = 92%  Please briefly explain why patient needs home oxygen: Yes, patient needs oxygen. Pt desats while ambulating on room air

## 2019-01-13 NOTE — Plan of Care (Signed)
  Problem: Skin Integrity: Goal: Wound healing without signs and symptoms of infection Outcome: Progressing   Problem: Health Behavior/Discharge Planning: Goal: Ability to manage health-related needs will improve Outcome: Not Progressing

## 2019-01-13 NOTE — Progress Notes (Signed)
      [address]       [place] [address]             [phone]        24 Days Post-Op Procedure(s) (LRB): REPAIR OF TYPE A AORTIC DISSECTION USING HEMASHIELD PLATINUM 28MM AND 32MM GRAFT (N/A) STERNAL PLATING  Subjective: Patient states she has slept well last 2 nights. She is trying to use her incentive spirometer.  Objective: Vital signs in last 24 hours: Temp:  [97.5 F (36.4 C)-98.5 F (36.9 C)] 97.5 F (36.4 C) (11/17 0400) Pulse Rate:  [77-85] 81 (11/17 0400) Cardiac Rhythm: Atrial fibrillation (11/16 2200) Resp:  [16-22] 16 (11/17 0400) BP: (102-113)/(70-77) 104/74 (11/17 0400) SpO2:  [95 %-99 %] 97 % (11/17 0400) Weight:  [96.1 kg] 96.1 kg (11/17 0400)  Pre op weight 86.2 kg Current Weight  01/13/19 96.1 kg      Intake/Output from previous day: No intake/output data recorded.   Physical Exam:  Cardiovascular: IRRR IRRR Pulmonary: Slightly diminished bibasilar breath sounds Abdomen: Soft, non tender, bowel sounds present. Extremities:Trace bilateral lower extremity edema Wounds: Clean and dry.  No erythema or signs of infection.  Lab Results: CBC: No results for input(s): WBC, HGB, HCT, PLT in the last 72 hours. BMET:  Recent Labs    01/11/19 0655 01/12/19 0653  NA 133* 135  K 4.4 3.9  CL 92* 90*  CO2 31 33*  GLUCOSE 96 98  BUN 16 17  CREATININE 0.93 1.10*  CALCIUM 8.3* 8.5*    PT/INR:  Lab Results  Component Value Date   INR 2.1 (H) 12/21/2018   INR 1.3 (H) 12/20/2018   ABG:  INR: Will add last result for INR, ABG once components are confirmed Will add last 4 CBG results once components are confirmed  Assessment/Plan:  1. CV - A fib with CVR. On Amiodarone 200 mg bid, Lopressor 25 mg bid, and Apixaban 5 mg bid.  2.  Pulmonary - On room air this am but does desat with ambulation so will need oxygen at discharge . On Xopenex 0.63 bid. Encourage incentive spirometer 3. Volume Overload - On Lasix 40 mg bid.  4.   Acute blood loss anemia - Last H and H 9.2 and 30.4 5. Deconditioned-must continue to ambulate, PT following 6. Hypothyroidism-on Levothyroxine 150 mcg daily. 7. Severely deconditioned and obese-continue to encourage ambulation 8. Insomnia-Ambien PRN  9. LOC constipation  Donielle M ZimmermanPA-C 01/13/2019,7:30 AM

## 2019-01-13 NOTE — Progress Notes (Signed)
CARDIAC REHAB PHASE I   PRE:  Rate/Rhythm: 83 Afib  BP:  Sitting: 91/88 --> 135/76     SaO2: 97 RA  MODE:  Ambulation: 70 ft x2   POST:  Rate/Rhythm: 133 Afib  BP:  Sitting: 151/85    SaO2: 84 RA --> 92 2L  After some convincing, pt agreeable to ambulate in hall. With strong encouragement, pt ambulated 46ft in hallway assist of one with gait belt and rollator. Pt took one long sitting rest break c/o SOB. Pt coached through purse lipped breathing, and placed on 2L [place]. Pt then ambulated back to room. Pt in recliner, bedside table and call bell within reach. Stressed importance of another walk later today and continued IS use. Pt obtaining 500 on IS, with an ocassional 625.  [phone] Rufina Falco, RN BSN 01/13/2019 10:37 AM

## 2019-01-13 NOTE — Progress Notes (Signed)
Nutrition Follow up  DOCUMENTATION CODES:   Obesity unspecified  INTERVENTION:    Continue Ensure Enlive po BID, each supplement provides 350 kcal and 20 grams of protein  Continue MVI daily  NUTRITION DIAGNOSIS:   Increased nutrient needs related to post-op healing as evidenced by estimated needs.  Ongoing  GOAL:   Patient will meet greater than or equal to 90% of their needs   Met PO  MONITOR:   Supplement acceptance, Weight trends, Labs, I & O's, PO intake, Skin  REASON FOR ASSESSMENT:   LOS    ASSESSMENT:   Patient with PMH significant for a.fib and hypothyroidism. Presents this admission with Type A aortic dissection.   10/25- s/p repair type A dissection   Pt reports appetite is great. Meal completions charted as 80-100% for her last eight meals. Drinking Ensure BID. Complains of constipation. Bowel regimen started this am.   Admission weight: 102.2 kg  Current weight: 96.1 kg  I/O: -8,038 ml since 11/3 UOP: no documentation?   Medications: dulcolax, colace, 40 mg lasix daily, MVI with minerals, 20 mEq KCl daily  Labs:   Diet Order:   Diet Order            Diet heart healthy/carb modified Room service appropriate? Yes; Fluid consistency: Thin  Diet effective now              EDUCATION NEEDS:   Education needs have been addressed  Skin:  Skin Assessment: Skin Integrity Issues: Skin Integrity Issues:: Incisions Incisions: chest  Last BM:  11/12  Height:   Ht Readings from Last 1 Encounters:  01/03/19 '5\' 6"'  (1.676 m)    Weight:   Wt Readings from Last 1 Encounters:  01/13/19 96.1 kg    Ideal Body Weight:  56.8 kg  BMI:  Body mass index is 34.2 kg/m.  Estimated Nutritional Needs:   Kcal:  1700-1900 kcal  Protein:  85-100 grams  Fluid:  >/= 1.7 L/day   Mariana Single RD, LDN Clinical Nutrition Pager # - [phone]

## 2019-01-13 NOTE — Progress Notes (Signed)
Occupational Therapy Treatment Patient Details Name: Kaitlyn Rodriguez MRN: [id number] DOB: [birth year] Today's Date: 01/13/2019    History of present illness 85 y/o female admitted with 1 week of subtle symptoms of malaise and fatigue, shoulder pain which worsened on 12/20/2018.   Work-up suggested myocardial infarction and assessment showed large pericardial effusion.  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 Pt with worsening respiratory status on 10/29 requiring BiPap, transitioning to critical care on 10/30.   OT comments  Pt seated in recliner chair with son present in room. Pt reporting extreme fatigue this session secondary to multiple BMs. Pt declines ambulating/transfers with this therapist even after education and reminder she should ambulate 3x/ daily. OT providing pt and caregiver with education regarding energy conservation with mobility and self care. Pt and caregiver asking appropriate questions. OT also educating them on expectations for HHOT intervention as well. Reviewed recommendations for equipment and that [facility] therapist could set up TTB at home and assist pt with first shower for safety if needed. Pt remained in wheelchair with all needs within reach.   Follow Up Recommendations  Home health OT;Supervision/Assistance - 24 hour    Equipment Recommendations  Tub/shower bench;3 in 1 bedside commode       Precautions / Restrictions Precautions Precautions: Fall Precaution Comments: Per MD progress note pt can push up from surfaces and use the rails on the steps now       Mobility Bed Mobility       General bed mobility comments: up in recliner chair with OT arrived     Balance Overall balance assessment: Needs assistance Sitting-balance support: Feet supported Sitting balance-Leahy Scale: Fair Sitting balance - Comments: fair            ADL either performed or assessed  with clinical judgement                  Cognition Arousal/Alertness: Awake/alert Behavior During Therapy: WFL for tasks assessed/performed Overall Cognitive Status: Impaired/Different from baseline Area of Impairment: Safety/judgement;Awareness          Current Attention Level: Sustained;Selective Memory: Decreased short-term memory Following Commands: Follows one step commands inconsistently;Follows one step commands with increased time Safety/Judgement: Decreased awareness of safety;Decreased awareness of deficits Awareness: Intellectual Problem Solving: Slow processing;Decreased initiation;Requires verbal cues                     Pertinent Vitals/ Pain       Pain Assessment: 0-10 Pain Score: 4  Pain Location: incision Pain Descriptors / Indicators: Guarding;Sore Pain Intervention(s): Limited activity within patient's tolerance;Monitored during session         Frequency  Min 2X/week        Progress Toward Goals  OT Goals(current goals can now be found in the care plan section)  Progress towards OT goals: Progressing toward goals  Acute Rehab OT Goals Patient Stated Goal: to return home OT Goal Formulation: With patient Time For Goal Achievement: 01/26/19 Potential to Achieve Goals: Good  Plan Discharge plan needs to be updated;Frequency remains appropriate       AM-PAC OT "6 Clicks" Daily Activity     Outcome Measure   Help from another person eating meals?: None Help from another person taking care of personal grooming?: A Little Help from another person toileting, which includes using toliet, bedpan, or urinal?: A Lot Help from another person bathing (including washing, rinsing, drying)?: A Little  Help from another person to put on and taking off regular upper body clothing?: A Little Help from another person to put on and taking off regular lower body clothing?: A Lot 6 Click Score: 17    End of Session    OT Visit Diagnosis:  Unsteadiness on feet (R26.81);Muscle weakness (generalized) (M62.81)   Activity Tolerance Patient limited by fatigue   Patient Left in chair;with call bell/phone within reach;with family/visitor present   Nurse Communication Mobility status        Time: 7412-8786 OT Time Calculation (min): 20 min  Charges: OT General Charges $OT Visit: 1 Visit OT Treatments $Self Care/Home Management : 8-22 mins  Gypsy Decant MS, OTR/L 01/13/2019, 3:47 PM

## 2019-01-14 MED ORDER — CYANOCOBALAMIN 1000 MCG/ML IJ SOLN
1000.0000 ug | Freq: Once | INTRAMUSCULAR | Status: AC
  Administered 2019-01-14: 1000 ug via INTRAMUSCULAR
  Filled 2019-01-14: qty 1

## 2019-01-14 MED ORDER — ALPRAZOLAM 0.5 MG PO TABS
0.5000 mg | ORAL_TABLET | Freq: Every evening | ORAL | Status: DC | PRN
  Administered 2019-01-14 – 2019-01-16 (×3): 0.5 mg via ORAL
  Filled 2019-01-14 (×3): qty 1

## 2019-01-14 NOTE — Progress Notes (Signed)
CARDIAC REHAB PHASE I   PRE:  Rate/Rhythm: 79 Afib  BP:  Sitting: 98/59      SaO2: 98 2L  MODE:  Ambulation: 40 ft x3    POST:  Rate/Rhythm: 110 Afib  BP:  Sitting: 122/87    SaO2: 96 2L  Pt helped to BR than ambulated 4ft in hallway with gait belt and rollator. Pt c/o leg weakness. Pt ambulated another 79ft and took another long sitting rest break. With strong support and encouragement pt walked 60ft back to her room. Pt returned to recliner, call bell and bedside table within reach. Encouraged continued IS use. Son at bedside. Will continue to follow and encourage ambulation.  [phone] Rufina Falco, RN BSN 01/14/2019 2:02 PM

## 2019-01-14 NOTE — Progress Notes (Signed)
      [address]       [place] [address]             [phone]        25 Days Post-Op Procedure(s) (LRB): REPAIR OF TYPE A AORTIC DISSECTION USING HEMASHIELD PLATINUM 28MM AND 32MM GRAFT (N/A) STERNAL PLATING  Subjective: Patient upset this am. She states she is 13 days late on her vitamin B12 injection. She did not sleep well because anxious about it.  Objective: Vital signs in last 24 hours: Temp:  [97.6 F (36.4 C)-98.6 F (37 C)] 97.6 F (36.4 C) (11/18 0411) Pulse Rate:  [63-85] 63 (11/18 0411) Cardiac Rhythm: Atrial fibrillation;Bundle branch block (11/17 1900) Resp:  [14-19] 15 (11/18 0500) BP: (102-123)/(61-79) 102/61 (11/18 0411) SpO2:  [95 %-96 %] 96 % (11/18 0500) Weight:  [99.1 kg] 99.1 kg (11/18 0500)  Pre op weight 86.2 kg Current Weight  01/14/19 99.1 kg      Intake/Output from previous day: 11/17 0701 - 11/18 0700 In: -  Out: 350 [Urine:350]   Physical Exam:  Cardiovascular: IRRR IRRR Pulmonary: Slightly diminished bibasilar breath sounds Abdomen: Soft, non tender, bowel sounds present. Extremities:Trace bilateral lower extremity edema Wounds: Clean and dry.  No erythema or signs of infection.  Lab Results: CBC: No results for input(s): WBC, HGB, HCT, PLT in the last 72 hours. BMET:  Recent Labs    01/12/19 0653  NA 135  K 3.9  CL 90*  CO2 33*  GLUCOSE 98  BUN 17  CREATININE 1.10*  CALCIUM 8.5*    PT/INR:  Lab Results  Component Value Date   INR 2.1 (H) 12/21/2018   INR 1.3 (H) 12/20/2018   ABG:  INR: Will add last result for INR, ABG once components are confirmed Will add last 4 CBG results once components are confirmed  Assessment/Plan:  1. CV - A fib with CVR. On Amiodarone 200 mg bid, Lopressor 25 mg bid, and Apixaban 5 mg bid.  2.  Pulmonary - On room air this am but does desat with ambulation so will need oxygen at discharge . On Xopenex 0.63 bid. Encourage incentive spirometer 3. Volume  Overload - On Lasix 40 mg bid.  4.  Acute blood loss anemia - Last H and H 9.2 and 30.4 5. Deconditioned-must continue to ambulate, PT following 6. Hypothyroidism-on Levothyroxine 150 mcg daily. 7. Severely deconditioned and obese-continue to encourage ambulation, PT 8. Insomnia-Ambien PRN  9. Vitamin B12 injection  Leitha Hyppolite M ZimmermanPA-C 01/14/2019,7:28 AM

## 2019-01-14 NOTE — Progress Notes (Signed)
Physical Therapy Treatment Patient Details Name: Kaitlyn Rodriguez MRN: [id number] DOB: [birth year] Today's Date: 01/14/2019    History of Present Illness 85 y/o female admitted with 1 week of subtle symptoms of malaise and fatigue, shoulder pain which worsened on 12/20/2018.   Work-up suggested myocardial infarction and assessment showed large pericardial effusion.  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 Pt with worsening respiratory status on 10/29 requiring BiPap, transitioning to critical care on 10/30.    PT Comments    Pt limited by fatigue during session, reporting a poor nights sleep. Pt continues to require assistance during transfers and ambulation despite MD clearance for use of UE to push/pull. Pt with poor Rollator management, requiring frequent PT verbal and tactile cueing to manage breaks and physical assistance to control descent during transfers. Pt with reduced tolerance for ambulation distance during session, requiring frequent rest breaks. Pt require frequent encouragement from PT to increase activity tolerance. PT continues to recommend SNF at discharge due to continued assistance requirements, poor endurance, and high falls risk. If pt and son decline SNF then pt will benefit from HHPT.  Follow Up Recommendations  SNF;Home health PT(HHPT if pt declines SNF)     Equipment Recommendations  Wheelchair (measurements PT);Rolling walker with 5" wheels    Recommendations for Other Services       Precautions / Restrictions Precautions Precautions: Fall Precaution Comments: Per MD progress note pt can push up from surfaces and use the rails on the steps now Restrictions Weight Bearing Restrictions: No Other Position/Activity Restrictions: sternal precautions     Mobility  Bed Mobility Overal bed mobility: Needs Assistance Bed Mobility: Supine to Sit     Supine to sit:  Supervision;HOB elevated        Transfers Overall transfer level: Needs assistance Equipment used: 4-wheeled walker Transfers: Sit to/from [facility] Sit to Stand: Min guard;Min assist Stand pivot transfers: Min assist;Mod assist       General transfer comment: pt requires min-minA for sit to stand from bed, recliner, and rollator. PT requiring min-modA for stand step turn transfers in order to sit on Rollator during OOB activity. Pt also requiring minA to slow descent during stand to sit.  Ambulation/Gait Ambulation/Gait assistance: Min guard Gait Distance (Feet): 60 Feet(2 additional trials of 40') Assistive device: 4-wheeled walker Gait Pattern/deviations: Step-to pattern;Wide base of support;Trunk flexed Gait velocity: reduced Gait velocity interpretation: <1.8 ft/sec, indicate of risk for recurrent falls General Gait Details: PT providing cues for hand placement, increased trunk extension, energy conservation, and Rollator break management   Stairs Stairs: Yes Stairs assistance: Min assist Stair Management: One rail Left;Sideways Number of Stairs: 1(1 step up and down 3 times)     Wheelchair Mobility    Modified Rankin (Stroke Patients Only)       Balance Overall balance assessment: Needs assistance Sitting-balance support: Single extremity supported;Feet supported Sitting balance-Leahy Scale: Good Sitting balance - Comments: supervision   Standing balance support: Bilateral upper extremity supported Standing balance-Leahy Scale: Fair Standing balance comment: minG with BUE support of Rollator, minA with unilateral UE support of Rollator                            Cognition Arousal/Alertness: Awake/alert Behavior During Therapy: WFL for tasks assessed/performed Overall Cognitive Status: Impaired/Different from baseline Area of Impairment: Safety/judgement;Awareness  Safety/Judgement: Decreased  awareness of safety;Decreased awareness of deficits            Exercises      General Comments General comments (skin integrity, edema, etc.): Pt reporting SOB during session and increased fatigue during activity. Pt SpO2 with unreliable reading during activity, often reporting in 90s however. Pt on 2L [place] throughout session      Pertinent Vitals/Pain Pain Assessment: No/denies pain    Home Living                      Prior Function            PT Goals (current goals can now be found in the care plan section) Acute Rehab PT Goals Patient Stated Goal: to return home Progress towards PT goals: Progressing toward goals    Frequency    Min 2X/week      PT Plan Current plan remains appropriate    Co-evaluation              AM-PAC PT "6 Clicks" Mobility   Outcome Measure  Help needed turning from your back to your side while in a flat bed without using bedrails?: A Little Help needed moving from lying on your back to sitting on the side of a flat bed without using bedrails?: A Little Help needed moving to and from a bed to a chair (including a wheelchair)?: A Lot Help needed standing up from a chair using your arms (e.g., wheelchair or bedside chair)?: A Lot Help needed to walk in hospital room?: A Little Help needed climbing 3-5 steps with a railing? : A Lot 6 Click Score: 15    End of Session Equipment Utilized During Treatment: Gait belt;Oxygen Activity Tolerance: Patient limited by fatigue Patient left: in chair;with call bell/phone within reach Nurse Communication: Mobility status PT Visit Diagnosis: Muscle weakness (generalized) (M62.81)     Time: [id number] PT Time Calculation (min) (ACUTE ONLY): 47 min  Charges:  $Gait Training: 23-37 mins $Therapeutic Activity: 8-22 mins                     Arlyss Gandy, PT, DPT Acute Rehabilitation Pager: [phone]    Arlyss Gandy 01/14/2019, 10:26 AM

## 2019-01-15 ENCOUNTER — Ambulatory Visit: Payer: Medicare Other | Admitting: Cardiology

## 2019-01-15 MED ORDER — FUROSEMIDE 40 MG PO TABS: 40.0000 mg | ORAL_TABLET | Freq: Every day | ORAL | 0 refills | Status: DC

## 2019-01-15 MED ORDER — AMIODARONE HCL 200 MG PO TABS
200.0000 mg | ORAL_TABLET | Freq: Every day | ORAL | Status: DC
  Administered 2019-01-15 – 2019-01-16 (×2): 200 mg via ORAL
  Filled 2019-01-15 (×2): qty 1

## 2019-01-15 MED ORDER — VITAMIN E 180 MG (400 UNIT) PO CAPS
400.0000 [IU] | ORAL_CAPSULE | Freq: Every day | ORAL | Status: DC
  Administered 2019-01-15 – 2019-01-16 (×2): 400 [IU] via ORAL
  Filled 2019-01-15 (×2): qty 1

## 2019-01-15 MED ORDER — AMIODARONE HCL 200 MG PO TABS: 200.0000 mg | ORAL_TABLET | Freq: Every day | ORAL | 1 refills | Status: DC

## 2019-01-15 MED ORDER — VITAMIN D 25 MCG (1000 UNIT) PO TABS
1000.0000 [IU] | ORAL_TABLET | Freq: Every day | ORAL | Status: DC
  Administered 2019-01-15 – 2019-01-16 (×2): 1000 [IU] via ORAL
  Filled 2019-01-15 (×2): qty 1

## 2019-01-15 NOTE — Progress Notes (Addendum)
CARDIAC REHAB PHASE I   PRE:  Rate/Rhythm: 75 Afib  BP:  Sitting: 111/58      SaO2: 92 RA at rest --> 82 RA ---> 93 2L  Pt demonstrates 250 on IS. Pt agreeable to walk, got herself to edge of bed and became SOB. Pts sats 82 on RA. Placed on 2L [place], sats rose to 93. Pt states she is having a harder time breathing today and feels weaker. Pt helped to walk around bed to sit in recliner. Pt looks paler and weaker than she has earlier in the week. Encouraged leg exercises and IS use. Will f/u later as time allows to try and ambulate.  [phone] Kaitlyn Falco, RN BSN 01/15/2019 12:05 PM

## 2019-01-15 NOTE — Progress Notes (Addendum)
      [address]       [place] [address]             [phone]        26 Days Post-Op Procedure(s) (LRB): REPAIR OF TYPE A AORTIC DISSECTION USING HEMASHIELD PLATINUM 28MM AND 32MM GRAFT (N/A) STERNAL PLATING  Subjective: Patient asleep and awakened. She is requesting vitamin D and E this am as she has taken them "for years".  Objective: Vital signs in last 24 hours: Temp:  [97.4 F (36.3 C)-97.9 F (36.6 C)] 97.4 F (36.3 C) (11/19 0539) Pulse Rate:  [70-73] 70 (11/18 1140) Cardiac Rhythm: Atrial fibrillation (11/19 0330) Resp:  [20-25] 22 (11/19 0539) BP: (100-117)/(70-86) 112/86 (11/19 0539) SpO2:  [96 %-98 %] 98 % (11/19 0539) Weight:  [96.5 kg] 96.5 kg (11/19 0539)  Pre op weight 86.2 kg Current Weight  01/15/19 96.5 kg      Intake/Output from previous day: 11/18 0701 - 11/19 0700 In: 225 [P.O.:225] Out: 700 [Urine:700]   Physical Exam:  Cardiovascular: IRRR IRRR Pulmonary: Slightly diminished bibasilar breath sounds Abdomen: Soft, non tender, bowel sounds present. Extremities:Trace bilateral lower extremity edema Wounds: Clean and dry.  No erythema or signs of infection.  Lab Results: CBC: No results for input(s): WBC, HGB, HCT, PLT in the last 72 hours. BMET:  No results for input(s): NA, K, CL, CO2, GLUCOSE, BUN, CREATININE, CALCIUM in the last 72 hours.  PT/INR:  Lab Results  Component Value Date   INR 2.1 (H) 12/21/2018   INR 1.3 (H) 12/20/2018   ABG:  INR: Will add last result for INR, ABG once components are confirmed Will add last 4 CBG results once components are confirmed  Assessment/Plan:  1. CV - A fib with CVR. On Amiodarone 200 mg daily, Lopressor 25 mg bid, and Apixaban 5 mg bid.  2.  Pulmonary - On 2 L oxygen via [place] this am. On Xopenex 0.63 bid. Encourage incentive spirometer 3. Volume Overload - On Lasix 40 mg daily 4.  Acute blood loss anemia - Last H and H 9.2 and 30.4 5. Deconditioned-must continue  to ambulate, PT following 6. Hypothyroidism-on Levothyroxine 150 mcg daily. 7. Insomnia-Ambien PRN  8. Vitamin D and E as patient requested 9. Severely deconditioned and obese-continue to encourage ambulation, PT  Kyesha Balla M ZimmermanPA-C 01/15/2019,7:07 AM

## 2019-01-16 ENCOUNTER — Ambulatory Visit: Payer: Self-pay | Admitting: Cardiothoracic Surgery

## 2019-01-16 MED ORDER — FUROSEMIDE 40 MG PO TABS: 40.0000 mg | ORAL_TABLET | Freq: Every day | ORAL | 0 refills | Status: DC

## 2019-01-16 MED ORDER — FERROUS SULFATE 325 (65 FE) MG PO TABS: 325.0000 mg | ORAL_TABLET | Freq: Every day | ORAL | 0 refills | Status: DC

## 2019-01-16 NOTE — Progress Notes (Signed)
SATURATION QUALIFICATIONS: (This note is used to comply with regulatory documentation for home oxygen)  Patient Saturations on Room Air at Rest = 92%  Patient Saturations on Room Air while Ambulating = 82%  Patient Saturations on 2 Liters of oxygen while Ambulating = 93%  Please briefly explain why patient needs home oxygen: Yes, pt needs oxygen, she desats while ambulating on room air

## 2019-01-16 NOTE — Progress Notes (Signed)
Occupational Therapy Treatment Patient Details Name: Kaitlyn Rodriguez MRN: [id number] DOB: 1933-03-02 Today's Date: 01/16/2019    History of present illness 85 y/o female admitted with 1 week of subtle symptoms of malaise and fatigue, shoulder pain which worsened on 12/20/2018.   Work-up suggested myocardial infarction and assessment showed large pericardial effusion.  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 Pt with worsening respiratory status on 10/29 requiring BiPap, transitioning to critical care on 10/30.   OT comments  This 85 yo female admitted and underwent above presents to acute OT with decreased endurance for activity but tried all I asked of her this AM with increased time. She reports she does not get into tub at home due to fear of falling--so just sponge baths and reports her son will be with her at all times. I did re-educate her on purse lipped breathing and how to use inspirometer. SHe will continue to benefit from OT services to work towards a S level or better.   Follow Up Recommendations  Home health OT;Supervision/Assistance - 24 hour    Equipment Recommendations  Tub/shower bench;3 in 1 bedside commode       Precautions / Restrictions Precautions Precautions: Fall Precaution Comments: Per MD progress note pt can push up from surfaces and use the rails on the steps now Restrictions Weight Bearing Restrictions: No Other Position/Activity Restrictions: sternal precautions        Mobility Bed Mobility Overal bed mobility: Needs Assistance Bed Mobility: Supine to Sit     Supine to sit: Supervision;HOB elevated        Transfers Overall transfer level: Needs assistance Equipment used: Rolling walker (2 wheeled) Transfers: Sit to/from Stand Sit to Stand: Min guard         General transfer comment: Ambulating with rollator min A in room    Balance Overall  balance assessment: Needs assistance Sitting-balance support: No upper extremity supported;Feet supported Sitting balance-Leahy Scale: Good     Standing balance support: Bilateral upper extremity supported;During functional activity Standing balance-Leahy Scale: Poor Standing balance comment: Pt needed support of rollator when standing                           ADL either performed or assessed with clinical judgement   ADL Overall ADL's : Needs assistance/impaired                     Lower Body Dressing: Minimal assistance Lower Body Dressing Details (indicate cue type and reason): don socks, increased time due to gets SOB Toilet Transfer: Minimal assistance;Ambulation Toilet Transfer Details (indicate cue type and reason): rollator, up from bed>around to other side>back around to recliner           General ADL Comments: Pt reports she does not do as well in the AM as she does in the PM and that her SOB with minimal activity this AM is normal for her     Vision Patient Visual Report: No change from baseline            Cognition Arousal/Alertness: Awake/alert Behavior During Therapy: WFL for tasks assessed/performed Overall Cognitive Status: No family/caregiver present to determine baseline cognitive functioning Area of Impairment: Problem solving                         Safety/Judgement: Decreased awareness of safety  Problem Solving: Slow processing;Requires verbal cues General Comments: When going to sit pt just plopped down in recliner, reports she does this at home. Made her aware this is not safe and she verbalized understanding                   Pertinent Vitals/ Pain       Pain Assessment: No/denies pain         Frequency  Min 2X/week        Progress Toward Goals  OT Goals(current goals can now be found in the care plan section)  Progress towards OT goals: Progressing toward goals     Plan Discharge plan  remains appropriate       AM-PAC OT "6 Clicks" Daily Activity     Outcome Measure   Help from another person eating meals?: None Help from another person taking care of personal grooming?: A Little Help from another person toileting, which includes using toliet, bedpan, or urinal?: A Little Help from another person bathing (including washing, rinsing, drying)?: A Little Help from another person to put on and taking off regular upper body clothing?: A Little Help from another person to put on and taking off regular lower body clothing?: A Little 6 Click Score: 19    End of Session Equipment Utilized During Treatment: Other (comment)(rollator)  OT Visit Diagnosis: Unsteadiness on feet (R26.81);Muscle weakness (generalized) (M62.81)   Activity Tolerance Patient limited by fatigue(with minimal activity, sats remained in mid to upper 90's on 2 liters)   Patient Left in chair;with call bell/phone within reach;with chair alarm set   Nurse Communication          Time: [phone] OT Time Calculation (min): 30 min  Charges: OT General Charges $OT Visit: 1 Visit OT Treatments $Self Care/Home Management : 23-37 mins  Ignacia Palma, OTR/L Acute [employer] Pager [phone] Office [phone]      Evette Georges 01/16/2019, 2:02 PM

## 2019-01-16 NOTE — Progress Notes (Signed)
Received call from son that home 02 should be there at 17 per [employer]- will go ahead and schedule [name]- call made to [name] to schedule transport home- paperwork on shadow chart. Bedside RN notified.

## 2019-01-16 NOTE — Progress Notes (Signed)
      [address]       [place] [address]             [phone]        27 Days Post-Op Procedure(s) (LRB): REPAIR OF TYPE A AORTIC DISSECTION USING HEMASHIELD PLATINUM 28MM AND 32MM GRAFT (N/A) STERNAL PLATING  Subjective: Patient states she slept well. She is looking forward to going home.  Objective: Vital signs in last 24 hours: Temp:  [94.8 F (34.9 C)-97.9 F (36.6 C)] 94.8 F (34.9 C) (11/20 0708) Pulse Rate:  [64-72] 64 (11/20 0644) Cardiac Rhythm: Atrial fibrillation;Bundle branch block (11/19 1900) Resp:  [17-27] 23 (11/20 0644) BP: (121-127)/(68-81) 121/68 (11/20 0644) SpO2:  [94 %-98 %] 94 % (11/20 0644) Weight:  [97.2 kg] 97.2 kg (11/20 0643)  Pre op weight 86.2 kg Current Weight  01/16/19 97.2 kg      Intake/Output from previous day: 11/19 0701 - 11/20 0700 In: 3 [I.V.:3] Out: -    Physical Exam:  Cardiovascular: IRRR IRRR Pulmonary: Slightly diminished bibasilar breath sounds Abdomen: Soft, non tender, bowel sounds present. Extremities:Trace bilateral lower extremity edema Wounds: Clean and dry.  No erythema or signs of infection.  Lab Results: CBC: No results for input(s): WBC, HGB, HCT, PLT in the last 72 hours. BMET:  No results for input(s): NA, K, CL, CO2, GLUCOSE, BUN, CREATININE, CALCIUM in the last 72 hours.  PT/INR:  Lab Results  Component Value Date   INR 2.1 (H) 12/21/2018   INR 1.3 (H) 12/20/2018   ABG:  INR: Will add last result for INR, ABG once components are confirmed Will add last 4 CBG results once components are confirmed  Assessment/Plan:  1. CV - A fib with CVR. On Amiodarone 200 mg daily, Lopressor 25 mg bid, and Apixaban 5 mg bid.  2.  Pulmonary - On 2 L oxygen via [place] this am and she will require oxygen at discharge. On Xopenex 0.63 bid. Encourage incentive spirometer and flutter valve. Patient instructed to continue using both until seen by surgeon 3. Volume Overload - On Lasix 40 mg daily  4.  Acute blood loss anemia - Last H and H 9.2 and 30.4. Oral ferrous for one month at discharge 5. Deconditioned-instructed patient on the importance of walking several times daily in her house 6. Hypothyroidism-on Levothyroxine 150 mcg daily. 7. As discussed with Dr. Orvan Seen, patient to be discharged with home health, PT today  Donielle M ZimmermanPA-C 01/16/2019,7:11 AM

## 2019-01-16 NOTE — TOC Transition Note (Signed)
Transition of Care [facility]) - CM/SW Discharge Note Marvetta Gibbons RN, BSN Transitions of Care Unit 4E- RN Case Manager [phone]   Patient Details  Name: Kaitlyn Rodriguez MRN: [id number] Date of Birth: [birth year]  Transition of Care [facility]) CM/SW Contact:  Dawayne Patricia, RN Phone Number: 01/16/2019, 3:18 PM   Clinical Narrative:    Pt stable for transition home today, [employer] has delivered 3n1 and rollator for home, orders placed for home 02 and pt/son have agreed to pay out of pocket for home 02 as insurance will not cover. Call made to [facility] with [employer] for home 02 needs- once delivered to home- pt will d/c home via non emergent EMS per pt and son choice. [name] paperwork will be placed on shadow chart for transport along with GOLD DNR form. Cory with [facility] notified of discharge for start of care as per previous referral- for HHRN/PT.    Final next level of care: [place] Barriers to Discharge: Barriers Resolved   Patient Goals and CMS Choice Patient states their goals for this hospitalization and ongoing recovery are:: to do good CMS Medicare.gov Compare Post Acute Care list provided to:: Patient Represenative (must comment)(son Tommy) Choice offered to / list presented to : Adult Children  Discharge Placement             Home with [facility]          Discharge Plan and Services   Discharge Planning Services: [place] Acute Care Choice: [place]          DME Arranged: 3-N-1, Walker rolling with seat, Oxygen, Hospital bed DME Agency: [employer] Date DME Agency Contacted: 01/16/19 Time DME Agency Contacted: 1100 Representative spoke with at DME Agency: [place]: RN, PT [place] Agency: [place] Date [place]: 01/16/19 Time [place]: 1518 Representative spoke with at [place]: [place] ([place]) Interventions     Readmission Risk Interventions Readmission Risk  Prevention Plan 01/16/2019  Transportation Screening Complete  PCP or Specialist Appt within 5-7 Days Complete  Home Care Screening Complete  Medication Review (RN CM) Complete  Some recent data might be hidden

## 2019-01-16 NOTE — Progress Notes (Signed)
CARDIAC REHAB PHASE I   Offered to walk with pt, pt states she recently worked with PT. Stressed importance of ambulation and IS use at home. Pt states son is still trying to find help to get her into the house. RN and CM made aware.   [phone] Rufina Falco, RN BSN 01/16/2019 10:31 AM

## 2019-01-17 NOTE — Progress Notes (Signed)
Called the [name] and being redirected to [facility] non Emergency called center, the responder explained that "after 2200, every driver has his/her own list and they do not have a way of contacting them. She confirms that patient is on the list but does not know when the patient is going to be picked up. Patient is on the recliner and declines laying down.

## 2019-01-17 NOTE — Progress Notes (Signed)
[name] has arrived, patient's belongings (in 2 bags) given to Patient's and [name] team. Patient's son called and notified him that his mother is coming home and allow pt to talk to son.  VSS (see flow sheet) pt alert and oriented to situation, place and time. Pt left at [phone]

## 2019-01-17 NOTE — Progress Notes (Signed)
Patient has been waiting for [name] for pick up, when called, the responder at the call center confirmed that she is number 5 on the list. Pt is informed and she is anxious about not being picked at this time. We'll continue to monitor.

## 2019-01-19 ENCOUNTER — Telehealth: Payer: Self-pay

## 2019-01-19 ENCOUNTER — Other Ambulatory Visit: Payer: Self-pay

## 2019-01-19 NOTE — Telephone Encounter (Signed)
Pt called the office d/t concerns about taking amiodarone. She reports watching a news report yesterday about the potential dangers of taking this drug. She also says she believes it is causing her to feel bad. Discussed this w/ Dr. Orvan Seen, and he states pt can discontinue amiodarone. Pt notified.

## 2019-01-27 ENCOUNTER — Ambulatory Visit: Payer: Medicare Other | Admitting: Cardiology

## 2019-01-27 ENCOUNTER — Encounter: Payer: Self-pay | Admitting: *Deleted

## 2019-01-27 NOTE — Progress Notes (Signed)
Erich Montane, a physical therapist for Kaitlyn Rodriguez, called to relate that she is depressed, crying and resistant to exercising.  She is s/p REPAIR of an AORTIC DISSECTION with STERNAL PLATING on 12/20/18 and discharged on 01/16/19.  I explained that medications for depression are usually evaluated  by the primary physicians. Since she will be seen by Dr. Orvan Seen on 02/02/19 for her first post op visit, I'm sure he will assess her for the need to see her PCP.  Mr. Carney Corners understood.

## 2019-01-30 ENCOUNTER — Ambulatory Visit: Payer: Medicare Other | Admitting: Cardiology

## 2019-01-30 ENCOUNTER — Ambulatory Visit: Payer: Self-pay | Admitting: Cardiothoracic Surgery

## 2019-02-02 ENCOUNTER — Ambulatory Visit: Payer: Self-pay | Admitting: Cardiothoracic Surgery

## 2019-02-09 ENCOUNTER — Ambulatory Visit: Payer: Self-pay | Admitting: Cardiothoracic Surgery

## 2019-02-16 ENCOUNTER — Other Ambulatory Visit: Payer: Self-pay | Admitting: Cardiothoracic Surgery

## 2019-02-16 ENCOUNTER — Other Ambulatory Visit: Payer: Self-pay

## 2019-02-16 ENCOUNTER — Ambulatory Visit
Admission: RE | Admit: 2019-02-16 | Discharge: 2019-02-16 | Disposition: A | Discharge status: Home / Self Care | Payer: Medicare Other | Source: Ambulatory Visit | Attending: Cardiothoracic Surgery | Admitting: Cardiothoracic Surgery

## 2019-02-16 ENCOUNTER — Ambulatory Visit (INDEPENDENT_AMBULATORY_CARE_PROVIDER_SITE_OTHER): Payer: Self-pay | Admitting: Cardiothoracic Surgery

## 2019-02-16 VITALS — BP 123/80 | HR 80 | Temp 97.7°F | Resp 20 | Ht 66.0 in | Wt 212.0 lb

## 2019-02-16 DIAGNOSIS — Z9889 Other specified postprocedural states: Secondary | ICD-10-CM

## 2019-02-16 DIAGNOSIS — Z95828 Presence of other vascular implants and grafts: Secondary | ICD-10-CM

## 2019-02-16 DIAGNOSIS — I7100 Dissection of unspecified site of aorta: Secondary | ICD-10-CM

## 2019-02-16 DIAGNOSIS — Z8679 Personal history of other diseases of the circulatory system: Secondary | ICD-10-CM

## 2019-02-16 DIAGNOSIS — Z952 Presence of prosthetic heart valve: Secondary | ICD-10-CM

## 2019-02-16 DIAGNOSIS — I719 Aortic aneurysm of unspecified site, without rupture: Secondary | ICD-10-CM

## 2019-02-16 DIAGNOSIS — Z09 Encounter for follow-up examination after completed treatment for conditions other than malignant neoplasm: Secondary | ICD-10-CM

## 2019-02-16 MED ORDER — NYSTATIN 100000 UNIT/GM EX POWD: 1.0000 "application " | Freq: Three times a day (TID) | CUTANEOUS | 0 refills | Status: DC

## 2019-02-16 MED ORDER — FUROSEMIDE 40 MG PO TABS: 40.0000 mg | ORAL_TABLET | Freq: Two times a day (BID) | ORAL | 11 refills | Status: DC

## 2019-02-17 NOTE — Progress Notes (Signed)
[address]       ,[place] [address]             [phone]     CARDIOTHORACIC SURGERY OFFICE NOTE  Referring Provider is Sherren Mocha, MD Primary Cardiologist is No primary care provider on file. PCP is Patient, No Pcp Per   HPI:  85 yo lady nearly 2 mos after repair of acute aortic emergency which was intramural hematoma. She did reasonably well after surgery but she did require very aggressive diuresis and was generally quite debilitated. Ultimately she made it home and has been doing ok. Working with home PT. Denies f/c. Is not keeping feet elevated, therefore, LEs are very edematous   Current Outpatient Medications  Medication Sig Dispense Refill  . apixaban (ELIQUIS) 5 MG TABS tablet Take 1 tablet (5 mg total) by mouth 2 (two) times daily. 60 tablet 1  . aspirin 81 MG chewable tablet Chew 1 tablet (81 mg total) by mouth daily.    Marland Kitchen levothyroxine (SYNTHROID) 150 MCG tablet Take 150 mcg by mouth daily before breakfast.    . metoprolol succinate (TOPROL XL) 50 MG 24 hr tablet Take 1 tablet (50 mg total) by mouth daily. Take with or immediately following a meal. 30 tablet 1  . Multiple Vitamin (MULTIVITAMIN WITH MINERALS) TABS tablet Take 1 tablet by mouth daily.    . ferrous sulfate 325 (65 FE) MG tablet Take 1 tablet (325 mg total) by mouth daily. For one month then stop. If develops constipation, may stop sooner (Patient not taking: Reported on 02/16/2019) 30 tablet 0  . furosemide (LASIX) 40 MG tablet Take 1 tablet (40 mg total) by mouth 2 (two) times daily. 60 tablet 11  . nystatin (MYCOSTATIN/NYSTOP) powder Apply 1 application topically 3 (three) times daily. 15 g 0  . potassium chloride 20 MEQ TBCR Take 20 mEq by mouth daily. (Patient not taking: Reported on 02/16/2019) 30 tablet 1   No current facility-administered medications for this visit.      Physical Exam:   BP 123/80 (BP Location: Left Arm)   Pulse 80   Temp 97.7 F (36.5 C) (Skin)    Resp 20   Ht 5\' 6"  (1.676 m)   Wt 96.2 kg   SpO2 98% Comment: on O2 @ 2 L  BMI 34.22 kg/m   General:  Frail, elderly, NAD  Chest:   Decreased BS at bilateral bases  CV:   irreg irreg rhythm  Incisions:  C/d/i  Abdomen:  sntnd  Extremities:  3+ edema  Diagnostic Tests:  CXR with bilateral pleural effusions   Impression:  Doing reasonably well after repair of Type A aortic dissection/[facility] on 12/20/18  Plan:  F/u with thoracic surgery as needed Contact primary care Continue Lasix 40 mg po BID until LE edema is improved Have ordered CBC, B12, and Bmet as she and son have requested  I spent in excess of 46minutes during the conduct of this office consultation and >50% of this time involved direct face-to-face encounter with the patient for counseling and/or coordination of their care.  Level 2                 10 minutes Level 3                 15 minutes Level 4                 25 minutes Level 5  40 minutes  B. Lorayne Marek, MD 02/17/2019 11:10 AM

## 2019-03-02 ENCOUNTER — Other Ambulatory Visit: Payer: Self-pay | Admitting: Cardiothoracic Surgery

## 2019-03-03 ENCOUNTER — Other Ambulatory Visit: Payer: Self-pay | Admitting: Cardiothoracic Surgery

## 2019-09-16 ENCOUNTER — Ambulatory Visit: Payer: Self-pay | Admitting: Cardiology

## 2020-05-16 ENCOUNTER — Telehealth: Payer: Self-pay

## 2020-05-16 NOTE — Telephone Encounter (Signed)
Notes on file.  

## 2020-05-24 ENCOUNTER — Telehealth: Payer: Self-pay | Admitting: Cardiology

## 2020-05-24 NOTE — Telephone Encounter (Signed)
Please fax notes on file to [facility] office. Patient scheduled for 04/14 with Dr. Servando Salina

## 2020-06-01 ENCOUNTER — Telehealth: Payer: Self-pay

## 2020-06-01 NOTE — Telephone Encounter (Signed)
Spoke with patient's son. He states the patient get flustered when she has to fill out new patient forms and would like a copy mailed out to them. After asking the front desk staff it was decided they would mail the paperwork to the patient. Her son was informed that thanked [place] for working with them.

## 2020-06-08 ENCOUNTER — Other Ambulatory Visit: Payer: Self-pay

## 2020-06-08 DIAGNOSIS — D519 Vitamin B12 deficiency anemia, unspecified: Secondary | ICD-10-CM | POA: Insufficient documentation

## 2020-06-08 DIAGNOSIS — Z9981 Dependence on supplemental oxygen: Secondary | ICD-10-CM | POA: Insufficient documentation

## 2020-06-08 DIAGNOSIS — I10 Essential (primary) hypertension: Secondary | ICD-10-CM | POA: Insufficient documentation

## 2020-06-08 DIAGNOSIS — E079 Disorder of thyroid, unspecified: Secondary | ICD-10-CM | POA: Insufficient documentation

## 2020-06-08 DIAGNOSIS — I5041 Acute combined systolic (congestive) and diastolic (congestive) heart failure: Secondary | ICD-10-CM | POA: Insufficient documentation

## 2020-06-08 DIAGNOSIS — I509 Heart failure, unspecified: Secondary | ICD-10-CM | POA: Insufficient documentation

## 2020-06-08 DIAGNOSIS — I5042 Chronic combined systolic (congestive) and diastolic (congestive) heart failure: Secondary | ICD-10-CM | POA: Insufficient documentation

## 2020-06-08 DIAGNOSIS — Z952 Presence of prosthetic heart valve: Secondary | ICD-10-CM | POA: Insufficient documentation

## 2020-06-08 DIAGNOSIS — M1990 Unspecified osteoarthritis, unspecified site: Secondary | ICD-10-CM | POA: Insufficient documentation

## 2020-06-08 DIAGNOSIS — E039 Hypothyroidism, unspecified: Secondary | ICD-10-CM | POA: Insufficient documentation

## 2020-06-08 DIAGNOSIS — F419 Anxiety disorder, unspecified: Secondary | ICD-10-CM | POA: Insufficient documentation

## 2020-06-08 DIAGNOSIS — F418 Other specified anxiety disorders: Secondary | ICD-10-CM | POA: Insufficient documentation

## 2020-06-09 ENCOUNTER — Other Ambulatory Visit: Payer: Self-pay

## 2020-06-09 ENCOUNTER — Ambulatory Visit: Payer: Medicare Other | Admitting: Cardiology

## 2020-06-09 ENCOUNTER — Encounter: Payer: Self-pay | Admitting: Cardiology

## 2020-06-09 VITALS — BP 150/92 | HR 106 | Ht 67.0 in | Wt 201.0 lb

## 2020-06-09 DIAGNOSIS — I4821 Permanent atrial fibrillation: Secondary | ICD-10-CM | POA: Diagnosis not present

## 2020-06-09 DIAGNOSIS — Z8679 Personal history of other diseases of the circulatory system: Secondary | ICD-10-CM

## 2020-06-09 DIAGNOSIS — I7101 Dissection of ascending aorta: Secondary | ICD-10-CM

## 2020-06-09 DIAGNOSIS — I4819 Other persistent atrial fibrillation: Secondary | ICD-10-CM

## 2020-06-09 DIAGNOSIS — Z9889 Other specified postprocedural states: Secondary | ICD-10-CM

## 2020-06-09 DIAGNOSIS — I712 Thoracic aortic aneurysm, without rupture: Secondary | ICD-10-CM

## 2020-06-09 DIAGNOSIS — R0602 Shortness of breath: Secondary | ICD-10-CM

## 2020-06-09 DIAGNOSIS — E669 Obesity, unspecified: Secondary | ICD-10-CM

## 2020-06-09 DIAGNOSIS — I5032 Chronic diastolic (congestive) heart failure: Secondary | ICD-10-CM

## 2020-06-09 DIAGNOSIS — I10 Essential (primary) hypertension: Secondary | ICD-10-CM

## 2020-06-09 MED ORDER — METOPROLOL SUCCINATE ER 25 MG PO TB24: ORAL_TABLET | ORAL | 3 refills | Status: DC

## 2020-06-09 NOTE — Patient Instructions (Addendum)
Medication Instructions:  Your physician has recommended you make the following change in your medication:  INCREASE: Metoprolol succinate 50 mg in the morning 25 mg at night *If you need a refill on your cardiac medications before your next appointment, please call your pharmacy*   Lab Work: None If you have labs (blood work) drawn today and your tests are completely normal, you will receive your results only by: Marland Kitchen MyChart Message (if you have MyChart) OR . A paper copy in the mail If you have any lab test that is abnormal or we need to change your treatment, we will call you to review the results.   Testing/Procedures: Your physician has requested that you have an echocardiogram. Echocardiography is a painless test that uses sound waves to create images of your heart. It provides your doctor with information about the size and shape of your heart and how well your heart's chambers and valves are working. This procedure takes approximately one hour. There are no restrictions for this procedure.   Follow-Up: At [facility], you and your health needs are our priority.  As part of our continuing mission to provide you with exceptional heart care, we have created designated Provider Care Teams.  These Care Teams include your primary Cardiologist (physician) and Advanced Practice Providers (APPs -  Physician Assistants and Nurse Practitioners) who all work together to provide you with the care you need, when you need it.  We recommend signing up for the patient portal called "MyChart".  Sign up information is provided on this After Visit Summary.  MyChart is used to connect with patients for Virtual Visits (Telemedicine).  Patients are able to view lab/test results, encounter notes, upcoming appointments, etc.  Non-urgent messages can be sent to your provider as well.   To learn more about what you can do with MyChart, go to [email or web address].    Your next appointment:   6  week(s)  The format for your next appointment:   In Person  Provider:   Thomasene Ripple, DO   Other Instructions  Echocardiogram An echocardiogram is a test that uses sound waves (ultrasound) to produce images of the heart. Images from an echocardiogram can provide important information about:  Heart size and shape.  The size and thickness and movement of your heart's walls.  Heart muscle function and strength.  Heart valve function or if you have stenosis. Stenosis is when the heart valves are too narrow.  If blood is flowing backward through the heart valves (regurgitation).  A tumor or infectious growth around the heart valves.  Areas of heart muscle that are not working well because of poor blood flow or injury from a heart attack.  Aneurysm detection. An aneurysm is a weak or damaged part of an artery wall. The wall bulges out from the normal force of blood pumping through the body. Tell a health care provider about:  Any allergies you have.  All medicines you are taking, including vitamins, herbs, eye drops, creams, and over-the-counter medicines.  Any blood disorders you have.  Any surgeries you have had.  Any medical conditions you have.  Whether you are pregnant or may be pregnant. What are the risks? Generally, this is a safe test. However, problems may occur, including an allergic reaction to dye (contrast) that may be used during the test. What happens before the test? No specific preparation is needed. You may eat and drink normally. What happens during the test?  You will take off your clothes from  the waist up and put on a hospital gown.  Electrodes or electrocardiogram (ECG)patches may be placed on your chest. The electrodes or patches are then connected to a device that monitors your heart rate and rhythm.  You will lie down on a table for an ultrasound exam. A gel will be applied to your chest to help sound waves pass through your skin.  A handheld  device, called a transducer, will be pressed against your chest and moved over your heart. The transducer produces sound waves that travel to your heart and bounce back (or "echo" back) to the transducer. These sound waves will be captured in real-time and changed into images of your heart that can be viewed on a video monitor. The images will be recorded on a computer and reviewed by your health care provider.  You may be asked to change positions or hold your breath for a short time. This makes it easier to get different views or better views of your heart.  In some cases, you may receive contrast through an IV in one of your veins. This can improve the quality of the pictures from your heart. The procedure may vary among health care providers and hospitals.   What can I expect after the test? You may return to your normal, everyday life, including diet, activities, and medicines, unless your health care provider tells you not to do that. Follow these instructions at home:  It is up to you to get the results of your test. Ask your health care provider, or the department that is doing the test, when your results will be ready.  Keep all follow-up visits. This is important. Summary  An echocardiogram is a test that uses sound waves (ultrasound) to produce images of the heart.  Images from an echocardiogram can provide important information about the size and shape of your heart, heart muscle function, heart valve function, and other possible heart problems.  You do not need to do anything to prepare before this test. You may eat and drink normally.  After the echocardiogram is completed, you may return to your normal, everyday life, unless your health care provider tells you not to do that. This information is not intended to replace advice given to you by your health care provider. Make sure you discuss any questions you have with your health care provider. Document Revised: 10/06/2019  Document Reviewed: 10/06/2019 Elsevier Patient Education  2021 [employer].

## 2020-06-09 NOTE — Progress Notes (Signed)
Cardiology Office Note:    Date:  06/10/2020   ID:  Kaitlyn Rodriguez, DOB Dec 15, 1933, MRN [id number]  PCP:  Timoteo Gaul, FNP  Cardiologist:  No primary care provider on file.  Electrophysiologist:  None   Referring MD: Timoteo Gaul, FNP   I feel short of breath at times  History of Present Illness:    Kaitlyn Rodriguez is a 87 y.o. female with a hx of repair type a aortic dissection in 2020, A. Fib, hypothyroidism is here today for establish cardiac care.  Since her emergent surgery in 2020 for her type a aortic dissection the patient had follow-up with cardiothoracic surgery and had been advised multiple times to see cardiology she tells me but she has not been able to to make this appointment.  She is here today with her son.  She denies any chest pain but tells me at times she is short of breath and fatigue.  She notes that recently her thyroid medication was adjusted by her PCP as her TSH was very low.   Past Medical History:  Diagnosis Date  . Anxiety disorder, unspecified   . Benign essential hypertension   . Dissection of unspecified site of aorta ([place]) 11/2018  . Heart failure, unspecified ([place])   . History of aortic valve replacement   . Hypothyroidism, unspecified   . Unspecified osteoarthritis, unspecified site   . Vitamin B12 deficiency anemia     Past Surgical History:  Procedure Laterality Date  . ABDOMINAL HYSTERECTOMY     at age 11  . REPLACEMENT ASCENDING AORTA N/A 12/20/2018   Procedure: REPAIR OF TYPE A AORTIC DISSECTION USING HEMASHIELD PLATINUM 28MM AND 32MM GRAFT;  Surgeon: Wonda Olds, MD;  Location: [facility];  Service: Open Heart Surgery;  Laterality: N/A;  . STERNAL CLOSURE  12/20/2018   Procedure: STERNAL PLATING;  Surgeon: Wonda Olds, MD;  Location: [facility];  Service: Open Heart Surgery;;    Current Medications: Current Meds  Medication Sig  . apixaban (ELIQUIS) 5 MG TABS tablet Take 1 tablet (5 mg total) by mouth 2 (two) times  daily.  . cyanocobalamin (,VITAMIN B-12,) 1000 MCG/ML injection Inject 1,000 mcg into the muscle every 30 (thirty) days.  . ferrous sulfate 325 (65 FE) MG tablet Take 325 mg by mouth daily with breakfast.  . furosemide (LASIX) 40 MG tablet Take 40 mg by mouth daily.  . metoprolol succinate (TOPROL XL) 25 MG 24 hr tablet Take 50 mg in the morning (two tablets) and 25 mg at night (one tablet)  . Multiple Vitamin (MULTIVITAMIN WITH MINERALS) TABS tablet Take 1 tablet by mouth daily.  Marland Kitchen SYNTHROID 125 MCG tablet Take 125 mcg by mouth every morning.  . [DISCONTINUED] metoprolol succinate (TOPROL-XL) 50 MG 24 hr tablet Take 50 mg by mouth daily. Take with or immediately following a meal.     Allergies:   Patient has no known allergies.   Social History   Socioeconomic History  . Marital status: Widowed    Spouse name: Not on file  . Number of children: Not on file  . Years of education: Not on file  . Highest education level: Not on file  Occupational History  . Not on file  Tobacco Use  . Smoking status: Never Smoker  . Smokeless tobacco: Never Used  Substance and Sexual Activity  . Alcohol use: Yes  . Drug use: Never  . Sexual activity: Not on file  Other Topics Concern  . Not  on file  Social History Narrative  . Not on file   Social Determinants of Health   Financial Resource Strain: Not on file  Food Insecurity: Not on file  Transportation Needs: Not on file  Physical Activity: Not on file  Stress: Not on file  Social Connections: Not on file     Family History: The patient's family history is not on file.  ROS:   Review of Systems  Constitution: Negative for decreased appetite, fever and weight gain.  HENT: Negative for congestion, ear discharge, hoarse voice and sore throat.   Eyes: Negative for discharge, redness, vision loss in right eye and visual halos.  Cardiovascular: Reports shortness of breath.  Negative for chest pain, leg swelling, orthopnea and  palpitations.  Respiratory: Negative for cough, hemoptysis, shortness of breath and snoring.   Endocrine: Negative for heat intolerance and polyphagia.  Hematologic/Lymphatic: Negative for bleeding problem. Does not bruise/bleed easily.  Skin: Negative for flushing, nail changes, rash and suspicious lesions.  Musculoskeletal: Negative for arthritis, joint pain, muscle cramps, myalgias, neck pain and stiffness.  Gastrointestinal: Negative for abdominal pain, bowel incontinence, diarrhea and excessive appetite.  Genitourinary: Negative for decreased libido, genital sores and incomplete emptying.  Neurological: Negative for brief paralysis, focal weakness, headaches and loss of balance.  Psychiatric/Behavioral: Negative for altered mental status, depression and suicidal ideas.  Allergic/Immunologic: Negative for HIV exposure and persistent infections.    EKGs/Labs/Other Studies Reviewed:    The following studies were reviewed today:   EKG:  The ekg ordered today demonstrates atrial fibrillation rapid ventricular rate heart rate 106 bpm  Transthoracic echocardiogram December 26, 2018 IMPRESSIONS  1. Left ventricular ejection fraction, by visual estimation, is 50 to 55%. The left ventricle has normal function. There is no left ventricular hypertrophy.  2. Global right ventricle has mildly reduced systolic function.The right ventricular size is mildly enlarged. No increase in right ventricular wall  thickness.  3. Left atrial size was not assessed.  4. Right atrial size was not assessed.  5. Small pericardial effusion.  6. The mitral valve is grossly normal. No evidence of mitral valve  regurgitation.  7. The tricuspid valve is grossly normal. Tricuspid valve regurgitation  is mild.  8. The aortic valve is grossly normal. Aortic valve regurgitation is not  visualized.  9. The pulmonic valve was not assessed. Pulmonic valve regurgitation is  not visualized.  10. Aortic root could  not be assessed.  11. LVEF improved from the prior study, now 50-55% with paradoxical septal  motion.  12. RVEF mildly decreased.  13. Small amount pf pericardial effusion with no signs of tamponade.  14. The interatrial septum was not assessed.   FINDINGS  Left Ventricle: Left ventricular ejection fraction, by visual estimation,  is 50 to 55%. The left ventricle has normal function. There is no left  ventricular hypertrophy.   Right Ventricle: The right ventricular size is mildly enlarged. No  increase in right ventricular wall thickness. Global RV systolic function  is has mildly reduced systolic function.   Left Atrium: Left atrial size was not assessed.   Right Atrium: Right atrial size was not assessed   Pericardium: A small pericardial effusion is present.   Mitral Valve: The mitral valve is grossly normal. No evidence of mitral  valve regurgitation.   Tricuspid Valve: The tricuspid valve is grossly normal. Tricuspid valve  regurgitation is mild.   Aortic Valve: The aortic valve is grossly normal. Aortic valve  regurgitation is not visualized.  Pulmonic Valve: The pulmonic valve was not assessed. Pulmonic valve  regurgitation is not visualized.   Aorta: Aortic root could not be assessed.   Shunts: The interatrial septum was not assessed.   Additional Comments: LVEF improved from the prior study, now 50-55% with  paradoxical septal motion. RVEF mildly decreased. Small amount pf  pericardial effusion with no signs of tamponade.     Recent Labs: No results found for requested labs within last 8760 hours.  Recent blood work in March with her PCP show sodium 134, potassium 4.5, chloride 92, bicarb 27, BUN 70, creatinine 0.84, glucose 67 CBC: WBC 8.2, hemoglobin 15.4, hematocrit 94, platelet 238 Calcium 9.5, AST 19, ALT 13, alk phos 77  Recent Lipid Panel    Component Value Date/Time   CHOL 86 12/22/2018 0407   TRIG 58 12/22/2018 0407   HDL 22 (L) 12/22/2018  0407   CHOLHDL 3.9 12/22/2018 0407   VLDL 12 12/22/2018 0407   LDLCALC 52 12/22/2018 0407  Lipid profile in March 2022 triglycerides 124, total cholesterol 163, HDL 52, LDL 88  Physical Exam:    VS:  BP (!) 150/92 (BP Location: Left Arm)   Pulse (!) 106   Ht '5\' 7"'  (1.702 m)   Wt 201 lb (91.2 kg)   SpO2 94%   BMI 31.48 kg/m     Wt Readings from Last 3 Encounters:  06/09/20 201 lb (91.2 kg)  02/16/19 212 lb (96.2 kg)  01/16/19 214 lb 4.6 oz (97.2 kg)     GEN: Well nourished, well developed in no acute distress HEENT: Normal NECK: No JVD; No carotid bruits LYMPHATICS: No lymphadenopathy CARDIAC: S1S2 noted,RRR, no murmurs, rubs, gallops RESPIRATORY:  Clear to auscultation without rales, wheezing or rhonchi  ABDOMEN: Soft, non-tender, non-distended, +bowel sounds, no guarding. EXTREMITIES: No edema, No cyanosis, no clubbing MUSCULOSKELETAL:  No deformity  SKIN: Warm and dry NEUROLOGIC:  Alert and oriented x 3, non-focal PSYCHIATRIC:  Normal affect, good insight  ASSESSMENT:    1. SOB (shortness of breath)   2. 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 Permanent atrial fibrillation ([place])   4. Chronic diastolic heart failure ([place])   5. Benign essential hypertension   6. S/P aortic aneurysm repair   7. Obesity (BMI 30-39.9)    PLAN:     She is short of breath in his office today.  This could be multifactorial for atelectasis LV/RV function.  She also has seen atrial fibrillation with a rapid ventricular rate were going to go ahead and slow down her rate hopefully she can convert once her rate is slowed down and we can increase her Toprol-XL from 50 mg in the morning to 50 mg in the morning and 25 mg at night.  She takes Eliquis we will continue this.  She was previously on amiodarone I talked to the patient about getting back on amiodarone and she has declined she says that she does not want to take this medication anymore-she notes every time she  takes it it makes her feel like she is dying.  She is hypertensive in the office we certainly with her history of aortic dissection needs to keep her blood pressure controlled.  She is hesitant about adding additional medication to the beta-blockers because I have increased this today.  We will continue to keep a close eye on her with this increase and if needed we will optimize his antihypertensive regimen to make sure thatHer blood pressure is staying  below target.  The patient is in agreement with the above plan. The patient left the office in stable condition.  The patient will follow up in 6 weeks   Medication Adjustments/Labs and Tests Ordered: Current medicines are reviewed at length with the patient today.  Concerns regarding medicines are outlined above.  Orders Placed This Encounter  Procedures  . EKG 12-Lead  . ECHOCARDIOGRAM COMPLETE   Meds ordered this encounter  Medications  . metoprolol succinate (TOPROL XL) 25 MG 24 hr tablet    Sig: Take 50 mg in the morning (two tablets) and 25 mg at night (one tablet)    Dispense:  180 tablet    Refill:  3    Patient Instructions   Medication Instructions:  Your physician has recommended you make the following change in your medication:  INCREASE: Metoprolol succinate 50 mg in the morning 25 mg at night *If you need a refill on your cardiac medications before your next appointment, please call your pharmacy*   Lab Work: None If you have labs (blood work) drawn today and your tests are completely normal, you will receive your results only by: Marland Kitchen MyChart Message (if you have MyChart) OR . A paper copy in the mail If you have any lab test that is abnormal or we need to change your treatment, we will call you to review the results.   Testing/Procedures: Your physician has requested that you have an echocardiogram. Echocardiography is a painless test that uses sound waves to create images of your heart. It provides your doctor with  information about the size and shape of your heart and how well your heart's chambers and valves are working. This procedure takes approximately one hour. There are no restrictions for this procedure.   Follow-Up: At [facility], you and your health needs are our priority.  As part of our continuing mission to provide you with exceptional heart care, we have created designated Provider Care Teams.  These Care Teams include your primary Cardiologist (physician) and Advanced Practice Providers (APPs -  Physician Assistants and Nurse Practitioners) who all work together to provide you with the care you need, when you need it.  We recommend signing up for the patient portal called "MyChart".  Sign up information is provided on this After Visit Summary.  MyChart is used to connect with patients for Virtual Visits (Telemedicine).  Patients are able to view lab/test results, encounter notes, upcoming appointments, etc.  Non-urgent messages can be sent to your provider as well.   To learn more about what you can do with MyChart, go to [email or web address].    Your next appointment:   6 week(s)  The format for your next appointment:   In Person  Provider:   Berniece Salines, DO   Other Instructions  Echocardiogram An echocardiogram is a test that uses sound waves (ultrasound) to produce images of the heart. Images from an echocardiogram can provide important information about:  Heart size and shape.  The size and thickness and movement of your heart's walls.  Heart muscle function and strength.  Heart valve function or if you have stenosis. Stenosis is when the heart valves are too narrow.  If blood is flowing backward through the heart valves (regurgitation).  A tumor or infectious growth around the heart valves.  Areas of heart muscle that are not working well because of poor blood flow or injury from a heart attack.  Aneurysm detection. An aneurysm is a weak or damaged part of an  artery wall. The wall bulges out from the normal force of blood pumping through the body. Tell a health care provider about:  Any allergies you have.  All medicines you are taking, including vitamins, herbs, eye drops, creams, and over-the-counter medicines.  Any blood disorders you have.  Any surgeries you have had.  Any medical conditions you have.  Whether you are pregnant or may be pregnant. What are the risks? Generally, this is a safe test. However, problems may occur, including an allergic reaction to dye (contrast) that may be used during the test. What happens before the test? No specific preparation is needed. You may eat and drink normally. What happens during the test?  You will take off your clothes from the waist up and put on a hospital gown.  Electrodes or electrocardiogram (ECG)patches may be placed on your chest. The electrodes or patches are then connected to a device that monitors your heart rate and rhythm.  You will lie down on a table for an ultrasound exam. A gel will be applied to your chest to help sound waves pass through your skin.  A handheld device, called a transducer, will be pressed against your chest and moved over your heart. The transducer produces sound waves that travel to your heart and bounce back (or "echo" back) to the transducer. These sound waves will be captured in real-time and changed into images of your heart that can be viewed on a video monitor. The images will be recorded on a computer and reviewed by your health care provider.  You may be asked to change positions or hold your breath for a short time. This makes it easier to get different views or better views of your heart.  In some cases, you may receive contrast through an IV in one of your veins. This can improve the quality of the pictures from your heart. The procedure may vary among health care providers and hospitals.   What can I expect after the test? You may return to  your normal, everyday life, including diet, activities, and medicines, unless your health care provider tells you not to do that. Follow these instructions at home:  It is up to you to get the results of your test. Ask your health care provider, or the department that is doing the test, when your results will be ready.  Keep all follow-up visits. This is important. Summary  An echocardiogram is a test that uses sound waves (ultrasound) to produce images of the heart.  Images from an echocardiogram can provide important information about the size and shape of your heart, heart muscle function, heart valve function, and other possible heart problems.  You do not need to do anything to prepare before this test. You may eat and drink normally.  After the echocardiogram is completed, you may return to your normal, everyday life, unless your health care provider tells you not to do that. This information is not intended to replace advice given to you by your health care provider. Make sure you discuss any questions you have with your health care provider. Document Revised: 10/06/2019 Document Reviewed: 10/06/2019 Elsevier Patient Education  2021 [employer].      Adopting a Healthy Lifestyle.  Know what a healthy weight is for you (roughly BMI <25) and aim to maintain this   Aim for 7+ servings of fruits and vegetables daily   65-80+ fluid ounces of water or unsweet tea for healthy kidneys   Limit to max 1  drink of alcohol per day; avoid smoking/tobacco   Limit animal fats in diet for cholesterol and heart health - choose grass fed whenever available   Avoid highly processed foods, and foods high in saturated/trans fats   Aim for low stress - take time to unwind and care for your mental health   Aim for 150 min of moderate intensity exercise weekly for heart health, and weights twice weekly for bone health   Aim for 7-9 hours of sleep daily   When it comes to diets, agreement  about the perfect plan isnt easy to find, even among the experts. Experts at the [employer] developed an idea known as the Healthy Eating Plate. Just imagine a plate divided into logical, healthy portions.   The emphasis is on diet quality:   Load up on vegetables and fruits - one-half of your plate: Aim for color and variety, and remember that potatoes dont count.   Go for whole grains - one-quarter of your plate: Whole wheat, barley, wheat berries, quinoa, oats, brown rice, and foods made with them. If you want pasta, go with whole wheat pasta.   Protein power - one-quarter of your plate: Fish, chicken, beans, and nuts are all healthy, versatile protein sources. Limit red meat.   The diet, however, does go beyond the plate, offering a few other suggestions.   Use healthy plant oils, such as olive, canola, soy, corn, sunflower and peanut. Check the labels, and avoid partially hydrogenated oil, which have unhealthy trans fats.   If youre thirsty, drink water. Coffee and tea are good in moderation, but skip sugary drinks and limit milk and dairy products to one or two daily servings.   The type of carbohydrate in the diet is more important than the amount. Some sources of carbohydrates, such as vegetables, fruits, whole grains, and beans-are healthier than others.   Finally, stay active  Signed, Berniece Salines, DO  06/10/2020 8:18 AM    [place]

## 2020-06-17 ENCOUNTER — Telehealth: Payer: Self-pay | Admitting: Cardiology

## 2020-06-17 NOTE — Telephone Encounter (Signed)
Pt c/o medication issue:  1. Name of Medication: metoprolol succinate (TOPROL XL) 25 MG 24 hr tablet  2. How are you currently taking this medication (dosage and times per day)? As Prescribed  3. Are you having a reaction (difficulty breathing--STAT)?No  4. What is your medication issue? Pt is calling with concerns with this medication.PT is stating once taking the medication she is becoming very nervous and cant sleep.She wants to know is there another medication she can be put on.PT also wants to know can she be prescribedCelexa for her aneixty,her brother has taken it before.

## 2020-06-20 NOTE — Telephone Encounter (Signed)
We can hold the metoprolol succinate. Please let the patient know she will need to speak to her pcp to order the celexa for her anxiety.

## 2020-06-20 NOTE — Addendum Note (Signed)
Addended by: Eleonore Chiquito on: 06/20/2020 11:18 AM   Modules accepted: Orders

## 2020-06-20 NOTE — Telephone Encounter (Signed)
Recommendations reviewed with pt as per Dr. Tobb's note.  Pt verbalized understanding and had no additional questions.  

## 2020-07-01 ENCOUNTER — Telehealth: Payer: Self-pay | Admitting: *Deleted

## 2020-07-01 NOTE — Telephone Encounter (Signed)
Kaitlyn Rodriguez called with questions as to why she is so tired. Patient has a hx of aortic dissection repair in 2020 by Dr. Vickey Sages. Patient advised to live a healthy lifestyle by consuming healthy foods, drinking plenty of water and remaining active. Advised patient to follow up with her PCP as well as cardiologist. No further questions at this time.

## 2020-07-12 ENCOUNTER — Ambulatory Visit (INDEPENDENT_AMBULATORY_CARE_PROVIDER_SITE_OTHER): Payer: Medicare Other

## 2020-07-12 ENCOUNTER — Other Ambulatory Visit: Payer: Self-pay

## 2020-07-12 DIAGNOSIS — R0602 Shortness of breath: Secondary | ICD-10-CM | POA: Diagnosis not present

## 2020-07-12 NOTE — Progress Notes (Signed)
Complete echocardiogram performed.  Jimmy Harjot Dibello RDCS, RVT  

## 2020-07-13 LAB — ECHOCARDIOGRAM COMPLETE
Area-P 1/2: 5.34 cm2
Calc EF: 39.7 %
MV M vel: 5.38 m/s
MV Peak grad: 115.8 mmHg
Radius: 0.8 cm
S' Lateral: 3.9 cm
Single Plane A2C EF: 42 %
Single Plane A4C EF: 39.5 %

## 2020-07-19 ENCOUNTER — Telehealth: Payer: Self-pay | Admitting: Cardiology

## 2020-07-19 NOTE — Telephone Encounter (Signed)
Kaitlyn Rodriguez is calling requesting her Echo results. Please advise.

## 2020-07-19 NOTE — Telephone Encounter (Signed)
Spoke with patient, see chart.    

## 2020-07-19 NOTE — Telephone Encounter (Signed)
Follow up:     Patient calling to check the status of her ECHO results and states she has not heard from anyone.

## 2020-07-27 ENCOUNTER — Encounter: Payer: Self-pay | Admitting: Cardiology

## 2020-07-27 ENCOUNTER — Ambulatory Visit: Payer: Medicare Other | Admitting: Cardiology

## 2020-07-27 ENCOUNTER — Other Ambulatory Visit: Payer: Self-pay

## 2020-07-27 VITALS — BP 130/90 | HR 90 | Ht 67.0 in | Wt 206.2 lb

## 2020-07-27 DIAGNOSIS — I4819 Other persistent atrial fibrillation: Secondary | ICD-10-CM

## 2020-07-27 DIAGNOSIS — I340 Nonrheumatic mitral (valve) insufficiency: Secondary | ICD-10-CM

## 2020-07-27 DIAGNOSIS — I272 Pulmonary hypertension, unspecified: Secondary | ICD-10-CM

## 2020-07-27 DIAGNOSIS — I7101 Dissection of ascending aorta: Secondary | ICD-10-CM

## 2020-07-27 DIAGNOSIS — Z9889 Other specified postprocedural states: Secondary | ICD-10-CM | POA: Diagnosis not present

## 2020-07-27 DIAGNOSIS — Z8679 Personal history of other diseases of the circulatory system: Secondary | ICD-10-CM

## 2020-07-27 DIAGNOSIS — I351 Nonrheumatic aortic (valve) insufficiency: Secondary | ICD-10-CM

## 2020-07-27 DIAGNOSIS — I712 Thoracic aortic aneurysm, without rupture: Secondary | ICD-10-CM

## 2020-07-27 DIAGNOSIS — E669 Obesity, unspecified: Secondary | ICD-10-CM

## 2020-07-27 DIAGNOSIS — I7121 Aneurysm of the ascending aorta, without rupture: Secondary | ICD-10-CM

## 2020-07-27 HISTORY — DX: Pulmonary hypertension, unspecified: I27.20

## 2020-07-27 HISTORY — DX: Nonrheumatic mitral (valve) insufficiency: I34.0

## 2020-07-27 HISTORY — DX: Obesity, unspecified: E66.9

## 2020-07-27 HISTORY — DX: Nonrheumatic aortic (valve) insufficiency: I35.1

## 2020-07-27 MED ORDER — METOPROLOL SUCCINATE ER 50 MG PO TB24: 50.0000 mg | ORAL_TABLET | Freq: Every day | ORAL | 3 refills | Status: DC

## 2020-07-27 NOTE — Patient Instructions (Signed)
Medication Instructions:  Your physician recommends that you continue on your current medications as directed. Please refer to the Current Medication list given to you today.   *If you need a refill on your cardiac medications before your next appointment, please call your pharmacy*   Lab Work: Your physician recommends that you return for lab work: TODAY: BMET. Mag, BNP If you have labs (blood work) drawn today and your tests are completely normal, you will receive your results only by: Marland Kitchen MyChart Message (if you have MyChart) OR . A paper copy in the mail If you have any lab test that is abnormal or we need to change your treatment, we will call you to review the results.   Testing/Procedures: None   Follow-Up: At [facility], you and your health needs are our priority.  As part of our continuing mission to provide you with exceptional heart care, we have created designated Provider Care Teams.  These Care Teams include your primary Cardiologist (physician) and Advanced Practice Providers (APPs -  Physician Assistants and Nurse Practitioners) who all work together to provide you with the care you need, when you need it.  We recommend signing up for the patient portal called "MyChart".  Sign up information is provided on this After Visit Summary.  MyChart is used to connect with patients for Virtual Visits (Telemedicine).  Patients are able to view lab/test results, encounter notes, upcoming appointments, etc.  Non-urgent messages can be sent to your provider as well.   To learn more about what you can do with MyChart, go to [email or web address].    Your next appointment:   3 month(s)  The format for your next appointment:   In Person  Provider:   Thomasene Ripple, DO   Other Instructions

## 2020-07-27 NOTE — Progress Notes (Signed)
Cardiology Office Note:    Date:  07/27/2020   ID:  Kaitlyn Rodriguez, DOB [birth year], MRN [id number]  PCP:  Elisabeth Most, FNP  Cardiologist:  None  Electrophysiologist:  None   Referring MD: Elisabeth Most, FNP   I have been worried about my health  History of Present Illness:    Kaitlyn Rodriguez is a 87 y.o. female with a hx of of repair type a aortic dissection in 2020, A. Fib, hypothyroidism is here today for establish cardiac care.  Since her emergent surgery in 2020 for her type a aortic dissection the patient had follow-up with cardiothoracic surgery and had been advised multiple times to see cardiology she tells me but she has not been able to to make this appointment.  I saw the patient for the first time in June 09, 2020 at that time she was in atrial fibrillation with rapid ventricular rate I increase her Toprol-XL to 50 mg in the morning and 25 mg at night.  We continued her on her Eliquis. During her visit I also spoke with the patient with rhythm control strategy but she declined as she said she had bad experience with amiodarone and did not want to take any other medications.   She was hypertensive.  But since increasing her beta-blocker we opted to wait and see what response she would get. An echocardiogram was ordered.  In the interim the patient was able to get her echocardiogram results.  She is here today for follow-up visit.  She is here today with her son.  She had questions about her echocardiogram.  Past Medical History:  Diagnosis Date  . Anxiety disorder, unspecified   . Benign essential hypertension   . Dissection of unspecified site of aorta (HCC) 11/2018  . Heart failure, unspecified (HCC)   . History of aortic valve replacement   . Hypothyroidism, unspecified   . Unspecified osteoarthritis, unspecified site   . Vitamin B12 deficiency anemia     Past Surgical History:  Procedure Laterality Date  . ABDOMINAL HYSTERECTOMY     at age 18  .  REPLACEMENT ASCENDING AORTA N/A 12/20/2018   Procedure: REPAIR OF TYPE A AORTIC DISSECTION USING HEMASHIELD PLATINUM AND GRAFT;  Surgeon: Linden Dolin, MD;  Location: [facility] OR;  Service: Open Heart Surgery;  Laterality: N/A;  . STERNAL CLOSURE  12/20/2018   Procedure: STERNAL PLATING;  Surgeon: Linden Dolin, MD;  Location: [facility] OR;  Service: Open Heart Surgery;;    Current Medications: Current Meds  Medication Sig  . apixaban (ELIQUIS) 5 MG TABS tablet Take 1 tablet (5 mg total) by mouth 2 (two) times daily.  . cyanocobalamin (,VITAMIN B-12,) 1000 MCG/ML injection Inject 1,000 mcg into the muscle every 30 (thirty) days.  . ferrous sulfate 325 (65 FE) MG tablet Take 325 mg by mouth daily with breakfast.  . furosemide (LASIX) 40 MG tablet Take 40 mg by mouth daily.  . Multiple Vitamin (MULTIVITAMIN WITH MINERALS) TABS tablet Take 1 tablet by mouth daily.  Marland Kitchen SYNTHROID 125 MCG tablet Take 125 mcg by mouth every morning.  . [DISCONTINUED] metoprolol succinate (TOPROL-XL) 50 MG 24 hr tablet Take 50 mg by mouth daily. Take with or immediately following a meal.     Allergies:   Patient has no known allergies.   Social History   Socioeconomic History  . Marital status: Widowed    Spouse name: Not on file  . Number of children: Not on file  .  Years of education: Not on file  . Highest education level: Not on file  Occupational History  . Not on file  Tobacco Use  . Smoking status: Never Smoker  . Smokeless tobacco: Never Used  Substance and Sexual Activity  . Alcohol use: Yes  . Drug use: Never  . Sexual activity: Not on file  Other Topics Concern  . Not on file  Social History Narrative  . Not on file   Social Determinants of Health   Financial Resource Strain: Not on file  Food Insecurity: Not on file  Transportation Needs: Not on file  Physical Activity: Not on file  Stress: Not on file  Social Connections: Not on file     Family History: The patient's  family history is not on file.  ROS:   Review of Systems  Constitution: Negative for decreased appetite, fever and weight gain.  HENT: Negative for congestion, ear discharge, hoarse voice and sore throat.   Eyes: Negative for discharge, redness, vision loss in right eye and visual halos.  Cardiovascular: Negative for chest pain, dyspnea on exertion, leg swelling, orthopnea and palpitations.  Respiratory: Negative for cough, hemoptysis, shortness of breath and snoring.   Endocrine: Negative for heat intolerance and polyphagia.  Hematologic/Lymphatic: Negative for bleeding problem. Does not bruise/bleed easily.  Skin: Negative for flushing, nail changes, rash and suspicious lesions.  Musculoskeletal: Negative for arthritis, joint pain, muscle cramps, myalgias, neck pain and stiffness.  Gastrointestinal: Negative for abdominal pain, bowel incontinence, diarrhea and excessive appetite.  Genitourinary: Negative for decreased libido, genital sores and incomplete emptying.  Neurological: Negative for brief paralysis, focal weakness, headaches and loss of balance.  Psychiatric/Behavioral: Negative for altered mental status, depression and suicidal ideas.  Allergic/Immunologic: Negative for HIV exposure and persistent infections.    EKGs/Labs/Other Studies Reviewed:    The following studies were reviewed today:   EKG: None today  TTE 07/12/2020 IMPRESSIONS  1. Left ventricular ejection fraction, by estimation, is 60 to 65%. The left ventricle has normal function. The left ventricle has no regional wall motion abnormalities. There is mild left ventricular hypertrophy.  Left ventricular diastolic parameters  are indeterminate. There is Hypokinetic basal septum.  2. Right ventricular systolic function is normal. The right ventricular size is normal. There is severely elevated pulmonary artery systolic pressure.  3. Left atrial size was severely dilated.  4. Right atrial size was moderately  dilated.  5. The mitral valve is normal in structure. Mild to moderate mitral valve regurgitation. No evidence of mitral stenosis.  6. Tricuspid valve regurgitation is moderate.  7. The aortic valve is normal in structure. Aortic valve regurgitation is mild. Mild aortic valve sclerosis is present, with no evidence of aortic valve stenosis.  8. Descending aorta measured at 3.1cm.. There is mild dilatation of the  ascending aorta, measuring 39 mm.  9. The inferior vena cava is normal in size with greater than 50% respiratory variability, suggesting right atrial pressure of 3 mmHg.   FINDINGS  Left Ventricle: Left ventricular ejection fraction, by estimation, is 60  to 65%. The left ventricle has normal function. The left ventricle has no  regional wall motion abnormalities. The left ventricular internal cavity  size was normal in size. There is  mild left ventricular hypertrophy. Hypokinetic basal septum. Left  ventricular diastolic parameters are indeterminate.   Right Ventricle: The right ventricular size is normal. No increase in  right ventricular wall thickness. Right ventricular systolic function is  normal. There is severely elevated pulmonary  artery systolic pressure. The  tricuspid regurgitant velocity is  3.66 m/s, and with an assumed right atrial pressure of 8 mmHg, the  estimated right ventricular systolic pressure is 61.6 mmHg.   Left Atrium: Left atrial size was severely dilated.   Right Atrium: Right atrial size was moderately dilated.   Pericardium: There is no evidence of pericardial effusion.   Mitral Valve: The mitral valve is normal in structure. Mild to moderate  mitral valve regurgitation. No evidence of mitral valve stenosis.   Tricuspid Valve: The tricuspid valve is normal in structure. Tricuspid  valve regurgitation is moderate . No evidence of tricuspid stenosis.   Aortic Valve: The aortic valve is normal in structure. Aortic valve  regurgitation is  mild. Mild aortic valve sclerosis is present, with no  evidence of aortic valve stenosis.   Pulmonic Valve: The pulmonic valve was normal in structure. Pulmonic valve  regurgitation is not visualized. No evidence of pulmonic stenosis.   Aorta: Descending aorta measured at 3.1cm. The aortic root is normal in  size and structure. There is mild dilatation of the ascending aorta,  measuring 39 mm.   Venous: The inferior vena cava is normal in size with greater than 50%  respiratory variability, suggesting right atrial pressure of 3 mmHg.   IAS/Shunts: No atrial level shunt detected by color flow Doppler.   Recent Labs: No results found for requested labs within last 8760 hours.  Recent Lipid Panel    Component Value Date/Time   CHOL 86 12/22/2018 0407   TRIG 58 12/22/2018 0407   HDL 22 (L) 12/22/2018 0407   CHOLHDL 3.9 12/22/2018 0407   VLDL 12 12/22/2018 0407   LDLCALC 52 12/22/2018 0407    Physical Exam:    VS:  BP 130/90   Pulse 90   Ht 5\' 7"  (1.702 m)   Wt 206 lb 3.2 oz (93.5 kg)   SpO2 96%   BMI 32.30 kg/m     Wt Readings from Last 3 Encounters:  07/27/20 206 lb 3.2 oz (93.5 kg)  06/09/20 201 lb (91.2 kg)  02/16/19 212 lb (96.2 kg)     GEN: Well nourished, well developed in no acute distress HEENT: Normal NECK: No JVD; No carotid bruits LYMPHATICS: No lymphadenopathy CARDIAC: S1S2 noted,RRR, no murmurs, rubs, gallops RESPIRATORY:  Clear to auscultation without rales, wheezing or rhonchi  ABDOMEN: Soft, non-tender, non-distended, +bowel sounds, no guarding. EXTREMITIES: No edema, No cyanosis, no clubbing MUSCULOSKELETAL:  No deformity  SKIN: Warm and dry NEUROLOGIC:  Alert and oriented x 3, non-focal PSYCHIATRIC:  Normal affect, good insight  ASSESSMENT:    1. Persistent atrial fibrillation (HCC)   2. Nonrheumatic mitral valve regurgitation   3. Nonrheumatic aortic valve insufficiency   4. S/P aortic aneurysm repair   5. Dissecting aneurysm of thoracic  aorta, Stanford type A (HCC)   6. Pulmonary hypertension (HCC)   7. Obesity (BMI 30-39.9)    PLAN:    Today we talked about her echocardiogram I discussed her results with her patient.  I explained to her that she does have severely dilated left atrium and explained to the patient what this means and endograsper atrial fibrillation.  Explain her mild to moderate mitral regurgitation which I suspect may secondary to her atrial fibrillation.  There is mild aortic regurgitation and moderate tricuspid regurgitation.  There is severe pulmonary artery systolic pressure with severe pulmonary hypertension.  She appears volume overloaded and for now we will continue the patient on diuretics and see  how she responds.  If she does not have any significant improvement I plan to refer her to our heart failure colleagues for treatment of her severe pulmonary hypertension.  In the meantime we talked about rhythm control for atrial fibrillation for now she would rather hold off as she did have a bad experience with amiodarone and is not really keen on undergoing a cardioversion at this time.  Which I am not sure if rhythm control would even help this patient given her significantly dilated left atrium.  So we will continue with rate control strategy with her metoprolol succinate which now I am going to increase to 50 mg twice a day.  She will remain on the Eliquis 5 mg twice a day.  Her abdominal aorta was dilated we plan to reassess this with imaging in 3 months.  She has limited and does not exercise as much.  Get blood work today for [employer] Corporation[employer]MP, Mag and BNP.  The patient is in agreement with the above plan. The patient left the office in stable condition.  The patient will follow up in 3 months or sooner if needed.   Medication Adjustments/Labs and Tests Ordered: Current medicines are reviewed at length with the patient today.  Concerns regarding medicines are outlined above.  Orders Placed This Encounter   Procedures  . Basic metabolic panel  . Magnesium  . Pro b natriuretic peptide (BNP)   Meds ordered this encounter  Medications  . metoprolol succinate (TOPROL-XL) 50 MG 24 hr tablet    Sig: Take 1 tablet (50 mg total) by mouth daily. Take with or immediately following a meal.    Dispense:  180 tablet    Refill:  3    Patient Instructions  Medication Instructions:  Your physician recommends that you continue on your current medications as directed. Please refer to the Current Medication list given to you today.   *If you need a refill on your cardiac medications before your next appointment, please call your pharmacy*   Lab Work: Your physician recommends that you return for lab work: TODAY: BMET. Mag, BNP If you have labs (blood work) drawn today and your tests are completely normal, you will receive your results only by: Marland Kitchen. MyChart Message (if you have MyChart) OR . A paper copy in the mail If you have any lab test that is abnormal or we need to change your treatment, we will call you to review the results.   Testing/Procedures: None   Follow-Up: At [facility], you and your health needs are our priority.  As part of our continuing mission to provide you with exceptional heart care, we have created designated Provider Care Teams.  These Care Teams include your primary Cardiologist (physician) and Advanced Practice Providers (APPs -  Physician Assistants and Nurse Practitioners) who all work together to provide you with the care you need, when you need it.  We recommend signing up for the patient portal called "MyChart".  Sign up information is provided on this After Visit Summary.  MyChart is used to connect with patients for Virtual Visits (Telemedicine).  Patients are able to view lab/test results, encounter notes, upcoming appointments, etc.  Non-urgent messages can be sent to your provider as well.   To learn more about what you can do with MyChart, go to  [email or web address].    Your next appointment:   3 month(s)  The format for your next appointment:   In Person  Provider:   Thomasene RippleKardie Keyaan Lederman, DO  Other Instructions      Adopting a Healthy Lifestyle.  Know what a healthy weight is for you (roughly BMI <25) and aim to maintain this   Aim for 7+ servings of fruits and vegetables daily   65-80+ fluid ounces of water or unsweet tea for healthy kidneys   Limit to max 1 drink of alcohol per day; avoid smoking/tobacco   Limit animal fats in diet for cholesterol and heart health - choose grass fed whenever available   Avoid highly processed foods, and foods high in saturated/trans fats   Aim for low stress - take time to unwind and care for your mental health   Aim for 150 min of moderate intensity exercise weekly for heart health, and weights twice weekly for bone health   Aim for 7-9 hours of sleep daily   When it comes to diets, agreement about the perfect plan isnt easy to find, even among the experts. Experts at the [facility] [employer] developed an idea known as the Healthy Eating Plate. Just imagine a plate divided into logical, healthy portions.   The emphasis is on diet quality:   Load up on vegetables and fruits - one-half of your plate: Aim for color and variety, and remember that potatoes dont count.   Go for whole grains - one-quarter of your plate: Whole wheat, barley, wheat berries, quinoa, oats, brown rice, and foods made with them. If you want pasta, go with whole wheat pasta.   Protein power - one-quarter of your plate: Fish, chicken, beans, and nuts are all healthy, versatile protein sources. Limit red meat.   The diet, however, does go beyond the plate, offering a few other suggestions.   Use healthy plant oils, such as olive, canola, soy, corn, sunflower and peanut. Check the labels, and avoid partially hydrogenated oil, which have unhealthy trans fats.   If youre thirsty, drink water.  Coffee and tea are good in moderation, but skip sugary drinks and limit milk and dairy products to one or two daily servings.   The type of carbohydrate in the diet is more important than the amount. Some sources of carbohydrates, such as vegetables, fruits, whole grains, and beans-are healthier than others.   Finally, stay active  Signed, Thomasene Ripple, DO  07/27/2020 4:53 PM    [facility]

## 2020-07-28 ENCOUNTER — Telehealth: Payer: Self-pay | Admitting: Cardiology

## 2020-07-28 LAB — BASIC METABOLIC PANEL
BUN/Creatinine Ratio: 26 (ref 12–28)
BUN: 17 mg/dL (ref 8–27)
CO2: 24 mmol/L (ref 20–29)
Calcium: 9.3 mg/dL (ref 8.7–10.3)
Chloride: 95 mmol/L — ABNORMAL LOW (ref 96–106)
Creatinine, Ser: 0.65 mg/dL (ref 0.57–1.00)
Glucose: 101 mg/dL — ABNORMAL HIGH (ref 65–99)
Potassium: 4.8 mmol/L (ref 3.5–5.2)
Sodium: 133 mmol/L — ABNORMAL LOW (ref 134–144)
eGFR: 86 mL/min/{1.73_m2} (ref >59)

## 2020-07-28 LAB — PRO B NATRIURETIC PEPTIDE: NT-Pro BNP: 1897 pg/mL — ABNORMAL HIGH (ref 0–738)

## 2020-07-28 LAB — MAGNESIUM: Magnesium: 1.9 mg/dL (ref 1.6–2.3)

## 2020-07-28 MED ORDER — FUROSEMIDE 40 MG PO TABS: 40.0000 mg | ORAL_TABLET | Freq: Every day | ORAL | 3 refills | Status: DC

## 2020-07-28 NOTE — Telephone Encounter (Signed)
Refill sent in per request.  

## 2020-07-28 NOTE — Telephone Encounter (Signed)
*  STAT* If patient is at the pharmacy, call can be transferred to refill team.   1. Which medications need to be refilled? (please list name of each medication and dose if known) furosemide (LASIX) 40 MG tablet  2. Which pharmacy/location (including street and city if local pharmacy) is medication to be sent to? [facility] - [place], [place] - [address]  3. Do they need a 30 day or 90 day supply? 90 day

## 2020-08-01 ENCOUNTER — Other Ambulatory Visit: Payer: Self-pay

## 2020-08-01 ENCOUNTER — Other Ambulatory Visit: Payer: Self-pay | Admitting: Podiatry

## 2020-08-01 ENCOUNTER — Ambulatory Visit (INDEPENDENT_AMBULATORY_CARE_PROVIDER_SITE_OTHER): Payer: Medicare Other

## 2020-08-01 ENCOUNTER — Ambulatory Visit: Payer: Medicare Other | Admitting: Podiatry

## 2020-08-01 DIAGNOSIS — M79672 Pain in left foot: Secondary | ICD-10-CM

## 2020-08-01 DIAGNOSIS — Q666 Other congenital valgus deformities of feet: Secondary | ICD-10-CM

## 2020-08-01 DIAGNOSIS — M2142 Flat foot [pes planus] (acquired), left foot: Secondary | ICD-10-CM | POA: Diagnosis not present

## 2020-08-01 DIAGNOSIS — M79671 Pain in right foot: Secondary | ICD-10-CM

## 2020-08-01 DIAGNOSIS — M25572 Pain in left ankle and joints of left foot: Secondary | ICD-10-CM

## 2020-08-01 DIAGNOSIS — M25571 Pain in right ankle and joints of right foot: Secondary | ICD-10-CM

## 2020-08-01 DIAGNOSIS — M25371 Other instability, right ankle: Secondary | ICD-10-CM | POA: Diagnosis not present

## 2020-08-01 DIAGNOSIS — M2141 Flat foot [pes planus] (acquired), right foot: Secondary | ICD-10-CM

## 2020-08-01 DIAGNOSIS — M25372 Other instability, left ankle: Secondary | ICD-10-CM

## 2020-08-05 ENCOUNTER — Other Ambulatory Visit: Payer: Self-pay | Admitting: Podiatry

## 2020-08-05 DIAGNOSIS — M25372 Other instability, left ankle: Secondary | ICD-10-CM

## 2020-08-05 DIAGNOSIS — M25371 Other instability, right ankle: Secondary | ICD-10-CM

## 2020-08-11 ENCOUNTER — Telehealth: Payer: Self-pay | Admitting: Cardiology

## 2020-08-11 MED ORDER — METOPROLOL SUCCINATE ER 50 MG PO TB24: 50.0000 mg | ORAL_TABLET | Freq: Two times a day (BID) | ORAL | 3 refills | Status: DC

## 2020-08-11 NOTE — Telephone Encounter (Signed)
Pt c/o medication issue:  1. Name of Medication: metoprolol succinate (TOPROL-XL) 50 MG 24 hr tablet  2. How are you currently taking this medication (dosage and times per day)? Per Zella Ball, patient states she takes 1 tablet twice daily (100 MG daily)  3. Are you having a reaction (difficulty breathing--STAT)?   4. What is your medication issue?   Robin with [facility] is requesting clarification on the medication instructions. She states the patient told her she is to take 1 tablet twice daily, but her instructions state the patient is to take 1 tablet daily. Please return call to discuss at [phone].

## 2020-08-11 NOTE — Telephone Encounter (Signed)
Refill sent in with correct instructions.

## 2020-08-12 ENCOUNTER — Other Ambulatory Visit: Payer: Medicare Other

## 2020-08-14 NOTE — Progress Notes (Signed)
  Subjective:  Patient ID: Kaitlyn Rodriguez, female    DOB: March 19, 1933,  MRN: [id number]  No chief complaint on file.   87 y.o. female presents with the above complaint. History confirmed with patient.  States she has had flatfeet for many years and has never done anything about them reports pain and swelling to both ankles and difficulty walking.  Has tried different shoes and orthotics with no help.  Reports swelling and occasional pain.  States that the right ankle turned over more than left  Objective:  Physical Exam: warm, good capillary refill, no trophic changes or ulcerative lesions, normal DP and PT pulses, and normal sensory exam. Left Foot: Mild pes planus Right Foot: Severe pes planus with forefoot abductus and to many toes sign  No images are attached to the encounter.  Radiographs: X-ray of both feet: Pes planus noted bilaterally with severe hindfoot degenerative changes Assessment:   1. Pes planus of both feet   2. Pain in both feet   3. Congenital hindfoot valgus      Plan:  Patient was evaluated and treated and all questions answered.  Pes Planus -X-rays reviewed as above -Educated on etiology -I think she would benefit from bracing.  We discussed that other options are limited at this time as she is not a candidate for such extensive surgery and she is not interested in surgery anyway.  We will make appointment for her wear a brace fabrication and follow-up thereafter  No follow-ups on file.

## 2020-09-02 ENCOUNTER — Other Ambulatory Visit: Payer: Medicare Other

## 2020-09-14 ENCOUNTER — Telehealth: Payer: Self-pay | Admitting: Cardiology

## 2020-09-14 NOTE — Telephone Encounter (Signed)
Spoke to the patient just now and let her know that we do not prescribe anxiety medications as a cardiology office. I advised that she should call her PCP in regards to this.  She verbalizes understanding.    Encouraged patient to call back with any questions or concerns.

## 2020-09-14 NOTE — Telephone Encounter (Signed)
     Pt c/o medication issue:  1. Name of Medication: anxiety meds  2. How are you currently taking this medication (dosage and times per day)?   3. Are you having a reaction (difficulty breathing--STAT)?   4. What is your medication issue? Pt said she's been feeling anxious, especially when her son left she felt alone. She wanted to ask Dr. Servando Salina what anxiety pill she can prescribe to her that will not make her feel sick. She said to call her back later afternoon

## 2020-09-16 ENCOUNTER — Telehealth: Payer: Self-pay | Admitting: Cardiology

## 2020-09-16 NOTE — Telephone Encounter (Signed)
Attempted to call provided number twice, received a busy dial tone both times.

## 2020-09-16 NOTE — Telephone Encounter (Signed)
Pt wants to know if she can eat turnip greens and barbeque once a week. She knows that Dr. Servando Salina wanted her to watch what she ate but she wants to know if this will be okay. Please advise.

## 2020-09-20 NOTE — Telephone Encounter (Signed)
Called patient and let her know she can have bbq and greens once a week as long as it's not a lot. She states she has been having cramps and once she ate some greens the cramps went away. She asked about ham, I let her know if she is going to eat ham she can bbq. She verbalized understanding.

## 2020-09-27 ENCOUNTER — Telehealth: Payer: Self-pay | Admitting: Cardiology

## 2020-09-27 NOTE — Telephone Encounter (Signed)
Called patient, she states her Synthroid medication was changed to 125 mcg once daily. Metoprolol was changed to twice daily. "It seems I have been anxious since it has been changed." Patient denies any other symptoms "I just want to know does this medication cause anxiety" They changed my Ferrous Sulfate to every other day instead of daily. Patient is worried. After speaking with her for an extended period of time she states she has Xanax and Lexapro prescribed to her. She takes 1/2 a Xanax at night and 1/2 in the morning. She does not take the Lexapro at all. Patient encouraged to reach out to her PCP in regards of taking both medications together since they were prescribed like that. "I am scared to take them together" Patient states "I do not have a primary doctor they do not stay there long" Patient encouraged to look for another PCP to have her anxiety addressed since her PCP originally prescribed them. Her PCP's office is getting a new provider in October. She thanked me for calling her.

## 2020-09-27 NOTE — Telephone Encounter (Signed)
Pt c/o medication issue:  1. Name of Medication:  metoprolol succinate (TOPROL-XL) 50 MG 24 hr tablet  2. How are you currently taking this medication (dosage and times per day)? Started taking a 1/2 tablet daily as of a week ago   3. Are you having a reaction (difficulty breathing--STAT)? Yes  4. What is your medication issue? States she thinks it was causing anxiety. BP not being managed well due to this and still having anxiety. Wants to know if Dr. Servando Salina thinks this medication is causing anxiety.

## 2020-10-11 ENCOUNTER — Encounter: Payer: Self-pay | Admitting: Cardiology

## 2020-10-11 NOTE — Telephone Encounter (Signed)
Error

## 2020-10-24 IMAGING — DX DG CHEST 2V
2 series · 2 of 2 positions shown · non-contrast
Comparison: 12/24/2018

CLINICAL DATA: Pleural effusion short of breath. Replacement
ascending aorta 12/20/2018

EXAM:
CHEST - 2 VIEW

[chest lat]
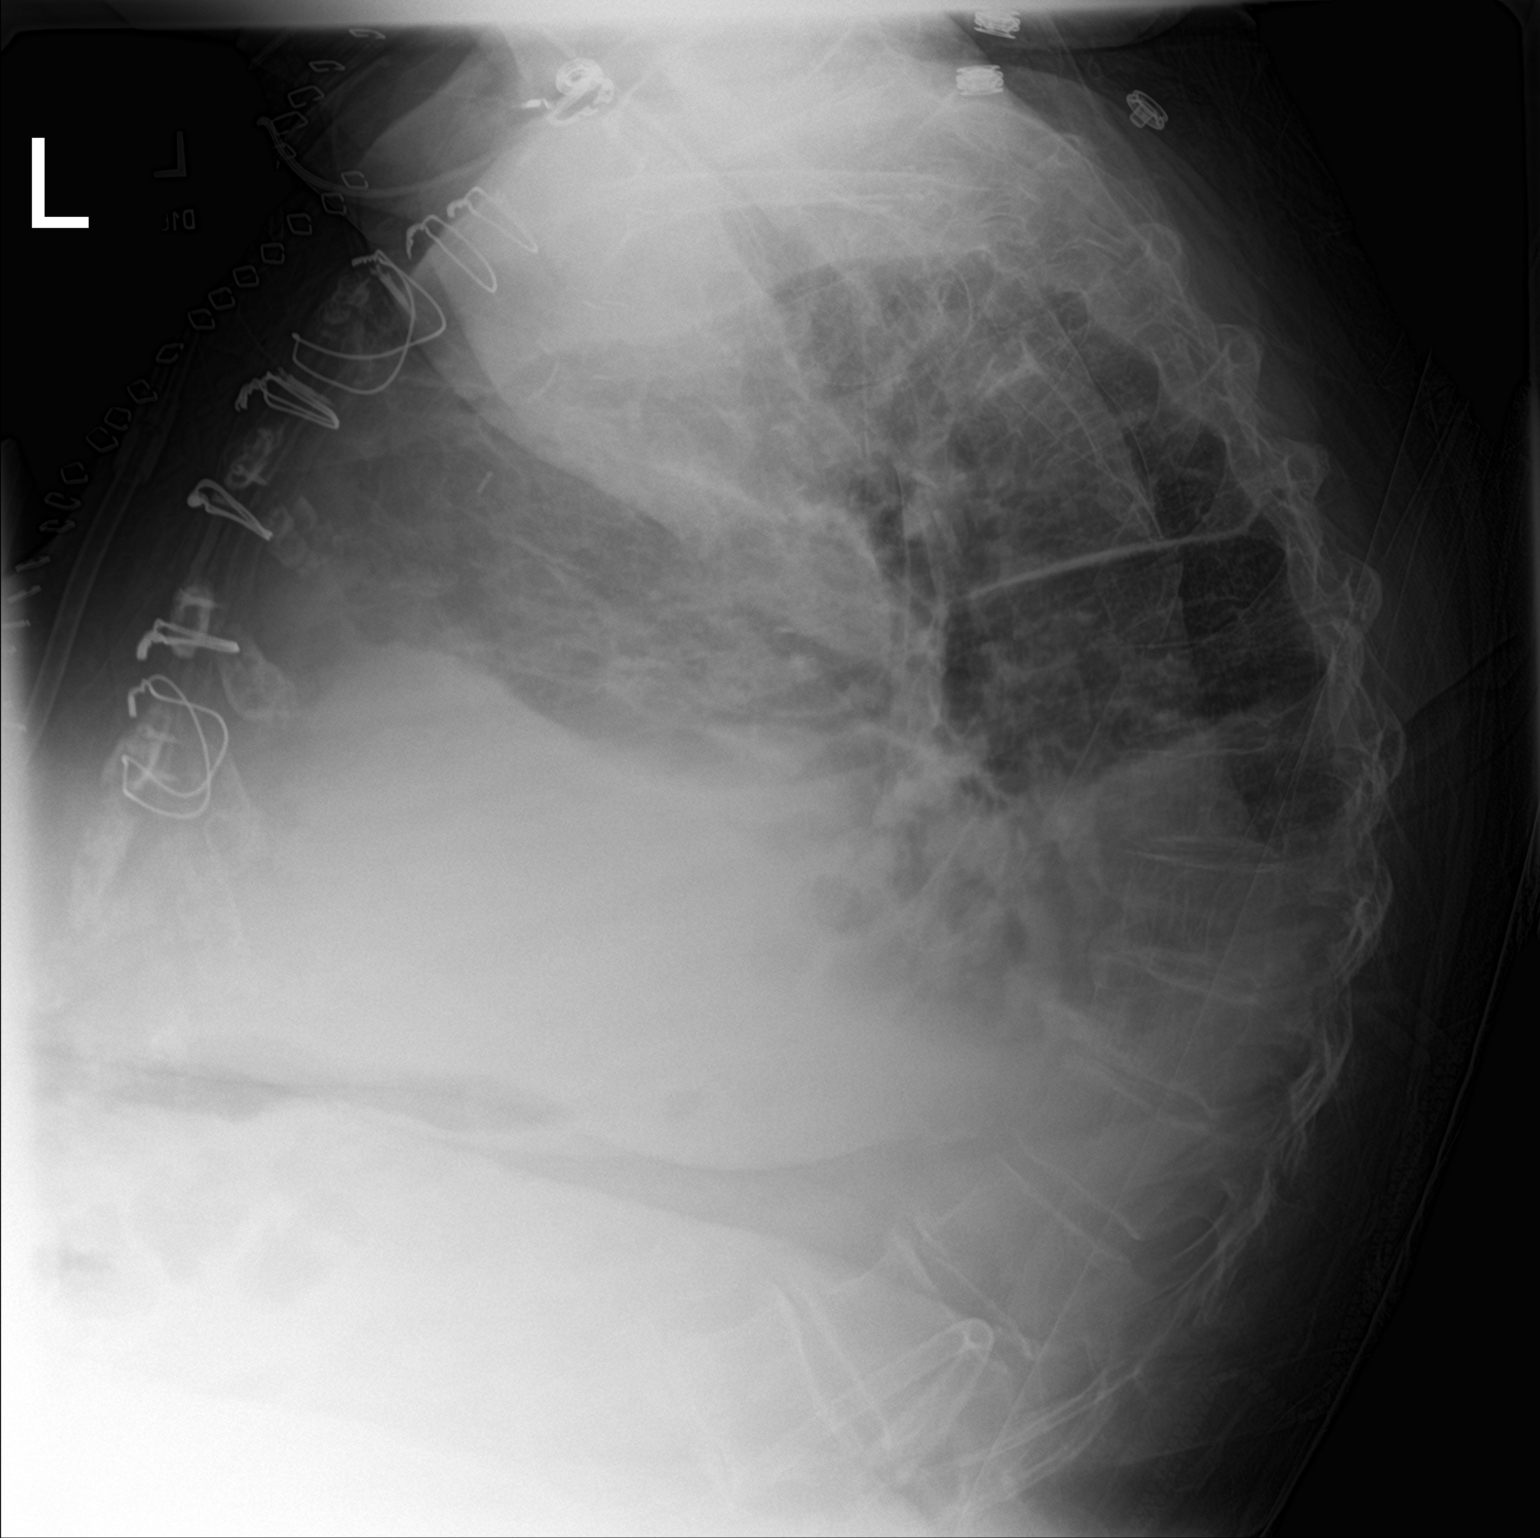

[chest ap]
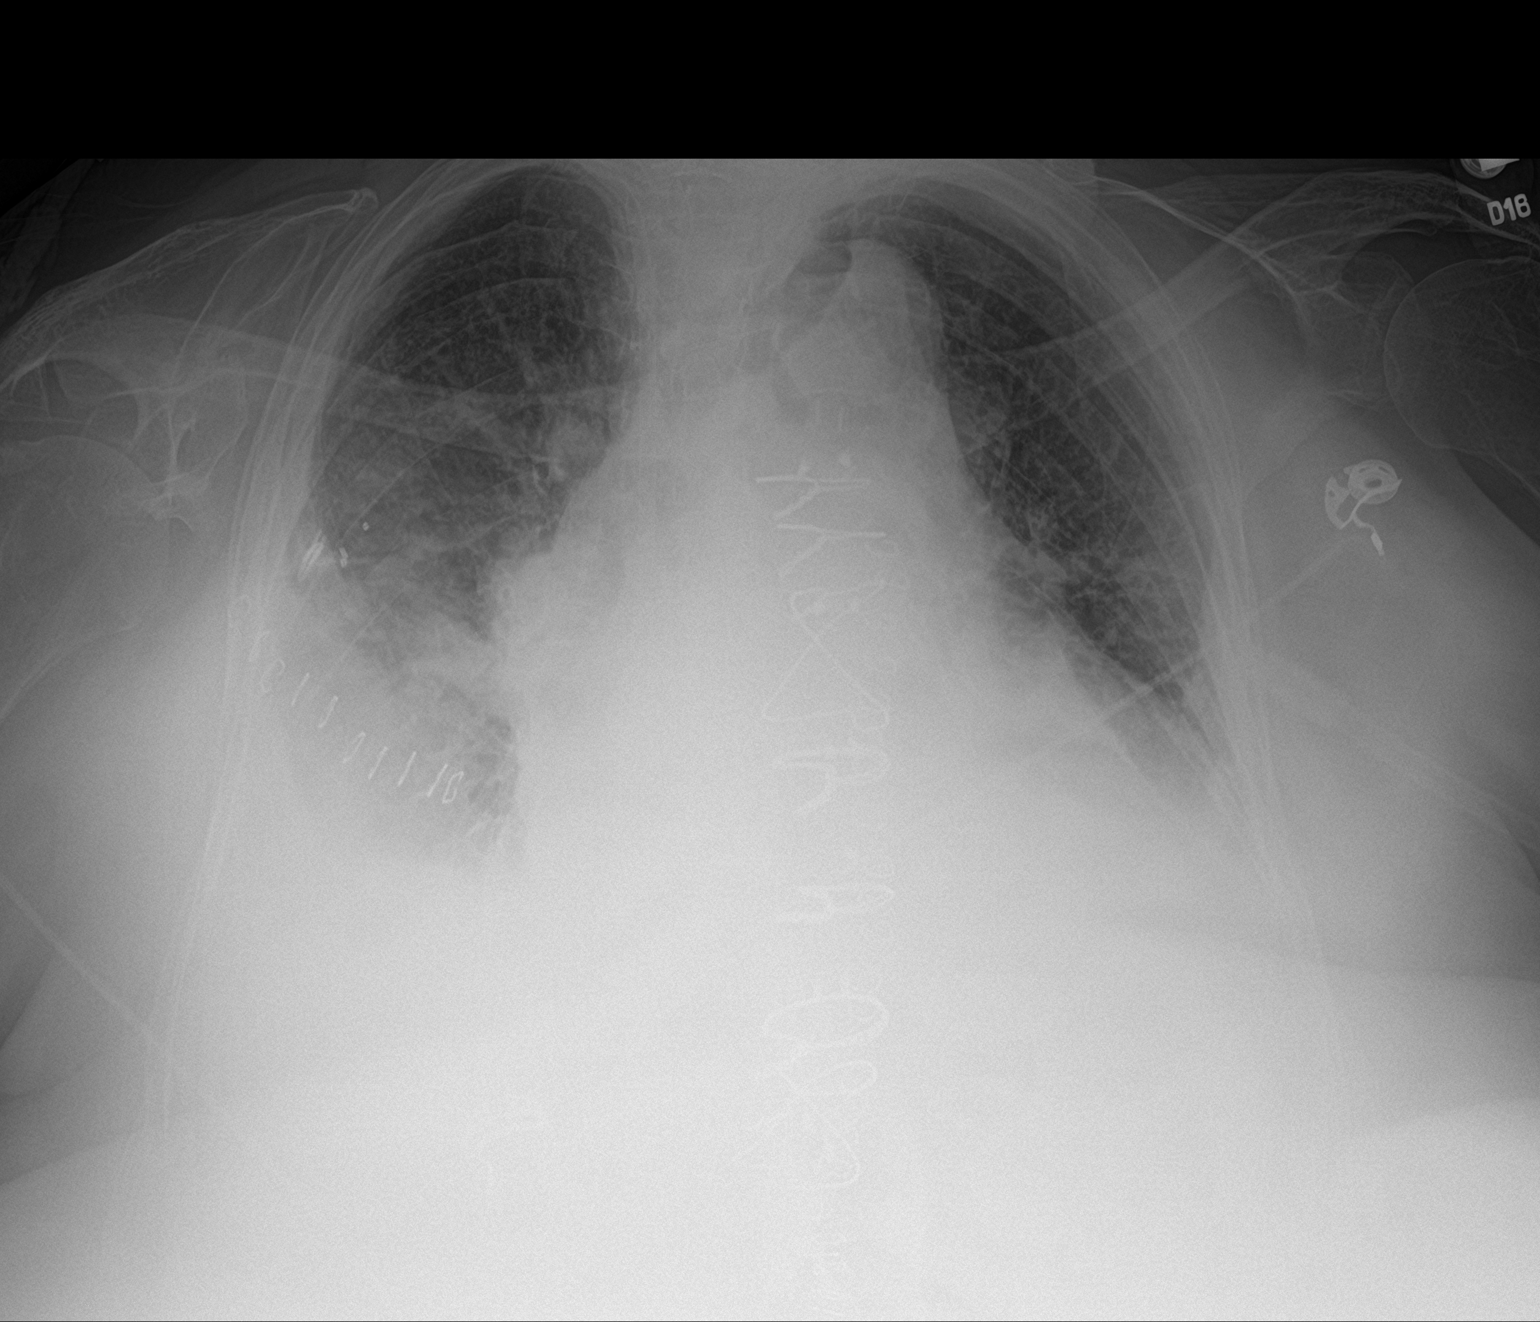

[2 of 2 positions shown; findings below may reference images not displayed]

FINDINGS: Progression of right pleural effusion progression of right lower
lobe consolidation. Small left effusion unchanged. Negative for
pneumothorax.

Cardiac enlargement with vascular congestion unchanged.
IMPRESSION: Progression of right pleural effusion and right lower lobe
consolidation. No pneumothorax.

## 2020-10-27 IMAGING — DX DG CHEST 1V PORT
1 series · 1 of 1 positions shown · non-contrast
Comparison: Chest x-rays dated 12/26/2018 and 12/25/2018. Chest CT
dated 12/26/2018.

CLINICAL DATA: Pleural effusion

EXAM:
PORTABLE CHEST 1 VIEW

[chest ap]
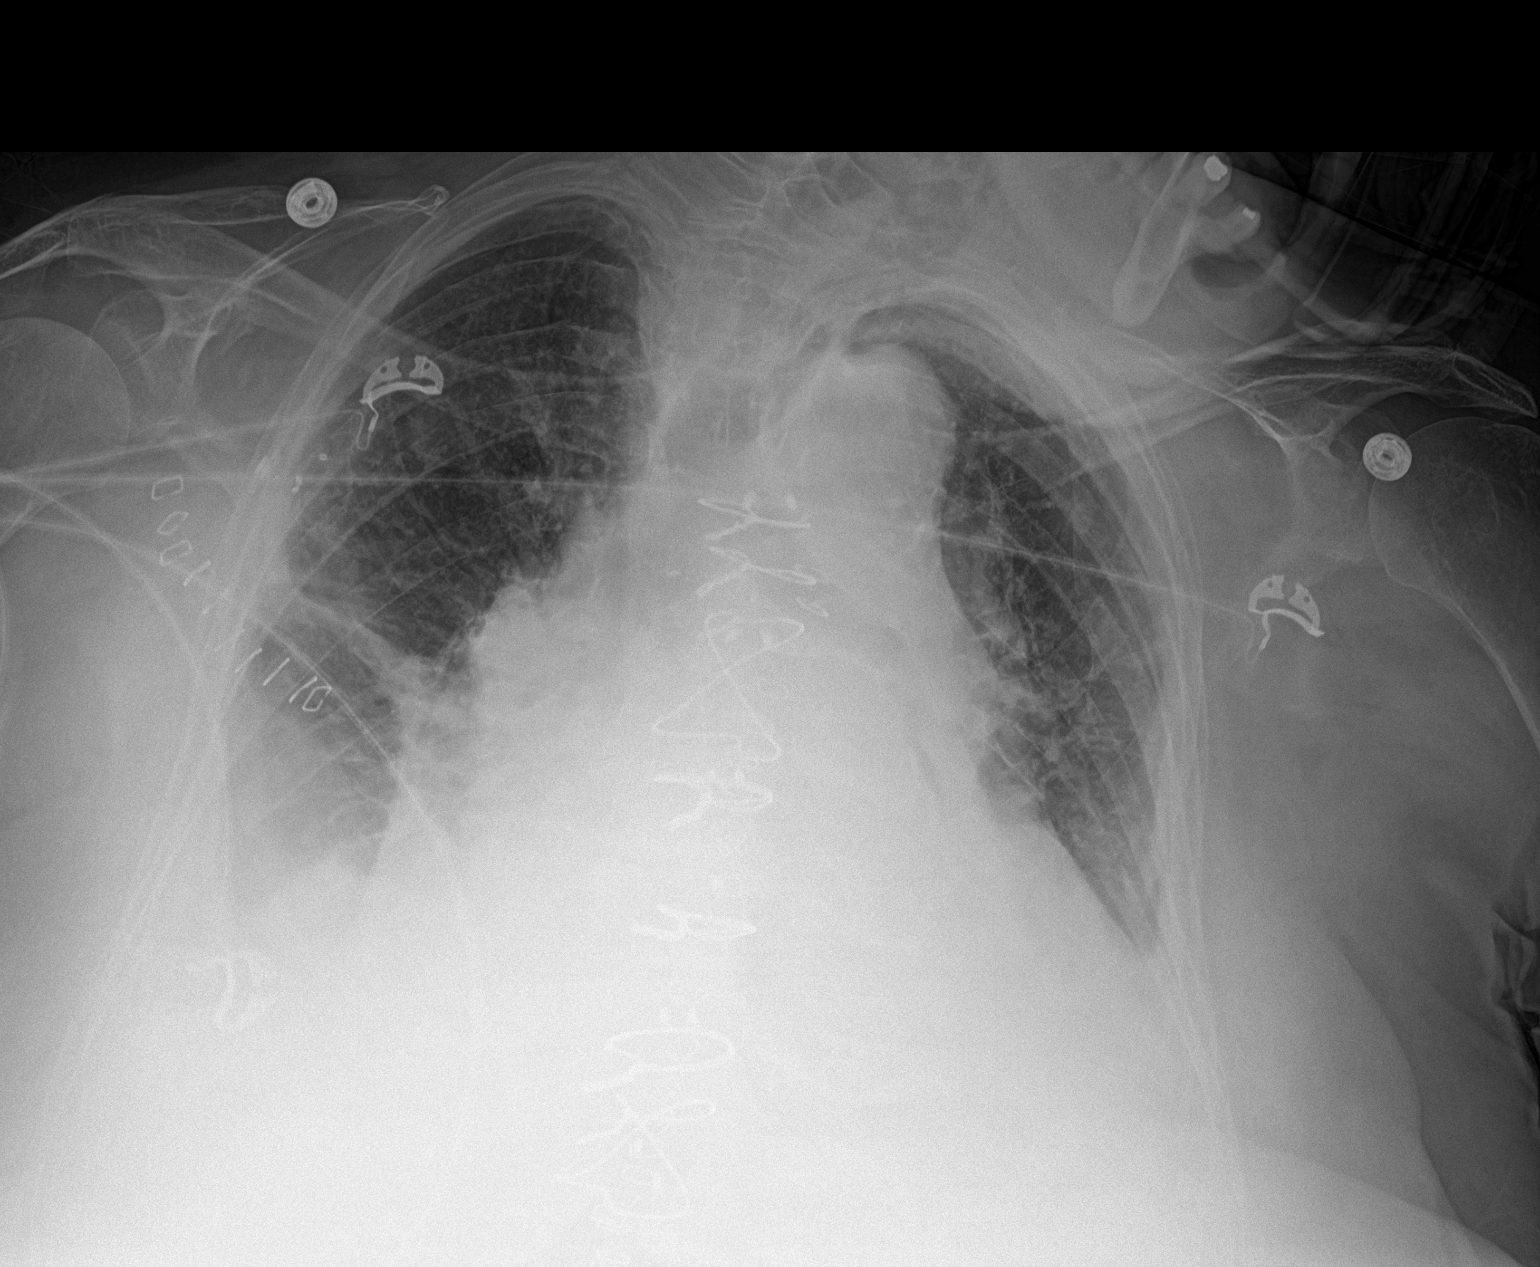

[1 of 1 positions shown; findings below may reference images not displayed]

FINDINGS: Stable cardiomegaly. Stable bibasilar opacities, compatible with the
combination of atelectasis and small pleural effusions demonstrated
on chest CT of 12/26/2018. No new lung findings. No pneumothorax is
seen. Median sternotomy wires appear intact and stable in alignment.
IMPRESSION: Stable chest x-ray. Stable bibasilar opacities, compatible with the
combination of atelectasis and small pleural effusions demonstrated
on chest CT of [DATE].

## 2020-10-27 IMAGING — CT CT HEAD W/O CM
4 series · 16 of 47 positions shown, 18 images · non-contrast
Comparison: None.

CLINICAL DATA: Ataxia and altered mental status following repair of
aortic dissection. Stroke suspected.

EXAM:
CT HEAD WITHOUT CONTRAST
TECHNIQUE: Contiguous axial images were obtained from the base of the skull
through the vertex without intravenous contrast.

[Series 3: head without · axial · non-contrast · 0.40mm/px · z∈[-125,-5]mm · 7 of 32 slices shown, 9 images]
[im 4/32  brain]
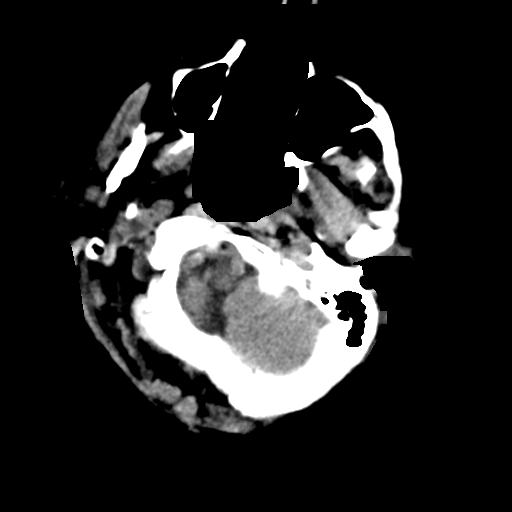
[im 4/32  bone]
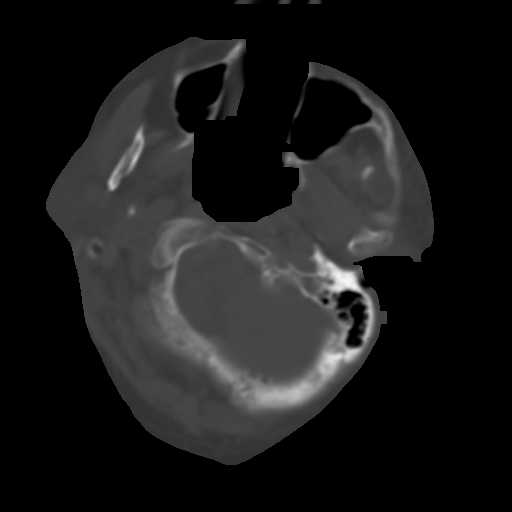
[im 8/32  brain]
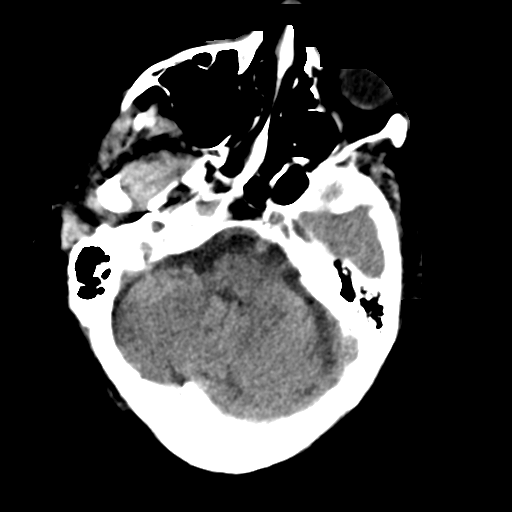
[im 12/32  brain]
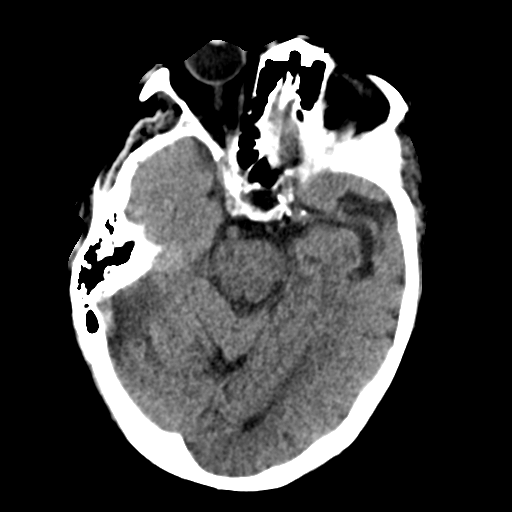
[im 16/32  brain]
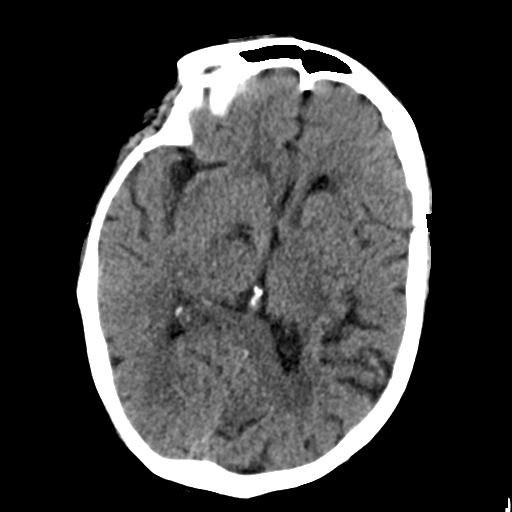
[im 20/32  brain]
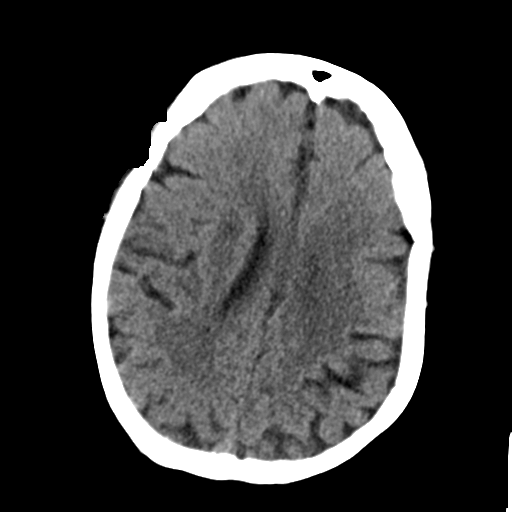
[im 20/32  bone]
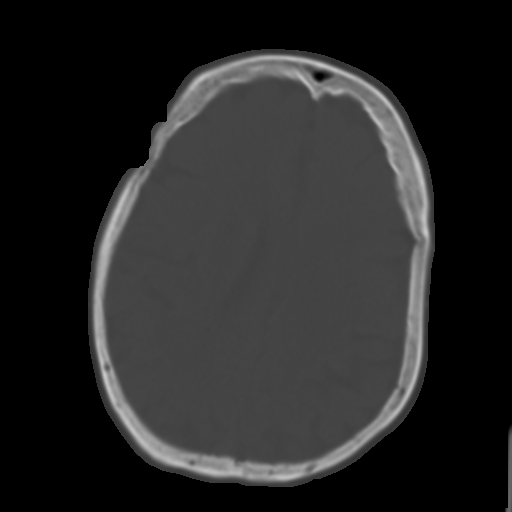
[im 24/32  brain]
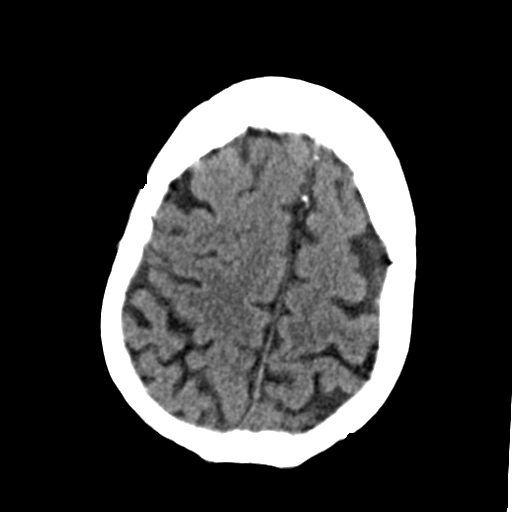
[im 28/32  brain]
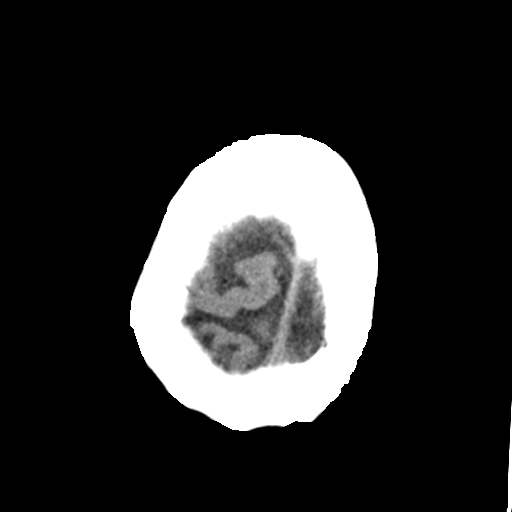

[Series 4: head bone · axial · 0.40mm/px · z∈[-126,-94]mm · 3 of 80 slices shown]
[im 8/80  bone]
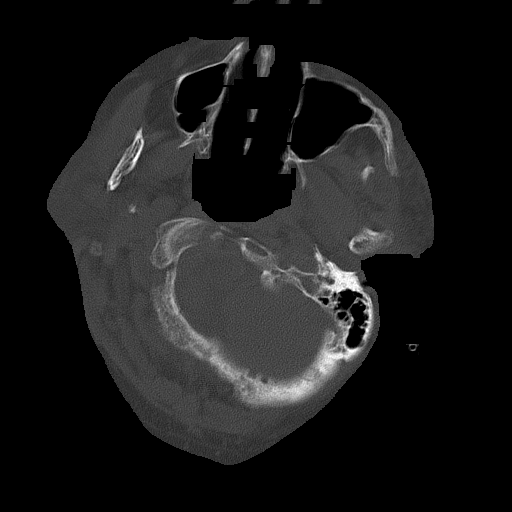
[im 16/80  bone]
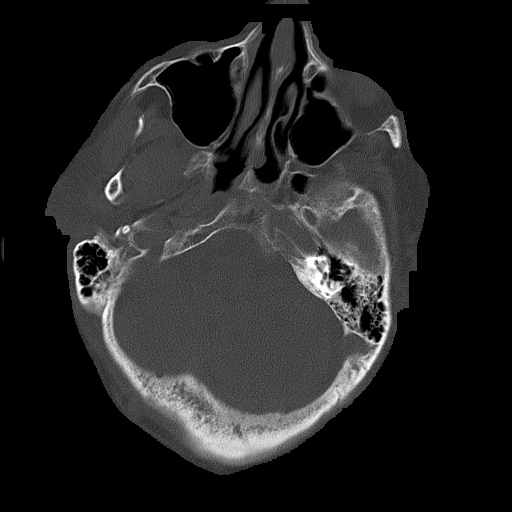
[im 24/80  bone]
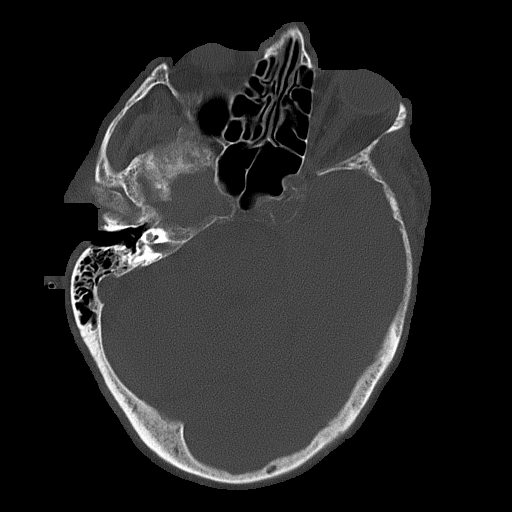

[Series 5: head without cor · coronal · non-contrast · 0.33mm/px · 3 of 67 slices shown]
[im 23/67  brain]
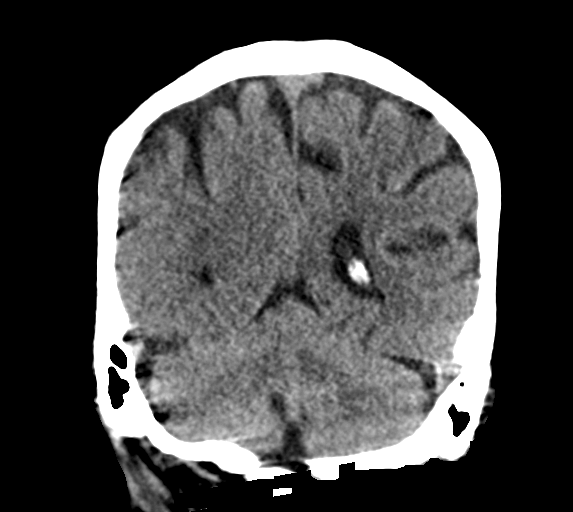
[im 30/67  brain]
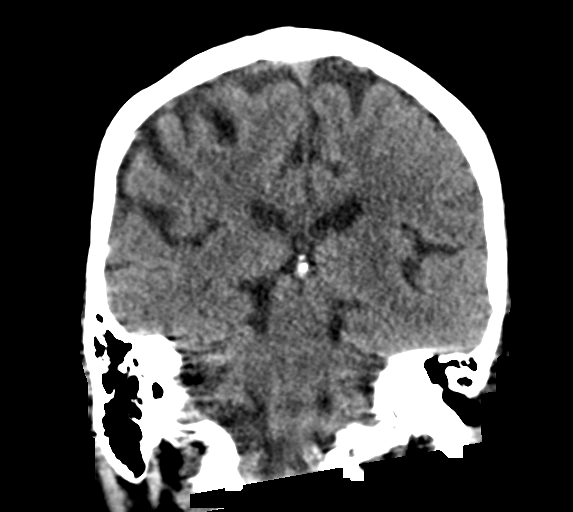
[im 37/67  brain]
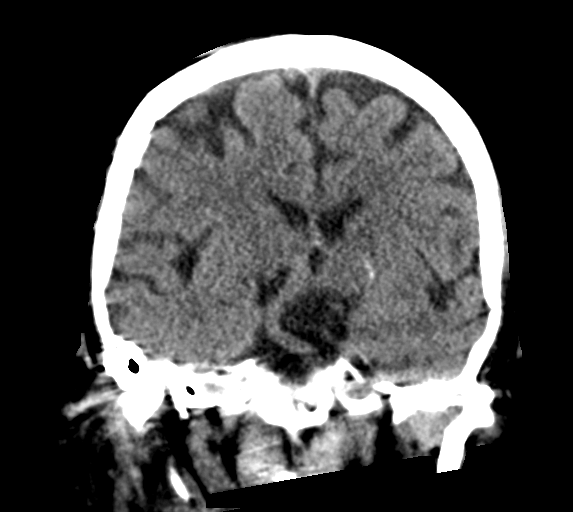

[Series 6: head without sag · sagittal · non-contrast · 0.31mm/px · 3 of 67 slices shown]
[im 29/67  brain]
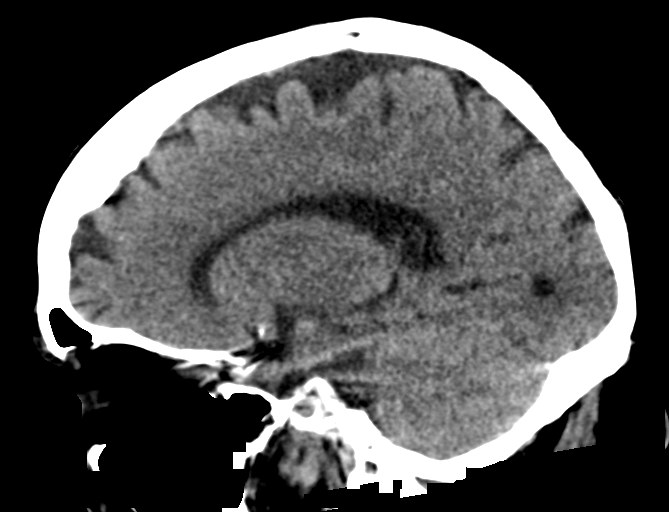
[im 34/67  brain]
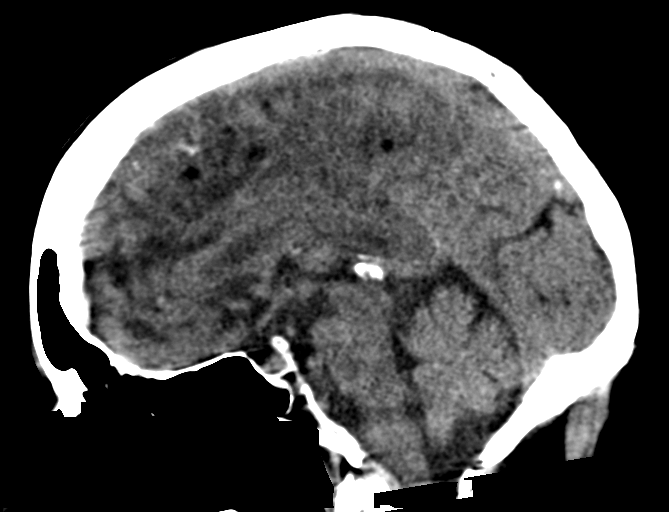
[im 38/67  brain]
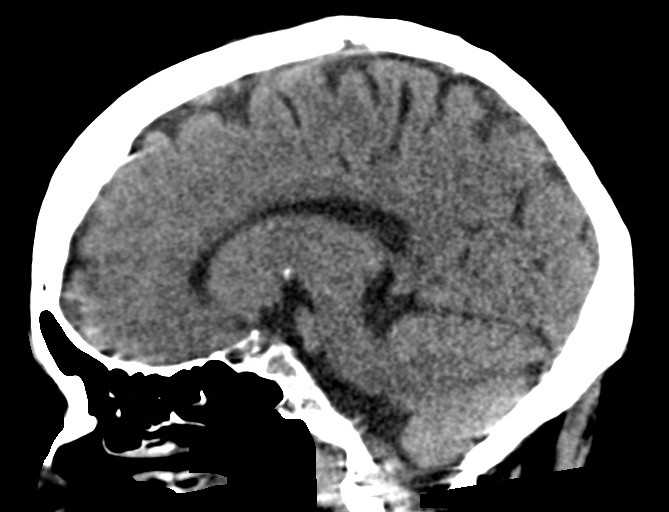

[16 of 47 positions shown; findings below may reference images not displayed]

FINDINGS: Brain: Moderate generalized atrophy and white matter disease is
present bilaterally. There is some asymmetry white matter disease on
the left. No acute or focal cortical abnormality is present. Basal
ganglia are intact. Insular ribbon is normal bilaterally. The
ventricles are of normal size. No significant extraaxial fluid
collection is present.

Vascular: Atherosclerotic calcifications are present within the
cavernous internal carotid arteries bilaterally. There is no
hyperdense vessel.

Skull: Calvarium is intact. No focal lytic or blastic lesions are
present.

Sinuses/Orbits: The paranasal sinuses and mastoid air cells are
clear. The globes and orbits are within normal limits.
IMPRESSION: 1. Extensive white matter hypoattenuation bilaterally. While this is
likely chronic. White matter infarcts may be obscured.
2. No acute or focal cortical infarct.
3. Atherosclerosis.

## 2020-11-21 ENCOUNTER — Encounter: Payer: Self-pay | Admitting: Cardiology

## 2020-11-21 ENCOUNTER — Ambulatory Visit: Payer: Medicare Other | Admitting: Cardiology

## 2020-11-21 ENCOUNTER — Other Ambulatory Visit: Payer: Self-pay

## 2020-11-21 VITALS — BP 152/100 | HR 90 | Ht 67.0 in

## 2020-11-21 DIAGNOSIS — R600 Localized edema: Secondary | ICD-10-CM

## 2020-11-21 DIAGNOSIS — R7303 Prediabetes: Secondary | ICD-10-CM | POA: Diagnosis not present

## 2020-11-21 DIAGNOSIS — I4819 Other persistent atrial fibrillation: Secondary | ICD-10-CM

## 2020-11-21 DIAGNOSIS — I340 Nonrheumatic mitral (valve) insufficiency: Secondary | ICD-10-CM

## 2020-11-21 DIAGNOSIS — I10 Essential (primary) hypertension: Secondary | ICD-10-CM | POA: Diagnosis not present

## 2020-11-21 DIAGNOSIS — I272 Pulmonary hypertension, unspecified: Secondary | ICD-10-CM

## 2020-11-21 MED ORDER — FUROSEMIDE 40 MG PO TABS: ORAL_TABLET | ORAL | 3 refills | Status: DC

## 2020-11-21 NOTE — Patient Instructions (Signed)
Medication Instructions:  Your physician has recommended you make the following change in your medication:  INCREASE: Lasix 40 mg in the morning and take 40 mg every other night.  *If you need a refill on your cardiac medications before your next appointment, please call your pharmacy*   Lab Work: Your physician recommends that you return for lab work in:  TODAY: BMET, CBC, Mag, TSH If you have labs (blood work) drawn today and your tests are completely normal, you will receive your results only by: MyChart Message (if you have MyChart) OR A paper copy in the mail If you have any lab test that is abnormal or we need to change your treatment, we will call you to review the results.   Testing/Procedures: None   Follow-Up: At [facility], you and your health needs are our priority.  As part of our continuing mission to provide you with exceptional heart care, we have created designated Provider Care Teams.  These Care Teams include your primary Cardiologist (physician) and Advanced Practice Providers (APPs -  Physician Assistants and Nurse Practitioners) who all work together to provide you with the care you need, when you need it.  We recommend signing up for the patient portal called "MyChart".  Sign up information is provided on this After Visit Summary.  MyChart is used to connect with patients for Virtual Visits (Telemedicine).  Patients are able to view lab/test results, encounter notes, upcoming appointments, etc.  Non-urgent messages can be sent to your provider as well.   To learn more about what you can do with MyChart, go to [email or web address].    Your next appointment:   6 month(s)  The format for your next appointment:   In Person  Provider:   Thomasene Ripple, DO [address], [place], [place] [address]    Other Instructions

## 2020-11-21 NOTE — Progress Notes (Signed)
Cardiology Office Note:    Date:  11/22/2020   ID:  Kaitlyn Rodriguez, DOB 12-14-1933, MRN [id number]  PCP:  Elisabeth Most, FNP  Cardiologist:  None  Electrophysiologist:  None   Referring MD: Elisabeth Most, FNP   Chief Complaint  Patient presents with   Follow-up    History of Present Illness:    Kaitlyn Rodriguez is a 87 y.o. female with a hx of repair type a aortic dissection in 2020, A. Fib, hypothyroidism.  I saw the patient 1 July 27, 2021 at that time she had not seen cardiology since her emergent surgery in 2020 for her type a aortic dissection the patient had follow-up with cardiothoracic surgery and had been advised multiple times to see cardiology she tells me but she has not been able to to make this appointment.    I saw the patient for the first time in June 09, 2020 at that time she was in atrial fibrillation with rapid ventricular rate I increase her Toprol-XL to 50 mg in the morning and 25 mg at night.  We continued her on her Eliquis.During her visit I also spoke with the patient with rhythm control strategy but she declined as she said she had bad experience with amiodarone and did not want to take any other medications. She was hypertensive.  But since increasing her beta-blocker we opted to wait and see what response she would get. An echocardiogram was ordered.    She followed up on July 27, 2020 at that time we discussed her echocardiogram results which show severe pulmonary arterial hypertension, moderate mitral regurgitation and moderate tricuspid regurgitation.  After discussing shared decision I placed the patient on diuretics to help with her volume overload.  We also discussed during that visit rhythm control for her atrial fibrillation.  But she declined any use of antiarrhythmics and her metoprolol was increased to 50 mg twice a day.  She is here today with her son.  She complains that she has been having some back pain.  She has not been able to see her PCP  as he had retired and they are waiting for an appointment with a new PCP.   Past Medical History:  Diagnosis Date   Anxiety disorder, unspecified    Benign essential hypertension    Dissection of unspecified site of aorta (HCC) 11/2018   Heart failure, unspecified (HCC)    History of aortic valve replacement    Hypothyroidism, unspecified    Unspecified osteoarthritis, unspecified site    Vitamin B12 deficiency anemia     Past Surgical History:  Procedure Laterality Date   ABDOMINAL HYSTERECTOMY     at age 73   REPLACEMENT ASCENDING AORTA N/A 12/20/2018   Procedure: REPAIR OF TYPE A AORTIC DISSECTION USING HEMASHIELD PLATINUM AND GRAFT;  Surgeon: Linden Dolin, MD;  Location: [facility] OR;  Service: Open Heart Surgery;  Laterality: N/A;   STERNAL CLOSURE  12/20/2018   Procedure: STERNAL PLATING;  Surgeon: Linden Dolin, MD;  Location: [facility] OR;  Service: Open Heart Surgery;;    Current Medications: Current Meds  Medication Sig   ALPRAZolam (XANAX) 0.25 MG tablet Take 12.5 mg by mouth 2 (two) times daily as needed for anxiety.   apixaban (ELIQUIS) 5 MG TABS tablet Take 1 tablet (5 mg total) by mouth 2 (two) times daily.   cyanocobalamin (,VITAMIN B-12,) 1000 MCG/ML injection Inject 1,000 mcg into the muscle every 30 (thirty) days.   ferrous sulfate  325 (65 FE) MG tablet Take 325 mg by mouth daily with breakfast.   furosemide (LASIX) 40 MG tablet Take 40 mg (one tablet) in the morning daily and take 40 mg (one tablet) every other night.   metoprolol succinate (TOPROL-XL) 50 MG 24 hr tablet Take 1 tablet (50 mg total) by mouth in the morning and at bedtime. Take with or immediately following a meal.   Multiple Vitamin (MULTIVITAMIN WITH MINERALS) TABS tablet Take 1 tablet by mouth daily.   SYNTHROID 125 MCG tablet Take 125 mcg by mouth every morning.   [DISCONTINUED] furosemide (LASIX) 40 MG tablet Take 1 tablet (40 mg total) by mouth daily.     Allergies:   Patient has no  known allergies.   Social History   Socioeconomic History   Marital status: Widowed    Spouse name: Not on file   Number of children: Not on file   Years of education: Not on file   Highest education level: Not on file  Occupational History   Not on file  Tobacco Use   Smoking status: Never   Smokeless tobacco: Never  Substance and Sexual Activity   Alcohol use: Yes   Drug use: Never   Sexual activity: Not on file  Other Topics Concern   Not on file  Social History Narrative   Not on file   Social Determinants of Health   Financial Resource Strain: Not on file  Food Insecurity: Not on file  Transportation Needs: Not on file  Physical Activity: Not on file  Stress: Not on file  Social Connections: Not on file     Family History: The patient's family history is not on file.  ROS:   Review of Systems  Constitution: Negative for decreased appetite, fever and weight gain.  HENT: Negative for congestion, ear discharge, hoarse voice and sore throat.   Eyes: Negative for discharge, redness, vision loss in right eye and visual halos.  Cardiovascular: Negative for chest pain, dyspnea on exertion, leg swelling, orthopnea and palpitations.  Respiratory: Negative for cough, hemoptysis, shortness of breath and snoring.   Endocrine: Negative for heat intolerance and polyphagia.  Hematologic/Lymphatic: Negative for bleeding problem. Does not bruise/bleed easily.  Skin: Negative for flushing, nail changes, rash and suspicious lesions.  Musculoskeletal: Negative for arthritis, joint pain, muscle cramps, myalgias, neck pain and stiffness.  Gastrointestinal: Negative for abdominal pain, bowel incontinence, diarrhea and excessive appetite.  Genitourinary: Negative for decreased libido, genital sores and incomplete emptying.  Neurological: Negative for brief paralysis, focal weakness, headaches and loss of balance.  Psychiatric/Behavioral: Negative for altered mental status, depression  and suicidal ideas.  Allergic/Immunologic: Negative for HIV exposure and persistent infections.    EKGs/Labs/Other Studies Reviewed:    The following studies were reviewed today:   EKG: None today    TTE 07/12/2020 IMPRESSIONS   1. Left ventricular ejection fraction, by estimation, is 60 to 65%. The left ventricle has normal function. The left ventricle has no regional wall motion abnormalities. There is mild left ventricular hypertrophy.  Left ventricular diastolic parameters  are indeterminate. There is Hypokinetic basal septum.   2. Right ventricular systolic function is normal. The right ventricular size is normal. There is severely elevated pulmonary artery systolic pressure.   3. Left atrial size was severely dilated.   4. Right atrial size was moderately dilated.   5. The mitral valve is normal in structure. Mild to moderate mitral valve regurgitation. No evidence of mitral stenosis.   6. Tricuspid valve  regurgitation is moderate.   7. The aortic valve is normal in structure. Aortic valve regurgitation is mild. Mild aortic valve sclerosis is present, with no evidence of aortic valve stenosis.   8. Descending aorta measured at 3.1cm.. There is mild dilatation of the  ascending aorta, measuring 39 mm.   9. The inferior vena cava is normal in size with greater than 50% respiratory variability, suggesting right atrial pressure of 3 mmHg.   FINDINGS   Left Ventricle: Left ventricular ejection fraction, by estimation, is 60  to 65%. The left ventricle has normal function. The left ventricle has no  regional wall motion abnormalities. The left ventricular internal cavity  size was normal in size. There is   mild left ventricular hypertrophy. Hypokinetic basal septum. Left  ventricular diastolic parameters are indeterminate.   Right Ventricle: The right ventricular size is normal. No increase in  right ventricular wall thickness. Right ventricular systolic function is  normal. There  is severely elevated pulmonary artery systolic pressure. The  tricuspid regurgitant velocity is  3.66 m/s, and with an assumed right atrial pressure of 8 mmHg, the  estimated right ventricular systolic pressure is 61.6 mmHg.   Left Atrium: Left atrial size was severely dilated.   Right Atrium: Right atrial size was moderately dilated.   Pericardium: There is no evidence of pericardial effusion.   Mitral Valve: The mitral valve is normal in structure. Mild to moderate  mitral valve regurgitation. No evidence of mitral valve stenosis.   Tricuspid Valve: The tricuspid valve is normal in structure. Tricuspid  valve regurgitation is moderate . No evidence of tricuspid stenosis.   Aortic Valve: The aortic valve is normal in structure. Aortic valve  regurgitation is mild. Mild aortic valve sclerosis is present, with no  evidence of aortic valve stenosis.   Pulmonic Valve: The pulmonic valve was normal in structure. Pulmonic valve  regurgitation is not visualized. No evidence of pulmonic stenosis.   Aorta: Descending aorta measured at 3.1cm. The aortic root is normal in  size and structure. There is mild dilatation of the ascending aorta,  measuring 39 mm.   Venous: The inferior vena cava is normal in size with greater than 50%  respiratory variability, suggesting right atrial pressure of 3 mmHg.   IAS/Shunts: No atrial level shunt detected by color flow Doppler.   Recent Labs: 07/27/2020: BUN 17; Creatinine, Ser 0.65; Magnesium 1.9; NT-Pro BNP 1,897; Potassium 4.8; Sodium 133  Recent Lipid Panel    Component Value Date/Time   CHOL 86 12/22/2018 0407   TRIG 58 12/22/2018 0407   HDL 22 (L) 12/22/2018 0407   CHOLHDL 3.9 12/22/2018 0407   VLDL 12 12/22/2018 0407   LDLCALC 52 12/22/2018 0407    Physical Exam:    VS:  BP (!) 152/100 (BP Location: Left Arm, Patient Position: Sitting, Cuff Size: Normal)   Pulse 90   Ht 5\' 7"  (1.702 m)   SpO2 95%   BMI 32.30 kg/m     Wt Readings  from Last 3 Encounters:  07/27/20 206 lb 3.2 oz (93.5 kg)  06/09/20 201 lb (91.2 kg)  02/16/19 212 lb (96.2 kg)     GEN: Well nourished, well developed in no acute distress HEENT: Normal NECK: No JVD; No carotid bruits LYMPHATICS: No lymphadenopathy CARDIAC: S1S2 noted,RRR, no murmurs, rubs, gallops RESPIRATORY:  Clear to auscultation without rales, wheezing or rhonchi  ABDOMEN: Soft, non-tender, non-distended, +bowel sounds, no guarding. EXTREMITIES: No edema, No cyanosis, no clubbing MUSCULOSKELETAL:  No deformity  SKIN: Warm and dry NEUROLOGIC:  Alert and oriented x 3, non-focal PSYCHIATRIC:  Normal affect, good insight  ASSESSMENT:    1. Hypertension, unspecified type   2. Bilateral leg edema   3. Prediabetes   4. Persistent atrial fibrillation (HCC)   5. Nonrheumatic mitral valve regurgitation   6. Pulmonary hypertension (HCC)    PLAN:     1.  She is hypertensive in the office today but unfortunately she tells me she had not been taking the Lasix as prescribed as well as her metoprolol.  I am hesitant to increase any antihypertensive given the fact that the patient has not been taking her current medications properly.  She is here in the office today with her son, we have discussed that the patient is going to start taking her medication as prescribed before adjusting antihypertensive regimen.  All in agreement. 2.  Her physical activities are limited due to her back pain. 3.  Continue her beta-blocker as well as her Eliquis. 4.  We will get blood work today.  The patient is in agreement with the above plan. The patient left the office in stable condition.  The patient will follow up in 6 months   Medication Adjustments/Labs and Tests Ordered: Current medicines are reviewed at length with the patient today.  Concerns regarding medicines are outlined above.  Orders Placed This Encounter  Procedures   Basic metabolic panel   Magnesium   CBC with Differential/Platelet    TSH   Meds ordered this encounter  Medications   furosemide (LASIX) 40 MG tablet    Sig: Take 40 mg (one tablet) in the morning daily and take 40 mg (one tablet) every other night.    Dispense:  180 tablet    Refill:  3    Patient Instructions  Medication Instructions:  Your physician has recommended you make the following change in your medication:  INCREASE: Lasix 40 mg in the morning and take 40 mg every other night.  *If you need a refill on your cardiac medications before your next appointment, please call your pharmacy*   Lab Work: Your physician recommends that you return for lab work in:  TODAY: BMET, CBC, Mag, TSH If you have labs (blood work) drawn today and your tests are completely normal, you will receive your results only by: MyChart Message (if you have MyChart) OR A paper copy in the mail If you have any lab test that is abnormal or we need to change your treatment, we will call you to review the results.   Testing/Procedures: None   Follow-Up: At [facility], you and your health needs are our priority.  As part of our continuing mission to provide you with exceptional heart care, we have created designated Provider Care Teams.  These Care Teams include your primary Cardiologist (physician) and Advanced Practice Providers (APPs -  Physician Assistants and Nurse Practitioners) who all work together to provide you with the care you need, when you need it.  We recommend signing up for the patient portal called "MyChart".  Sign up information is provided on this After Visit Summary.  MyChart is used to connect with patients for Virtual Visits (Telemedicine).  Patients are able to view lab/test results, encounter notes, upcoming appointments, etc.  Non-urgent messages can be sent to your provider as well.   To learn more about what you can do with MyChart, go to [email or web address].    Your next appointment:   6 month(s)  The format for your  next  appointment:   In Person  Provider:   Thomasene Ripple, DO [address], [place], [place] [address]    Other Instructions     Adopting a Healthy Lifestyle.  Know what a healthy weight is for you (roughly BMI <25) and aim to maintain this   Aim for 7+ servings of fruits and vegetables daily   65-80+ fluid ounces of water or unsweet tea for healthy kidneys   Limit to max 1 drink of alcohol per day; avoid smoking/tobacco   Limit animal fats in diet for cholesterol and heart health - choose grass fed whenever available   Avoid highly processed foods, and foods high in saturated/trans fats   Aim for low stress - take time to unwind and care for your mental health   Aim for 150 min of moderate intensity exercise weekly for heart health, and weights twice weekly for bone health   Aim for 7-9 hours of sleep daily   When it comes to diets, agreement about the perfect plan isnt easy to find, even among the experts. Experts at the [facility] [employer] developed an idea known as the Healthy Eating Plate. Just imagine a plate divided into logical, healthy portions.   The emphasis is on diet quality:   Load up on vegetables and fruits - one-half of your plate: Aim for color and variety, and remember that potatoes dont count.   Go for whole grains - one-quarter of your plate: Whole wheat, barley, wheat berries, quinoa, oats, brown rice, and foods made with them. If you want pasta, go with whole wheat pasta.   Protein power - one-quarter of your plate: Fish, chicken, beans, and nuts are all healthy, versatile protein sources. Limit red meat.   The diet, however, does go beyond the plate, offering a few other suggestions.   Use healthy plant oils, such as olive, canola, soy, corn, sunflower and peanut. Check the labels, and avoid partially hydrogenated oil, which have unhealthy trans fats.   If youre thirsty, drink water. Coffee and tea are good in moderation, but skip  sugary drinks and limit milk and dairy products to one or two daily servings.   The type of carbohydrate in the diet is more important than the amount. Some sources of carbohydrates, such as vegetables, fruits, whole grains, and beans-are healthier than others.   Finally, stay active  Signed, Thomasene Ripple, DO  11/22/2020 7:37 AM    [facility]

## 2020-11-22 ENCOUNTER — Telehealth: Payer: Self-pay

## 2020-11-22 LAB — CBC WITH DIFFERENTIAL/PLATELET
Basophils Absolute: 0.1 10*3/uL (ref 0.0–0.2)
Basos: 1 %
EOS (ABSOLUTE): 0.1 10*3/uL (ref 0.0–0.4)
Eos: 1 %
Hematocrit: 43.7 % (ref 34.0–46.6)
Hemoglobin: 15.1 g/dL (ref 11.1–15.9)
Immature Grans (Abs): 0 10*3/uL (ref 0.0–0.1)
Immature Granulocytes: 0 %
Lymphocytes Absolute: 1.2 10*3/uL (ref 0.7–3.1)
Lymphs: 15 %
MCH: 32.2 pg (ref 26.6–33.0)
MCHC: 34.6 g/dL (ref 31.5–35.7)
MCV: 93 fL (ref 79–97)
Monocytes Absolute: 0.8 10*3/uL (ref 0.1–0.9)
Monocytes: 10 %
Neutrophils Absolute: 5.8 10*3/uL (ref 1.4–7.0)
Neutrophils: 73 %
Platelets: 212 10*3/uL (ref 150–450)
RBC: 4.69 x10E6/uL (ref 3.77–5.28)
RDW: 12.9 % (ref 11.7–15.4)
WBC: 7.9 10*3/uL (ref 3.4–10.8)

## 2020-11-22 LAB — BASIC METABOLIC PANEL
BUN/Creatinine Ratio: 24 (ref 12–28)
BUN: 17 mg/dL (ref 8–27)
CO2: 25 mmol/L (ref 20–29)
Calcium: 9.4 mg/dL (ref 8.7–10.3)
Chloride: 92 mmol/L — ABNORMAL LOW (ref 96–106)
Creatinine, Ser: 0.72 mg/dL (ref 0.57–1.00)
Glucose: 89 mg/dL (ref 70–99)
Potassium: 4.6 mmol/L (ref 3.5–5.2)
Sodium: 131 mmol/L — ABNORMAL LOW (ref 134–144)
eGFR: 82 mL/min/{1.73_m2} (ref >59)

## 2020-11-22 LAB — TSH: TSH: 1.53 u[IU]/mL (ref 0.450–4.500)

## 2020-11-22 LAB — MAGNESIUM: Magnesium: 1.9 mg/dL (ref 1.6–2.3)

## 2020-11-22 NOTE — Telephone Encounter (Signed)
Called pt's PCP to let them know she needs a refill on her iron pills.

## 2020-12-03 ENCOUNTER — Emergency Department (HOSPITAL_COMMUNITY): Payer: Medicare Other

## 2020-12-03 ENCOUNTER — Other Ambulatory Visit: Payer: Self-pay

## 2020-12-03 ENCOUNTER — Inpatient Hospital Stay (HOSPITAL_COMMUNITY)
Admission: EM | Admit: 2020-12-03 | Discharge: 2020-12-06 | DRG: 069 | Disposition: A | Discharge status: Home Health | Payer: Medicare Other | Attending: Internal Medicine | Admitting: Internal Medicine

## 2020-12-03 ENCOUNTER — Encounter (HOSPITAL_COMMUNITY): Payer: Self-pay | Admitting: Emergency Medicine

## 2020-12-03 ENCOUNTER — Ambulatory Visit
Admission: EM | Admit: 2020-12-03 | Discharge: 2020-12-03 | Disposition: A | Discharge status: Other Acute Inpatient Hospital | Payer: Medicare Other

## 2020-12-03 ENCOUNTER — Observation Stay (HOSPITAL_COMMUNITY): Payer: Medicare Other

## 2020-12-03 DIAGNOSIS — Z5329 Procedure and treatment not carried out because of patient's decision for other reasons: Secondary | ICD-10-CM | POA: Diagnosis present

## 2020-12-03 DIAGNOSIS — I610 Nontraumatic intracerebral hemorrhage in hemisphere, subcortical: Secondary | ICD-10-CM | POA: Diagnosis present

## 2020-12-03 DIAGNOSIS — E785 Hyperlipidemia, unspecified: Secondary | ICD-10-CM | POA: Diagnosis present

## 2020-12-03 DIAGNOSIS — R2990 Unspecified symptoms and signs involving the nervous system: Secondary | ICD-10-CM | POA: Diagnosis not present

## 2020-12-03 DIAGNOSIS — Z90710 Acquired absence of both cervix and uterus: Secondary | ICD-10-CM

## 2020-12-03 DIAGNOSIS — I272 Pulmonary hypertension, unspecified: Secondary | ICD-10-CM | POA: Diagnosis present

## 2020-12-03 DIAGNOSIS — M545 Low back pain, unspecified: Secondary | ICD-10-CM

## 2020-12-03 DIAGNOSIS — G459 Transient cerebral ischemic attack, unspecified: Secondary | ICD-10-CM | POA: Diagnosis not present

## 2020-12-03 DIAGNOSIS — I639 Cerebral infarction, unspecified: Secondary | ICD-10-CM

## 2020-12-03 DIAGNOSIS — I5032 Chronic diastolic (congestive) heart failure: Secondary | ICD-10-CM

## 2020-12-03 DIAGNOSIS — Z20822 Contact with and (suspected) exposure to covid-19: Secondary | ICD-10-CM | POA: Diagnosis present

## 2020-12-03 DIAGNOSIS — R4182 Altered mental status, unspecified: Secondary | ICD-10-CM | POA: Diagnosis not present

## 2020-12-03 DIAGNOSIS — Z9889 Other specified postprocedural states: Secondary | ICD-10-CM

## 2020-12-03 DIAGNOSIS — R471 Dysarthria and anarthria: Secondary | ICD-10-CM | POA: Diagnosis present

## 2020-12-03 DIAGNOSIS — I5042 Chronic combined systolic (congestive) and diastolic (congestive) heart failure: Secondary | ICD-10-CM | POA: Diagnosis present

## 2020-12-03 DIAGNOSIS — I4821 Permanent atrial fibrillation: Secondary | ICD-10-CM | POA: Diagnosis present

## 2020-12-03 DIAGNOSIS — R29818 Other symptoms and signs involving the nervous system: Secondary | ICD-10-CM

## 2020-12-03 DIAGNOSIS — E875 Hyperkalemia: Secondary | ICD-10-CM | POA: Diagnosis present

## 2020-12-03 DIAGNOSIS — I10 Essential (primary) hypertension: Secondary | ICD-10-CM | POA: Diagnosis not present

## 2020-12-03 DIAGNOSIS — Z7901 Long term (current) use of anticoagulants: Secondary | ICD-10-CM

## 2020-12-03 DIAGNOSIS — E871 Hypo-osmolality and hyponatremia: Secondary | ICD-10-CM | POA: Diagnosis present

## 2020-12-03 DIAGNOSIS — Z952 Presence of prosthetic heart valve: Secondary | ICD-10-CM

## 2020-12-03 DIAGNOSIS — Z79899 Other long term (current) drug therapy: Secondary | ICD-10-CM

## 2020-12-03 DIAGNOSIS — R4702 Dysphasia: Secondary | ICD-10-CM | POA: Diagnosis present

## 2020-12-03 DIAGNOSIS — Z79890 Hormone replacement therapy (postmenopausal): Secondary | ICD-10-CM

## 2020-12-03 DIAGNOSIS — I509 Heart failure, unspecified: Secondary | ICD-10-CM

## 2020-12-03 DIAGNOSIS — R479 Unspecified speech disturbances: Secondary | ICD-10-CM

## 2020-12-03 DIAGNOSIS — I4819 Other persistent atrial fibrillation: Secondary | ICD-10-CM | POA: Diagnosis present

## 2020-12-03 DIAGNOSIS — Z8249 Family history of ischemic heart disease and other diseases of the circulatory system: Secondary | ICD-10-CM

## 2020-12-03 DIAGNOSIS — F419 Anxiety disorder, unspecified: Secondary | ICD-10-CM | POA: Diagnosis not present

## 2020-12-03 DIAGNOSIS — F418 Other specified anxiety disorders: Secondary | ICD-10-CM | POA: Diagnosis present

## 2020-12-03 DIAGNOSIS — Z8679 Personal history of other diseases of the circulatory system: Secondary | ICD-10-CM

## 2020-12-03 DIAGNOSIS — Z9981 Dependence on supplemental oxygen: Secondary | ICD-10-CM

## 2020-12-03 DIAGNOSIS — E039 Hypothyroidism, unspecified: Secondary | ICD-10-CM | POA: Diagnosis present

## 2020-12-03 DIAGNOSIS — R0602 Shortness of breath: Secondary | ICD-10-CM

## 2020-12-03 DIAGNOSIS — I110 Hypertensive heart disease with heart failure: Secondary | ICD-10-CM | POA: Diagnosis present

## 2020-12-03 DIAGNOSIS — R4701 Aphasia: Secondary | ICD-10-CM | POA: Diagnosis present

## 2020-12-03 DIAGNOSIS — E669 Obesity, unspecified: Secondary | ICD-10-CM | POA: Diagnosis present

## 2020-12-03 DIAGNOSIS — I725 Aneurysm of other precerebral arteries: Secondary | ICD-10-CM | POA: Diagnosis present

## 2020-12-03 DIAGNOSIS — M21372 Foot drop, left foot: Secondary | ICD-10-CM | POA: Diagnosis present

## 2020-12-03 DIAGNOSIS — I5041 Acute combined systolic (congestive) and diastolic (congestive) heart failure: Secondary | ICD-10-CM | POA: Diagnosis present

## 2020-12-03 HISTORY — DX: Other symptoms and signs involving the nervous system: R29.818

## 2020-12-03 LAB — I-STAT CHEM 8, ED
BUN: 18 mg/dL (ref 8–23)
Calcium, Ion: 1.1 mmol/L — ABNORMAL LOW (ref 1.15–1.40)
Chloride: 92 mmol/L — ABNORMAL LOW (ref 98–111)
Creatinine, Ser: 0.8 mg/dL (ref 0.44–1.00)
Glucose, Bld: 97 mg/dL (ref 70–99)
HCT: 43 % (ref 36.0–46.0)
Hemoglobin: 14.6 g/dL (ref 12.0–15.0)
Potassium: 6.2 mmol/L — ABNORMAL HIGH (ref 3.5–5.1)
Sodium: 127 mmol/L — ABNORMAL LOW (ref 135–145)
TCO2: 32 mmol/L (ref 22–32)

## 2020-12-03 LAB — COMPREHENSIVE METABOLIC PANEL
ALT: 11 U/L (ref 0–44)
AST: 23 U/L (ref 15–41)
Albumin: 3.3 g/dL — ABNORMAL LOW (ref 3.5–5.0)
Alkaline Phosphatase: 66 U/L (ref 38–126)
Anion gap: 7 (ref 5–15)
BUN: 10 mg/dL (ref 8–23)
CO2: 29 mmol/L (ref 22–32)
Calcium: 9.1 mg/dL (ref 8.9–10.3)
Chloride: 93 mmol/L — ABNORMAL LOW (ref 98–111)
Creatinine, Ser: 0.74 mg/dL (ref 0.44–1.00)
GFR, Estimated: >60 mL/min (ref >60)
Glucose, Bld: 105 mg/dL — ABNORMAL HIGH (ref 70–99)
Potassium: 4.2 mmol/L (ref 3.5–5.1)
Sodium: 129 mmol/L — ABNORMAL LOW (ref 135–145)
Total Bilirubin: 1.4 mg/dL — ABNORMAL HIGH (ref 0.3–1.2)
Total Protein: 6.2 g/dL — ABNORMAL LOW (ref 6.5–8.1)

## 2020-12-03 LAB — CBC WITH DIFFERENTIAL/PLATELET
Abs Immature Granulocytes: 0.03 10*3/uL (ref 0.00–0.07)
Basophils Absolute: 0 10*3/uL (ref 0.0–0.1)
Basophils Relative: 0 %
Eosinophils Absolute: 0 10*3/uL (ref 0.0–0.5)
Eosinophils Relative: 0 %
HCT: 46.5 % — ABNORMAL HIGH (ref 36.0–46.0)
Hemoglobin: 15.2 g/dL — ABNORMAL HIGH (ref 12.0–15.0)
Immature Granulocytes: 0 %
Lymphocytes Relative: 11 %
Lymphs Abs: 1 10*3/uL (ref 0.7–4.0)
MCH: 31.6 pg (ref 26.0–34.0)
MCHC: 32.7 g/dL (ref 30.0–36.0)
MCV: 96.7 fL (ref 80.0–100.0)
Monocytes Absolute: 0.6 10*3/uL (ref 0.1–1.0)
Monocytes Relative: 7 %
Neutro Abs: 7.2 10*3/uL (ref 1.7–7.7)
Neutrophils Relative %: 82 %
Platelets: 212 10*3/uL (ref 150–400)
RBC: 4.81 MIL/uL (ref 3.87–5.11)
RDW: 13.3 % (ref 11.5–15.5)
WBC: 8.9 10*3/uL (ref 4.0–10.5)
nRBC: 0 % (ref 0.0–0.2)

## 2020-12-03 LAB — PROTIME-INR
INR: 1.2 (ref 0.8–1.2)
Prothrombin Time: 14.8 seconds (ref 11.4–15.2)

## 2020-12-03 LAB — RESP PANEL BY RT-PCR (FLU A&B, COVID) ARPGX2
Influenza A by PCR: NEGATIVE
Influenza B by PCR: NEGATIVE
SARS Coronavirus 2 by RT PCR: NEGATIVE

## 2020-12-03 MED ORDER — SENNOSIDES-DOCUSATE SODIUM 8.6-50 MG PO TABS: 1.0000 | ORAL_TABLET | Freq: Every evening | ORAL | Status: DC | PRN

## 2020-12-03 MED ORDER — ALPRAZOLAM 0.25 MG PO TABS
0.1250 mg | ORAL_TABLET | Freq: Two times a day (BID) | ORAL | Status: DC | PRN
  Administered 2020-12-04 – 2020-12-05 (×4): 0.125 mg via ORAL
  Filled 2020-12-03 (×4): qty 1

## 2020-12-03 MED ORDER — LORAZEPAM 2 MG/ML IJ SOLN
0.5000 mg | Freq: Once | INTRAMUSCULAR | Status: AC
  Administered 2020-12-03: 0.5 mg via INTRAVENOUS
  Filled 2020-12-03: qty 1

## 2020-12-03 MED ORDER — IOHEXOL 350 MG/ML SOLN
100.0000 mL | Freq: Once | INTRAVENOUS | Status: AC | PRN
  Administered 2020-12-03: 100 mL via INTRAVENOUS

## 2020-12-03 MED ORDER — ACETAMINOPHEN 650 MG RE SUPP: 650.0000 mg | RECTAL | Status: DC | PRN

## 2020-12-03 MED ORDER — STROKE: EARLY STAGES OF RECOVERY BOOK: Freq: Once | Status: DC

## 2020-12-03 MED ORDER — ACETAMINOPHEN 325 MG PO TABS
650.0000 mg | ORAL_TABLET | ORAL | Status: DC | PRN
  Administered 2020-12-03 – 2020-12-05 (×4): 650 mg via ORAL
  Filled 2020-12-03 (×5): qty 2

## 2020-12-03 MED ORDER — METOPROLOL SUCCINATE ER 50 MG PO TB24
50.0000 mg | ORAL_TABLET | Freq: Two times a day (BID) | ORAL | Status: DC
  Administered 2020-12-04 – 2020-12-06 (×5): 50 mg via ORAL
  Filled 2020-12-03 (×5): qty 1

## 2020-12-03 MED ORDER — FUROSEMIDE 40 MG PO TABS
40.0000 mg | ORAL_TABLET | Freq: Two times a day (BID) | ORAL | Status: DC
  Administered 2020-12-04: 40 mg via ORAL
  Filled 2020-12-03: qty 1

## 2020-12-03 MED ORDER — ACETAMINOPHEN 160 MG/5ML PO SOLN: 650.0000 mg | ORAL | Status: DC | PRN

## 2020-12-03 MED ORDER — LEVOTHYROXINE SODIUM 25 MCG PO TABS
125.0000 ug | ORAL_TABLET | Freq: Every morning | ORAL | Status: DC
  Administered 2020-12-04 – 2020-12-06 (×3): 125 ug via ORAL
  Filled 2020-12-03 (×3): qty 1

## 2020-12-03 NOTE — Progress Notes (Signed)
Pt brought down to MRI for exam via pt transport. Pt safety screened via the pt and accompanying family member. Pt then prepared for exam and began scanning. Pt complaining of significant back pain and difficulty breathing while laying flat. Able to obtain limited brain imaging including AX and COR DWI sequences as well as MRA head imaging. Unable to obtain any MRA neck imaging before pt refused to continue stating, "I can't take it anymore, get me out". Pt then removed from scanner and sent back to ED via pt transport. RN and Dr made aware.

## 2020-12-03 NOTE — ED Provider Notes (Signed)
Emergency Medicine Provider Triage Evaluation Note  Kaitlyn Rodriguez , a 87 y.o. female  was evaluated in triage.  Pt complains of unable to "put a sentence together ", had 1 episode yesterday that lasted about an hour according to son, later resolved.  Had a second episode this morning.  Patient does recall these episodes.  Evaluated at urgent care and sent in for further evaluation.   Review of Systems  Positive: Changes in speech Negative: Chest pain, shortness of breath  Physical Exam  There were no vitals taken for this visit. Gen:   Awake, no distress   Resp:  Normal effort  MSK:   Moves extremities without difficulty  Other:  Moves upper and lower extremities with some limited range of motion due to describing these as sore.  No facial asymmetry, no dysarthria noted.  Medical Decision Making  Medically screening exam initiated at 12:14 PM.  Appropriate orders placed.  Barrett Henle was informed that the remainder of the evaluation will be completed by another provider, this initial triage assessment does not replace that evaluation, and the importance of remaining in the ED until their evaluation is complete.  She does have a prior history of aortic dissection type a repair in 2020, symptoms concerning for TIA.  We will begin work-up with basic blood work, along with CT head, denies any trauma.  Coags have been added.  Will likely need MRI brain.   Claude Manges, PA-C 12/03/20 1218    Gerhard Munch, MD 12/03/20 [phone]

## 2020-12-03 NOTE — Discharge Instructions (Signed)
Patient was sent to the hospital via EMS.  

## 2020-12-03 NOTE — ED Triage Notes (Signed)
Pt c/o "can't put a sentence together" and feels anxious, SOB. Denies pain. Onset yesterday. Symptoms are transient. Pt unable to identify today's date. Currently Strength equal on both sides, sensation is equal on both sides. Pronator drift not present.

## 2020-12-03 NOTE — ED Provider Notes (Signed)
[facility] EMERGENCY DEPARTMENT Provider Note   CSN: [id number] Arrival date & time: 12/03/20  1159     History No chief complaint on file.   Kaitlyn Rodriguez is a 87 y.o. female.  87 year old female with prior medical history as detailed below presents for evaluation.  Patient is complaining of 2 episodes of difficulty with her speech.  Yesterday morning she reports roughly an hour of difficulty expressing herself.  She had another episode this morning that also lasted about an hour.  Family reports that she was able to speak during the episode --however, her speech was slow and she used inappropriate words.  She is now back to baseline.  She denies any associated focal weakness.  She denies prior history of stroke per report.  She reports that her last dose of Eliquis was yesterday afternoon around 12:30 PM.  The history is provided by the patient.  Illness Location:  Transient episodes of difficulty with speech Severity:  Severe Onset quality:  Sudden Duration:  2 days Timing:  Intermittent Progression:  Resolved Chronicity:  New     Past Medical History:  Diagnosis Date   Anxiety disorder, unspecified    Benign essential hypertension    Dissection of unspecified site of aorta (HCC) 11/2018   Heart failure, unspecified (HCC)    History of aortic valve replacement    Hypothyroidism, unspecified    Unspecified osteoarthritis, unspecified site    Vitamin B12 deficiency anemia     Patient Active Problem List   Diagnosis Date Noted   Nonrheumatic mitral valve regurgitation 07/27/2020   Nonrheumatic aortic valve insufficiency 07/27/2020   Pulmonary hypertension (HCC) 07/27/2020   Obesity (BMI 30-39.9) 07/27/2020   Thyroid disease    Vitamin B12 deficiency anemia    Unspecified osteoarthritis, unspecified site    Hypothyroidism, unspecified    History of aortic valve replacement    Heart failure, unspecified (HCC)    Dependence on supplemental oxygen     Benign essential hypertension    Anxiety disorder, unspecified    Palliative care by specialist    Goals of care, counseling/discussion    Hypervolemia    Hyponatremia    Persistent atrial fibrillation (HCC)    Pleural effusion    Respiratory failure (HCC)    AMS (altered mental status)    S/P aortic aneurysm repair 12/21/2018   Dissecting aneurysm of thoracic aorta, 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     No family history on file.  Social History   Tobacco Use   Smoking status: Never   Smokeless tobacco: Never  Substance Use Topics   Alcohol use: Yes   Drug use: Never    Home Medications Prior to Admission medications   Medication Sig Start Date End Date Taking? Authorizing Provider  ALPRAZolam (XANAX) 0.25 MG tablet Take 0.125 mg by  mouth 2 (two) times daily as needed for anxiety.   Yes [provider]  apixaban (ELIQUIS) 5 MG TABS tablet Take 1 tablet (5 mg total) by mouth 2 (two) times daily. Patient taking differently: Take 5 mg by mouth daily. 01/08/19  Yes Doree Fudge M, PA-C  cyanocobalamin (,VITAMIN B-12,) 1000 MCG/ML injection Inject 1,000 mcg into the muscle every 30 (thirty) days. 03/02/20  Yes [provider]  ferrous sulfate 325 (65 FE) MG tablet Take 325 mg by mouth every other day.   Yes [provider]  furosemide (LASIX) 40 MG  tablet Take 40 mg (one tablet) in the morning daily and take 40 mg (one tablet) every other night. Patient taking differently: Take 40 mg by mouth 2 (two) times daily. 11/21/20  Yes Tobb, Kardie, DO  metoprolol succinate (TOPROL-XL) 50 MG 24 hr tablet Take 1 tablet (50 mg total) by mouth in the morning and at bedtime. Take with or immediately following a meal. 08/11/20  Yes Tobb, Kardie, DO  Multiple Vitamin (MULTIVITAMIN WITH MINERALS) TABS tablet Take 1 tablet by mouth daily. 01/08/19  Yes Ardelle Balls, PA-C  SYNTHROID 125 MCG tablet Take 125 mcg by mouth every morning. 04/22/20  Yes [provider]    Allergies    Patient has no known allergies.  Review of Systems   Review of Systems  All other systems reviewed and are negative.  Physical Exam Updated Vital Signs BP (!) 142/123   Pulse 90   Temp 98 F (36.7 C) (Oral)   Resp (!) 24   SpO2 92%   Physical Exam Vitals and nursing note reviewed.  Constitutional:      General: She is not in acute distress.    Appearance: Normal appearance. She is well-developed.  HENT:     Head: Normocephalic and atraumatic.  Eyes:     Conjunctiva/sclera: Conjunctivae normal.     Pupils: Pupils are equal, round, and reactive to light.  Cardiovascular:     Rate and Rhythm: Normal rate and regular rhythm.     Heart sounds: Normal heart sounds.  Pulmonary:     Effort: Pulmonary effort is normal. No respiratory distress.     Breath sounds: Normal breath sounds.  Abdominal:     General: There is no distension.     Palpations: Abdomen is soft.     Tenderness: There is no abdominal tenderness.  Musculoskeletal:        General: No deformity. Normal range of motion.     Cervical back: Normal range of motion and neck supple.  Skin:    General: Skin is warm and dry.  Neurological:     General: No focal deficit present.     Mental Status: She is alert and oriented to person, place, and time. Mental status is at baseline.     Cranial  Nerves: No cranial nerve deficit.     Sensory: No sensory deficit.     Motor: No weakness.     Coordination: Coordination normal.     Comments: Alert and oriented x4  Normal speech  No facial droop  VAN negative  5 of 5 strength in both upper and lower extremities    ED Results / Procedures / Treatments   Labs (all labs ordered are listed, but only abnormal results are displayed) Labs Reviewed  CBC WITH DIFFERENTIAL/PLATELET - Abnormal; Notable for the following components:      Result Value   Hemoglobin 15.2 (*)    HCT 46.5 (*)  All other components within normal limits  I-STAT CHEM 8, ED - Abnormal; Notable for the following components:   Sodium 127 (*)    Potassium 6.2 (*)    Chloride 92 (*)    Calcium, Ion 1.10 (*)    All other components within normal limits  RESP PANEL BY RT-PCR (FLU A&B, COVID) ARPGX2  PROTIME-INR  URINALYSIS, ROUTINE W REFLEX MICROSCOPIC  COMPREHENSIVE METABOLIC PANEL    EKG   Radiology CT Head Wo Contrast  Result Date: 12/03/2020 CLINICAL DATA:  Assess for TIA. EXAM: CT HEAD WITHOUT CONTRAST TECHNIQUE: Contiguous axial images were obtained from the base of the skull through the vertex without intravenous contrast. COMPARISON:  December 28, 2018 FINDINGS: Brain: There is question 1 cm aneurysm at the tip the basilar artery. There is small focal area of increased density in the right thalamus, consistent with focal hemorrhage. No evidence of acute infarction, hydrocephalus, extra-axial collection or mass lesion/mass effect. There is chronic diffuse atrophy. Chronic bilateral periventricular white matter small vessel ischemic changes are noted. Vascular: There is question 1 cm aneurysm at the tip of basilar artery. Skull: Normal. Negative for fracture or focal lesion. Sinuses/Orbits: No acute finding. Other: None. IMPRESSION: 1. Small focal hemorrhage in the right thalamus. 2. There is question 1 cm aneurysm at the tip of the basilar artery. Further  evaluation with MRA of the head is recommended. 3. Chronic diffuse atrophy. Chronic bilateral periventricular white matter small vessel ischemic change. These results will be called to the ordering clinician or representative by the Radiologist Assistant, and communication documented in the PACS or [employer]. Electronically Signed   By: Sherian Rein M.D.   On: 12/03/2020 13:37    Procedures Procedures   Medications Ordered in ED Medications  LORazepam (ATIVAN) injection 0.5 mg (0.5 mg Intravenous Given 12/03/20 1806)    ED Course  I have reviewed the triage vital signs and the nursing notes.  Pertinent labs & imaging results that were available during my care of the patient were reviewed by me and considered in my medical decision making (see chart for details).    MDM Rules/Calculators/A&P                          CRITICAL CARE Performed by: Wynetta Fines   Total critical care time: 30 minutes  Critical care time was exclusive of separately billable procedures and treating other patients.  Critical care was necessary to treat or prevent imminent or life-threatening deterioration.  Critical care was time spent personally by me on the following activities: development of treatment plan with patient and/or surrogate as well as nursing, discussions with consultants, evaluation of patient's response to treatment, examination of patient, obtaining history from patient or surrogate, ordering and performing treatments and interventions, ordering and review of laboratory studies, ordering and review of radiographic studies, pulse oximetry and re-evaluation of patient's condition.   MDM  MSE complete  REYLENE STAUDER was evaluated in Emergency Department on 12/03/2020 for the symptoms described in the history of present illness. She was evaluated in the context of the global COVID-19 pandemic, which necessitated consideration that the patient might be at risk for infection with the  SARS-CoV-2 virus that causes COVID-19. Institutional protocols and algorithms that pertain to the evaluation of patients at risk for COVID-19 are in a state of rapid change based on information released by regulatory bodies including the CDC and federal and state organizations. These policies and algorithms  were followed during the patient's care in the ED.  Patient presents with symptoms concerning for possible TIA.  CT imaging results discussed with neurology.  Neuro agrees with plan for further MRI imaging and plan to admit to medicine.  Neuro will consult.  Medicine to admit.    Final Clinical Impression(s) / ED Diagnoses Final diagnoses:  Speech disturbance, unspecified type    Rx / DC Orders ED Discharge Orders     None        Wynetta Fines, MD 12/03/20 2141

## 2020-12-03 NOTE — ED Triage Notes (Signed)
Pt to triage via [facility] from [facility].  Episode last night and again this morning with not being able to put a sentence together and feeling anxious.  Resolved.  No neuro deficits.  VAN negative.

## 2020-12-03 NOTE — Consult Note (Addendum)
NEURO HOSPITALIST CONSULT NOTE   Requesting physician: Dr. Rodena Medin  Reason for Consult: Two episodes of slurred speech and AMS  History obtained from:   Patient and Chart     HPI:                                                                                                                                          Kaitlyn Rodriguez is an 87 y.o. female with a PMHx of anxiety disorder, HTN, type A aortic dissection s/p repair (2020), heart failure (unspecified), aortic valve replacement, atrial fibrillation (on apixaban), hypothyroidism, osteoarthritis and vitamin B12 deficiency anemia, who initially presented to urgent care today with stated concern that she might be having a stroke. Her family member reports that patient has had 2 episodes of slurred speech and altered mental status that started Friday at around 1 AM, the first occurring at 1 AM and the second episode occurring Saturday morning at approximately 8 AM.  The patient's family reported that these episodes lasted approximately 1 hour each and that during them, the patient was unable to piece together sentences.   The patient herself characterized the deficit as "can't put a sentence together". She was also feeling anxious and was complaining of SOB.   Home medications include Eliquis.   Past Medical History:  Diagnosis Date   Anxiety disorder, unspecified    Benign essential hypertension    Dissection of unspecified site of aorta (HCC) 11/2018   Heart failure, unspecified (HCC)    History of aortic valve replacement    Hypothyroidism, unspecified    Unspecified osteoarthritis, unspecified site    Vitamin B12 deficiency anemia     Past Surgical History:  Procedure Laterality Date   ABDOMINAL HYSTERECTOMY     at age 12   REPLACEMENT ASCENDING AORTA N/A 12/20/2018   Procedure: REPAIR OF TYPE A AORTIC DISSECTION USING HEMASHIELD PLATINUM AND GRAFT;  Surgeon: Linden Dolin, MD;  Location: [facility] OR;   Service: Open Heart Surgery;  Laterality: N/A;   STERNAL CLOSURE  12/20/2018   Procedure: STERNAL PLATING;  Surgeon: Linden Dolin, MD;  Location: [facility] OR;  Service: Open Heart Surgery;;    No family history on file.            Social History:  reports that she has never smoked. She has never used smokeless tobacco. She reports current alcohol use. She reports that she does not use drugs.  No Known Allergies  MEDICATIONS:  No current facility-administered medications on file prior to encounter.   Current Outpatient Medications on File Prior to Encounter  Medication Sig Dispense Refill   ALPRAZolam (XANAX) 0.25 MG tablet Take 0.125 mg by mouth 2 (two) times daily as needed for anxiety.     apixaban (ELIQUIS) 5 MG TABS tablet Take 1 tablet (5 mg total) by mouth 2 (two) times daily. (Patient taking differently: Take 5 mg by mouth daily.) 60 tablet 1   cyanocobalamin (,VITAMIN B-12,) 1000 MCG/ML injection Inject 1,000 mcg into the muscle every 30 (thirty) days.     ferrous sulfate 325 (65 FE) MG tablet Take 325 mg by mouth every other day.     furosemide (LASIX) 40 MG tablet Take 40 mg (one tablet) in the morning daily and take 40 mg (one tablet) every other night. (Patient taking differently: Take 40 mg by mouth 2 (two) times daily.) 180 tablet 3   metoprolol succinate (TOPROL-XL) 50 MG 24 hr tablet Take 1 tablet (50 mg total) by mouth in the morning and at bedtime. Take with or immediately following a meal. 180 tablet 3   Multiple Vitamin (MULTIVITAMIN WITH MINERALS) TABS tablet Take 1 tablet by mouth daily.     SYNTHROID 125 MCG tablet Take 125 mcg by mouth every morning.       ROS:                                                                                                                                       No blurred vision, dizziness, nausea, vomiting, chest  pain, shortness of breath.  In the context of mild confusion, the patient does not endorse any symptoms other than as noted in the HPI.    Blood pressure (!) 145/99, pulse 94, temperature 98 F (36.7 C), temperature source Oral, resp. rate 20, SpO2 96 %.   General Examination:                                                                                                       Physical Exam  HEENT-  [place]/AT    Lungs- Respirations unlabored Extremities- Mild pretibial pitting edema  Neurological Examination Mental Status: Awake and alert. Able to follow all simple commands, but has difficulty requiring multiple clarifications to perform some multistep commands. Speech is fluent without errors of grammar or syntax. Repetition was impaired. Able to name thumb, pinky and index fingers but not a ring or middle finger. Rare phonemic paraphasias noted, worst when asked  to repeat a phrase. Oriented to situation, state, day of the week and year, but not the month or the city.  Cranial Nerves: II: Temporal visual fields intact with no extinction to DSS. PERRL.   III,IV, VI: No ptosis. EOMI without nystagmus.  V,VII: No facial droop VIII: hearing intact to voice IX,X: No hypophonia XI: Symmetric XII: Midline tongue extension Motor: RUE and RLE 5/5 LUE 5/5 LLE 4/5 HF and KE. ADF 2/5.  No pronator drift. Sensory: Tem and light touch sensation intact throughout, bilaterally. No extinction to DSS.  Deep Tendon Reflexes: 2+ bilateral brachioradialis. Trace patellar reflexes. 0 achilles bilaterally.  Plantars: Right upgoing toe. Left toe mute.  Cerebellar: No ataxia with FNF bilaterally Gait: Deferred   Lab Results: Basic Metabolic Panel: Recent Labs  Lab 12/03/20 1734  NA 127*  K 6.2*  CL 92*  GLUCOSE 97  BUN 18  CREATININE 0.80    CBC: Recent Labs  Lab 12/03/20 1214 12/03/20 1734  WBC 8.9  --   NEUTROABS 7.2  --   HGB 15.2* 14.6  HCT 46.5* 43.0  MCV 96.7  --   PLT 212  --      Cardiac Enzymes: No results for input(s): CKTOTAL, CKMB, CKMBINDEX, TROPONINI in the last 168 hours.  Lipid Panel: No results for input(s): CHOL, TRIG, HDL, CHOLHDL, VLDL, LDLCALC in the last 168 hours.  Imaging: CT Head Wo Contrast  Result Date: 12/03/2020 CLINICAL DATA:  Assess for TIA. EXAM: CT HEAD WITHOUT CONTRAST TECHNIQUE: Contiguous axial images were obtained from the base of the skull through the vertex without intravenous contrast. COMPARISON:  December 28, 2018 FINDINGS: Brain: There is question 1 cm aneurysm at the tip the basilar artery. There is small focal area of increased density in the right thalamus, consistent with focal hemorrhage. No evidence of acute infarction, hydrocephalus, extra-axial collection or mass lesion/mass effect. There is chronic diffuse atrophy. Chronic bilateral periventricular white matter small vessel ischemic changes are noted. Vascular: There is question 1 cm aneurysm at the tip of basilar artery. Skull: Normal. Negative for fracture or focal lesion. Sinuses/Orbits: No acute finding. Other: None. IMPRESSION: 1. Small focal hemorrhage in the right thalamus. 2. There is question 1 cm aneurysm at the tip of the basilar artery. Further evaluation with MRA of the head is recommended. 3. Chronic diffuse atrophy. Chronic bilateral periventricular white matter small vessel ischemic change. These results will be called to the ordering clinician or representative by the Radiologist Assistant, and communication documented in the PACS or [employer]. Electronically Signed   By: Sherian Rein M.D.   On: 12/03/2020 13:37     Assessment: 87 year old female presenting with transient episodes of dysphasia 1. Exam reveals mild receptive and expressive dysphasia. LLE weakness proximally with foot drop distally, as well as depressed lower extremity reflexes also noted. No focal motor deficit, facial droop, sensory loss or ataxia is noted.  2. CT head: Small focal  hemorrhage in the right thalamus. A small calcification is seen slightly anterior to this. There is a possible 1 cm aneurysm at the tip of the basilar artery. Also noted is chronic diffuse atrophy and chronic bilateral periventricular white matter small vessel ischemic change. 3. The patient was only able to tolerate an abbreviated MRI scan, with diffusion weighted images only, due to orthopnea and back pain while in the scanner. MRA was aborted. The DWI images reveal right thalamic susceptibility artifact suggestive of focal hemorrhage or mineralization at the location of the hemorrhage  that was seen on CT.  4. Hyponatremic and hyperkalemic (Na 127, K 6.2). 5. EKG: (Suspect arm lead reversal, interpretation assumes no reversal); Atrial fibrillation with rapid ventricular response with premature ventricular or aberrantly conducted complexes; Anterolateral infarct, age undetermined.  6. Hypothyroidism. TSH is normal.    Recommendations: 1. CTA of head and neck. 2. TTE 3. Cardiac telemetry.  4. Hold Eliquis.  5. Further recommendations pending results of the above testing.   Addendum: CTA of head and neck: 1. No intracranial arterial occlusion or high-grade stenosis. 2. 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 3. Unchanged appearance of small focus of hyperdensity within the right thalamus. This is less likely to be acute hemorrhage given the lack of edema and the relatively low density compared to the typical appearance of acute blood. Possibilities is include cavernous angioma, mineralization or subacute blood. MRI without contrast may be helpful if the patient can tolerate the full examination. 4. Status post ascending aortic dissection repair, incompletely visualized on this study. The ascending aortic diameter measures 5.3 cm and there are areas of luminal irregularity that are indeterminate without prior studies for comparison. 5. Biapical ground-glass  opacity, likely pulmonary edema.   Electronically signed: Dr. Caryl Pina 12/03/2020, 7:40 PM

## 2020-12-03 NOTE — ED Notes (Signed)
MRI contacted this RN regarding pts inability to complete scans due to back pain. Rodena Medin, MD made aware. Pt in ER room at this time on monitor.

## 2020-12-03 NOTE — ED Provider Notes (Signed)
[facility]    CSN: [id number] Arrival date & time: 12/03/20  1024      History   Chief Complaint Chief Complaint  Patient presents with   Altered Mental Status    HPI Kaitlyn Rodriguez is a 87 y.o. female.   Patient presents to the urgent care with concerns of stroke.  Patient's family member reports that patient has had 2 episodes of slurred speech and altered mental status that started yesterday around 1 AM.  One episode occurred at 1 AM yesterday morning and the second episode occurred this morning at approximately 8 AM.  Patient's family reports that these episodes last approximately 1 hour and patient is unable to piece together sentences.  Patient denies blurred vision, dizziness, nausea, vomiting, chest pain, shortness of breath.  Patient is in wheelchair but declines any weakness or decrease sensation in extremities.  Pertinent medical history includes hypertension, dissection of aorta, heart failure.   Altered Mental Status  Past Medical History:  Diagnosis Date   Anxiety disorder, unspecified    Benign essential hypertension    Dissection of unspecified site of aorta (HCC) 11/2018   Heart failure, unspecified (HCC)    History of aortic valve replacement    Hypothyroidism, unspecified    Unspecified osteoarthritis, unspecified site    Vitamin B12 deficiency anemia     Patient Active Problem List   Diagnosis Date Noted   Nonrheumatic mitral valve regurgitation 07/27/2020   Nonrheumatic aortic valve insufficiency 07/27/2020   Pulmonary hypertension (HCC) 07/27/2020   Obesity (BMI 30-39.9) 07/27/2020   Thyroid disease    Vitamin B12 deficiency anemia    Unspecified osteoarthritis, unspecified site    Hypothyroidism, unspecified    History of aortic valve replacement    Heart failure, unspecified (HCC)    Dependence on supplemental oxygen    Benign essential hypertension    Anxiety disorder, unspecified    Palliative care by specialist    Goals of  care, counseling/discussion    Hypervolemia    Hyponatremia    Persistent atrial fibrillation (HCC)    Pleural effusion    Respiratory failure (HCC)    AMS (altered mental status)    S/P aortic aneurysm repair 12/21/2018   Dissecting aneurysm of thoracic aorta, 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      Home Medications    Prior to Admission medications   Medication Sig Start Date End Date Taking? Authorizing Provider  ALPRAZolam (XANAX) 0.25 MG tablet Take 12.5 mg by mouth 2 (two) times daily as needed for anxiety.    [provider]  apixaban (ELIQUIS) 5 MG TABS tablet Take 1 tablet (5 mg total) by mouth 2 (two) times daily. 01/08/19   Ardelle Balls, PA-C  cyanocobalamin (,VITAMIN B-12,) 1000 MCG/ML injection Inject 1,000 mcg into the muscle every 30 (thirty) days. 03/02/20  [provider]  ferrous sulfate 325 (65 FE) MG tablet Take 325 mg by mouth daily with breakfast.    [provider]  furosemide (LASIX) 40 MG tablet Take 40 mg (one tablet) in the morning daily and take 40 mg (one tablet) every other night. 11/21/20   Tobb, Kardie, DO  metoprolol succinate (TOPROL-XL) 50 MG 24 hr tablet Take 1 tablet (50 mg total) by mouth in the morning and at bedtime. Take with or immediately following a meal. 08/11/20   Tobb, Kardie, DO  Multiple Vitamin (MULTIVITAMIN WITH  MINERALS) TABS tablet Take 1 tablet by mouth daily. 01/08/19   Ardelle Balls, PA-C  SYNTHROID 125 MCG tablet Take 125 mcg by mouth every morning. 04/22/20   [provider]    Family History History reviewed. No pertinent family history.  Social History Social History   Tobacco Use   Smoking status: Never   Smokeless tobacco: Never  Substance Use Topics   Alcohol use: Yes   Drug use: Never     Allergies   Patient has no known allergies.   Review of Systems Review of Systems Per HPI  Physical Exam Triage Vital Signs ED Triage Vitals [12/03/20 1032]  Enc Vitals Group     BP (!) 164/112     Pulse Rate 96     Resp      Temp 97.8 F (36.6 C)     Temp Source Oral     SpO2 90 %     Weight      Height      Head Circumference      Peak Flow      Pain Score 0     Pain Loc      Pain Edu?      Excl. in GC?    No data found.  Updated Vital Signs BP (!) 164/112 (BP Location: Right Arm)   Pulse 96   Temp 97.8 F (36.6 C) (Oral)   SpO2 90%   Visual Acuity Right Eye Distance:   Left Eye Distance:   Bilateral Distance:    Right Eye Near:   Left Eye Near:    Bilateral Near:     Physical Exam Constitutional:      General: She is not in acute distress.    Appearance: Normal appearance. She is not toxic-appearing or diaphoretic.  HENT:     Head: Normocephalic and atraumatic.  Eyes:     Extraocular Movements: Extraocular movements intact.     Conjunctiva/sclera: Conjunctivae normal.     Pupils: Pupils are equal, round, and reactive to light.  Cardiovascular:     Rate and Rhythm: Normal rate and regular rhythm.     Pulses: Normal pulses.     Heart sounds: Normal heart sounds.  Pulmonary:     Effort: Pulmonary effort is normal. No respiratory distress.     Breath sounds: Normal breath sounds.  Skin:    General: Skin is warm and dry.  Neurological:     General: No focal deficit present.     Mental Status: She is alert and oriented to person,  place, and time. Mental status is at baseline.     Cranial Nerves: Cranial nerves are intact.     Sensory: Sensation is intact.     Comments: Neuro exam fairly normal.  Patient is in wheelchair in exam room.  Psychiatric:        Mood and Affect: Mood normal.        Behavior:  Behavior normal.        Thought Content: Thought content normal.        Judgment: Judgment normal.     UC Treatments / Results  Labs (all labs ordered are listed, but only abnormal results are displayed) Labs Reviewed - No data to display  EKG   Radiology No results found.  Procedures Procedures (including critical care time)  Medications Ordered in UC Medications - No data to display  Initial Impression / Assessment and Plan / UC Course  I have reviewed the triage vital signs and the nursing notes.  Pertinent labs & imaging results that were available during my care of the patient were reviewed by me and considered in my medical decision making (see chart for details).     Physical exam and neuro exam were fairly normal, although patient symptoms have subsided since episode that occurred this morning.  Patient will need further evaluation and management at the hospital due to recurrent episodes and symptoms that occurred during episodes that are worrisome for TIA or cerebrovascular accident.  Patient was advised to go to the hospital.  Patient left urgent care via EMS. Final Clinical Impressions(s) / UC Diagnoses   Final diagnoses:  Stroke-like symptoms  Altered mental status, unspecified altered mental status type     Discharge Instructions      Patient was sent to the hospital via EMS.     ED Prescriptions   None    PDMP not reviewed this encounter.   Lance Muss, FNP 12/03/20 1051

## 2020-12-03 NOTE — H&P (Signed)
History and Physical   Kaitlyn Rodriguez JQB:[id number] DOB: August 10, 1933 DOA: 12/03/2020  PCP: Elisabeth Most, FNP   Patient coming from: Home  Chief Complaint: Confusion, slurred speech  HPI: Kaitlyn Rodriguez is a 87 y.o. female with medical history significant of anxiety, hypertension, oxygen dependence, aortic dissection, aortic valve replacement, CHF, hypothyroidism, atrial fibrillation, pleural effusion, pulmonary hypertension presenting after having issues with speech and confusion.  Some history obtained with assistance of chart review.  Patient and family reports she had 2 episodes of altered mental status and slurred speech.  She had 1 episode at 1 AM yesterday and 1 at 8 AM today.  They lasted about 1 hour apiece.  At this time as she states it felt like she could not put a sentence together.  She also reports some associated anxiety and shortness of breath.  She initially presented to the urgent care and she subsequently was sent to the ED for further evaluation.  She denies fevers, chills, chest pain, abdominal pain, constipation, diarrhea, nausea, vomiting.   ED Course: Vital signs in the ED significant for blood pressure in the 130s to 160s systolic and heart rate of 90s to 100s.  Lab work-up showed i-STAT CHEM panel with sodium 127, potassium 6.2, chloride 92.  CBC with hemoglobin 15.2, PT, INR within normal limits, respiratory flu COVID-negative, urinalysis pending.  CT head showed small focal hemorrhage at thalamus with a questionable 1 cm aneurysm also noted was chronic changes.  Case was discussed with neurology who initially agreed with further evaluation with MRI and MRA of the brain to determine neck steps.  Full evaluation from neurology is still pending.  Review of Systems: As per HPI otherwise all other systems reviewed and are negative.  Past Medical History:  Diagnosis Date   Anxiety disorder, unspecified    Benign essential hypertension    Dissection of unspecified  site of aorta (HCC) 11/2018   Heart failure, unspecified (HCC)    History of aortic valve replacement    Hypothyroidism, unspecified    Unspecified osteoarthritis, unspecified site    Vitamin B12 deficiency anemia     Past Surgical History:  Procedure Laterality Date   ABDOMINAL HYSTERECTOMY     at age 80   REPLACEMENT ASCENDING AORTA N/A 12/20/2018   Procedure: REPAIR OF TYPE A AORTIC DISSECTION USING HEMASHIELD PLATINUM AND GRAFT;  Surgeon: Linden Dolin, MD;  Location: [facility] OR;  Service: Open Heart Surgery;  Laterality: N/A;   STERNAL CLOSURE  12/20/2018   Procedure: STERNAL PLATING;  Surgeon: Linden Dolin, MD;  Location: [facility] OR;  Service: Open Heart Surgery;;    Social History  reports that she has never smoked. She has never used smokeless tobacco. She reports current alcohol use. She reports that she does not use drugs.  No Known Allergies  No family history on file. Reviewed on admission  Prior to Admission medications   Medication Sig Start Date End Date Taking? Authorizing Provider  ALPRAZolam (XANAX) 0.25 MG tablet Take 0.125 mg by mouth 2 (two) times daily as needed for anxiety.   Yes [provider]  apixaban (ELIQUIS) 5 MG TABS tablet Take 1 tablet (5 mg total) by mouth 2 (two) times daily. Patient taking differently: Take 5 mg by mouth daily. 01/08/19  Yes Doree Fudge M, PA-C  cyanocobalamin (,VITAMIN B-12,) 1000 MCG/ML injection Inject 1,000 mcg into the muscle every 30 (thirty) days. 03/02/20  Yes [provider]  ferrous sulfate 325 (65 FE)  MG tablet Take 325 mg by mouth every other day.   Yes [provider]  furosemide (LASIX) 40 MG tablet Take 40 mg (one tablet) in the morning daily and take 40 mg (one tablet) every other night. Patient taking differently: Take 40 mg by mouth 2 (two) times daily. 11/21/20  Yes Tobb, Kardie, DO  metoprolol succinate (TOPROL-XL) 50 MG 24 hr tablet Take 1 tablet (50 mg total) by mouth  in the morning and at bedtime. Take with or immediately following a meal. 08/11/20  Yes Tobb, Kardie, DO  Multiple Vitamin (MULTIVITAMIN WITH MINERALS) TABS tablet Take 1 tablet by mouth daily. 01/08/19  Yes Ardelle Balls, PA-C  SYNTHROID 125 MCG tablet Take 125 mcg by mouth every morning. 04/22/20  Yes [provider]    Physical Exam: Vitals:   12/03/20 1730 12/03/20 1745 12/03/20 1800 12/03/20 1930  BP: (!) 151/111   (!) 145/99  Pulse: 91 93 (!) 102 94  Resp: 17 20 18 20   Temp:      TempSrc:      SpO2: 95% 94% 92% 96%   Physical Exam Constitutional:      General: She is not in acute distress.    Appearance: Normal appearance.  HENT:     Head: Normocephalic and atraumatic.     Mouth/Throat:     Mouth: Mucous membranes are moist.     Pharynx: Oropharynx is clear.  Eyes:     Extraocular Movements: Extraocular movements intact.     Pupils: Pupils are equal, round, and reactive to light.  Cardiovascular:     Rate and Rhythm: Normal rate and regular rhythm.     Pulses: Normal pulses.     Heart sounds: Normal heart sounds.  Pulmonary:     Effort: Pulmonary effort is normal. No respiratory distress.     Breath sounds: Normal breath sounds.  Abdominal:     General: Bowel sounds are normal. There is no distension.     Palpations: Abdomen is soft.     Tenderness: There is no abdominal tenderness.  Musculoskeletal:        General: No swelling or deformity.  Skin:    General: Skin is warm and dry.  Neurological:     Comments: Mental Status: Patient is awake, alert, oriented to person and place but not year No signs of aphasia or neglect Cranial Nerves: II: Pupils equal, round, and reactive to light.   III,IV, VI: EOMI without ptosis or diploplia.  V: Facial sensation is symmetric to light touch. VII: Facial movement is symmetric.  VIII: hearing is intact to voice X: Uvula elevates symmetrically XI: Shoulder shrug is symmetric. XII: tongue is midline without  atrophy or fasciculations.  Motor: Good effort thorughout, at Least 5/5 bilateral UE, 5/5 bilateral lower extremitiy  Sensory: Sensation is grossly intact bilateral UEs & LEs Cerebellar: Finger-Nose intact bilalat     Labs on Admission: I have personally reviewed following labs and imaging studies  CBC: Recent Labs  Lab 12/03/20 1214 12/03/20 1734  WBC 8.9  --   NEUTROABS 7.2  --   HGB 15.2* 14.6  HCT 46.5* 43.0  MCV 96.7  --   PLT 212  --     Basic Metabolic Panel: Recent Labs  Lab 12/03/20 1734  NA 127*  K 6.2*  CL 92*  GLUCOSE 97  BUN 18  CREATININE 0.80    GFR: CrCl cannot be calculated (Unknown ideal weight.).  Liver Function Tests: No results for input(s): AST,  ALT, ALKPHOS, BILITOT, PROT, ALBUMIN in the last 168 hours.  Urine analysis: No results found for: COLORURINE, APPEARANCEUR, LABSPEC, PHURINE, GLUCOSEU, HGBUR, BILIRUBINUR, KETONESUR, PROTEINUR, UROBILINOGEN, NITRITE, LEUKOCYTESUR  Radiological Exams on Admission: CT Head Wo Contrast  Result Date: 12/03/2020 CLINICAL DATA:  Assess for TIA. EXAM: CT HEAD WITHOUT CONTRAST TECHNIQUE: Contiguous axial images were obtained from the base of the skull through the vertex without intravenous contrast. COMPARISON:  December 28, 2018 FINDINGS: Brain: There is question 1 cm aneurysm at the tip the basilar artery. There is small focal area of increased density in the right thalamus, consistent with focal hemorrhage. No evidence of acute infarction, hydrocephalus, extra-axial collection or mass lesion/mass effect. There is chronic diffuse atrophy. Chronic bilateral periventricular white matter small vessel ischemic changes are noted. Vascular: There is question 1 cm aneurysm at the tip of basilar artery. Skull: Normal. Negative for fracture or focal lesion. Sinuses/Orbits: No acute finding. Other: None. IMPRESSION: 1. Small focal hemorrhage in the right thalamus. 2. There is question 1 cm aneurysm at the tip of the basilar  artery. Further evaluation with MRA of the head is recommended. 3. Chronic diffuse atrophy. Chronic bilateral periventricular white matter small vessel ischemic change. These results will be called to the ordering clinician or representative by the Radiologist Assistant, and communication documented in the PACS or [employer]. Electronically Signed   By: Sherian Rein M.D.   On: 12/03/2020 13:37   MR ANGIO HEAD WO CONTRAST  Result Date: 12/03/2020 CLINICAL DATA:  Acute neurologic deficit EXAM: MRI HEAD WITHOUT CONTRAST MRA HEAD WITHOUT CONTRAST TECHNIQUE: Multiplanar, multi-echo pulse sequences of the brain and surrounding structures were acquired without intravenous contrast. Angiographic images of the Circle of Willis were acquired using MRA technique without intravenous contrast. COMPARISON:  No pertinent prior exam. FINDINGS: MRI HEAD FINDINGS Limited brain MRI protocol was utilized, consisting only of axial diffusion weighted images. These images show no acute infarct. There is magnetic susceptibility effects at the site of the hyperdensity in the right thalamus. MRA HEAD FINDINGS MRA images are motion degraded. POSTERIOR CIRCULATION: --Vertebral arteries: Normal --Inferior cerebellar arteries: Normal. --Basilar artery: Dilated and ectatic distal basilar artery with diameter of 6 mm. --Superior cerebellar arteries: Normal. --Posterior cerebral arteries: Loss of normal flow related enhancement in the right posterior cerebral artery. Fetal origin of the left. ANTERIOR CIRCULATION: --Intracranial internal carotid arteries: Normal. --Anterior cerebral arteries (ACA): Normal. --Middle cerebral arteries (MCA): Normal. ANATOMIC VARIANTS: Fetal origin of the left PCA. IMPRESSION: 1. Truncated and motion degraded examination.  No acute infarct. 2. Area of magnetic susceptibility effects in the right thalamus, corresponding to the hyperdensity seen on the earlier CT. While this may be a small focus of  hemorrhage, it is indeterminate and could also indicate a cavernous angioma focus of mineralization. When the patient can tolerate a, a complete MRI with susceptibility weighted imaging might be helpful. 3. 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 CTA of the head can provide better spatial resolution if patient motion could be minimized. 4. 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 Electronically Signed   By: Deatra Robinson M.D.   On: 12/03/2020 20:11   MR BRAIN WO CONTRAST  Result Date: 12/03/2020 CLINICAL DATA:  Acute neurologic deficit EXAM: MRI HEAD WITHOUT CONTRAST MRA HEAD WITHOUT CONTRAST TECHNIQUE: Multiplanar, multi-echo pulse sequences of the brain and surrounding structures were acquired without  intravenous contrast. Angiographic images of the Circle of Willis were acquired using MRA technique without intravenous contrast. COMPARISON:  No pertinent prior exam. FINDINGS: MRI HEAD FINDINGS Limited brain MRI protocol was utilized, consisting only of axial diffusion weighted images. These images show no acute infarct. There is magnetic susceptibility effects at the site of the hyperdensity in the right thalamus. MRA HEAD FINDINGS MRA images are motion degraded. POSTERIOR CIRCULATION: --Vertebral arteries: Normal --Inferior cerebellar arteries: Normal. --Basilar artery: Dilated and ectatic distal basilar artery with diameter of 6 mm. --Superior cerebellar arteries: Normal. --Posterior cerebral arteries: Loss of normal flow related enhancement in the right posterior cerebral artery. Fetal origin of the left. ANTERIOR CIRCULATION: --Intracranial internal carotid arteries: Normal. --Anterior cerebral arteries (ACA): Normal. --Middle cerebral arteries (MCA): Normal. ANATOMIC VARIANTS: Fetal origin of the left PCA. IMPRESSION: 1. Truncated and motion degraded examination.   No acute infarct. 2. Area of magnetic susceptibility effects in the right thalamus, corresponding to the hyperdensity seen on the earlier CT. While this may be a small focus of hemorrhage, it is indeterminate and could also indicate a cavernous angioma focus of mineralization. When the patient can tolerate a, a complete MRI with susceptibility weighted imaging might be helpful. 3. 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 CTA of the head can provide better spatial resolution if patient motion could be minimized. 4. 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 Electronically Signed   By: Deatra Robinson M.D.   On: 12/03/2020 20:11    EKG: Independently reviewed.  Atrial fibrillation at 114 beats minute.  Suspected lead reversal based on morphology.  Assessment/Plan Principal Problem:   Acute focal neurological deficit Active Problems:   Persistent atrial fibrillation (HCC)   Hypothyroidism, unspecified   Heart failure, unspecified (HCC)   Benign essential hypertension   Anxiety disorder, unspecified  TIA versus CVA > Patient with focal neurologic deficit of difficulty putting status together and slurred speech and confusion x2 with episode lasting about 1 hour. > CT head with findings of possible small focal hemorrhage in the thalamus and questionable aneurysm. > Recommendation for MRI and MRA to further evaluate if truly hemorrhagic versus ischemic etiology. > Neurology has been consulted in the ED and have yet to fully evaluate patient will do so after her imaging. - Appreciate neurology recommendations -MRI was less convincing for emergent stroke however will await neurology recommendations for determining blood pressure parameters.  - Check A1C , Lipid panel, echocardiogram, CTA Head/Neck  - Tele monitoring  - SLP eval -  PT/OT  Hyperkalemia Hyponatremia > Potassium shown to be 6.2 on i-STAT with sodium of 127 - We will get BMP to confirm sodium and potassium before starting treatment  Atrial fibrillation - Currently on Eliquis this is being held due to possible bleed as above - Continue metoprolol  Hypertension CHF > Last echo May of this year with EF 60-65% with indeterminate diastolic parameters and normal RV function descending aorta mildly dilated at 39 mm.  - Continue home Lasix, metoprolol  Hypothyroidism - Continue home Synthroid  Anxiety - Continue home Xanax  DVT prophylaxis: SCDs  Code Status:   Full, states she will think more on if she want to change this  Family Communication:  Attempted to contact son by phone however I reached a busy tone.  Disposition Plan:   Patient is from:  Home  Anticipated DC  to:  Home  Anticipated DC date:  1 to 3 days  Anticipated DC barriers: None  Consults called:  Neurology, consulted by EDP, awaiting recommendations  Admission status:  Observation, telemetry   Severity of Illness: The appropriate patient status for this patient is OBSERVATION. Observation status is judged to be reasonable and necessary in order to provide the required intensity of service to ensure the patient's safety. The patient's presenting symptoms, physical exam findings, and initial radiographic and laboratory data in the context of their medical condition is felt to place them at decreased risk for further clinical deterioration. Furthermore, it is anticipated that the patient will be medically stable for discharge from the hospital within 2 midnights of admission. The following factors support the patient status of observation.   " The patient's presenting symptoms include slurred speech and altered mental status. " The physical exam findings include stable physical exam other than disoriented to year. " The initial radiographic and laboratory data are Lab work-up showed  i-STAT CHEM panel with sodium 127, potassium 6.2, chloride 92.  CBC with hemoglobin 15.2, PT, INR within normal limits, respiratory flu COVID-negative, urinalysis pending.  CT head showed small focal hemorrhage at thalamus with a questionable 1 cm aneurysm also noted was chronic changes.    Synetta Fail MD [facility]  How to contact the [facility] Attending or Consulting provider 7A - 7P or covering provider during after hours 7P -7A, for this patient?   Check the care team in [facility] and look for a) attending/consulting [facility] provider listed and b) the [facility] team listed Log into [email or web address] and use 's universal password to access. If you do not have the password, please contact the hospital operator. Locate the [facility] provider you are looking for under [facility] and page to a number that you can be directly reached. If you still have difficulty reaching the provider, please page the [facility] (Director on Call) for the Hospitalists listed on amion for assistance.  12/03/2020, 9:12 PM

## 2020-12-04 ENCOUNTER — Observation Stay (HOSPITAL_COMMUNITY): Payer: Medicare Other

## 2020-12-04 DIAGNOSIS — G459 Transient cerebral ischemic attack, unspecified: Secondary | ICD-10-CM

## 2020-12-04 DIAGNOSIS — R29818 Other symptoms and signs involving the nervous system: Secondary | ICD-10-CM | POA: Diagnosis not present

## 2020-12-04 DIAGNOSIS — I610 Nontraumatic intracerebral hemorrhage in hemisphere, subcortical: Secondary | ICD-10-CM

## 2020-12-04 DIAGNOSIS — E785 Hyperlipidemia, unspecified: Secondary | ICD-10-CM | POA: Diagnosis not present

## 2020-12-04 HISTORY — DX: Nontraumatic intracerebral hemorrhage in hemisphere, subcortical: I61.0

## 2020-12-04 LAB — ECHOCARDIOGRAM COMPLETE
Area-P 1/2: 5.14 cm2
MV M vel: 4.94 m/s
MV Peak grad: 97.6 mmHg
Radius: 0.6 cm
S' Lateral: 4.2 cm

## 2020-12-04 LAB — LIPID PANEL
Cholesterol: 154 mg/dL (ref 0–200)
HDL: 46 mg/dL (ref >40)
LDL Cholesterol: 94 mg/dL (ref 0–99)
Total CHOL/HDL Ratio: 3.3 RATIO
Triglycerides: 72 mg/dL (ref <150)
VLDL: 14 mg/dL (ref 0–40)

## 2020-12-04 LAB — HEMOGLOBIN A1C
Hgb A1c MFr Bld: 5.4 % (ref 4.8–5.6)
Mean Plasma Glucose: 108.28 mg/dL

## 2020-12-04 MED ORDER — ZOLPIDEM TARTRATE 5 MG PO TABS: 5.0000 mg | ORAL_TABLET | Freq: Every evening | ORAL | Status: DC | PRN

## 2020-12-04 MED ORDER — FUROSEMIDE 10 MG/ML IJ SOLN
40.0000 mg | Freq: Two times a day (BID) | INTRAMUSCULAR | Status: DC
  Administered 2020-12-04 – 2020-12-05 (×3): 40 mg via INTRAVENOUS
  Filled 2020-12-04 (×3): qty 4

## 2020-12-04 NOTE — Assessment & Plan Note (Signed)
Blood pressure stable.  Allowing permissive hypertension for now.

## 2020-12-04 NOTE — Assessment & Plan Note (Addendum)
Currently rate controlled but intermittently tachycardic on exertion. Limited room to uptitrate meds in the setting of TIA. outpt follow up with Cardiology  Per neurology can resume Eliquis.

## 2020-12-04 NOTE — Progress Notes (Signed)
  Progress Note    Kaitlyn Rodriguez   XNA:[id number]  DOB: 1933-06-14  DOA: 12/03/2020     0 Date of Service: 12/04/2020     Subjective:  Appears to have shortness of breath but no nausea no vomiting but was agitated this morning wanted to go home.    Hospital Problems * Acute focal neurological deficit Stated her small focal hemorrhage of the right colon as per CTA no LVO.  7 mm basal drip aneurysm.  MRI limited but shows evidence of possibility of hemorrhage as well in the right thalamic area. Neurology consulted. Echocardiogram shows EF of 35 to 40%.  Aortic dilation noted as well. LDL 94. Hemoglobin A1c 5.4. Currently on cardiac diet. On Eliquis prior to admission.  Will resume per neurology when indicated.    Thalamic hemorrhage (HCC) Continue Eliquis. Monitor PT OT recommendation.  Persistent atrial fibrillation (HCC) Currently rate controlled but intermittently tachycardic.  Monitor. Per neurology can resume Eliquis.  Anxiety disorder, unspecified Currently Xanax.  Benign essential hypertension Blood pressure stable.  Allowing permissive hypertension for now.  Acute combined systolic and diastolic congestive heart failure (HCC) Echocardiogram shows 35% EF along with diastolic dysfunction.  Also has aortic dilation. Will consult cardiology for further assistance.  Continue IV Lasix for now.  Hypothyroidism, unspecified Continue Synthroid.     Objective Vital signs were reviewed and unremarkable.  Vitals:   12/04/20 1228 12/04/20 1500 12/04/20 1746 12/04/20 1933  BP: 108/76 (!) 138/100  122/89  Pulse: 85  (!) 101 95  Resp: 18 18  19   Temp: 98.9 F (37.2 C) 98.8 F (37.1 C)  98.7 F (37.1 C)  TempSrc: Oral Oral  Oral  SpO2: 95%   94%      Exam General: Appear in mild distress, no Rash; Oral Mucosa Clear, moist. no Abnormal Neck Mass Or lumps, Conjunctiva normal  Cardiovascular: S1 and S2 Present, no Murmur, Respiratory: increased respiratory effort,  Bilateral Air entry present and bilateral  Crackles, no wheezes Abdomen: Bowel Sound present, Soft and no tenderness Extremities: trace Pedal edema Neurology: alert and oriented to time, place, and person affect appropriate. no new focal deficit Gait not checked due to patient safety concerns    Labs / Other Information Abnormal echocardiogram with low EF.    Time spent: 35 mins [facility] 12/04/2020, 7:48 PM

## 2020-12-04 NOTE — Progress Notes (Signed)
OT Cancellation Note  Patient Details Name: Kaitlyn Rodriguez MRN: [id number] DOB: 14-Sep-1933   Cancelled Treatment:    Reason Eval/Treat Not Completed: Other (comment). Speech getting ready to go in to see patient.  Ignacia Palma, OTR/L Acute Rehab Services Pager [phone] Office [phone]    Evette Georges 12/04/2020, 3:11 PM

## 2020-12-04 NOTE — Evaluation (Signed)
Speech Language Pathology Evaluation Patient Details Name: Kaitlyn Rodriguez MRN: [id number] DOB: 01/06/1934 Today's Date: 12/04/2020 Time: 1884-1660 SLP Time Calculation (min) (ACUTE ONLY): 25 min  Problem List:  Patient Active Problem List   Diagnosis Date Noted   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   Acute focal neurological deficit 12/03/2020   Nonrheumatic mitral valve regurgitation 07/27/2020   Nonrheumatic aortic valve insufficiency 07/27/2020   Pulmonary hypertension (HCC) 07/27/2020   Obesity (BMI 30-39.9) 07/27/2020   Thyroid disease    Vitamin B12 deficiency anemia    Unspecified osteoarthritis, unspecified site    Hypothyroidism, unspecified    History of aortic valve replacement    Heart failure, unspecified (HCC)    Dependence on supplemental oxygen    Benign essential hypertension    Anxiety disorder, unspecified    Palliative care by specialist    Goals of care, counseling/discussion    Hypervolemia    Hyponatremia    Persistent atrial fibrillation (HCC)    Pleural effusion    Respiratory failure (HCC)    AMS (altered mental status)    S/P aortic aneurysm repair 12/21/2018   Dissecting aneurysm of thoracic aorta, 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   Hypothyroidism 12/21/2018   Dissection of unspecified site of aorta (HCC) 11/2018   Past Medical History:  Past Medical History:  Diagnosis Date   Anxiety disorder, unspecified    Benign essential hypertension    Dissection of unspecified site of aorta (HCC) 11/2018   Heart failure, unspecified (HCC)    History of aortic valve replacement    Hypothyroidism, unspecified    Unspecified osteoarthritis, unspecified site    Vitamin B12 deficiency anemia    Past Surgical History:  Past Surgical History:  Procedure Laterality Date   ABDOMINAL HYSTERECTOMY     at age 82   REPLACEMENT ASCENDING AORTA N/A 12/20/2018   Procedure: REPAIR OF TYPE A AORTIC DISSECTION USING HEMASHIELD PLATINUM AND GRAFT;  Surgeon:  Linden Dolin, MD;  Location: [facility] OR;  Service: Open Heart Surgery;  Laterality: N/A;   STERNAL CLOSURE  12/20/2018   Procedure: STERNAL PLATING;  Surgeon: Linden Dolin, MD;  Location: [facility] OR;  Service: Open Heart Surgery;;   HPI:  87 y.o. female presents to [facility] ED on 12/03/2020 with 2 episodes of AMS and slurred speech. 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 PMH includes anxiety, hypertension, oxygen dependence, aortic dissection, aortic valve replacement, CHF, hypothyroidism, atrial fibrillation, pleural effusion, pulmonary hypertension.   Assessment / Plan / Recommendation Clinical Impression  Patient presents with moderately imparied cognitive functioning with primary deficits in attention and initiation. Patient is impulsive, does not self-monitor when responding. She was oriented to self and place but stated year as "42" and stated age as "89". She was able to recall 2/5 words after 2 minute delay. She was unable to to fully particiate in majority of formal testing but did seem to give accurate responses when asked biographical information and quesitons about assistance she was getting from son prior to this hospitalization. She would become fixated on certain things such as wanting to use the toilet (she had just been cleaned up by nursing for incontinence; impacted by having to get Lasix). When she requested to brush her teeth, SLP brought her oral care supplies and placed on table in front of her. She was pleased about this but then would not initiate actually brushing her teeth as she then was fixated on finding a particular  program on TV. As patient lives with son who provides supervision and care (medication management, money management, meals, etc), SLP is not recommending further ST services during acute care stay or at next venue of care. She will require continued 24 hour supervision and assistance with all ADL's.    SLP Assessment  SLP  Recommendation/Assessment: Patient does not need any further Speech Lanaguage Pathology Services SLP Visit Diagnosis: Cognitive communication deficit (R41.841)    Recommendations for follow up therapy are one component of a multi-disciplinary discharge planning process, led by the attending physician.  Recommendations may be updated based on patient status, additional functional criteria and insurance authorization.    Follow Up Recommendations  24 hour supervision/assistance;Other (comment)    Frequency and Duration           SLP Evaluation Cognition  Overall Cognitive Status: No family/caregiver present to determine baseline cognitive functioning Arousal/Alertness: Awake/alert Orientation Level: Oriented to person;Oriented to place;Disoriented to time Year: Other (Comment) ("34") Day of Week: Incorrect Attention: Sustained;Focused Focused Attention: Impaired Focused Attention Impairment: Verbal basic;Functional basic Sustained Attention: Impaired Sustained Attention Impairment: Verbal basic;Functional basic Memory: Impaired Memory Impairment: Other (comment);Storage deficit;Decreased recall of new information (recalled 2/5 words after 2 minute delay) Awareness: Impaired Awareness Impairment: Intellectual impairment;Emergent impairment Problem Solving: Impaired Problem Solving Impairment: Functional basic Executive Function: Initiating;Organizing Organizing: Impaired Organizing Impairment: Functional basic Initiating: Impaired Initiating Impairment: Functional basic Behaviors: Impulsive Safety/Judgment: Impaired       Comprehension  Auditory Comprehension Overall Auditory Comprehension: Other (comment) (patient's attention and initiation very poor and unable to participate in much direct testing/assessment) Conversation: Simple Interfering Components: Processing speed;Attention;Working Radio broadcast assistant: Sports administrator: Not tested Reading Comprehension Reading Status: Not tested    Expression Expression Primary Mode of Expression: Verbal Verbal Expression Overall Verbal Expression: Appears within functional limits for tasks assessed   Oral / Motor  Oral Motor/Sensory Function Overall Oral Motor/Sensory Function: Within functional limits Motor Speech Overall Motor Speech: Appears within functional limits for tasks assessed   GO                    Angela Nevin, MA, CCC-SLP Speech Therapy

## 2020-12-04 NOTE — Assessment & Plan Note (Addendum)
Unsure based on the imaging if it hemorrhage or not. Pt unwilling for MRI.  Continue Eliquis.

## 2020-12-04 NOTE — Assessment & Plan Note (Addendum)
Echocardiogram shows 35% EF along with diastolic dysfunction.  Also has aortic dilation. Appreciate cardiology consult. meds adjusted. Pt will follow up outpt with Cardiology  Continue IV Lasix for now.

## 2020-12-04 NOTE — Evaluation (Signed)
Physical Therapy Evaluation Patient Details Name: Kaitlyn Rodriguez MRN: [id number] DOB: 27-Dec-1933 Today's Date: 12/04/2020  History of Present Illness  87 y.o. female presents to [facility] ED on 12/03/2020 with 2 episodes of AMS and slurred speech. 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 PMH includes anxiety, hypertension, oxygen dependence, aortic dissection, aortic valve replacement, CHF, hypothyroidism, atrial fibrillation, pleural effusion, pulmonary hypertension.  Clinical Impression  Pt presents to PT with deficits in balance, strength, power, cognition, gait, power, endurance. Pt demonstrates impairments in attention during session, often speaking during session with little time for PT to interject cueing. Pt demonstrates impaired awareness of safety, attempting to stand multiple times without the use of an assistive device, and abandons walker twice during session when nearing recliner. Pt will benefit from continued aggressive mobilization and assessment of gait with 4 wheeled walker next session. PT recommends discharge home with HHPT with assistance of son for all out of bed mobility.     Recommendations for follow up therapy are one component of a multi-disciplinary discharge planning process, led by the attending physician.  Recommendations may be updated based on patient status, additional functional criteria and insurance authorization.  Follow Up Recommendations Home health PT;Supervision for mobility/OOB    Equipment Recommendations  Other (comment) (TBD, may need RW pending balance with 4 wheeled walker)    Recommendations for Other Services       Precautions / Restrictions Precautions Precautions: Fall Restrictions Weight Bearing Restrictions: No      Mobility  Bed Mobility Overal bed mobility: Needs Assistance Bed Mobility: Supine to Sit     Supine to sit: Supervision          Transfers Overall transfer level: Needs  assistance Equipment used: Rolling walker (2 wheeled) Transfers: Sit to/from Stand Sit to Stand: Min guard;Min assist         General transfer comment: minA from low commode, otherwise minG  Ambulation/Gait Ambulation/Gait assistance: Min assist Gait Distance (Feet): 20 Feet (2 additional trials of 10' to commode) Assistive device: Rolling walker (2 wheeled) Gait Pattern/deviations: Step-through pattern;Wide base of support;Trunk flexed Gait velocity: reduced Gait velocity interpretation: <1.8 ft/sec, indicate of risk for recurrent falls General Gait Details: pt with increased turnk flexion, requires verbal cues to maintain RW closer to BOS  Stairs            Wheelchair Mobility    Modified Rankin (Stroke Patients Only)       Balance Overall balance assessment: Needs assistance Sitting-balance support: No upper extremity supported;Feet supported Sitting balance-Leahy Scale: Fair     Standing balance support: Single extremity supported;Bilateral upper extremity supported Standing balance-Leahy Scale: Poor Standing balance comment: reliant on UE support of walker or railing for stability                             Pertinent Vitals/Pain Pain Assessment: Faces Faces Pain Scale: Hurts little more Pain Location: back Pain Descriptors / Indicators: Aching Pain Intervention(s): Monitored during session    Home Living Family/patient expects to be discharged to:: Private residence Living Arrangements: Children (son) Available Help at Discharge: Family;Available 24 hours/day Type of Home: House Home Access: Stairs to enter Entrance Stairs-Rails: Can reach both Entrance Stairs-Number of Steps: 4 Home Layout: One level Home Equipment: Walker - 4 wheels;Wheelchair - manual;Bedside commode      Prior Function Level of Independence: Needs assistance   Gait / Transfers Assistance Needed:  pt ambulates with use of rollator, although she reports she also  often walks without use of a device. Pt only tolerates short bouts of physical activity due to fatigue and back pain.  ADL's / Homemaking Assistance Needed: son performs all IADLs        Hand Dominance        Extremity/Trunk Assessment   Upper Extremity Assessment Upper Extremity Assessment: Overall WFL for tasks assessed    Lower Extremity Assessment Lower Extremity Assessment: Generalized weakness    Cervical / Trunk Assessment Cervical / Trunk Assessment: Kyphotic  Communication   Communication: No difficulties (pt is loquacious)  Cognition Arousal/Alertness: Awake/alert Behavior During Therapy: Impulsive Overall Cognitive Status: Impaired/Different from baseline Area of Impairment: Safety/judgement;Awareness;Attention                   Current Attention Level: Sustained     Safety/Judgement: Decreased awareness of safety;Decreased awareness of deficits Awareness: Emergent          General Comments General comments (skin integrity, edema, etc.): tachycardia into 150s with short bouts of mobility    Exercises     Assessment/Plan    PT Assessment Patient needs continued PT services  PT Problem List Decreased strength;Decreased activity tolerance;Decreased balance;Decreased mobility;Decreased knowledge of use of DME;Decreased safety awareness;Decreased knowledge of precautions;Cardiopulmonary status limiting activity;Pain       PT Treatment Interventions DME instruction;Gait training;Functional mobility training;Stair training;Therapeutic activities;Therapeutic exercise;Balance training;Neuromuscular re-education;Patient/family education    PT Goals (Current goals can be found in the Care Plan section)  Acute Rehab PT Goals Patient Stated Goal: to go home PT Goal Formulation: With patient Time For Goal Achievement: 12/18/20 Potential to Achieve Goals: Good    Frequency Min 4X/week   Barriers to discharge        Co-evaluation                AM-PAC PT "6 Clicks" Mobility  Outcome Measure Help needed turning from your back to your side while in a flat bed without using bedrails?: A Little Help needed moving from lying on your back to sitting on the side of a flat bed without using bedrails?: A Little Help needed moving to and from a bed to a chair (including a wheelchair)?: A Little Help needed standing up from a chair using your arms (e.g., wheelchair or bedside chair)?: A Little Help needed to walk in hospital room?: A Little Help needed climbing 3-5 steps with a railing? : A Little 6 Click Score: 18    End of Session   Activity Tolerance: Patient limited by fatigue Patient left: in chair;with call bell/phone within reach;with chair alarm set;with family/visitor present Nurse Communication: Mobility status PT Visit Diagnosis: Other abnormalities of gait and mobility (R26.89);Muscle weakness (generalized) (M62.81);Pain Pain - part of body:  (back)    Time: 9702-6378 PT Time Calculation (min) (ACUTE ONLY): 30 min   Charges:   PT Evaluation $PT Eval Low Complexity: 1 Low          Arlyss Gandy, PT, DPT Acute Rehabilitation Pager: [phone]   Arlyss Gandy 12/04/2020, 3:31 PM

## 2020-12-04 NOTE — Progress Notes (Signed)
  Echocardiogram 2D Echocardiogram has been performed.  Leta Jungling M 12/04/2020, 9:00 AM

## 2020-12-04 NOTE — Plan of Care (Signed)
  Problem: Education: Goal: Knowledge of General Education information will improve Description: Including pain rating scale, medication(s)/side effects and non-pharmacologic comfort measures Outcome: Progressing   Problem: Health Behavior/Discharge Planning: Goal: Ability to manage health-related needs will improve Outcome: Progressing   Problem: Clinical Measurements: Goal: Ability to maintain clinical measurements within normal limits will improve Outcome: Progressing Goal: Will remain free from infection Outcome: Progressing Goal: Diagnostic test results will improve Outcome: Progressing Goal: Respiratory complications will improve Outcome: Progressing Goal: Cardiovascular complication will be avoided Outcome: Progressing   Problem: Activity: Goal: Risk for activity intolerance will decrease Outcome: Progressing   Problem: Nutrition: Goal: Adequate nutrition will be maintained Outcome: Progressing   Problem: Coping: Goal: Level of anxiety will decrease Outcome: Progressing   Problem: Elimination: Goal: Will not experience complications related to bowel motility Outcome: Progressing Goal: Will not experience complications related to urinary retention Outcome: Progressing   Problem: Pain Managment: Goal: General experience of comfort will improve Outcome: Progressing   Problem: Safety: Goal: Ability to remain free from injury will improve Outcome: Progressing   Problem: Skin Integrity: Goal: Risk for impaired skin integrity will decrease Outcome: Progressing   Problem: Education: Goal: Knowledge of secondary prevention will improve Outcome: Progressing Goal: Knowledge of patient specific risk factors addressed and post discharge goals established will improve Outcome: Progressing Goal: Individualized Educational Video(s) Outcome: Progressing   

## 2020-12-04 NOTE — Progress Notes (Addendum)
STROKE TEAM PROGRESS NOTE   INTERVAL HISTORY No visitors at bedside. Bedside RN reports patient is only focused on leaving the hospital as soon as possible.  Today patient is feeling much better and reports her speech is mostly back to normal although she does occasionally feel like she can't get a word out.  She believes the episodes of aphasia may have been related to a sleep medication but is unable to name the medication. ?Xanax. However, chart review indicates these episodes happened during the day time.   She was unable to tolerate MRI due to back pain/inability to lie still.  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 She reported compliance with her Eliquis at home (recent cardiology note 9/26 with Dr. Servando Salina notes medication and follow up non-compliance). She stated understanding and agreed to continuing Eliquis.  She is adamant that she wishes to go home as soon as possible and and does not want to try to get through any further imaging. She admits to being up all night worrying that we might ask her to try.   We attempted to call her son but he did not answer.   Vitals:   12/04/20 0351 12/04/20 0555 12/04/20 0655 12/04/20 1228  BP: (!) 73/61 99/73 119/79 108/76  Pulse: 84  70 85  Resp: 15  20 18   Temp: 98.4 F (36.9 C) 98.2 F (36.8 C) 98 F (36.7 C) 98.9 F (37.2 C)  TempSrc: Axillary Oral Oral Oral  SpO2: (!) 87%  98% 95%   CBC:  Recent Labs  Lab 12/03/20 1214 12/03/20 1734  WBC 8.9  --   NEUTROABS 7.2  --   HGB 15.2* 14.6  HCT 46.5* 43.0  MCV 96.7  --   PLT 212  --    Basic Metabolic Panel:  Recent Labs  Lab 12/03/20 1734 12/03/20 2110  NA 127* 129*  K 6.2* 4.2  CL 92* 93*  CO2  --  29  GLUCOSE 97 105*  BUN 18 10  CREATININE 0.80 0.74  CALCIUM  --  9.1   Lipid Panel:  Recent Labs  Lab 12/04/20 0422  CHOL 154  TRIG 72  HDL 46   CHOLHDL 3.3  VLDL 14  LDLCALC 94   HgbA1c:  Recent Labs  Lab 12/04/20 0422  HGBA1C 5.4   Urine Drug Screen: No results for input(s): LABOPIA, COCAINSCRNUR, LABBENZ, AMPHETMU, THCU, LABBARB in the last 168 hours.  Alcohol Level No results for input(s): ETH in the last 168 hours.  IMAGING past 24 hours CT ANGIO HEAD W OR WO CONTRAST  Result Date: 12/03/2020 CLINICAL DATA:  Aphasia. Possible basilar aneurysm. Concern for intracranial hemorrhage. EXAM: CT ANGIOGRAPHY HEAD AND NECK TECHNIQUE: Multidetector CT imaging of the head and neck was performed using the standard protocol during bolus administration of intravenous contrast. Multiplanar CT image reconstructions and MIPs were obtained to evaluate the vascular anatomy. Carotid stenosis measurements (when applicable) are obtained utilizing NASCET criteria, using the distal internal carotid diameter as the denominator. CONTRAST:  02/02/2021 OMNIPAQUE IOHEXOL 350 MG/ML SOLN COMPARISON:  Head CT 12/03/2020 Brain MRI/MRA 12/03/2020 FINDINGS: CT HEAD FINDINGS Brain: Mild hyperdensity within the ventral lateral right thalamus is unchanged. There is generalized atrophy without lobar predilection. There is hypoattenuation of the periventricular white matter, most commonly indicating chronic ischemic microangiopathy. Skull: The visualized skull base, calvarium and extracranial soft tissues are normal. Sinuses/Orbits: No  fluid levels or advanced mucosal thickening of the visualized paranasal sinuses. No mastoid or middle ear effusion. The orbits are normal. CTA NECK FINDINGS SKELETON: There is no bony spinal canal stenosis. No lytic or blastic lesion. OTHER NECK: Normal pharynx, larynx and major salivary glands. No cervical lymphadenopathy. Unremarkable thyroid gland. UPPER CHEST: Biapical ground-glass opacity, likely pulmonary edema. AORTIC ARCH: There is calcific atherosclerosis of the aortic arch. The ascending aorta is dilated, measuring 5.3 cm. There are areas  of peripheral luminal irregularity of the ascending aortic arch that are not completely visualized. There has been a remote ascending aortic dissection repair. RIGHT CAROTID SYSTEM: Normal without aneurysm, dissection or stenosis. LEFT CAROTID SYSTEM: Normal without aneurysm, dissection or stenosis. VERTEBRAL ARTERIES: Left dominant configuration. Both origins are clearly patent. There is no dissection, occlusion or flow-limiting stenosis to the skull base (V1-V3 segments). CTA HEAD FINDINGS POSTERIOR CIRCULATION: --Vertebral arteries: Normal V4 segments. --Inferior cerebellar arteries: Normal. --Basilar artery: There is dolichoectasia of the basilar artery with the basilar tip in the superior location impressing upon the floor of the third ventricle. 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 The more proximal basilar diameter is 5 mm. --Superior cerebellar arteries: Normal. --Posterior cerebral arteries (PCA): Normal. The occluded appearance of the right PCA on the earlier MRI must have been artifactual. ANTERIOR CIRCULATION: --Intracranial internal carotid arteries: Normal. --Anterior cerebral arteries (ACA): Normal. Both A1 segments are present. Patent anterior communicating artery (a-comm). --Middle cerebral arteries (MCA): Normal. VENOUS SINUSES: As permitted by contrast timing, patent. ANATOMIC VARIANTS: None Review of the MIP images confirms the above findings. IMPRESSION: 1. No intracranial arterial occlusion or high-grade stenosis. 2. 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 3. Unchanged appearance of small focus of hyperdensity within the right thalamus. This is less likely to be acute hemorrhage given the lack of edema and the relatively low density compared to the typical appearance of acute blood. Possibilities is include cavernous angioma, mineralization or subacute blood. MRI without contrast may be helpful if the patient can tolerate the full examination. 4.  Status post ascending aortic dissection repair, incompletely visualized on this study. The ascending aortic diameter measures 5.3 cm and there are areas of luminal irregularity that are indeterminate without prior studies for comparison. 5. Biapical ground-glass opacity, likely pulmonary edema. Aortic Atherosclerosis (ICD10-I70.0). Electronically Signed   By: Deatra Robinson M.D.   On: 12/03/2020 22:14   CT ANGIO NECK W OR WO CONTRAST  Result Date: 12/03/2020 CLINICAL DATA:  Aphasia. Possible basilar aneurysm. Concern for intracranial hemorrhage. EXAM: CT ANGIOGRAPHY HEAD AND NECK TECHNIQUE: Multidetector CT imaging of the head and neck was performed using the standard protocol during bolus administration of intravenous contrast. Multiplanar CT image reconstructions and MIPs were obtained to evaluate the vascular anatomy. Carotid stenosis measurements (when applicable) are obtained utilizing NASCET criteria, using the distal internal carotid diameter as the denominator. CONTRAST:  OMNIPAQUE IOHEXOL 350 MG/ML SOLN COMPARISON:  Head CT 12/03/2020 Brain MRI/MRA 12/03/2020 FINDINGS: CT HEAD FINDINGS Brain: Mild hyperdensity within the ventral lateral right thalamus is unchanged. There is generalized atrophy without lobar predilection. There is hypoattenuation of the periventricular white matter, most commonly indicating chronic ischemic microangiopathy. Skull: The visualized skull base, calvarium and extracranial soft tissues are normal. Sinuses/Orbits: No fluid levels or advanced mucosal thickening of the visualized paranasal sinuses. No mastoid or middle ear effusion. The orbits are normal. CTA NECK FINDINGS SKELETON: There is no bony spinal canal stenosis. No lytic or blastic  lesion. OTHER NECK: Normal pharynx, larynx and major salivary glands. No cervical lymphadenopathy. Unremarkable thyroid gland. UPPER CHEST: Biapical ground-glass opacity, likely pulmonary edema. AORTIC ARCH: There is calcific  atherosclerosis of the aortic arch. The ascending aorta is dilated, measuring 5.3 cm. There are areas of peripheral luminal irregularity of the ascending aortic arch that are not completely visualized. There has been a remote ascending aortic dissection repair. RIGHT CAROTID SYSTEM: Normal without aneurysm, dissection or stenosis. LEFT CAROTID SYSTEM: Normal without aneurysm, dissection or stenosis. VERTEBRAL ARTERIES: Left dominant configuration. Both origins are clearly patent. There is no dissection, occlusion or flow-limiting stenosis to the skull base (V1-V3 segments). CTA HEAD FINDINGS POSTERIOR CIRCULATION: --Vertebral arteries: Normal V4 segments. --Inferior cerebellar arteries: Normal. --Basilar artery: There is dolichoectasia of the basilar artery with the basilar tip in the superior location impressing upon the floor of the third ventricle. 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 The more proximal basilar diameter is 5 mm. --Superior cerebellar arteries: Normal. --Posterior cerebral arteries (PCA): Normal. The occluded appearance of the right PCA on the earlier MRI must have been artifactual. ANTERIOR CIRCULATION: --Intracranial internal carotid arteries: Normal. --Anterior cerebral arteries (ACA): Normal. Both A1 segments are present. Patent anterior communicating artery (a-comm). --Middle cerebral arteries (MCA): Normal. VENOUS SINUSES: As permitted by contrast timing, patent. ANATOMIC VARIANTS: None Review of the MIP images confirms the above findings. IMPRESSION: 1. No intracranial arterial occlusion or high-grade stenosis. 2. 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 3. Unchanged appearance of small focus of hyperdensity within the right thalamus. This is less likely to be acute hemorrhage given the lack of edema and the relatively low density compared to the typical appearance of acute blood. Possibilities is include cavernous angioma, mineralization or  subacute blood. MRI without contrast may be helpful if the patient can tolerate the full examination. 4. Status post ascending aortic dissection repair, incompletely visualized on this study. The ascending aortic diameter measures 5.3 cm and there are areas of luminal irregularity that are indeterminate without prior studies for comparison. 5. Biapical ground-glass opacity, likely pulmonary edema. Aortic Atherosclerosis (ICD10-I70.0). Electronically Signed   By: Deatra Robinson M.D.   On: 12/03/2020 22:14   MR ANGIO HEAD WO CONTRAST  Result Date: 12/03/2020 CLINICAL DATA:  Acute neurologic deficit EXAM: MRI HEAD WITHOUT CONTRAST MRA HEAD WITHOUT CONTRAST TECHNIQUE: Multiplanar, multi-echo pulse sequences of the brain and surrounding structures were acquired without intravenous contrast. Angiographic images of the Circle of Willis were acquired using MRA technique without intravenous contrast. COMPARISON:  No pertinent prior exam. FINDINGS: MRI HEAD FINDINGS Limited brain MRI protocol was utilized, consisting only of axial diffusion weighted images. These images show no acute infarct. There is magnetic susceptibility effects at the site of the hyperdensity in the right thalamus. MRA HEAD FINDINGS MRA images are motion degraded. POSTERIOR CIRCULATION: --Vertebral arteries: Normal --Inferior cerebellar arteries: Normal. --Basilar artery: Dilated and ectatic distal basilar artery with diameter of 6 mm. --Superior cerebellar arteries: Normal. --Posterior cerebral arteries: Loss of normal flow related enhancement in the right posterior cerebral artery. Fetal origin of the left. ANTERIOR CIRCULATION: --Intracranial internal carotid arteries: Normal. --Anterior cerebral arteries (ACA): Normal. --Middle cerebral arteries (MCA): Normal. ANATOMIC VARIANTS: Fetal origin of the left PCA. IMPRESSION: 1. Truncated and motion degraded examination.  No acute infarct. 2. Area of magnetic susceptibility effects in the right  thalamus, corresponding to the hyperdensity seen on the earlier CT. While this may be a small focus of hemorrhage, it  is indeterminate and could also indicate a cavernous angioma focus of mineralization. When the patient can tolerate a, a complete MRI with susceptibility weighted imaging might be helpful. 3. 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 CTA of the head can provide better spatial resolution if patient motion could be minimized. 4. 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 Electronically Signed   By: Deatra Robinson M.D.   On: 12/03/2020 20:11   MR BRAIN WO CONTRAST  Result Date: 12/03/2020 CLINICAL DATA:  Acute neurologic deficit EXAM: MRI HEAD WITHOUT CONTRAST MRA HEAD WITHOUT CONTRAST TECHNIQUE: Multiplanar, multi-echo pulse sequences of the brain and surrounding structures were acquired without intravenous contrast. Angiographic images of the Circle of Willis were acquired using MRA technique without intravenous contrast. COMPARISON:  No pertinent prior exam. FINDINGS: MRI HEAD FINDINGS Limited brain MRI protocol was utilized, consisting only of axial diffusion weighted images. These images show no acute infarct. There is magnetic susceptibility effects at the site of the hyperdensity in the right thalamus. MRA HEAD FINDINGS MRA images are motion degraded. POSTERIOR CIRCULATION: --Vertebral arteries: Normal --Inferior cerebellar arteries: Normal. --Basilar artery: Dilated and ectatic distal basilar artery with diameter of 6 mm. --Superior cerebellar arteries: Normal. --Posterior cerebral arteries: Loss of normal flow related enhancement in the right posterior cerebral artery. Fetal origin of the left. ANTERIOR CIRCULATION: --Intracranial internal carotid arteries: Normal. --Anterior cerebral arteries (ACA): Normal. --Middle cerebral arteries (MCA): Normal.  ANATOMIC VARIANTS: Fetal origin of the left PCA. IMPRESSION: 1. Truncated and motion degraded examination.  No acute infarct. 2. Area of magnetic susceptibility effects in the right thalamus, corresponding to the hyperdensity seen on the earlier CT. While this may be a small focus of hemorrhage, it is indeterminate and could also indicate a cavernous angioma focus of mineralization. When the patient can tolerate a, a complete MRI with susceptibility weighted imaging might be helpful. 3. 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 CTA of the head can provide better spatial resolution if patient motion could be minimized. 4. 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 Electronically Signed   By: Deatra Robinson M.D.   On: 12/03/2020 20:11   DG CHEST PORT 1 VIEW  Result Date: 12/04/2020 CLINICAL DATA:  Shortness of breath, history of aortic dissection and AVR, heart failure, persistent atrial fibrillation, hypertension EXAM: PORTABLE CHEST 1 VIEW COMPARISON:  Portable exam 1107 hours compared to 02/16/2019 FINDINGS: Enlargement of cardiac silhouette post median sternotomy. Atherosclerotic calcification and tortuosity of thoracic aorta. Questionable hiatal hernia. Chronic accentuation of interstitial markings similar to 01/04/2019. Improved basilar infiltrates and effusion since previous exam. Linear scarring RIGHT base. No acute infiltrate, pleural effusion, or pneumothorax. Bones demineralized. IMPRESSION: Enlargement of cardiac silhouette post median sternotomy. Atherosclerotic calcification and tortuosity of thoracic aorta. Improved bibasilar pulmonary infiltrates and pleural effusions with underlying chronic lung changes. Electronically Signed   By: Ulyses Southward M.D.   On: 12/04/2020 11:27   ECHOCARDIOGRAM COMPLETE  Result Date: 12/04/2020    ECHOCARDIOGRAM REPORT   Patient  Name:   Kaitlyn Rodriguez Date of Exam: 12/04/2020 Medical Rec #:  [id number]     Height:       67.0 in Accession #:    [id number]    Weight:  206.2 lb Date of Birth:  05-14-1933     BSA:          2.049 m Patient Age:    87 years      BP:           119/79 mmHg Patient Gender: F             HR:           70 bpm. Exam Location:  Inpatient Procedure: 2D Echo, Cardiac Doppler and Color Doppler Indications:    TIA G45.9  History:        Patient has prior history of Echocardiogram examinations, most                 recent 07/13/2020. TIA. 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  Sonographer:    Leta Jungling RDCS Referring Phys: [id number] Cecille Po [facility]  Sonographer Comments: Suboptimal apical window and suboptimal parasternal window. Imaging difficult due to restricted mobility. Patient declined the use of defintiy. IMPRESSIONS  1. Left ventricular ejection fraction, by estimation, is 35 to 40%. The left ventricle has moderately decreased function. The left ventricle has no regional wall motion abnormalities. Left ventricular diastolic parameters are indeterminate.  2. Right ventricular systolic function is moderately reduced. The right ventricular size is mildly enlarged. There is mildly elevated pulmonary artery systolic pressure. The estimated right ventricular systolic pressure is 36.5 mmHg.  3. Left atrial size was severely dilated.  4. Right atrial size was severely dilated.  5. The mitral valve is normal in structure. Severe mitral valve regurgitation. No evidence of mitral stenosis.  6. Tricuspid valve regurgitation is moderate.  7. The aortic valve is calcified. Aortic valve regurgitation is mild. Mild to moderate aortic valve sclerosis/calcification is present, without any evidence of aortic stenosis.  8. Pulmonic valve regurgitation  is moderate.  9. Aortic dilatation noted and root/ascending aorta has been repaired/replaced. There is mild dilatation of the ascending aorta, measuring 40 mm. 10. The inferior vena cava is normal in size with greater than 50% respiratory variability, suggesting right atrial pressure of 3 mmHg. FINDINGS  Left Ventricle: Left ventricular ejection fraction, by estimation, is 35 to 40%. The left ventricle has moderately decreased function. The left ventricle has no regional wall motion abnormalities. The left ventricular internal cavity size was normal in size. There is no left ventricular hypertrophy. Left ventricular diastolic parameters are indeterminate.  LV Wall Scoring: The mid and distal inferior wall and mid inferolateral segment are akinetic. Right Ventricle: The right ventricular size is mildly enlarged. No increase in right ventricular wall thickness. Right ventricular systolic function is moderately reduced. There is mildly elevated pulmonary artery systolic pressure. The tricuspid regurgitant velocity is 2.32 m/s, and with an assumed right atrial pressure of 15 mmHg, the estimated right ventricular systolic pressure is 36.5 mmHg. Left Atrium: Left atrial size was severely dilated. Right Atrium: Right atrial size was severely dilated. Pericardium: There is no evidence of pericardial effusion. Mitral Valve: The mitral valve is normal in structure. Severe mitral valve regurgitation, with posteriorly-directed jet. No evidence of mitral valve stenosis. Tricuspid Valve: The tricuspid valve is normal in structure. Tricuspid valve  regurgitation is moderate . No evidence of tricuspid stenosis. Aortic Valve: The aortic valve is calcified. Aortic valve regurgitation is mild. Mild to moderate aortic valve sclerosis/calcification is present, without any evidence of aortic stenosis. Pulmonic Valve: The pulmonic valve was normal in structure. Pulmonic valve regurgitation is moderate. No evidence of pulmonic stenosis.  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 There is mild dilatation of the ascending aorta, measuring 40 mm. Venous: The inferior vena cava is normal in size with greater than 50% respiratory variability, suggesting right atrial pressure of 3 mmHg. IAS/Shunts: No atrial level shunt detected by color flow Doppler.  LEFT VENTRICLE PLAX 2D LVIDd:         5.10 cm   Diastology LVIDs:         4.20 cm   LV e' medial:    6.41 cm/s LV PW:         1.10 cm   LV E/e' medial:  18.4 LV IVS:        1.00 cm   LV e' lateral:   13.15 cm/s LVOT diam:     2.60 cm   LV E/e' lateral: 9.0 LV SV:         57 LV SV Index:   28 LVOT Area:     5.31 cm  RIGHT VENTRICLE TAPSE (M-mode): 1.1 cm LEFT ATRIUM             Index        RIGHT ATRIUM           Index LA diam:        5.80 cm 2.83 cm/m   RA Area:     30.00 cm LA Vol (A2C):   95.0 ml 46.37 ml/m  RA Volume:   83.90 ml  40.95 ml/m LA Vol (A4C):   95.2 ml 46.47 ml/m LA Biplane Vol: 98.1 ml 47.88 ml/m  AORTIC VALVE LVOT Vmax:   77.70 cm/s LVOT Vmean:  52.000 cm/s LVOT VTI:    0.108 m  AORTA Ao Root diam: 3.80 cm Ao Asc diam:  4.00 cm MITRAL VALVE                  TRICUSPID VALVE MV Area (PHT): 5.14 cm       TR Peak grad:   21.5 mmHg MV Decel Time: 148 msec       TR Vmax:        232.00 cm/s MR Peak grad:    97.6 mmHg MR Mean grad:    73.0 mmHg    SHUNTS MR Vmax:         494.00 cm/s  Systemic VTI:  0.11 m MR Vmean:        414.0 cm/s   Systemic Diam: 2.60 cm MR PISA:         2.26 cm MR PISA Eff ROA: 18 mm MR PISA Radius:  0.60 cm MV E velocity: 118.00 cm/s Donato Schultz MD Electronically signed by Donato Schultz MD Signature Date/Time: 12/04/2020/1:02:02 PM    Final     PHYSICAL EXAM Physical Exam  HEENT-  [place]/AT    Lungs- Respirations unlabored Extremities- Mild pretibial pitting edema   Neurological Examination Mental Status: Awake and alert. Able to follow all simple commands.  Has some difficulty but with coaching is able to  complete multi-step exam. Speech is mostly fluent with infrequent Repetition was intact. Able to name common objects.  Rare phonemic paraphasias noted, worst when asked to repeat  a phrase. Oriented to situation, state, day of the week and year, but not the month or the city.  Cranial Nerves: II: Temporal visual fields intact with no extinction to DSS. PERRL.   III,IV, VI: No ptosis. EOMI without nystagmus.  V,VII: No facial droop VIII: hearing intact to voice IX,X: No hypophonia XI: Symmetric XII: Midline tongue extension Motor: RUE and RLE 5/5 LUE 5/5 LLE 5/5  No pronator drift. Sensory: Tem and light touch sensation intact throughout, bilaterally. No extinction to DSS.  Deep Tendon Reflexes: 2+ bilateral brachioradialis. Trace patellar reflexes. 0 achilles bilaterally.  Plantars: Right upgoing toe. Left toe mute.  Cerebellar: No ataxia with FNF bilaterally Gait: Deferred   ASSESSMENT/PLAN Kaitlyn Rodriguez is an 87 y.o. female with a PMHx of anxiety disorder, HTN, type A aortic dissection s/p repair (2020), heart failure (unspecified), aortic valve replacement, atrial fibrillation (on apixaban), hypothyroidism, osteoarthritis and vitamin B12 deficiency anemia, who initially presented to urgent care today with stated concern that she might be having a stroke. Her family member reports that patient has had 2 episodes of slurred speech and altered mental status that started Friday at around 1 AM, the first occurring at 1 AM and the second episode occurring Saturday morning at approximately 8 AM.  The patient's family reported that these episodes lasted approximately 1 hour each and that during them, the patient was unable to piece together sentences.   Stroke like transient episodes x2 lasting approximately one hour consisting of dysphasia with mild receptive and expressive aphasia, LLE weakness proximally with foot drop distally with a likely etiology of TIA  1. CT head CT head: Small focal  hemorrhage in the right thalamus. A small calcification is seen slightly anterior to this. There is a possible 1 cm aneurysm at the tip of the basilar artery. Also noted is chronic diffuse atrophy and chronic bilateral periventricular white matter small vessel ischemic change. CTA of head and neck: 1. No intracranial arterial occlusion or high-grade stenosis. 2. 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 3. Unchanged appearance of small focus of hyperdensity within the right thalamus. This is less likely to be acute hemorrhage given the lack of edema and the relatively low density compared to the typical appearance of acute blood. Possibilities include cavernous angioma, mineralization or subacute blood. MRI without contrast may be helpful if the patient can tolerate the full examination. 4. Status post ascending aortic dissection repair, incompletely visualized on this study. The ascending aortic diameter measures 5.3 cm and there are areas of luminal irregularity that are indeterminate without prior studies for comparison. 5. Biapical ground-glass opacity, likely pulmonary edema.  MRI /MRA 1. Truncated and motion degraded examination.  No acute infarct. 2. Area of magnetic susceptibility effects in the right thalamus, corresponding to the hyperdensity seen on the earlier CT. While thismay be a small focus of hemorrhage, it is indeterminate and could also indicate a cavernous angioma focus of mineralization. When the patient can tolerate a, a complete MRI with susceptibility weighted imaging might be helpful. 3. 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 CTA of the head can provide better spatial resolution if patient motion could be minimized. 4. 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  2D Echo  Left ventricular ejection fraction,  by estimation, is 35 to 40%. The  left ventricle has moderately decreased function. The left ventricle  has no regional wall motion abnormalities. Left ventricular diastolic  parameters are indeterminate.   2. Right ventricular systolic function is moderately reduced. The right ventricular size is mildly enlarged. There is mildly elevated pulmonary artery systolic pressure. The estimated right ventricular systolic pressure is 36.5 mmHg.   3. Left atrial size was severely dilated.   4. Right atrial size was severely dilated.   5. The mitral valve is normal in structure. Severe mitral valve  regurgitation. No evidence of mitral stenosis.   6. Tricuspid valve regurgitation is moderate.   7. The aortic valve is calcified. Aortic valve regurgitation is mild.  Mild to moderate aortic valve sclerosis/calcification is present, without  any evidence of aortic stenosis.   8. Pulmonic valve regurgitation is moderate.   9. Aortic dilatation noted and root/ascending aorta has been  repaired/replaced. There is mild dilatation of the ascending aorta, measuring 40 mm.  10. The inferior vena cava is normal in size with greater than 50%  respiratory variability, suggesting right atrial pressure of 3 mmHg.  LDL 94 HgbA1c 5.4 VTE prophylaxis - on Eliquis     Diet   Diet heart healthy/carb modified Room service appropriate? 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 Ideally MRI should be re-attempted.  Can seek outpatient neurosurgery referral reassess willingness tomorrow              Fluid overload/CHF EF 35-40%  Per discussion with Dr. she should stay for further management, we encouraged this. Management per primary team   Other Stroke Risk Factors Advanced Age >/= 32  Obesity, There is no height or weight on file to calculate BMI., BMI >/= 30 associated with increased stroke risk, recommend weight loss, diet and exercise as appropriate  Congestive heart failure S/p aortic aneurysm repair 2020  Other Active Problems   Hospital day # 0  This patient was seen and evaluated with Dr. 2021. He directed the plan of care.  Delila Bailey-Modzik, NP-C   ATTENDING ATTESTATION:  This the patient with h/o aortic dissection repair about a year ago, on eliquis for afib had a transient episode of dysarthria and is now back to baseline.  She has had episodes such as this previously especially after taking a sleeping medication which she cannot recall the name.  Initial head CT was concerning for possible hemorrhage.  MRI was incomplete as the patient cannot lay flat however does not appear to be a bleed and likely possible cavernous angioma versus calcification so there is no significant edema.  Patient is absolutely refusing repeat MRI.  She again is back to her baseline and wants to go home.  We will hold off from further MRI testing at the moment.  We attempted to contact son but could not reach him.  Continue to hold anticoagulation for now but likely could resume tomorrow in my opinion as a risk of bleeding outweigh the  benefits of stroke prevention.  I discussed this with her in detail the risk of being on anticoagulation and off given her basilar aneurysm.  Continue stroke work-up for now and reevaluate tomorrow  I evaluated pt independently, reviewed imaging, chart, labs. Discussed and formulated plan with the  APP. Please see APP note above for details.     This patient is critically ill due to respiratory distress, stroke and at significant risk of neurological worsening, death form heart failure, respiratory failure, recurrent stroke, bleeding from [facility], seizure, sepsis. This patient's care requires constant monitoring of vital signs, hemodynamics, respiratory and cardiac monitoring, review of multiple databases, neurological assessment, discussion with family, other specialists and medical decision making of high complexity. I spent 35 minutes of neurocritical care time in the care of this patient.   Kedra Mcglade,MD   To contact Stroke Continuity provider, please refer to [email or web address]. After hours, contact General Neurology

## 2020-12-04 NOTE — Assessment & Plan Note (Addendum)
Stated her small focal hemorrhage of the right colon as per CTA no LVO.  7 mm basal drip aneurysm.  MRI limited but shows evidence of possibility of hemorrhage as well in the right thalamic area. Neurology consulted. Echocardiogram shows EF of 35 to 40%.  Aortic dilation noted as well. LDL 94. Hemoglobin A1c 5.4. Currently on cardiac diet. On Eliquis prior to admission.  Will resume per neurology

## 2020-12-04 NOTE — Assessment & Plan Note (Signed)
Continue Synthroid °

## 2020-12-04 NOTE — Assessment & Plan Note (Addendum)
Currently Xanax.  Son at bedside also reports possibility of depression.  Wellbutrin will be added.

## 2020-12-05 ENCOUNTER — Encounter (HOSPITAL_COMMUNITY): Payer: Self-pay | Admitting: Internal Medicine

## 2020-12-05 ENCOUNTER — Inpatient Hospital Stay (HOSPITAL_COMMUNITY): Payer: Medicare Other

## 2020-12-05 DIAGNOSIS — Z8679 Personal history of other diseases of the circulatory system: Secondary | ICD-10-CM | POA: Diagnosis not present

## 2020-12-05 DIAGNOSIS — E039 Hypothyroidism, unspecified: Secondary | ICD-10-CM | POA: Diagnosis present

## 2020-12-05 DIAGNOSIS — I5041 Acute combined systolic (congestive) and diastolic (congestive) heart failure: Secondary | ICD-10-CM | POA: Diagnosis present

## 2020-12-05 DIAGNOSIS — Z79890 Hormone replacement therapy (postmenopausal): Secondary | ICD-10-CM | POA: Diagnosis not present

## 2020-12-05 DIAGNOSIS — I725 Aneurysm of other precerebral arteries: Secondary | ICD-10-CM | POA: Diagnosis present

## 2020-12-05 DIAGNOSIS — Z90710 Acquired absence of both cervix and uterus: Secondary | ICD-10-CM | POA: Diagnosis not present

## 2020-12-05 DIAGNOSIS — Z20822 Contact with and (suspected) exposure to covid-19: Secondary | ICD-10-CM | POA: Diagnosis present

## 2020-12-05 DIAGNOSIS — I4819 Other persistent atrial fibrillation: Secondary | ICD-10-CM | POA: Diagnosis not present

## 2020-12-05 DIAGNOSIS — I272 Pulmonary hypertension, unspecified: Secondary | ICD-10-CM | POA: Diagnosis present

## 2020-12-05 DIAGNOSIS — E785 Hyperlipidemia, unspecified: Secondary | ICD-10-CM | POA: Diagnosis present

## 2020-12-05 DIAGNOSIS — Z9981 Dependence on supplemental oxygen: Secondary | ICD-10-CM | POA: Diagnosis not present

## 2020-12-05 DIAGNOSIS — I4821 Permanent atrial fibrillation: Secondary | ICD-10-CM | POA: Diagnosis present

## 2020-12-05 DIAGNOSIS — Z79899 Other long term (current) drug therapy: Secondary | ICD-10-CM | POA: Diagnosis not present

## 2020-12-05 DIAGNOSIS — E871 Hypo-osmolality and hyponatremia: Secondary | ICD-10-CM | POA: Diagnosis present

## 2020-12-05 DIAGNOSIS — I5020 Unspecified systolic (congestive) heart failure: Secondary | ICD-10-CM | POA: Diagnosis not present

## 2020-12-05 DIAGNOSIS — R4702 Dysphasia: Secondary | ICD-10-CM | POA: Diagnosis present

## 2020-12-05 DIAGNOSIS — E669 Obesity, unspecified: Secondary | ICD-10-CM | POA: Diagnosis present

## 2020-12-05 DIAGNOSIS — F419 Anxiety disorder, unspecified: Secondary | ICD-10-CM | POA: Diagnosis present

## 2020-12-05 DIAGNOSIS — R4701 Aphasia: Secondary | ICD-10-CM | POA: Diagnosis present

## 2020-12-05 DIAGNOSIS — M21372 Foot drop, left foot: Secondary | ICD-10-CM | POA: Diagnosis present

## 2020-12-05 DIAGNOSIS — R29818 Other symptoms and signs involving the nervous system: Secondary | ICD-10-CM | POA: Diagnosis present

## 2020-12-05 DIAGNOSIS — I110 Hypertensive heart disease with heart failure: Secondary | ICD-10-CM | POA: Diagnosis present

## 2020-12-05 DIAGNOSIS — E875 Hyperkalemia: Secondary | ICD-10-CM | POA: Diagnosis present

## 2020-12-05 DIAGNOSIS — R471 Dysarthria and anarthria: Secondary | ICD-10-CM | POA: Diagnosis present

## 2020-12-05 DIAGNOSIS — G459 Transient cerebral ischemic attack, unspecified: Secondary | ICD-10-CM

## 2020-12-05 DIAGNOSIS — Z7901 Long term (current) use of anticoagulants: Secondary | ICD-10-CM | POA: Diagnosis not present

## 2020-12-05 DIAGNOSIS — Z952 Presence of prosthetic heart valve: Secondary | ICD-10-CM | POA: Diagnosis not present

## 2020-12-05 HISTORY — DX: Transient cerebral ischemic attack, unspecified: G45.9

## 2020-12-05 LAB — CBC
HCT: 47.8 % — ABNORMAL HIGH (ref 36.0–46.0)
Hemoglobin: 16.3 g/dL — ABNORMAL HIGH (ref 12.0–15.0)
MCH: 32.3 pg (ref 26.0–34.0)
MCHC: 34.1 g/dL (ref 30.0–36.0)
MCV: 94.7 fL (ref 80.0–100.0)
Platelets: 209 10*3/uL (ref 150–400)
RBC: 5.05 MIL/uL (ref 3.87–5.11)
RDW: 13.4 % (ref 11.5–15.5)
WBC: 7.2 10*3/uL (ref 4.0–10.5)
nRBC: 0 % (ref 0.0–0.2)

## 2020-12-05 LAB — BASIC METABOLIC PANEL
Anion gap: 10 (ref 5–15)
BUN: 14 mg/dL (ref 8–23)
CO2: 30 mmol/L (ref 22–32)
Calcium: 9.1 mg/dL (ref 8.9–10.3)
Chloride: 92 mmol/L — ABNORMAL LOW (ref 98–111)
Creatinine, Ser: 0.87 mg/dL (ref 0.44–1.00)
GFR, Estimated: >60 mL/min (ref >60)
Glucose, Bld: 93 mg/dL (ref 70–99)
Potassium: 3.5 mmol/L (ref 3.5–5.1)
Sodium: 132 mmol/L — ABNORMAL LOW (ref 135–145)

## 2020-12-05 LAB — MAGNESIUM: Magnesium: 1.8 mg/dL (ref 1.7–2.4)

## 2020-12-05 MED ORDER — APIXABAN 5 MG PO TABS
5.0000 mg | ORAL_TABLET | Freq: Two times a day (BID) | ORAL | Status: DC
  Administered 2020-12-05 – 2020-12-06 (×2): 5 mg via ORAL
  Filled 2020-12-05 (×2): qty 1

## 2020-12-05 MED ORDER — FUROSEMIDE 10 MG/ML IJ SOLN
40.0000 mg | Freq: Every day | INTRAMUSCULAR | Status: DC
  Administered 2020-12-06: 40 mg via INTRAVENOUS
  Filled 2020-12-05: qty 4

## 2020-12-05 NOTE — Progress Notes (Signed)
Physical Therapy Treatment Patient Details Name: Kaitlyn Rodriguez MRN: [id number] DOB: 27-Feb-1934 Today's Date: 12/05/2020   History of Present Illness 87 y.o. female presents to [facility] ED on 12/03/2020 with 2 episodes of AMS and slurred speech. 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 PMH includes anxiety, hypertension, oxygen dependence, aortic dissection, aortic valve replacement, CHF, hypothyroidism, atrial fibrillation, pleural effusion, pulmonary hypertension.    PT Comments    Pt was able to walk a short distance down the hallway, however, we needed to sit and rest for ~ 5 mins as her HR was up to 158 max observed with (+) DOE 3/4.  It took about 5 mins for HR to come back down to the low 100s.  Pt is impulsive, quick to move, needing assist and max cues for safety.  Bring rollator next session as this is what she uses at home at baseline.  Not sure it will look any safer than the normal RW does right now.    Recommendations for follow up therapy are one component of a multi-disciplinary discharge planning process, led by the attending physician.  Recommendations may be updated based on patient status, additional functional criteria and insurance authorization.  Follow Up Recommendations  Home health PT;Supervision for mobility/OOB     Equipment Recommendations  None recommended by PT    Recommendations for Other Services       Precautions / Restrictions Precautions Precautions: Fall Precaution Comments: watch HR     Mobility  Bed Mobility   Bed Mobility: Supine to Sit;Sit to Supine     Supine to sit: Min guard Sit to supine: Min assist   General bed mobility comments: Min guard assist for safety to come to EOB.  Min assist to help lift one leg at a time into bed.    Transfers Overall transfer level: Needs assistance Equipment used: Rolling walker (2 wheeled) Transfers: Sit to/from Stand Sit to Stand: Min assist          General transfer comment: Min assist to steady pt when coming to standing. Pt impulsively letting go of RW throughout standing activities with (+) LOB x2 without UE support.  Ambulation/Gait Ambulation/Gait assistance: Min assist Gait Distance (Feet): 75 Feet (x2 with seated rest 5 min) Assistive device: Rolling walker (2 wheeled) Gait Pattern/deviations: Step-through pattern;Wide base of support;Trunk flexed Gait velocity: too fast to be safe   General Gait Details: pt with increased turnk flexion, requires verbal cues to maintain RW closer to BOS HR in the 150s with (+) SOB 3/4 DOE, seated rest of ~ 5 mins to get back down to low 100s.   Stairs             Wheelchair Mobility    Modified Rankin (Stroke Patients Only) Modified Rankin (Stroke Patients Only) Pre-Morbid Rankin Score: Moderate disability Modified Rankin: Moderately severe disability     Balance Overall balance assessment: Needs assistance Sitting-balance support: No upper extremity supported;Feet supported Sitting balance-Leahy Scale: Good Sitting balance - Comments: able to reach down and take off shoes, adjust socks   Standing balance support: Bilateral upper extremity supported;Single extremity supported;No upper extremity supported Standing balance-Leahy Scale: Poor Standing balance comment: needs external assist from therapist and RW.                            Cognition Arousal/Alertness: Awake/alert Behavior During Therapy: WFL for tasks assessed/performed Overall Cognitive Status: Impaired/Different from  baseline Area of Impairment: Safety/judgement;Awareness;Attention;Problem solving                   Current Attention Level: Selective     Safety/Judgement: Decreased awareness of safety;Decreased awareness of deficits Awareness: Emergent Problem Solving: Slow processing General Comments: Pt emotionally labile, crying at beginning of session, happy other times.  Pt  impulsive.  Sounds like son was here during OT session stating that she is in her normal cognitive state      Exercises Other Exercises Other Exercises: encouraged pursed lip breathign    General Comments General comments (skin integrity, edema, etc.): HR @ 145 with mobilizing to bathroom      Pertinent Vitals/Pain Pain Assessment: No/denies pain Faces Pain Scale: Hurts little more Pain Location: back Pain Descriptors / Indicators: Aching Pain Intervention(s): Limited activity within patient's tolerance    Home Living Family/patient expects to be discharged to:: Private residence Living Arrangements: Children (son) Available Help at Discharge: Family;Available 24 hours/day Type of Home: House Home Access: Stairs to enter Entrance Stairs-Rails: Can reach both Home Layout: One level Home Equipment: Walker - 4 wheels;Bedside commode (transport chair)      Prior Function Level of Independence: Needs assistance  Gait / Transfers Assistance Needed: pt ambulates with use of rollator, although she reports she also often walks without use of a device. Pt only tolerates short bouts of physical activity due to fatigue and back pain. ADL's / Homemaking Assistance Needed: son performs all IADLs; pt has set up for bathing/dressing; sponge baths; interested in how she can get into the tub     PT Goals (current goals can now be found in the care plan section) Acute Rehab PT Goals Patient Stated Goal: to go home Progress towards PT goals: Progressing toward goals    Frequency    Min 4X/week      PT Plan Current plan remains appropriate    Co-evaluation              AM-PAC PT "6 Clicks" Mobility   Outcome Measure  Help needed turning from your back to your side while in a flat bed without using bedrails?: A Little Help needed moving from lying on your back to sitting on the side of a flat bed without using bedrails?: A Little Help needed moving to and from a bed to a chair  (including a wheelchair)?: A Little Help needed standing up from a chair using your arms (e.g., wheelchair or bedside chair)?: A Little Help needed to walk in hospital room?: A Little Help needed climbing 3-5 steps with a railing? : A Little 6 Click Score: 18    End of Session Equipment Utilized During Treatment: Gait belt Activity Tolerance: Patient limited by fatigue Patient left: in bed;with call bell/phone within reach;with bed alarm set Nurse Communication: Mobility status PT Visit Diagnosis: Other abnormalities of gait and mobility (R26.89);Muscle weakness (generalized) (M62.81);Pain     Time: 5102-5852 PT Time Calculation (min) (ACUTE ONLY): 35 min  Charges:  $Gait Training: 23-37 mins                     Corinna Capra, PT, DPT  Acute Rehabilitation Ortho Tech Supervisor [phone] pager [phone]) [phone] office

## 2020-12-05 NOTE — Assessment & Plan Note (Signed)
Cardiology consulted.  Currently stable.

## 2020-12-05 NOTE — Consult Note (Signed)
Cardiology Consultation:   Patient ID: Kaitlyn Rodriguez MRN: [id number]; DOB: 03/20/1933  Admit date: 12/03/2020 Date of Consult: 12/05/2020  PCP:  Elisabeth Most, FNP   [facility] Providers Cardiologist:  Thomasene Ripple, DO    Patient Profile:   Kaitlyn Rodriguez is a 87 y.o. female with a hx of s/p type A aortic dissection repair '20, persistent Afib, hypothyroidism, HTN, who is being seen 12/05/2020 for the evaluation of CHF at the request of Dr. Allena Katz.  History of Present Illness:   [name] is an 87 yo female with PMH noted above. She has been followed by Dr. Servando Salina since 05/2020.  She presented 11/2018 with complaints of sudden onset of shoulder pain and shortness of breath.  Emergent bedside ultrasound was done and noted large circumferential pericardial effusion with marked dilated IVC concerning for cardiac tamponade physiology.  Follow-up CTA of the aorta with type a dissection.  She underwent emergent OR repair with Dr. Vickey Sages.  Postop course was complicated with A. fib which was treated with IV amiodarone and transition to oral medication prior to discharge.  She was also started on Eliquis 5 mg twice daily.  Echo during that admission showed an EF of 35 to 40%, normal RV, mild to moderate MR. Follow-up limited echocardiogram to surgery showed improvement of EF to 50 to 55%.   Unfortunately she was lost to follow-up and did not establish with cardiology until 05/2020.  She was noted to be significantly short of breath at that office visit.  Her metoprolol XL was increased to 50 mg in the morning and 25mg  at night.  It was discussed that going back on amiodarone but she declined.  She was continued on Eliquis.  Echocardiogram 06/2020 showed an EF of 60 to 65%, hypokinesis in the basal septum, normal RV, severely elevated pulmonary artery pressure, severely dilated left atrium, moderately dilated right atrium, moderate TR, mild AS. Diuretics were added at follow up visit.   She was last  seen in the office on 9/26 with Dr. Servando Salina. Noted to be hypertensive at that visit. Said she was not taking her metoprolol and lasix. There was concern regarding adjusting medications given her non-compliance.   Presented to the ED on 10/8 with complaints of issues with speech and confusion.  Family reported she had 2 episodes of altered mental status and slurred speech prior to admission.  First episode was the morning of 10/7 and again in the morning of 10/8.  Both episodes lasted about an hour at a time.  Patient had trouble putting a sentence together.  Initially was resistant to being evaluated but son was able to convince her.  She initially presented to urgent care and was subsequently sent to the ED for further evaluation.  Labs on admission showed sodium 129, potassium 4.2, creatinine 0.74, WBC 8.9, hemoglobin 15.2.  CT head showed small focal hemorrhage in the right thalamus.  Recommendation for follow-up MRI which was completed but she was only able to tolerate abbreviated MRI scan which showed right thalamic susceptibility artifact suggestive of focal hemorrhage or mineralization at the location of hemorrhage that was seen on CT.  Recommendations to hold Eliquis on admission.  EKG showed atrial fibrillation with RVR, 114 bpm.  She was admitted to internal medicine for further management.  Echo 10/9 showed decline in EF to 35-40% with moderately reduced RV function, moderately elevated pulmonary artery pressure, severe biatrial enlargement, severe MR, moderate TR. Cardiology is asked to evaluate regarding her cardiomyopathy.  Past Medical History:  Diagnosis Date   Anxiety disorder, unspecified    Benign essential hypertension    Dissection of unspecified site of aorta (HCC) 11/2018   Heart failure, unspecified (HCC)    History of aortic valve replacement    Hypothyroidism, unspecified    Unspecified osteoarthritis, unspecified site    Vitamin B12 deficiency anemia     Past Surgical  History:  Procedure Laterality Date   ABDOMINAL HYSTERECTOMY     at age 77   REPLACEMENT ASCENDING AORTA N/A 12/20/2018   Procedure: REPAIR OF TYPE A AORTIC DISSECTION USING HEMASHIELD PLATINUM AND GRAFT;  Surgeon: Linden Dolin, MD;  Location: [facility] OR;  Service: Open Heart Surgery;  Laterality: N/A;   STERNAL CLOSURE  12/20/2018   Procedure: STERNAL PLATING;  Surgeon: Linden Dolin, MD;  Location: [facility] OR;  Service: Open Heart Surgery;;     Home Medications:  Prior to Admission medications   Medication Sig Start Date End Date Taking? Authorizing Provider  ALPRAZolam (XANAX) 0.25 MG tablet Take 0.125 mg by mouth 2 (two) times daily as needed for anxiety.   Yes [provider]  apixaban (ELIQUIS) 5 MG TABS tablet Take 1 tablet (5 mg total) by mouth 2 (two) times daily. Patient taking differently: Take 5 mg by mouth daily. 01/08/19  Yes Doree Fudge M, PA-C  cyanocobalamin (,VITAMIN B-12,) 1000 MCG/ML injection Inject 1,000 mcg into the muscle every 30 (thirty) days. 03/02/20  Yes [provider]  ferrous sulfate 325 (65 FE) MG tablet Take 325 mg by mouth every other day.   Yes [provider]  furosemide (LASIX) 40 MG tablet Take 40 mg (one tablet) in the morning daily and take 40 mg (one tablet) every other night. Patient taking differently: Take 40 mg by mouth 2 (two) times daily. 11/21/20  Yes Tobb, Kardie, DO  metoprolol succinate (TOPROL-XL) 50 MG 24 hr tablet Take 1 tablet (50 mg total) by mouth in the morning and at bedtime. Take with or immediately following a meal. 08/11/20  Yes Tobb, Kardie, DO  Multiple Vitamin (MULTIVITAMIN WITH MINERALS) TABS tablet Take 1 tablet by mouth daily. 01/08/19  Yes Ardelle Balls, PA-C  SYNTHROID 125 MCG tablet Take 125 mcg by mouth every morning. 04/22/20  Yes [provider]    Inpatient Medications: Scheduled Meds:   stroke: mapping our early stages of recovery book   Does not apply Once    [START ON 12/06/2020] furosemide  40 mg Intravenous Daily   levothyroxine  125 mcg Oral q morning   metoprolol succinate  50 mg Oral BID WC   Continuous Infusions:  PRN Meds: acetaminophen **OR** acetaminophen (TYLENOL) oral liquid 160 mg/5 mL **OR** acetaminophen, ALPRAZolam, senna-docusate, zolpidem  Allergies:   No Known Allergies  Social History:   Social History   Socioeconomic History   Marital status: Widowed    Spouse name: Not on file   Number of children: Not on file   Years of education: Not on file   Highest education level: Not on file  Occupational History   Not on file  Tobacco Use   Smoking status: Never   Smokeless tobacco: Never  Substance and Sexual Activity   Alcohol use: Yes   Drug use: Never   Sexual activity: Not on file  Other Topics Concern   Not on file  Social History Narrative   Not on file   Social Determinants of Health   Financial Resource Strain: Not on file  Food Insecurity: Not on file  Transportation Needs: Not on file  Physical Activity: Not on file  Stress: Not on file  Social Connections: Not on file  Intimate Partner Violence: Not on file    Family History:   Family History  Problem Relation Age of Onset   Hypertension Father      ROS:  Please see the history of present illness.   All other ROS reviewed and negative.     Physical Exam/Data:   Vitals:   12/04/20 2340 12/05/20 0338 12/05/20 0734 12/05/20 1100  BP: (!) 139/100 132/88 123/84 (!) 149/86  Pulse: 92 92 91   Resp: (!) 24 15 20    Temp: 98.4 F (36.9 C) 98.6 F (37 C) 98 F (36.7 C) 98.2 F (36.8 C)  TempSrc: Oral Axillary  Oral  SpO2: 93% 90% 93% 97%    Intake/Output Summary (Last 24 hours) at 12/05/2020 1449 Last data filed at 12/05/2020 0730 Gross per 24 hour  Intake 600 ml  Output 2151 ml  Net -1551 ml   Last 3 Weights 11/21/2020 07/27/2020 06/09/2020  Weight (lbs) (No Data) 206 lb 3.2 oz 201 lb  Weight (kg) (No Data) 93.532 kg 91.173 kg      There is no height or weight on file to calculate BMI.  General:  Well nourished, well developed, in no acute distress HEENT: normal Neck: no JVD Vascular: No carotid bruits; Distal pulses 2+ bilaterally Cardiac:  normal S1, S2; Irreg Irreg, + systolic murmur LLSB Lungs:  clear to auscultation bilaterally, no wheezing, rhonchi or rales  Abd: soft, nontender, no hepatomegaly  Ext: no edema Musculoskeletal:  No deformities, BUE and BLE strength normal and equal Skin: warm and dry  Neuro:  CNs 2-12 intact, no focal abnormalities noted Psych:  Normal affect   EKG:  The EKG was personally reviewed and demonstrates:  Afib RVR 114 bpm Telemetry:  Telemetry was personally reviewed and demonstrates:  Afib 80-100  Relevant CV Studies:  Echo: 06/2020  IMPRESSIONS     1. Left ventricular ejection fraction, by estimation, is 60 to 65%. The  left ventricle has normal function. The left ventricle has no regional  wall motion abnormalities. There is mild left ventricular hypertrophy.  Left ventricular diastolic parameters  are indeterminate. There is Hypokinetic basal septum.   2. Right ventricular systolic function is normal. The right ventricular  size is normal. There is severely elevated pulmonary artery systolic  pressure.   3. Left atrial size was severely dilated.   4. Right atrial size was moderately dilated.   5. The mitral valve is normal in structure. Mild to moderate mitral valve  regurgitation. No evidence of mitral stenosis.   6. Tricuspid valve regurgitation is moderate.   7. The aortic valve is normal in structure. Aortic valve regurgitation is  mild. Mild aortic valve sclerosis is present, with no evidence of aortic  valve stenosis.   8. Descending aorta measured at 3.1cm.. There is mild dilatation of the  ascending aorta, measuring 39 mm.   9. The inferior vena cava is normal in size with greater than 50%  respiratory variability, suggesting right atrial pressure of  3 mmHg.   FINDINGS   Left Ventricle: Left ventricular ejection fraction, by estimation, is 60  to 65%. The left ventricle has normal function. The left ventricle has no  regional wall motion abnormalities. The left ventricular internal cavity  size was normal in size. There is   mild left ventricular hypertrophy. Hypokinetic basal septum.  Left  ventricular diastolic parameters are indeterminate.   Right Ventricle: The right ventricular size is normal. No increase in  right ventricular wall thickness. Right ventricular systolic function is  normal. There is severely elevated pulmonary artery systolic pressure. The  tricuspid regurgitant velocity is  3.66 m/s, and with an assumed right atrial pressure of 8 mmHg, the  estimated right ventricular systolic pressure is 61.6 mmHg.   Left Atrium: Left atrial size was severely dilated.   Right Atrium: Right atrial size was moderately dilated.   Pericardium: There is no evidence of pericardial effusion.   Mitral Valve: The mitral valve is normal in structure. Mild to moderate  mitral valve regurgitation. No evidence of mitral valve stenosis.   Tricuspid Valve: The tricuspid valve is normal in structure. Tricuspid  valve regurgitation is moderate . No evidence of tricuspid stenosis.   Aortic Valve: The aortic valve is normal in structure. Aortic valve  regurgitation is mild. Mild aortic valve sclerosis is present, with no  evidence of aortic valve stenosis.   Pulmonic Valve: The pulmonic valve was normal in structure. Pulmonic valve  regurgitation is not visualized. No evidence of pulmonic stenosis.   Aorta: Descending aorta measured at 3.1cm. The aortic root is normal in  size and structure. There is mild dilatation of the ascending aorta,  measuring 39 mm.   Venous: The inferior vena cava is normal in size with greater than 50%  respiratory variability, suggesting right atrial pressure of 3 mmHg.   IAS/Shunts: No atrial level shunt  detected by color flow Doppler.   Echo: 12/04/2020  IMPRESSIONS     1. Left ventricular ejection fraction, by estimation, is 35 to 40%. The  left ventricle has moderately decreased function. The left ventricle has  no regional wall motion abnormalities. Left ventricular diastolic  parameters are indeterminate.   2. Right ventricular systolic function is moderately reduced. The right  ventricular size is mildly enlarged. There is mildly elevated pulmonary  artery systolic pressure. The estimated right ventricular systolic  pressure is 36.5 mmHg.   3. Left atrial size was severely dilated.   4. Right atrial size was severely dilated.   5. The mitral valve is normal in structure. Severe mitral valve  regurgitation. No evidence of mitral stenosis.   6. Tricuspid valve regurgitation is moderate.   7. The aortic valve is calcified. Aortic valve regurgitation is mild.  Mild to moderate aortic valve sclerosis/calcification is present, without  any evidence of aortic stenosis.   8. Pulmonic valve regurgitation is moderate.   9. Aortic dilatation noted and root/ascending aorta has been  repaired/replaced. There is mild dilatation of the ascending aorta,  measuring 40 mm.  10. The inferior vena cava is normal in size with greater than 50%  respiratory variability, suggesting right atrial pressure of 3 mmHg.   FINDINGS   Left Ventricle: Left ventricular ejection fraction, by estimation, is 35  to 40%. The left ventricle has moderately decreased function. The left  ventricle has no regional wall motion abnormalities. The left ventricular  internal cavity size was normal in  size. There is no left ventricular hypertrophy. Left ventricular diastolic  parameters are indeterminate.      LV Wall Scoring:  The mid and distal inferior wall and mid inferolateral segment are  akinetic.   Right Ventricle: The right ventricular size is mildly enlarged. No  increase in right ventricular wall  thickness. Right ventricular systolic  function is moderately reduced. There is mildly elevated pulmonary artery  systolic pressure. The tricuspid  regurgitant velocity is 2.32 m/s, and with an assumed right atrial  pressure of 15 mmHg, the estimated right ventricular systolic pressure is  36.5 mmHg.   Left Atrium: Left atrial size was severely dilated.   Right Atrium: Right atrial size was severely dilated.   Pericardium: There is no evidence of pericardial effusion.   Mitral Valve: The mitral valve is normal in structure. Severe mitral valve  regurgitation, with posteriorly-directed jet. No evidence of mitral valve  stenosis.   Tricuspid Valve: The tricuspid valve is normal in structure. Tricuspid  valve regurgitation is moderate . No evidence of tricuspid stenosis.   Aortic Valve: The aortic valve is calcified. Aortic valve regurgitation is  mild. Mild to moderate aortic valve sclerosis/calcification is present,  without any evidence of aortic stenosis.   Pulmonic Valve: The pulmonic valve was normal in structure. Pulmonic valve  regurgitation is moderate. No evidence of pulmonic stenosis.   Aorta: Aortic dilatation noted and the aortic root/ascending aorta has  been repaired/replaced. There is mild dilatation of the ascending aorta,  measuring 40 mm.   Venous: The inferior vena cava is normal in size with greater than 50%  respiratory variability, suggesting right atrial pressure of 3 mmHg.   IAS/Shunts: No atrial level shunt detected by color flow Doppler.   Laboratory Data:  High Sensitivity Troponin:  No results for input(s): TROPONINIHS in the last 720 hours.   Chemistry Recent Labs  Lab 12/03/20 1734 12/03/20 2110 12/05/20 1225  NA 127* 129* 132*  K 6.2* 4.2 3.5  CL 92* 93* 92*  CO2  --  29 30  GLUCOSE 97 105* 93  BUN CREATININE 0.80 0.74 0.87  CALCIUM  --  9.1 9.1  MG  --   --  1.8  GFRNONAA  --  >60 >60  ANIONGAP  --  7 10    Recent Labs   Lab 12/03/20 2110  PROT 6.2*  ALBUMIN 3.3*  AST 23  ALT 11  ALKPHOS 66  BILITOT 1.4*   Lipids  Recent Labs  Lab 12/04/20 0422  CHOL 154  TRIG 72  HDL 46  LDLCALC 94  CHOLHDL 3.3    Hematology Recent Labs  Lab 12/03/20 1214 12/03/20 1734 12/05/20 1225  WBC 8.9  --  7.2  RBC 4.81  --  5.05  HGB 15.2* 14.6 16.3*  HCT 46.5* 43.0 47.8*  MCV 96.7  --  94.7  MCH 31.6  --  32.3  MCHC 32.7  --  34.1  RDW 13.3  --  13.4  PLT 212  --  209   Thyroid No results for input(s): TSH, FREET4 in the last 168 hours.  BNPNo results for input(s): BNP, PROBNP in the last 168 hours.  DDimer No results for input(s): DDIMER in the last 168 hours.   Radiology/Studies:  CT ANGIO HEAD W OR WO CONTRAST  Result Date: 12/03/2020 CLINICAL DATA:  Aphasia. Possible basilar aneurysm. Concern for intracranial hemorrhage. EXAM: CT ANGIOGRAPHY HEAD AND NECK TECHNIQUE: Multidetector CT imaging of the head and neck was performed using the standard protocol during bolus administration of intravenous contrast. Multiplanar CT image reconstructions and MIPs were obtained to evaluate the vascular anatomy. Carotid stenosis measurements (when applicable) are obtained utilizing NASCET criteria, using the distal internal carotid diameter as the denominator. CONTRAST:  OMNIPAQUE IOHEXOL 350 MG/ML SOLN COMPARISON:  Head CT 12/03/2020 Brain MRI/MRA 12/03/2020 FINDINGS: CT HEAD FINDINGS Brain: Mild hyperdensity within the ventral  lateral right thalamus is unchanged. There is generalized atrophy without lobar predilection. There is hypoattenuation of the periventricular white matter, most commonly indicating chronic ischemic microangiopathy. Skull: The visualized skull base, calvarium and extracranial soft tissues are normal. Sinuses/Orbits: No fluid levels or advanced mucosal thickening of the visualized paranasal sinuses. No mastoid or middle ear effusion. The orbits are normal. CTA NECK FINDINGS SKELETON: There is no  bony spinal canal stenosis. No lytic or blastic lesion. OTHER NECK: Normal pharynx, larynx and major salivary glands. No cervical lymphadenopathy. Unremarkable thyroid gland. UPPER CHEST: Biapical ground-glass opacity, likely pulmonary edema. AORTIC ARCH: There is calcific atherosclerosis of the aortic arch. The ascending aorta is dilated, measuring 5.3 cm. There are areas of peripheral luminal irregularity of the ascending aortic arch that are not completely visualized. There has been a remote ascending aortic dissection repair. RIGHT CAROTID SYSTEM: Normal without aneurysm, dissection or stenosis. LEFT CAROTID SYSTEM: Normal without aneurysm, dissection or stenosis. VERTEBRAL ARTERIES: Left dominant configuration. Both origins are clearly patent. There is no dissection, occlusion or flow-limiting stenosis to the skull base (V1-V3 segments). CTA HEAD FINDINGS POSTERIOR CIRCULATION: --Vertebral arteries: Normal V4 segments. --Inferior cerebellar arteries: Normal. --Basilar artery: There is dolichoectasia of the basilar artery with the basilar tip in the superior location impressing upon the floor of the third ventricle. 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 The more proximal basilar diameter is 5 mm. --Superior cerebellar arteries: Normal. --Posterior cerebral arteries (PCA): Normal. The occluded appearance of the right PCA on the earlier MRI must have been artifactual. ANTERIOR CIRCULATION: --Intracranial internal carotid arteries: Normal. --Anterior cerebral arteries (ACA): Normal. Both A1 segments are present. Patent anterior communicating artery (a-comm). --Middle cerebral arteries (MCA): Normal. VENOUS SINUSES: As permitted by contrast timing, patent. ANATOMIC VARIANTS: None Review of the MIP images confirms the above findings. IMPRESSION: 1. No intracranial arterial occlusion or high-grade stenosis. 2. 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 3. Unchanged appearance  of small focus of hyperdensity within the right thalamus. This is less likely to be acute hemorrhage given the lack of edema and the relatively low density compared to the typical appearance of acute blood. Possibilities is include cavernous angioma, mineralization or subacute blood. MRI without contrast may be helpful if the patient can tolerate the full examination. 4. Status post ascending aortic dissection repair, incompletely visualized on this study. The ascending aortic diameter measures 5.3 cm and there are areas of luminal irregularity that are indeterminate without prior studies for comparison. 5. Biapical ground-glass opacity, likely pulmonary edema. Aortic Atherosclerosis (ICD10-I70.0). Electronically Signed   By: Deatra Robinson M.D.   On: 12/03/2020 22:14   CT Head Wo Contrast  Result Date: 12/03/2020 CLINICAL DATA:  Assess for TIA. EXAM: CT HEAD WITHOUT CONTRAST TECHNIQUE: Contiguous axial images were obtained from the base of the skull through the vertex without intravenous contrast. COMPARISON:  December 28, 2018 FINDINGS: Brain: There is question 1 cm aneurysm at the tip the basilar artery. There is small focal area of increased density in the right thalamus, consistent with focal hemorrhage. No evidence of acute infarction, hydrocephalus, extra-axial collection or mass lesion/mass effect. There is chronic diffuse atrophy. Chronic bilateral periventricular white matter small vessel ischemic changes are noted. Vascular: There is question 1 cm aneurysm at the tip of basilar artery. Skull: Normal. Negative for fracture or focal lesion. Sinuses/Orbits: No acute finding. Other: None. IMPRESSION: 1. Small focal hemorrhage in the right thalamus. 2. There is question 1 cm aneurysm  at the tip of the basilar artery. Further evaluation with MRA of the head is recommended. 3. Chronic diffuse atrophy. Chronic bilateral periventricular white matter small vessel ischemic change. These results will be called to  the ordering clinician or representative by the Radiologist Assistant, and communication documented in the PACS or [employer]. Electronically Signed   By: Sherian Rein M.D.   On: 12/03/2020 13:37   CT ANGIO NECK W OR WO CONTRAST  Result Date: 12/03/2020 CLINICAL DATA:  Aphasia. Possible basilar aneurysm. Concern for intracranial hemorrhage. EXAM: CT ANGIOGRAPHY HEAD AND NECK TECHNIQUE: Multidetector CT imaging of the head and neck was performed using the standard protocol during bolus administration of intravenous contrast. Multiplanar CT image reconstructions and MIPs were obtained to evaluate the vascular anatomy. Carotid stenosis measurements (when applicable) are obtained utilizing NASCET criteria, using the distal internal carotid diameter as the denominator. CONTRAST:  OMNIPAQUE IOHEXOL 350 MG/ML SOLN COMPARISON:  Head CT 12/03/2020 Brain MRI/MRA 12/03/2020 FINDINGS: CT HEAD FINDINGS Brain: Mild hyperdensity within the ventral lateral right thalamus is unchanged. There is generalized atrophy without lobar predilection. There is hypoattenuation of the periventricular white matter, most commonly indicating chronic ischemic microangiopathy. Skull: The visualized skull base, calvarium and extracranial soft tissues are normal. Sinuses/Orbits: No fluid levels or advanced mucosal thickening of the visualized paranasal sinuses. No mastoid or middle ear effusion. The orbits are normal. CTA NECK FINDINGS SKELETON: There is no bony spinal canal stenosis. No lytic or blastic lesion. OTHER NECK: Normal pharynx, larynx and major salivary glands. No cervical lymphadenopathy. Unremarkable thyroid gland. UPPER CHEST: Biapical ground-glass opacity, likely pulmonary edema. AORTIC ARCH: There is calcific atherosclerosis of the aortic arch. The ascending aorta is dilated, measuring 5.3 cm. There are areas of peripheral luminal irregularity of the ascending aortic arch that are not completely visualized. There has  been a remote ascending aortic dissection repair. RIGHT CAROTID SYSTEM: Normal without aneurysm, dissection or stenosis. LEFT CAROTID SYSTEM: Normal without aneurysm, dissection or stenosis. VERTEBRAL ARTERIES: Left dominant configuration. Both origins are clearly patent. There is no dissection, occlusion or flow-limiting stenosis to the skull base (V1-V3 segments). CTA HEAD FINDINGS POSTERIOR CIRCULATION: --Vertebral arteries: Normal V4 segments. --Inferior cerebellar arteries: Normal. --Basilar artery: There is dolichoectasia of the basilar artery with the basilar tip in the superior location impressing upon the floor of the third ventricle. 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 The more proximal basilar diameter is 5 mm. --Superior cerebellar arteries: Normal. --Posterior cerebral arteries (PCA): Normal. The occluded appearance of the right PCA on the earlier MRI must have been artifactual. ANTERIOR CIRCULATION: --Intracranial internal carotid arteries: Normal. --Anterior cerebral arteries (ACA): Normal. Both A1 segments are present. Patent anterior communicating artery (a-comm). --Middle cerebral arteries (MCA): Normal. VENOUS SINUSES: As permitted by contrast timing, patent. ANATOMIC VARIANTS: None Review of the MIP images confirms the above findings. IMPRESSION: 1. No intracranial arterial occlusion or high-grade stenosis. 2. 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 3. Unchanged appearance of small focus of hyperdensity within the right thalamus. This is less likely to be acute hemorrhage given the lack of edema and the relatively low density compared to the typical appearance of acute blood. Possibilities is include cavernous angioma, mineralization or subacute blood. MRI without contrast may be helpful if the patient can tolerate the full examination. 4. Status post ascending aortic dissection repair, incompletely visualized on this study. The ascending aortic  diameter measures 5.3 cm and there are areas of luminal irregularity  that are indeterminate without prior studies for comparison. 5. Biapical ground-glass opacity, likely pulmonary edema. Aortic Atherosclerosis (ICD10-I70.0). Electronically Signed   By: Deatra Robinson M.D.   On: 12/03/2020 22:14   MR ANGIO HEAD WO CONTRAST  Result Date: 12/03/2020 CLINICAL DATA:  Acute neurologic deficit EXAM: MRI HEAD WITHOUT CONTRAST MRA HEAD WITHOUT CONTRAST TECHNIQUE: Multiplanar, multi-echo pulse sequences of the brain and surrounding structures were acquired without intravenous contrast. Angiographic images of the Circle of Willis were acquired using MRA technique without intravenous contrast. COMPARISON:  No pertinent prior exam. FINDINGS: MRI HEAD FINDINGS Limited brain MRI protocol was utilized, consisting only of axial diffusion weighted images. These images show no acute infarct. There is magnetic susceptibility effects at the site of the hyperdensity in the right thalamus. MRA HEAD FINDINGS MRA images are motion degraded. POSTERIOR CIRCULATION: --Vertebral arteries: Normal --Inferior cerebellar arteries: Normal. --Basilar artery: Dilated and ectatic distal basilar artery with diameter of 6 mm. --Superior cerebellar arteries: Normal. --Posterior cerebral arteries: Loss of normal flow related enhancement in the right posterior cerebral artery. Fetal origin of the left. ANTERIOR CIRCULATION: --Intracranial internal carotid arteries: Normal. --Anterior cerebral arteries (ACA): Normal. --Middle cerebral arteries (MCA): Normal. ANATOMIC VARIANTS: Fetal origin of the left PCA. IMPRESSION: 1. Truncated and motion degraded examination.  No acute infarct. 2. Area of magnetic susceptibility effects in the right thalamus, corresponding to the hyperdensity seen on the earlier CT. While this may be a small focus of hemorrhage, it is indeterminate and could also indicate a cavernous angioma focus of mineralization. When the patient  can tolerate a, a complete MRI with susceptibility weighted imaging might be helpful. 3. 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 CTA of the head can provide better spatial resolution if patient motion could be minimized. 4. 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 Electronically Signed   By: Deatra Robinson M.D.   On: 12/03/2020 20:11   MR BRAIN WO CONTRAST  Result Date: 12/03/2020 CLINICAL DATA:  Acute neurologic deficit EXAM: MRI HEAD WITHOUT CONTRAST MRA HEAD WITHOUT CONTRAST TECHNIQUE: Multiplanar, multi-echo pulse sequences of the brain and surrounding structures were acquired without intravenous contrast. Angiographic images of the Circle of Willis were acquired using MRA technique without intravenous contrast. COMPARISON:  No pertinent prior exam. FINDINGS: MRI HEAD FINDINGS Limited brain MRI protocol was utilized, consisting only of axial diffusion weighted images. These images show no acute infarct. There is magnetic susceptibility effects at the site of the hyperdensity in the right thalamus. MRA HEAD FINDINGS MRA images are motion degraded. POSTERIOR CIRCULATION: --Vertebral arteries: Normal --Inferior cerebellar arteries: Normal. --Basilar artery: Dilated and ectatic distal basilar artery with diameter of 6 mm. --Superior cerebellar arteries: Normal. --Posterior cerebral arteries: Loss of normal flow related enhancement in the right posterior cerebral artery. Fetal origin of the left. ANTERIOR CIRCULATION: --Intracranial internal carotid arteries: Normal. --Anterior cerebral arteries (ACA): Normal. --Middle cerebral arteries (MCA): Normal. ANATOMIC VARIANTS: Fetal origin of the left PCA. IMPRESSION: 1. Truncated and motion degraded examination.  No acute infarct. 2. Area of magnetic susceptibility effects in the right thalamus, corresponding to the hyperdensity  seen on the earlier CT. While this may be a small focus of hemorrhage, it is indeterminate and could also indicate a cavernous angioma focus of mineralization. When the patient can tolerate a, a complete MRI with susceptibility weighted imaging might be helpful. 3. Basilar dolichoectasia with  diameter of up to 6 mm. Possible basilar tip aneurysm is difficult to assess due to motion. CTA of the head can provide better spatial resolution if patient motion could be minimized. 4. 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 Electronically Signed   By: Deatra Robinson M.D.   On: 12/03/2020 20:11   DG CHEST PORT 1 VIEW  Result Date: 12/04/2020 CLINICAL DATA:  Shortness of breath, history of aortic dissection and AVR, heart failure, persistent atrial fibrillation, hypertension EXAM: PORTABLE CHEST 1 VIEW COMPARISON:  Portable exam 1107 hours compared to 02/16/2019 FINDINGS: Enlargement of cardiac silhouette post median sternotomy. Atherosclerotic calcification and tortuosity of thoracic aorta. Questionable hiatal hernia. Chronic accentuation of interstitial markings similar to 01/04/2019. Improved basilar infiltrates and effusion since previous exam. Linear scarring RIGHT base. No acute infiltrate, pleural effusion, or pneumothorax. Bones demineralized. IMPRESSION: Enlargement of cardiac silhouette post median sternotomy. Atherosclerotic calcification and tortuosity of thoracic aorta. Improved bibasilar pulmonary infiltrates and pleural effusions with underlying chronic lung changes. Electronically Signed   By: Ulyses Southward M.D.   On: 12/04/2020 11:27   ECHOCARDIOGRAM COMPLETE  Result Date: 12/04/2020    ECHOCARDIOGRAM REPORT   Patient Name:   Kaitlyn Rodriguez Date of Exam: 12/04/2020 Medical Rec #:  [id number]     Height:       67.0 in Accession #:    [id number]    Weight:       206.2 lb Date of Birth:  May 01, 1933     BSA:          2.049 m Patient Age:    87 years       BP:           119/79 mmHg Patient Gender: F             HR:           70 bpm. Exam Location:  Inpatient Procedure: 2D Echo, Cardiac Doppler and Color Doppler Indications:    TIA G45.9  History:        Patient has prior history of Echocardiogram examinations, most                 recent 07/13/2020. TIA. 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  Sonographer:    Leta Jungling RDCS Referring Phys: [id number] [facility]  Sonographer Comments: Suboptimal apical window and suboptimal parasternal window. Imaging difficult due to restricted mobility. Patient declined the use of defintiy. IMPRESSIONS  1. Left ventricular ejection fraction, by estimation, is 35 to 40%. The left ventricle has moderately decreased function. The left ventricle has no regional wall motion abnormalities. Left ventricular diastolic parameters are indeterminate.  2. Right ventricular systolic function is moderately reduced. The right ventricular size is mildly enlarged. There is mildly elevated pulmonary artery systolic pressure. The estimated right ventricular systolic pressure is 36.5 mmHg.  3. Left atrial size was severely dilated.  4. Right atrial size was severely dilated.  5. The mitral valve is normal in structure. Severe mitral valve regurgitation. No evidence of mitral stenosis.  6. Tricuspid valve regurgitation is moderate.  7. The aortic valve  is calcified. Aortic valve regurgitation is mild. Mild to moderate aortic valve sclerosis/calcification is present, without any evidence of aortic stenosis.  8. Pulmonic valve regurgitation is moderate.  9. Aortic dilatation noted and root/ascending aorta has been repaired/replaced. There is mild dilatation of the ascending aorta, measuring 40 mm. 10. The inferior vena cava is normal in size with greater than 50%  respiratory variability, suggesting right atrial pressure of 3 mmHg. FINDINGS  Left Ventricle: Left ventricular ejection fraction, by estimation, is 35 to 40%. The left ventricle has moderately decreased function. The left ventricle has no regional wall motion abnormalities. The left ventricular internal cavity size was normal in size. There is no left ventricular hypertrophy. Left ventricular diastolic parameters are indeterminate.  LV Wall Scoring: The mid and distal inferior wall and mid inferolateral segment are akinetic. Right Ventricle: The right ventricular size is mildly enlarged. No increase in right ventricular wall thickness. Right ventricular systolic function is moderately reduced. There is mildly elevated pulmonary artery systolic pressure. The tricuspid regurgitant velocity is 2.32 m/s, and with an assumed right atrial pressure of 15 mmHg, the estimated right ventricular systolic pressure is 36.5 mmHg. Left Atrium: Left atrial size was severely dilated. Right Atrium: Right atrial size was severely dilated. Pericardium: There is no evidence of pericardial effusion. Mitral Valve: The mitral valve is normal in structure. Severe mitral valve regurgitation, with posteriorly-directed jet. No evidence of mitral valve stenosis. Tricuspid Valve: The tricuspid valve is normal in structure. Tricuspid valve regurgitation is moderate . No evidence of tricuspid stenosis. Aortic Valve: The aortic valve is calcified. Aortic valve regurgitation is mild. Mild to moderate aortic valve sclerosis/calcification is present, without any evidence of aortic stenosis. Pulmonic Valve: The pulmonic valve was normal in structure. Pulmonic valve regurgitation is moderate. No evidence of pulmonic stenosis. 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 There is mild dilatation of the ascending aorta, measuring 40 mm. Venous: The inferior vena cava is normal in size with greater than  50% respiratory variability, suggesting right atrial pressure of 3 mmHg. IAS/Shunts: No atrial level shunt detected by color flow Doppler.  LEFT VENTRICLE PLAX 2D LVIDd:         5.10 cm   Diastology LVIDs:         4.20 cm   LV e' medial:    6.41 cm/s LV PW:         1.10 cm   LV E/e' medial:  18.4 LV IVS:        1.00 cm   LV e' lateral:   13.15 cm/s LVOT diam:     2.60 cm   LV E/e' lateral: 9.0 LV SV:         57 LV SV Index:   28 LVOT Area:     5.31 cm  RIGHT VENTRICLE TAPSE (M-mode): 1.1 cm LEFT ATRIUM             Index        RIGHT ATRIUM           Index LA diam:        5.80 cm 2.83 cm/m   RA Area:     30.00 cm LA Vol (A2C):   95.0 ml 46.37 ml/m  RA Volume:   83.90 ml  40.95 ml/m LA Vol (A4C):   95.2 ml 46.47 ml/m LA Biplane Vol: 98.1 ml 47.88 ml/m  AORTIC VALVE LVOT Vmax:   77.70 cm/s LVOT Vmean:  52.000 cm/s LVOT VTI:  0.108 m  AORTA Ao Root diam: 3.80 cm Ao Asc diam:  4.00 cm MITRAL VALVE                  TRICUSPID VALVE MV Area (PHT): 5.14 cm       TR Peak grad:   21.5 mmHg MV Decel Time: 148 msec       TR Vmax:        232.00 cm/s MR Peak grad:    97.6 mmHg MR Mean grad:    73.0 mmHg    SHUNTS MR Vmax:         494.00 cm/s  Systemic VTI:  0.11 m MR Vmean:        414.0 cm/s   Systemic Diam: 2.60 cm MR PISA:         2.26 cm MR PISA Eff ROA: 18 mm MR PISA Radius:  0.60 cm MV E velocity: 118.00 cm/s Donato Schultz MD Electronically signed by Donato Schultz MD Signature Date/Time: 12/04/2020/1:02:02 PM    Final      Assessment and Plan:   Kaitlyn Rodriguez is a 87 y.o. female with a hx of s/p type A aortic dissection repair '20, persistent Afib, hypothyroidism, HTN, who is being seen 12/05/2020 for the evaluation of CHF at the request of Dr. Allena Katz.  HFrEF with moderately reduced RV: EF was initially reduced in the setting of aortic dissection 2020, but improved to 60-65%. Echo this admission with decline to 35-40% with moderately reduce RV, severe bi-atrial enlargement. She has not had any anginal symptoms  PTA. Suspect etiology is likely NICM in the setting of non-compliance, tachy-mediated? 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 Attempted MRI but modified as she was not able to lay still.  -- Eliquis remains on hold to resume per neurology recommendations  Persistent Afib: noted initially post op. Treated with amiodarone in 2020. She was noted to be in Afib at follow up visits this year, but did not want to resume on medication.  -- states she has been complaint with medications, but there has been noncompliance in the past. She remains in Afib but rates are now mostly controlled. Does have severe biatrial enlargement on echo, therefore likely not hold SR with AAD. Since her Eliquis was held on admission unable to convert without DCCV with TEE. With these things noted and concern for noncompliance with medications, will plan for rate control -- continue metoprolol 50mg  BID  -- Eliquis per neurology  S/p type A aortic dissection: repaired with Dr. 2021 '20  HTN: blood pressures are stable with current therapy -- continue metoprolol 50mg  BID  Hypothyroidism: TSH WNL -- on synthroid  Valvular disease: Echo 10/9 notes severe MR, moderate TR. MR has progressed from moderate to severe since echo 5/22.    Risk Assessment/Risk Scores:   CHA2DS2-VASc Score = 7  This indicates a 11.2% annual risk of stroke. The patient's score is based upon: CHF History: 1 HTN History: 1 Diabetes History: 0 Stroke History: 2 Vascular Disease History: 0 Age Score: 2 Gender Score: 1    For questions or updates, please contact [facility] Please consult [email or web address] for contact info under    Signed, , NP  12/05/2020 2:49 PM

## 2020-12-05 NOTE — Assessment & Plan Note (Signed)
Continue Eliquis per neurology.

## 2020-12-05 NOTE — Progress Notes (Signed)
STROKE TEAM PROGRESS NOTE   INTERVAL HISTORY No visitors at bedside. Today patient is alert and oriented x4. She is neurologically stable and still reluctant to consider further work up or consideration for referral to address aneurysm. She states she is too old for this.  We discussed her work up, need for repeat CT.   Vitals:   12/04/20 1933 12/04/20 2340 12/05/20 0338 12/05/20 0734  BP: 122/89 (!) 139/100 132/88 123/84  Pulse: 95 92 92 91  Resp: 19 (!) 24 15 20   Temp: 98.7 F (37.1 C) 98.4 F (36.9 C) 98.6 F (37 C) 98 F (36.7 C)  TempSrc: Oral Oral Axillary   SpO2: 94% 93% 90% 93%   CBC:  Recent Labs  Lab 12/03/20 1214 12/03/20 1734  WBC 8.9  --   NEUTROABS 7.2  --   HGB 15.2* 14.6  HCT 46.5* 43.0  MCV 96.7  --   PLT 212  --    Basic Metabolic Panel:  Recent Labs  Lab 12/03/20 1734 12/03/20 2110  NA 127* 129*  K 6.2* 4.2  CL 92* 93*  CO2  --  29  GLUCOSE 97 105*  BUN 18 10  CREATININE 0.80 0.74  CALCIUM  --  9.1   Lipid Panel:  Recent Labs  Lab 12/04/20 0422  CHOL 154  TRIG 72  HDL 46  CHOLHDL 3.3  VLDL 14  LDLCALC 94   HgbA1c:  Recent Labs  Lab 12/04/20 0422  HGBA1C 5.4   Urine Drug Screen: No results for input(s): LABOPIA, COCAINSCRNUR, LABBENZ, AMPHETMU, THCU, LABBARB in the last 168 hours.  Alcohol Level No results for input(s): ETH in the last 168 hours.  IMAGING past 24 hours DG CHEST PORT 1 VIEW  Result Date: 12/04/2020 CLINICAL DATA:  Shortness of breath, history of aortic dissection and AVR, heart failure, persistent atrial fibrillation, hypertension EXAM: PORTABLE CHEST 1 VIEW COMPARISON:  Portable exam 1107 hours compared to 02/16/2019 FINDINGS: Enlargement of cardiac silhouette post median sternotomy. Atherosclerotic calcification and tortuosity of thoracic aorta. Questionable hiatal hernia. Chronic accentuation of interstitial markings similar to 01/04/2019. Improved basilar infiltrates and effusion since previous exam. Linear  scarring RIGHT base. No acute infiltrate, pleural effusion, or pneumothorax. Bones demineralized. IMPRESSION: Enlargement of cardiac silhouette post median sternotomy. Atherosclerotic calcification and tortuosity of thoracic aorta. Improved bibasilar pulmonary infiltrates and pleural effusions with underlying chronic lung changes. Electronically Signed   By: 13/09/2018 M.D.   On: 12/04/2020 11:27    PHYSICAL EXAM Physical Exam  HEENT-  [place]/AT    Lungs- Respirations unlabored Extremities- Mild pretibial pitting edema   Neurological Examination Mental Status: Awake and alert. Able to follow all simple commands.  Has some difficulty but with coaching is able to complete multi-step exam. Speech is mostly fluent with infrequent Repetition was intact. Able to name common objects.  Rare phonemic paraphasias noted, worst when asked to repeat a phrase. Oriented to situation, state, day of the week and year, but not the month or the city.  Cranial Nerves: II: Temporal visual fields intact with no extinction to DSS. PERRL.   III,IV, VI: No ptosis. EOMI without nystagmus.  V,VII: No facial droop VIII: hearing intact to voice IX,X: No hypophonia XI: Symmetric XII: Midline tongue extension Motor: RUE and RLE 5/5 LUE 5/5 LLE 5/5  No pronator drift. Sensory: Tem and light touch sensation intact throughout, bilaterally. No extinction to DSS.  Deep Tendon Reflexes: 2+ bilateral brachioradialis. Trace patellar reflexes. 0 achilles bilaterally.  Plantars: Right  upgoing toe. Left toe mute.  Cerebellar: No ataxia with FNF bilaterally Gait: Deferred   ASSESSMENT/PLAN Kaitlyn Rodriguez is an 87 y.o. female with a PMHx of anxiety disorder, HTN, type A aortic dissection s/p repair (2020), heart failure (unspecified), aortic valve replacement, atrial fibrillation (on apixaban), hypothyroidism, osteoarthritis and vitamin B12 deficiency anemia, who initially presented to urgent care today with stated concern that she  might be having a stroke. Her family member reports that patient has had 2 episodes of slurred speech and altered mental status that started Friday at around 1 AM, the first occurring at 1 AM and the second episode occurring Saturday morning at approximately 8 AM.  The patient's family reported that these episodes lasted approximately 1 hour each and that during them, the patient was unable to piece together sentences.   Stroke like transient episodes x2 lasting approximately one hour consisting of dysphasia with mild receptive and expressive aphasia, LLE weakness proximally with foot drop distally with a likely etiology of TIA  1. CT head CT head read initially Small focal hemorrhage in the right thalamus, however this is not apparent on Dr. Marlis Edelson evaluation of this imaging. No hemorrhage seen. Instead, this area is believed to be calcified aneurysm. A small calcification is seen slightly anterior to this. There is a possible 1 cm aneurysm at the tip of the basilar artery. Also noted is chronic diffuse atrophy and chronic bilateral periventricular white matter small vessel ischemic change. CTA of head and neck: 1. No intracranial arterial occlusion or high-grade stenosis. 2. 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 3. Unchanged appearance of small focus of hyperdensity within the right thalamus. This is less likely to be acute hemorrhage given the lack of edema and the relatively low density compared to the typical appearance of acute blood. Possibilities include cavernous angioma, mineralization or subacute blood. MRI without contrast may be helpful if the patient can tolerate the full examination. 4. Status post ascending aortic dissection repair, incompletely visualized on this study. The ascending aortic diameter measures 5.3 cm and there are areas of luminal irregularity that are indeterminate without prior studies for comparison. 5. Biapical ground-glass opacity,  likely pulmonary edema.  MRI /MRA 1. Truncated and motion degraded examination.  No acute infarct. 2. Area of magnetic susceptibility effects in the right thalamus, corresponding to the hyperdensity seen on the earlier CT. While thismay be a small focus of hemorrhage, it is indeterminate and could also indicate a cavernous angioma focus of mineralization. When the patient can tolerate a, a complete MRI with susceptibility weighted imaging might be helpful. 3. 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 CTA of the head can provide better spatial resolution if patient motion could be minimized. 4. 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  2D Echo  Left ventricular ejection fraction, by estimation, is 35 to 40%. The  left ventricle has moderately decreased function. The left ventricle has no regional wall motion abnormalities. Left ventricular diastolic  parameters are indeterminate.   2. Right ventricular systolic function is moderately reduced. The right ventricular size is mildly enlarged. There is mildly elevated pulmonary artery systolic pressure. The estimated right ventricular systolic pressure is 36.5 mmHg.   3. Left atrial size was severely dilated.   4. Right atrial size was severely dilated.   5. The mitral valve is normal in structure. Severe mitral  valve  regurgitation. No evidence of mitral stenosis.   6. Tricuspid valve regurgitation is moderate.   7. The aortic valve is calcified. Aortic valve regurgitation is mild.  Mild to moderate aortic valve sclerosis/calcification is present, without  any evidence of aortic stenosis.   8. Pulmonic valve regurgitation is moderate.   9. Aortic dilatation noted and root/ascending aorta has been  repaired/replaced. There is mild dilatation of the ascending aorta, measuring 40 mm.  10. The inferior vena  cava is normal in size with greater than 50%  respiratory variability, suggesting right atrial pressure of 3 mmHg.  LDL 94 HgbA1c 5.4 VTE prophylaxis - on Eliquis     Diet   Diet heart healthy/carb modified Room service appropriate? 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 Ideally MRI should be re-attempted.  Can seek outpatient neurosurgery referral in the future, patient currently not amenable, to be addressed at stroke follow up appt             Fluid overload/CHF EF 35-40%  Management per primary team   Other Stroke Risk Factors Advanced Age >/= 67  Obesity, There is no height or weight on file to calculate BMI., BMI >/= 30 associated with increased stroke risk, recommend weight loss, diet and exercise as appropriate  Congestive heart failure S/p aortic aneurysm repair 2020  Other Active Problems   Hospital day # 0  This patient was seen and evaluated with Dr. 2021. He directed the plan of care.  Viviann Spare, NP-C   STROKE MD NOTE : Patient informs me that she had trouble speaking and was stuttering when states this was mostly because she was anxious and had a panic episode.  She states this happened twice.  She denies focal symptoms suggestive of stroke.  CT scan shows right basal ganglia hyperdensity possibly a small hemorrhage as well as  dolichoectasia of the basilar artery tip with likely 7 mm aneurysm both of which are incidental findings.  Patient is on long-term Eliquis and states she was being compliant.  Patient is quite sure she does not want any aggressive evaluation for brain aneurysm or treatment for it and she will not push for repeat MRI for better brain imaging.  I counseled her to repeat a CT scan.  He is a lot better idea of the small [place] recommendation to see if it is stable despite being on Eliquis.  Discussed with Dr. Shon Hale.  And Dr. Allena Katz neuroradiologist.  Greater than 50% time during this 35-minute visit was spent in counseling and coordination of care and answering questions. Sherlon Handing MD To contact Stroke Continuity provider, please refer to Delia Heady. After hours, contact General Neurology

## 2020-12-05 NOTE — Evaluation (Signed)
Occupational Therapy Evaluation Patient Details Name: Kaitlyn Rodriguez MRN: [id number] DOB: 1934-02-24 Today's Date: 12/05/2020   History of Present Illness 87 y.o. female presents to [facility] ED on 12/03/2020 with 2 episodes of AMS and slurred speech. 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 PMH includes anxiety, hypertension, oxygen dependence, aortic dissection, aortic valve replacement, CHF, hypothyroidism, atrial fibrillation, pleural effusion, pulmonary hypertension.   Clinical Impression   PTA pt lives at home with her son who assists with IADL and ADL as needed.  Pt modified independent with mobility using  rollator. Able to complete LB ADL and mobilize to bathroom with min A with Max HR @ 145. Recommend follow up with HHOT and direct S with all mobility and ADL tasks. Son present and agrees with plan for follow up OT. Will follow acutely.     Recommendations for follow up therapy are one component of a multi-disciplinary discharge planning process, led by the attending physician.  Recommendations may be updated based on patient status, additional functional criteria and insurance authorization.   Follow Up Recommendations  Home health OT    Equipment Recommendations  Tub/shower bench    Recommendations for Other Services       Precautions / Restrictions Precautions Precautions: Fall Precaution Comments: watch HR      Mobility Bed Mobility               General bed mobility comments: OOB in chair    Transfers Overall transfer level: Needs assistance Equipment used: Rolling walker (2 wheeled) Transfers: Sit to/from Stand Sit to Stand: Min guard              Balance Overall balance assessment: Needs assistance Sitting-balance support: No upper extremity supported;Feet supported Sitting balance-Leahy Scale: Good     Standing balance support: Single extremity supported;Bilateral upper extremity supported Standing  balance-Leahy Scale: Poor Standing balance comment: reliant on UE support of walker or railing for stability                           ADL either performed or assessed with clinical judgement   ADL Overall ADL's : Needs assistance/impaired Eating/Feeding: Modified independent   Grooming: Set up;Sitting   Upper Body Bathing: Sitting   Lower Body Bathing: Minimal assistance;Sit to/from stand   Upper Body Dressing : Set up;Sitting   Lower Body Dressing: Minimal assistance;Sit to/from stand   Toilet Transfer: Minimal assistance;Ambulation;RW;Grab bars   Toileting- Clothing Manipulation and Hygiene: Minimal assistance;Sit to/from stand Toileting - Clothing Manipulation Details (indicate cue type and reason): due to donning brief; on lasix; voiding frequently     Functional mobility during ADLs: Minimal assistance;Rolling walker;Cueing for safety General ADL Comments: uses a rollator at baseline     Vision   Vision Assessment?: No apparent visual deficits     Perception     Praxis      Pertinent Vitals/Pain Pain Assessment: Faces Faces Pain Scale: Hurts little more Pain Location: back Pain Descriptors / Indicators: Aching Pain Intervention(s): Limited activity within patient's tolerance     Hand Dominance Right   Extremity/Trunk Assessment Upper Extremity Assessment Upper Extremity Assessment: Overall WFL for tasks assessed   Lower Extremity Assessment Lower Extremity Assessment: Defer to PT evaluation (BLE edema)   Cervical / Trunk Assessment Cervical / Trunk Assessment: Kyphotic   Communication Communication Communication: No difficulties (pt is loquacious)   Cognition Arousal/Alertness: Awake/alert Behavior During Therapy: Anxious Overall Cognitive  Status: Impaired/Different from baseline Area of Impairment: Safety/judgement;Awareness;Attention;Problem solving                   Current Attention Level: Selective     Safety/Judgement:  Decreased awareness of safety;Decreased awareness of deficits Awareness: Emergent Problem Solving: Slow processing General Comments: Son reports cognition is at baseline; pt appears anxious about being in an unfamiliar environemtn which is most likely affecting her problem solving; once in rountine tasks, pt comleted without difficulty; most likely close to baseline; able to recount events of why she is in the hospital   General Comments  HR @ 145 with mobilizing to bathroom    Exercises Exercises: Other exercises Other Exercises Other Exercises: encouraged pursed lip breathign   Shoulder Instructions      Home Living Family/patient expects to be discharged to:: Private residence Living Arrangements: Children (son) Available Help at Discharge: Family;Available 24 hours/day Type of Home: House Home Access: Stairs to enter [employer] of Steps: 4 Entrance Stairs-Rails: Can reach both Home Layout: One level     Bathroom Shower/Tub: Chief Strategy Officer: Standard Bathroom Accessibility: Yes How Accessible: Accessible via walker Home Equipment: Walker - 4 wheels;Bedside commode (transport chair)      Lives With: Son    Prior Functioning/Environment Level of Independence: Needs assistance  Gait / Transfers Assistance Needed: pt ambulates with use of rollator, although she reports she also often walks without use of a device. Pt only tolerates short bouts of physical activity due to fatigue and back pain. ADL's / Homemaking Assistance Needed: son performs all IADLs; pt has set up for bathing/dressing; sponge baths; interested in how she can get into the tub            OT Problem List: Decreased strength;Decreased activity tolerance;Decreased safety awareness;Decreased knowledge of use of DME or AE;Impaired balance (sitting and/or standing);Obesity;Pain      OT Treatment/Interventions: Self-care/ADL training;Therapeutic exercise;Energy conservation;DME  and/or AE instruction;Therapeutic activities;Patient/family education;Balance training    OT Goals(Current goals can be found in the care plan section) Acute Rehab OT Goals Patient Stated Goal: to go home OT Goal Formulation: With patient Time For Goal Achievement: 12/19/20 Potential to Achieve Goals: Good  OT Frequency: Min 2X/week   Barriers to D/C:            Co-evaluation              AM-PAC OT "6 Clicks" Daily Activity     Outcome Measure Help from another person eating meals?: None Help from another person taking care of personal grooming?: A Little Help from another person toileting, which includes using toliet, bedpan, or urinal?: A Little Help from another person bathing (including washing, rinsing, drying)?: A Little Help from another person to put on and taking off regular upper body clothing?: A Little Help from another person to put on and taking off regular lower body clothing?: A Little 6 Click Score: 19   End of Session Equipment Utilized During Treatment: Gait belt;Rolling walker Nurse Communication: Mobility status;Other (comment) (encourage mobility to bathroom)  Activity Tolerance: Patient tolerated treatment well Patient left: in chair;with call bell/phone within reach;with chair alarm set  OT Visit Diagnosis: Unsteadiness on feet (R26.81);Muscle weakness (generalized) (M62.81);Pain Pain - part of body:  (back)                Time: 1601-0932 OT Time Calculation (min): 32 min Charges:  OT General Charges $OT Visit: 1 Visit OT Evaluation $OT Eval Moderate Complexity:  1 Mod OT Treatments $Self Care/Home Management : 8-22 mins  Luisa Dago, OT/L   Acute OT Clinical Specialist Acute Rehabilitation Services Pager [phone] Office [phone]   [facility] 12/05/2020, 2:04 PM

## 2020-12-05 NOTE — Progress Notes (Signed)
  Progress Note    Kaitlyn Rodriguez   WNI:[id number]  DOB: 01/22/1934  DOA: 12/03/2020     0 Date of Service: 12/05/2020   Subjective:  No nausea no vomiting no fever no chills.  Son reports anxiety as well as depression.  No diarrhea breathing improving.  Hospital Problems * Acute focal neurological deficit Stated her small focal hemorrhage of the right colon as per CTA no LVO.  7 mm basal drip aneurysm.  MRI limited but shows evidence of possibility of hemorrhage as well in the right thalamic area. Neurology consulted. Echocardiogram shows EF of 35 to 40%.  Aortic dilation noted as well. LDL 94. Hemoglobin A1c 5.4. Currently on cardiac diet. On Eliquis prior to admission.  Will resume per neurology when indicated.    TIA (transient ischemic attack) Continue Eliquis per neurology.  Thalamic hemorrhage (HCC) Continue Eliquis. Monitor PT OT recommendation.  Persistent atrial fibrillation (HCC) Currently rate controlled but intermittently tachycardic.  Monitor. Per neurology can resume Eliquis.  S/P aortic aneurysm repair Cardiology consulted.  Currently stable.  Anxiety disorder, unspecified Currently Xanax.  Son at bedside also reports possibility of depression.  Wellbutrin will be added.   Benign essential hypertension Blood pressure stable.  Allowing permissive hypertension for now.  Acute combined systolic and diastolic congestive heart failure (HCC) Echocardiogram shows 35% EF along with diastolic dysfunction.  Also has aortic dilation. Appreciate cardiology consult. Continue IV Lasix for now.  Hypothyroidism, unspecified Continue Synthroid.     Objective Vital signs were reviewed and unremarkable.  Vitals:   12/05/20 0734 12/05/20 1100 12/05/20 1537 12/05/20 1700  BP: 123/84 (!) 149/86  118/67  Pulse: 91  (!) 158 84  Resp: 20     Temp: 98 F (36.7 C) 98.2 F (36.8 C)  98.7 F (37.1 C)  TempSrc:  Oral    SpO2: 93% 97% 98%       Exam General:  Appear in mild distress, no Rash; Oral Mucosa Clear, moist. no Abnormal Neck Mass Or lumps, Conjunctiva normal  Cardiovascular: S1 and S2 Present, no Murmur, Respiratory: increased respiratory effort, Bilateral Air entry present and bilateral  Crackles, no wheezes Abdomen: Bowel Sound present, Soft and no tenderness Extremities: trace Pedal edema Neurology: alert and oriented to time, place, and person affect appropriate. no new focal deficit Gait not checked due to patient safety concerns    Labs / Other Information Stable renal function.    Time spent: 35 mins  [facility] 12/05/2020, 7:59 PM

## 2020-12-06 ENCOUNTER — Other Ambulatory Visit (HOSPITAL_COMMUNITY): Payer: Self-pay

## 2020-12-06 ENCOUNTER — Inpatient Hospital Stay (HOSPITAL_COMMUNITY): Payer: Medicare Other

## 2020-12-06 LAB — CBC
HCT: 50.8 % — ABNORMAL HIGH (ref 36.0–46.0)
Hemoglobin: 16.9 g/dL — ABNORMAL HIGH (ref 12.0–15.0)
MCH: 31.7 pg (ref 26.0–34.0)
MCHC: 33.3 g/dL (ref 30.0–36.0)
MCV: 95.3 fL (ref 80.0–100.0)
Platelets: 223 10*3/uL (ref 150–400)
RBC: 5.33 MIL/uL — ABNORMAL HIGH (ref 3.87–5.11)
RDW: 13.5 % (ref 11.5–15.5)
WBC: 8.4 10*3/uL (ref 4.0–10.5)
nRBC: 0 % (ref 0.0–0.2)

## 2020-12-06 LAB — BASIC METABOLIC PANEL
Anion gap: 8 (ref 5–15)
BUN: 15 mg/dL (ref 8–23)
CO2: 34 mmol/L — ABNORMAL HIGH (ref 22–32)
Calcium: 9.4 mg/dL (ref 8.9–10.3)
Chloride: 89 mmol/L — ABNORMAL LOW (ref 98–111)
Creatinine, Ser: 1.07 mg/dL — ABNORMAL HIGH (ref 0.44–1.00)
GFR, Estimated: 51 mL/min — ABNORMAL LOW (ref >60)
Glucose, Bld: 100 mg/dL — ABNORMAL HIGH (ref 70–99)
Potassium: 3.7 mmol/L (ref 3.5–5.1)
Sodium: 131 mmol/L — ABNORMAL LOW (ref 135–145)

## 2020-12-06 MED ORDER — LOSARTAN POTASSIUM 25 MG PO TABS: 25.0000 mg | ORAL_TABLET | Freq: Every day | ORAL | 0 refills | Status: DC

## 2020-12-06 MED ORDER — FUROSEMIDE 40 MG PO TABS: 40.0000 mg | ORAL_TABLET | Freq: Every day | ORAL | Status: DC

## 2020-12-06 MED ORDER — FUROSEMIDE 40 MG PO TABS: ORAL_TABLET | ORAL | 3 refills | Status: DC

## 2020-12-06 MED ORDER — LOSARTAN POTASSIUM 25 MG PO TABS
25.0000 mg | ORAL_TABLET | Freq: Every day | ORAL | 0 refills | Status: DC
  Filled 2020-12-06: qty 30, 30d supply, fill #0

## 2020-12-06 MED ORDER — BUPROPION HCL ER (SR) 100 MG PO TB12
100.0000 mg | ORAL_TABLET | Freq: Two times a day (BID) | ORAL | 0 refills | Status: DC
  Filled 2020-12-06: qty 60, 30d supply, fill #0

## 2020-12-06 MED ORDER — LOSARTAN POTASSIUM 50 MG PO TABS: 25.0000 mg | ORAL_TABLET | Freq: Every day | ORAL | Status: DC

## 2020-12-06 MED ORDER — CITALOPRAM HYDROBROMIDE 10 MG PO TABS
10.0000 mg | ORAL_TABLET | Freq: Every day | ORAL | 2 refills | Status: DC
  Filled 2020-12-06: qty 30, 30d supply, fill #0

## 2020-12-06 NOTE — TOC Transition Note (Signed)
Transition of Care (TOC) - CM/SW Discharge Note Donn Pierini RN,BSN Transitions of Care Unit 4NP (Non Trauma)- RN Case Manager See Treatment Team for direct Phone #    Patient Details  Name: Kaitlyn Rodriguez MRN: [id number] Date of Birth: [birth year]  Transition of Care [facility]) CM/SW Contact:  Darrold Span, RN Phone Number: 12/06/2020, 2:43 PM   Clinical Narrative:    Pt stable for transition home today, noted orders for HHPT/OT. Call made to room to speak with pt, son answered phone and states that they are getting ready to head home. Phone handed to pt to speak with this Clinical research associate. In discussion with pt over phone for transition needs- pt is agreeable to HHPT/OT- choice offered Per CMS guidelines from medicare.gov website with star ratings (copy placed in shadow chart) , per pt she has used HH in past stating she used [facility] and would like to use them again as well as she would like to have the same therapist she had if possible.  Address, phone # and PCP all confirmed with pt in epic. Pt voiced she has needed DME at home.   Call made to [facility] with [facility] for HHPT/OT referral. - referral has been accepted by the [place] office- per their system they do not show pt has had therapy services in the past- only personal care services. They will contact pt to schedule PT/OT visits.    Final next level of care: Home w Home Health Services Barriers to Discharge: No Barriers Identified   Patient Goals and CMS Choice Patient states their goals for this hospitalization and ongoing recovery are:: return home CMS Medicare.gov Compare Post Acute Care list provided to:: Patient Choice offered to / list presented to : Patient, Adult Children  Discharge Placement               Home w/ [facility]        Discharge Plan and Services   Discharge Planning Services: CM Consult Post Acute Care Choice: Home Health                    HH Arranged: PT, OT [facility] Agency: [facility] Date  [facility] Agency Contacted: 12/06/20 Time HH Agency Contacted: [phone] Representative spoke with at [facility] Agency: Arline Asp  Social Determinants of Health (SDOH) Interventions     Readmission Risk Interventions Readmission Risk Prevention Plan 12/06/2020 01/16/2019  Post Dischage Appt Complete -  Medication Screening Complete -  Transportation Screening Complete Complete  PCP or Specialist Appt within 5-7 Days - Complete  Home Care Screening - Complete  Medication Review (RN CM) - Complete  Some recent data might be hidden

## 2020-12-06 NOTE — Progress Notes (Signed)
Progress Note  Patient Name: Kaitlyn Rodriguez Date of Encounter: 12/06/2020  Primary Cardiologist: Thomasene Ripple, DO   Subjective   Patient was seen examined at bedside.  She was sitting up in bed when I arrived.  She had just come back from x-ray.  She was eating her breakfast.  She tells me she is feeling a lot better but is still sad that she is in the hospital.  Inpatient Medications    Scheduled Meds:   stroke: mapping our early stages of recovery book   Does not apply Once   apixaban  5 mg Oral BID   furosemide  40 mg Intravenous Daily   levothyroxine  125 mcg Oral q morning   metoprolol succinate  50 mg Oral BID WC   Continuous Infusions:  PRN Meds: acetaminophen **OR** acetaminophen (TYLENOL) oral liquid 160 mg/5 mL **OR** acetaminophen, ALPRAZolam, senna-docusate, zolpidem   Vital Signs    Vitals:   12/05/20 2000 12/06/20 0500 12/06/20 0742 12/06/20 0858  BP: 124/74 118/86 (!) 143/87   Pulse: 87 83 87   Resp: 20 (!) 21 20   Temp: (!) 97.4 F (36.3 C) 98.6 F (37 C) 97.9 F (36.6 C)   TempSrc: Oral Oral    SpO2: 96% 96% 96% 95%    Intake/Output Summary (Last 24 hours) at 12/06/2020 1008 Last data filed at 12/06/2020 0700 Gross per 24 hour  Intake 360 ml  Output 425 ml  Net -65 ml   There were no vitals filed for this visit.  Telemetry    Telemetry reviewed atrial fibrillation- Personally Reviewed  ECG    EKG shows atrial fibrillation with rapid ventricular rate on December 03, 2020- Personally Reviewed  Physical Exam   GEN: No acute distress.   Neck: No JVD Cardiac: RRR, no murmurs, rubs, or gallops.  Respiratory: Clear to auscultation bilaterally. GI: Soft, nontender, non-distended  MS: No edema; No deformity. Neuro:  Nonfocal  Psych: Normal affect   Labs    Chemistry Recent Labs  Lab 12/03/20 1734 12/03/20 2110 12/05/20 1225  NA 127* 129* 132*  K 6.2* 4.2 3.5  CL 92* 93* 92*  CO2  --  29 30  GLUCOSE 97 105* 93  BUN 18 10 14    CREATININE 0.80 0.74 0.87  CALCIUM  --  9.1 9.1  PROT  --  6.2*  --   ALBUMIN  --  3.3*  --   AST  --  23  --   ALT  --  11  --   ALKPHOS  --  66  --   BILITOT  --  1.4*  --   GFRNONAA  --  >60 >60  ANIONGAP  --  7 10     Hematology Recent Labs  Lab 12/03/20 1214 12/03/20 1734 12/05/20 1225  WBC 8.9  --  7.2  RBC 4.81  --  5.05  HGB 15.2* 14.6 16.3*  HCT 46.5* 43.0 47.8*  MCV 96.7  --  94.7  MCH 31.6  --  32.3  MCHC 32.7  --  34.1  RDW 13.3  --  13.4  PLT 212  --  209    Cardiac EnzymesNo results for input(s): TROPONINI in the last 168 hours. No results for input(s): TROPIPOC in the last 168 hours.   BNPNo results for input(s): BNP, PROBNP in the last 168 hours.   DDimer No results for input(s): DDIMER in the last 168 hours.   Radiology    CT HEAD WO  CONTRAST ( )  Result Date: 12/05/2020 CLINICAL DATA:  Stroke, follow up EXAM: CT HEAD WITHOUT CONTRAST TECHNIQUE: Contiguous axial images were obtained from the base of the skull through the vertex without intravenous contrast. COMPARISON:  MRI and CT December 03, 2020. FINDINGS: Brain: Similar focus of ill-defined hyperdensity within the right thalamus. No evidence of acute large vascular territory infarct. Similar patchy white matter hypodensities, which are nonspecific but compatible with chronic microvascular ischemic disease. No midline shift. Remote left cerebellar infarct. No extra-axial fluid collection. No mass lesion. Vascular: Redemonstrated basilar dolichoectasia with additional vascular findings better characterized on recent MRA Skull: No acute fracture. Sinuses/Orbits: Clear sinuses.  No acute orbital findings. Other: No mastoid effusions. IMPRESSION: 1. Similar focus of ill-defined hyperdensity within the right thalamus, which could represent mineralization (possibly associated with a slow flow vascular malformation) versus hemorrhage. Complete MRI may be useful for further characterization. 2. 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 3. Remote left cerebellar infarct and chronic microvascular ischemic disease. Electronically Signed   By: Feliberto Harts M.D.   On: 12/05/2020 18:45   DG CHEST PORT 1 VIEW  Result Date: 12/04/2020 CLINICAL DATA:  Shortness of breath, history of aortic dissection and AVR, heart failure, persistent atrial fibrillation, hypertension EXAM: PORTABLE CHEST 1 VIEW COMPARISON:  Portable exam 1107 hours compared to 02/16/2019 FINDINGS: Enlargement of cardiac silhouette post median sternotomy. Atherosclerotic calcification and tortuosity of thoracic aorta. Questionable hiatal hernia. Chronic accentuation of interstitial markings similar to 01/04/2019. Improved basilar infiltrates and effusion since previous exam. Linear scarring RIGHT base. No acute infiltrate, pleural effusion, or pneumothorax. Bones demineralized. IMPRESSION: Enlargement of cardiac silhouette post median sternotomy. Atherosclerotic calcification and tortuosity of thoracic aorta. Improved bibasilar pulmonary infiltrates and pleural effusions with underlying chronic lung changes. Electronically Signed   By: Ulyses Southward M.D.   On: 12/04/2020 11:27    Cardiac Studies   Echo: 12/04/2020 IMPRESSIONS   1. Left ventricular ejection fraction, by estimation, is 35 to 40%. The  left ventricle has moderately decreased function. The left ventricle has  no regional wall motion abnormalities. Left ventricular diastolic  parameters are indeterminate.   2. Right ventricular systolic function is moderately reduced. The right  ventricular size is mildly enlarged. There is mildly elevated pulmonary  artery systolic pressure. The estimated right ventricular systolic  pressure is 36.5 mmHg.   3. Left atrial size was severely dilated.   4. Right atrial size was severely dilated.   5. The mitral valve is normal in structure. Severe mitral valve  regurgitation. No evidence of mitral stenosis.   6.  Tricuspid valve regurgitation is moderate.   7. The aortic valve is calcified. Aortic valve regurgitation is mild.  Mild to moderate aortic valve sclerosis/calcification is present, without  any evidence of aortic stenosis.   8. Pulmonic valve regurgitation is moderate.   9. Aortic dilatation noted and root/ascending aorta has been  repaired/replaced. There is mild dilatation of the ascending aorta,  measuring 40 mm.  10. The inferior vena cava is normal in size with greater than 50%  respiratory variability, suggesting right atrial pressure of 3 mmHg.   FINDINGS   Left Ventricle: Left ventricular ejection fraction, by estimation, is 35  to 40%. The left ventricle has moderately decreased function. The left  ventricle has no regional wall motion abnormalities. The left ventricular  internal cavity size was normal in  size. There is no left ventricular hypertrophy. Left ventricular diastolic  parameters are indeterminate.  LV Wall Scoring:  The mid and distal inferior wall and mid inferolateral segment are  akinetic.   Right Ventricle: The right ventricular size is mildly enlarged. No  increase in right ventricular wall thickness. Right ventricular systolic  function is moderately reduced. There is mildly elevated pulmonary artery  systolic pressure. The tricuspid  regurgitant velocity is 2.32 m/s, and with an assumed right atrial  pressure of 15 mmHg, the estimated right ventricular systolic pressure is  36.5 mmHg.   Left Atrium: Left atrial size was severely dilated.   Right Atrium: Right atrial size was severely dilated.   Pericardium: There is no evidence of pericardial effusion.   Mitral Valve: The mitral valve is normal in structure. Severe mitral valve  regurgitation, with posteriorly-directed jet. No evidence of mitral valve  stenosis.   Tricuspid Valve: The tricuspid valve is normal in structure. Tricuspid  valve regurgitation is moderate . No evidence of  tricuspid stenosis.   Aortic Valve: The aortic valve is calcified. Aortic valve regurgitation is  mild. Mild to moderate aortic valve sclerosis/calcification is present,  without any evidence of aortic stenosis.   Pulmonic Valve: The pulmonic valve was normal in structure. Pulmonic valve  regurgitation is moderate. No evidence of pulmonic stenosis.   Aorta: Aortic dilatation noted and the aortic root/ascending aorta has  been repaired/replaced. There is mild dilatation of the ascending aorta,  measuring 40 mm.   Venous: The inferior vena cava is normal in size with greater than 50%  respiratory variability, suggesting right atrial pressure of 3 mmHg.    Patient Profile     87 y.o. female with a hx of s/p type A aortic dissection repair '20, persistent Afib, hypothyroidism, HTN, and was admitted for acute stroke at this time are following for her newly depressed ejection fraction.  Assessment & Plan    HFrEF with moderately reduced RV: The EF has decreased compared to her prior echocardiogram; now 35 to 40% was 60 to 65%.  There is also evidence of  moderately reduce RV, severe bi-atrial enlargement with moderate pulmonary regurgitation.  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  I also question her compliance with her medications given her memory is starting to fade.  Anyhow we will treat the patient medically, she is on metoprolol 50 mg twice daily.  I would like to add losartan 50 mg which then will be easily converted to [facility] in the outpatient setting and then eventually spironolactone along with an SGLT inhibitor.   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 Attempted MRI but modified as she was not able to lay still.  Neurology is also following.    Persistent Afib: She is in  atrial fibrillation today which now could be considered permanent atrial fibrillation as the patient had declined rhythm control strategy in the past.  Continue her metoprolol 50 mg twice daily for rate control heart rate does not improve we can add digoxin 125 MCG daily.  Her Eliquis has been resumed.  I am not sure that following rhythm control will be a good idea in the hospitalization because the compliance of her Eliquis is questionable in addition the patient does not prefer any procedures-she mentioned this during her visit this morning.  We will continue with rate control strategy.    S/p type  A aortic dissection: repaired with Dr. Vickey Sages '20   HTN: blood pressures are stable with current therapy -- continue metoprolol 50mg  BID   Hypothyroidism: TSH WNL -- on synthroid   Valvular disease: Echo 10/9 notes severe MR, moderate TR and moderate PR. MR has progressed from moderate to severe since echo 5/22.  I also discussed with to her by her valvular heart disease and at this time she does not wish to pursue any further evaluation or any interventions.      For questions or updates, please contact [facility] Please consult [email or web address] for contact info under Cardiology/STEMI.      Signed, Shakeya Kerkman, DO  12/06/2020, 10:08 AM

## 2020-12-06 NOTE — Progress Notes (Signed)
   12/06/20 1239  Assess: MEWS Score  Pulse Rate (!) 160 (while walking with PT)  ECG Heart Rate (!) 103  Level of Consciousness Alert  SpO2 95 %  O2 Device Room Air  Assess: MEWS Score  MEWS Temp 0  MEWS Systolic 0  MEWS Pulse 1  MEWS RR 0  MEWS LOC 0  MEWS Score 1  MEWS Score Color Green  Assess: if the MEWS score is Yellow or Red  Were vital signs taken at a resting state? No  Focused Assessment No change from prior assessment  Early Detection of Sepsis Score *See Row Information* Low  MEWS guidelines implemented *See Row Information* No, vital signs rechecked  Treat  MEWS Interventions Administered scheduled meds/treatments;Other (Comment) (was not reported to RN- pt was ambulating wit PT and states she did not feel symptomatic- per cardiac monitor Heart rate was captured at 103bpm, not 160)

## 2020-12-06 NOTE — Progress Notes (Signed)
Patient and son provided with discharge education and materials, verbalized understanding. IV access and cardiac monitor removed, no issues noted. Patient discharged from facility with all personal belongings into the care of son.

## 2020-12-06 NOTE — Progress Notes (Addendum)
STROKE TEAM PROGRESS NOTE   INTERVAL HISTORY No visitors at bedside. Today patient is alert and oriented x4. She is neurologically stable and asking to go home.  Repeat CT scan of the head has been done and the right basal ganglia hypodensity is stable and not increased in size.  Cardiology has seen her and recommends medication changes for her CHF.  Vitals:   12/06/20 0742 12/06/20 0858 12/06/20 1239 12/06/20 1300  BP: (!) 143/87     Pulse: 87  (!) 160 88  Resp: 20   20  Temp: 97.9 F (36.6 C)     TempSrc:      SpO2: 96% 95% 95% 94%   CBC:  Recent Labs  Lab 12/03/20 1214 12/03/20 1734 12/05/20 1225 12/06/20 1042  WBC 8.9  --  7.2 8.4  NEUTROABS 7.2  --   --   --   HGB 15.2*   < > 16.3* 16.9*  HCT 46.5*   < > 47.8* 50.8*  MCV 96.7  --  94.7 95.3  PLT 212  --  209 223   < > = values in this interval not displayed.   Basic Metabolic Panel:  Recent Labs  Lab 12/05/20 1225 12/06/20 1042  NA 132* 131*  K 3.5 3.7  CL 92* 89*  CO2 30 34*  GLUCOSE 93 100*  BUN 14 15  CREATININE 0.87 1.07*  CALCIUM 9.1 9.4  MG 1.8  --    Lipid Panel:  Recent Labs  Lab 12/04/20 0422  CHOL 154  TRIG 72  HDL 46  CHOLHDL 3.3  VLDL 14  LDLCALC 94   HgbA1c:  Recent Labs  Lab 12/04/20 0422  HGBA1C 5.4   Urine Drug Screen: No results for input(s): LABOPIA, COCAINSCRNUR, LABBENZ, AMPHETMU, THCU, LABBARB in the last 168 hours.  Alcohol Level No results for input(s): ETH in the last 168 hours.  IMAGING past 24 hours DG Lumbar Spine 2-3 Views  Result Date: 12/06/2020 CLINICAL DATA:  Low back pain. EXAM: LUMBAR SPINE - 2-3 VIEW COMPARISON:  None. FINDINGS: Alignment of lumbar spine is within normal limits with the exception of mild retrolisthesis of L2 on L3. Multilevel degenerative endplate changes. Disc space narrowing at L2-L3, L4-L5 and L5-S1. Vertebral body heights are maintained. Visualized pelvic bony ring is intact. Evidence for facet arthropathy particularly in the lower lumbar  spine. IMPRESSION: Multilevel degenerative changes in the lumbar spine. Electronically Signed   By: Richarda Overlie M.D.   On: 12/06/2020 11:07   CT HEAD WO CONTRAST ( )  Result Date: 12/05/2020 CLINICAL DATA:  Stroke, follow up EXAM: CT HEAD WITHOUT CONTRAST TECHNIQUE: Contiguous axial images were obtained from the base of the skull through the vertex without intravenous contrast. COMPARISON:  MRI and CT December 03, 2020. FINDINGS: Brain: Similar focus of ill-defined hyperdensity within the right thalamus. No evidence of acute large vascular territory infarct. Similar patchy white matter hypodensities, which are nonspecific but compatible with chronic microvascular ischemic disease. No midline shift. Remote left cerebellar infarct. No extra-axial fluid collection. No mass lesion. Vascular: Redemonstrated basilar dolichoectasia with additional vascular findings better characterized on recent MRA Skull: No acute fracture. Sinuses/Orbits: Clear sinuses.  No acute orbital findings. Other: No mastoid effusions. IMPRESSION: 1. Similar focus of ill-defined hyperdensity within the right thalamus, which could represent mineralization (possibly associated with a slow flow vascular malformation) versus hemorrhage. Complete MRI may be useful for further characterization. 2. 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 3. Remote left cerebellar infarct  and chronic microvascular ischemic disease. Electronically Signed   By: Feliberto Harts M.D.   On: 12/05/2020 18:45    PHYSICAL EXAM Physical Exam  HEENT-  [place]/AT    Lungs- Respirations unlabored Extremities- Mild pretibial pitting edema   Neurological Examination Mental Status: Awake and alert. Able to follow all simple commands.  Has some difficulty but with coaching is able to complete multi-step exam. Speech is mostly fluent with infrequent Repetition was intact. Able to name common objects.  Rare phonemic paraphasias noted, worst when  asked to repeat a phrase. Oriented to situation, state, day of the week and year, but not the month or the city.  Cranial Nerves: II: Temporal visual fields intact with no extinction to DSS. PERRL.   III,IV, VI: No ptosis. EOMI without nystagmus.  V,VII: No facial droop VIII: hearing intact to voice IX,X: No hypophonia XI: Symmetric XII: Midline tongue extension Motor: RUE and RLE 5/5 LUE 5/5 LLE 5/5  No pronator drift. Sensory: Tem and light touch sensation intact throughout, bilaterally. No extinction to DSS.  Deep Tendon Reflexes: 2+ bilateral brachioradialis. Trace patellar reflexes. 0 achilles bilaterally.  Plantars: Right upgoing toe. Left toe mute.  Cerebellar: No ataxia with FNF bilaterally Gait: Deferred   ASSESSMENT/PLAN Kaitlyn Rodriguez is an 87 y.o. female with a PMHx of anxiety disorder, HTN, type A aortic dissection s/p repair (2020), heart failure (unspecified), aortic valve replacement, atrial fibrillation (on apixaban), hypothyroidism, osteoarthritis and vitamin B12 deficiency anemia, who initially presented to urgent care today with stated concern that she might be having a stroke. Her family member reports that patient has had 2 episodes of slurred speech and altered mental status that started Friday at around 1 AM, the first occurring at 1 AM and the second episode occurring Saturday morning at approximately 8 AM.  The patient's family reported that these episodes lasted approximately 1 hour each and that during them, the patient was unable to piece together sentences.   Stroke like transient episodes x2 lasting approximately one hour consisting of dysphasia with mild receptive and expressive aphasia, LLE weakness proximally with foot drop distally with a likely etiology of TIA  1. CT head CT head read initially Small focal hemorrhage in the right thalamus, however this is not apparent on Dr. Marlis Edelson evaluation of this imaging. No hemorrhage seen. Instead, this area is  believed to be calcified aneurysm. A small calcification is seen slightly anterior to this. There is a possible 1 cm aneurysm at the tip of the basilar artery. Also noted is chronic diffuse atrophy and chronic bilateral periventricular white matter small vessel ischemic change. CTA of head and neck: 1. No intracranial arterial occlusion or high-grade stenosis. 2. 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 3. Unchanged appearance of small focus of hyperdensity within the right thalamus. This is less likely to be acute hemorrhage given the lack of edema and the relatively low density compared to the typical appearance of acute blood. Possibilities include cavernous angioma, mineralization or subacute blood. MRI without contrast may be helpful if the patient can tolerate the full examination. 4. Status post ascending aortic dissection repair, incompletely visualized on this study. The ascending aortic diameter measures 5.3 cm and there are areas of luminal irregularity that are indeterminate without prior studies for comparison. 5. Biapical ground-glass opacity, likely pulmonary edema.  MRI /MRA 1. Truncated and motion degraded examination.  No acute infarct. 2. Area of magnetic susceptibility effects in the right thalamus, corresponding to the hyperdensity seen  on the earlier CT. While thismay be a small focus of hemorrhage, it is indeterminate and could also indicate a cavernous angioma focus of mineralization. When the patient can tolerate a, a complete MRI with susceptibility weighted imaging might be helpful. 3. 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 CTA of the head can provide better spatial resolution if patient motion could be minimized. 4. 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  2D Echo  Left ventricular ejection fraction,  by estimation, is 35 to 40%. The  left ventricle has moderately decreased function. The left ventricle has no regional wall motion abnormalities. Left ventricular diastolic  parameters are indeterminate.   2. Right ventricular systolic function is moderately reduced. The right ventricular size is mildly enlarged. There is mildly elevated pulmonary artery systolic pressure. The estimated right ventricular systolic pressure is 36.5 mmHg.   3. Left atrial size was severely dilated.   4. Right atrial size was severely dilated.   5. The mitral valve is normal in structure. Severe mitral valve  regurgitation. No evidence of mitral stenosis.   6. Tricuspid valve regurgitation is moderate.   7. The aortic valve is calcified. Aortic valve regurgitation is mild.  Mild to moderate aortic valve sclerosis/calcification is present, without  any evidence of aortic stenosis.   8. Pulmonic valve regurgitation is moderate.   9. Aortic dilatation noted and root/ascending aorta has been  repaired/replaced. There is mild dilatation of the ascending aorta, measuring 40 mm.  10. The inferior vena cava is normal in size with greater than 50%  respiratory variability, suggesting right atrial pressure of 3 mmHg.  LDL 94 HgbA1c 5.4 VTE prophylaxis - on Eliquis     Diet   Diet heart healthy/carb modified Room service appropriate? No; Fluid consistency: Thin   On Eliquis prior to admission Continue Eliquis  Obtain stat HCT for any neurologic decline  If she agrees to stay consider delirium precautions Therapy recommendations: Home health therapies with 24 hour supervision Disposition:  Home  Hypertension Stable Permissive hypertension (OK if < 220/120) but gradually normalize in 5-7 days Long-term BP goal normotensive  Hyperlipidemia Home meds: None  LDL 94, goal < 70 Add Lipitor 10mg  Continue statin at discharge             Possible basilar tip aneurysm "Basilar dolichoectasia with diameter of up to 6  mm" reported Patient not willing to undergo further testing at this time. Ideally MRI should be re-attempted.  Can seek outpatient neurosurgery referral in the future, patient currently not amenable, to be addressed at stroke follow up appt             Fluid overload/CHF EF 35-40%  Management per primary team   Other Stroke Risk Factors Advanced Age >/= 71  Obesity, There is no height or weight on file to calculate BMI., BMI >/= 30 associated with increased stroke risk, recommend weight loss, diet and exercise as appropriate  Congestive heart failure S/p aortic aneurysm repair 2020  Other Active Problems   Hospital day # 1  Patient neurological exam is unchanged and follow-up.  CT scan shows stable appearance of right basal ganglia hyperdensity possibly a small hemorrhage as well as dolichoectasia of the basilar artery tip with likely 7 mm aneurysm both of which are incidental findings.  Patient is on long-term Eliquis and states she was being compliant.  Patient  is quite sure she does not want any aggressive evaluation for brain aneurysm or treatment for it and she will not push for repeat MRI for better brain imaging. .  Continue mobilize out of bed and therapies.  Discussed with Dr. Allena Katz.   Stroke team will sign off.  Call for questions..  Greater than 50% time during this 25-minute visit was spent in counseling and coordination of care and answering questions. Delia Heady MD To contact Stroke Continuity provider, please refer to [email or web address]. After hours, contact General Neurology

## 2020-12-06 NOTE — Progress Notes (Signed)
Physical Therapy Treatment Patient Details Name: Kaitlyn Rodriguez MRN: [id number] DOB: 1933/03/09 Today's Date: 12/06/2020   History of Present Illness 87 y.o. female presents to [facility] ED on 12/03/2020 with 2 episodes of AMS and slurred speech. 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 PMH includes anxiety, hypertension, oxygen dependence, aortic dissection, aortic valve replacement, CHF, hypothyroidism, atrial fibrillation, pleural effusion, pulmonary hypertension.    PT Comments    Pt is progressing well with gait distance, did better with rollator (which is what she uses at home).  HR continues to go up to 160 bpm with mobility, comes down to 100s in 5 mins with seated rest.  Pt does get dyspneic (DOE 3/4) when HR goes up and requests to sit (which is good as I believe she will sit).  PT will continue to follow acutely for safe mobility progression.     Recommendations for follow up therapy are one component of a multi-disciplinary discharge planning process, led by the attending physician.  Recommendations may be updated based on patient status, additional functional criteria and insurance authorization.  Follow Up Recommendations  Home health PT;Supervision for mobility/OOB     Equipment Recommendations  None recommended by PT    Recommendations for Other Services       Precautions / Restrictions Precautions Precautions: Fall Precaution Comments: watch HR Restrictions Weight Bearing Restrictions: No     Mobility  Bed Mobility               General bed mobility comments: Pt OOB in the recliner chair.    Transfers Overall transfer level: Needs assistance Equipment used: Rolling walker (2 wheeled) Transfers: Sit to/from Stand Sit to Stand: Mod assist;Min assist         General transfer comment: Min assist from recliner chair and rollator, mod assist from low toilet.  Ambulation/Gait Ambulation/Gait assistance: Min  assist Gait Distance (Feet): 90 Feet (90', seated rest, 30') Assistive device: 4-wheeled walker Gait Pattern/deviations: Step-through pattern;Shuffle;Trunk flexed     General Gait Details: Pt did have a better time controlling the rollator during turns and gait today compared to the RW in her room.  Son present and went on a walk with her. HR up to 160 bpms.   Stairs             Wheelchair Mobility    Modified Rankin (Stroke Patients Only)       Balance Overall balance assessment: Needs assistance Sitting-balance support: Feet supported;No upper extremity supported Sitting balance-Leahy Scale: Good     Standing balance support: Bilateral upper extremity supported Standing balance-Leahy Scale: Poor Standing balance comment: Pt can stand statically with very close supervision, however, anything dynamic requires UE suppport and or external support from therapist.                            Cognition Arousal/Alertness: Awake/alert Behavior During Therapy: WFL for tasks assessed/performed Overall Cognitive Status: Impaired/Different from baseline Area of Impairment: Safety/judgement;Awareness;Attention;Problem solving                   Current Attention Level: Selective     Safety/Judgement: Decreased awareness of safety;Decreased awareness of deficits Awareness: Emergent   General Comments: Pt impulsive, needs significant reinforcement of safety cues, may be baseline, son present in this session.      Exercises General Exercises - Upper Extremity Shoulder Flexion: AROM;Both;10 reps;Seated Elbow Flexion: AROM;Both;10 reps;Seated Elbow Extension:  AROM;Both;10 reps;Seated General Exercises - Lower Extremity Long Arc Quad: AROM;Both;10 reps;Seated Hip Flexion/Marching: AROM;Both;10 reps;Seated Toe Raises: AROM;Both;10 reps;Seated    General Comments        Pertinent Vitals/Pain Pain Assessment: Faces Faces Pain Scale: No hurt    Home  Living                      Prior Function            PT Goals (current goals can now be found in the care plan section) Progress towards PT goals: Progressing toward goals    Frequency    Min 4X/week      PT Plan Current plan remains appropriate    Co-evaluation              AM-PAC PT "6 Clicks" Mobility   Outcome Measure  Help needed turning from your back to your side while in a flat bed without using bedrails?: A Little Help needed moving from lying on your back to sitting on the side of a flat bed without using bedrails?: A Little Help needed moving to and from a bed to a chair (including a wheelchair)?: A Little Help needed standing up from a chair using your arms (e.g., wheelchair or bedside chair)?: A Little Help needed to walk in hospital room?: A Little Help needed climbing 3-5 steps with a railing? : A Little 6 Click Score: 18    End of Session Equipment Utilized During Treatment: Gait belt Activity Tolerance: Patient limited by fatigue;Other (comment) (limited by SOB/DOE, HR 160) Patient left: in chair;with call bell/phone within reach;with chair alarm set;with family/visitor present   PT Visit Diagnosis: Other abnormalities of gait and mobility (R26.89);Muscle weakness (generalized) (M62.81);Pain     Time: 1201-1237 PT Time Calculation (min) (ACUTE ONLY): 36 min  Charges:  $Gait Training: 8-22 mins $Therapeutic Activity: 8-22 mins                    Corinna Capra, PT, DPT  Acute Rehabilitation Ortho Tech Supervisor [phone] pager [phone]) [phone] office

## 2020-12-07 ENCOUNTER — Telehealth: Payer: Self-pay | Admitting: Cardiology

## 2020-12-07 ENCOUNTER — Other Ambulatory Visit: Payer: Self-pay

## 2020-12-07 MED ORDER — APIXABAN 5 MG PO TABS: 5.0000 mg | ORAL_TABLET | Freq: Two times a day (BID) | ORAL | 5 refills | Status: DC

## 2020-12-07 NOTE — Telephone Encounter (Signed)
[facility] home health nurse is requesting for verbal order for pt do due physical therapy per discharge instructions twice a week for three weeks and once a week for two weeks

## 2020-12-07 NOTE — Telephone Encounter (Signed)
?*  STAT* If patient is at the pharmacy, call can be transferred to refill team. ? ? ?1. Which medications need to be refilled? (please list name of each medication and dose if known) apixaban (ELIQUIS) 5 MG TABS tablet ? ?2. Which pharmacy/location (including street and city if local pharmacy) is medication to be sent to? [facility] - [place], [place] - [address] ? ?3. Do they need a 30 day or 90 day supply? 90 ? ?

## 2020-12-07 NOTE — Telephone Encounter (Signed)
Prescription refill request for Eliquis received. Indication:Afib Last office visit:9/22 Scr:1.0 Age: 87 Weight:93.5 kg  Prescription refilled

## 2020-12-07 NOTE — Discharge Summary (Signed)
Physician Discharge Summary   Patient name: Kaitlyn Rodriguez  Admit date:     12/03/2020  Discharge date: 12/07/2020  Attending Physician: Rolly Salter [[id number]]  Discharge Physician: Lynden Oxford   PCP: Elisabeth Most, FNP    Follow-up Information     Elisabeth Most, FNP. Schedule an appointment as soon as possible for a visit in 1 week(s).   Specialty: Family Medicine Contact information: [address] [place] [address] [phone]         Thomasene Ripple, DO. Schedule an appointment as soon as possible for a visit in 2 week(s).   Specialty: Cardiology Contact information: [address] [place] [address] [phone]         [facility]. Schedule an appointment as soon as possible for a visit in 1 month(s).   Specialty: Neurology Contact information: [address] [place] [address] [phone]        Care, [facility] Follow up.   Specialty: Home Health Services Why: HHPT/OT arranged w/ [place] branch- they will contact you to schedule home visits Contact information: [address] [place] [address] [phone]                 Recommendations at discharge: follow up with PCP in 1 week  Discharge Diagnoses Principal Problem:   Acute focal neurological deficit Active Problems:   Thalamic hemorrhage (HCC)   TIA (transient ischemic attack)   S/P aortic aneurysm repair   Persistent atrial fibrillation (HCC)   Acute combined systolic and diastolic congestive heart failure (HCC)   Benign essential hypertension   Anxiety disorder, unspecified   Hypothyroidism, unspecified   Resolved Diagnoses Resolved Problems:   * No resolved hospital problems. [facility] Course     * Acute focal neurological deficit Stated her small focal hemorrhage of the right colon as per CTA no LVO.  7 mm basal drip aneurysm.  MRI limited but shows evidence of  possibility of hemorrhage as well in the right thalamic area. Neurology consulted. Echocardiogram shows EF of 35 to 40%.  Aortic dilation noted as well. LDL 94. Hemoglobin A1c 5.4. Currently on cardiac diet. On Eliquis prior to admission.  Will resume per neurology    TIA (transient ischemic attack) Continue Eliquis per neurology.  Thalamic hemorrhage (HCC) Unsure based on the imaging if it hemorrhage or not. Pt unwilling for MRI.  Continue Eliquis.   Persistent atrial fibrillation (HCC) Currently rate controlled but intermittently tachycardic on exertion. Limited room to uptitrate meds in the setting of TIA. outpt follow up with Cardiology  Per neurology can resume Eliquis.  S/P aortic aneurysm repair Cardiology consulted.  Currently stable.  Anxiety disorder, unspecified Currently Xanax.  Son at bedside also reports possibility of depression.  Wellbutrin will be added.   Benign essential hypertension Blood pressure stable.  Allowing permissive hypertension for now.  Acute combined systolic and diastolic congestive heart failure (HCC) Echocardiogram shows 35% EF along with diastolic dysfunction.  Also has aortic dilation. Appreciate cardiology consult. meds adjusted. Pt will follow up outpt with Cardiology  Continue IV Lasix for now.  Hypothyroidism, unspecified Continue Synthroid.     Procedures performed: Echocardiogram    Condition at discharge: good  Exam General: Appear in mild distress, no Rash; Oral Mucosa Clear, moist. no Abnormal Neck Mass Or lumps, Conjunctiva normal  Cardiovascular: S1 and S2 Present, no Murmur, Respiratory: good respiratory effort, Bilateral Air entry present  and CTA, no Crackles, no wheezes Abdomen: Bowel Sound present, Soft and no tenderness Extremities: no Pedal edema Neurology: alert and oriented to time, place, and person affect appropriate. no new focal deficit Gait not checked due to patient safety concerns    Disposition:  Home  Discharge time: greater than 30 minutes. Allergies as of 12/06/2020   No Known Allergies      Medication List     TAKE these medications    ALPRAZolam 0.25 MG tablet Commonly known as: XANAX Take 0.125 mg by mouth 2 (two) times daily as needed for anxiety.   apixaban 5 MG Tabs tablet Commonly known as: ELIQUIS Take 1 tablet (5 mg total) by mouth 2 (two) times daily. What changed: when to take this   citalopram 10 MG tablet Commonly known as: CeleXA Take 1 tablet (10 mg total) by mouth daily.   cyanocobalamin 1000 MCG/ML injection Commonly known as: (VITAMIN B-12) Inject 1,000 mcg into the muscle every 30 (thirty) days.   ferrous sulfate 325 (65 FE) MG tablet Take 325 mg by mouth every other day.   furosemide 40 MG tablet Commonly known as: LASIX Start on 12/08/2020. Take 40 mg (one tablet) in the morning daily and take 40 mg (one tablet) every other night. Start taking on: December 08, 2020 What changed: additional instructions   losartan 25 MG tablet Commonly known as: COZAAR Take 1 tablet (25 mg total) by mouth daily.   metoprolol succinate 50 MG 24 hr tablet Commonly known as: TOPROL-XL Take 1 tablet (50 mg total) by mouth in the morning and at bedtime. Take with or immediately following a meal.   multivitamin with minerals Tabs tablet Take 1 tablet by mouth daily.   Synthroid 125 MCG tablet Generic drug: levothyroxine Take 125 mcg by mouth every morning.        CT ANGIO HEAD W OR WO CONTRAST  Result Date: 12/03/2020 CLINICAL DATA:  Aphasia. Possible basilar aneurysm. Concern for intracranial hemorrhage. EXAM: CT ANGIOGRAPHY HEAD AND NECK TECHNIQUE: Multidetector CT imaging of the head and neck was performed using the standard protocol during bolus administration of intravenous contrast. Multiplanar CT image reconstructions and MIPs were obtained to evaluate the vascular anatomy. Carotid stenosis measurements (when applicable) are obtained utilizing  NASCET criteria, using the distal internal carotid diameter as the denominator. CONTRAST:  OMNIPAQUE IOHEXOL 350 MG/ML SOLN COMPARISON:  Head CT 12/03/2020 Brain MRI/MRA 12/03/2020 FINDINGS: CT HEAD FINDINGS Brain: Mild hyperdensity within the ventral lateral right thalamus is unchanged. There is generalized atrophy without lobar predilection. There is hypoattenuation of the periventricular white matter, most commonly indicating chronic ischemic microangiopathy. Skull: The visualized skull base, calvarium and extracranial soft tissues are normal. Sinuses/Orbits: No fluid levels or advanced mucosal thickening of the visualized paranasal sinuses. No mastoid or middle ear effusion. The orbits are normal. CTA NECK FINDINGS SKELETON: There is no bony spinal canal stenosis. No lytic or blastic lesion. OTHER NECK: Normal pharynx, larynx and major salivary glands. No cervical lymphadenopathy. Unremarkable thyroid gland. UPPER CHEST: Biapical ground-glass opacity, likely pulmonary edema. AORTIC ARCH: There is calcific atherosclerosis of the aortic arch. The ascending aorta is dilated, measuring 5.3 cm. There are areas of peripheral luminal irregularity of the ascending aortic arch that are not completely visualized. There has been a remote ascending aortic dissection repair. RIGHT CAROTID SYSTEM: Normal without aneurysm, dissection or stenosis. LEFT CAROTID SYSTEM: Normal without aneurysm, dissection or stenosis. VERTEBRAL ARTERIES: Left dominant configuration. Both origins are clearly patent. There is no  dissection, occlusion or flow-limiting stenosis to the skull base (V1-V3 segments). CTA HEAD FINDINGS POSTERIOR CIRCULATION: --Vertebral arteries: Normal V4 segments. --Inferior cerebellar arteries: Normal. --Basilar artery: There is dolichoectasia of the basilar artery with the basilar tip in the superior location impressing upon the floor of the third ventricle. There is an aneurysm of the basilar tip measuring 7  mm. The more proximal basilar diameter is 5 mm. --Superior cerebellar arteries: Normal. --Posterior cerebral arteries (PCA): Normal. The occluded appearance of the right PCA on the earlier MRI must have been artifactual. ANTERIOR CIRCULATION: --Intracranial internal carotid arteries: Normal. --Anterior cerebral arteries (ACA): Normal. Both A1 segments are present. Patent anterior communicating artery (a-comm). --Middle cerebral arteries (MCA): Normal. VENOUS SINUSES: As permitted by contrast timing, patent. ANATOMIC VARIANTS: None Review of the MIP images confirms the above findings. IMPRESSION: 1. No intracranial arterial occlusion or high-grade stenosis. 2. 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 3. Unchanged appearance of small focus of hyperdensity within the right thalamus. This is less likely to be acute hemorrhage given the lack of edema and the relatively low density compared to the typical appearance of acute blood. Possibilities is include cavernous angioma, mineralization or subacute blood. MRI without contrast may be helpful if the patient can tolerate the full examination. 4. Status post ascending aortic dissection repair, incompletely visualized on this study. The ascending aortic diameter measures 5.3 cm and there are areas of luminal irregularity that are indeterminate without prior studies for comparison. 5. Biapical ground-glass opacity, likely pulmonary edema. Aortic Atherosclerosis (ICD10-I70.0). Electronically Signed   By: Deatra Robinson M.D.   On: 12/03/2020 22:14   DG Lumbar Spine 2-3 Views  Result Date: 12/06/2020 CLINICAL DATA:  Low back pain. EXAM: LUMBAR SPINE - 2-3 VIEW COMPARISON:  None. FINDINGS: Alignment of lumbar spine is within normal limits with the exception of mild retrolisthesis of L2 on L3. Multilevel degenerative endplate changes. Disc space narrowing at L2-L3, L4-L5 and L5-S1. Vertebral body heights are maintained. Visualized pelvic bony ring is  intact. Evidence for facet arthropathy particularly in the lower lumbar spine. IMPRESSION: Multilevel degenerative changes in the lumbar spine. Electronically Signed   By: Richarda Overlie M.D.   On: 12/06/2020 11:07   CT HEAD WO CONTRAST ( )  Result Date: 12/05/2020 CLINICAL DATA:  Stroke, follow up EXAM: CT HEAD WITHOUT CONTRAST TECHNIQUE: Contiguous axial images were obtained from the base of the skull through the vertex without intravenous contrast. COMPARISON:  MRI and CT December 03, 2020. FINDINGS: Brain: Similar focus of ill-defined hyperdensity within the right thalamus. No evidence of acute large vascular territory infarct. Similar patchy white matter hypodensities, which are nonspecific but compatible with chronic microvascular ischemic disease. No midline shift. Remote left cerebellar infarct. No extra-axial fluid collection. No mass lesion. Vascular: Redemonstrated basilar dolichoectasia with additional vascular findings better characterized on recent MRA Skull: No acute fracture. Sinuses/Orbits: Clear sinuses.  No acute orbital findings. Other: No mastoid effusions. IMPRESSION: 1. Similar focus of ill-defined hyperdensity within the right thalamus, which could represent mineralization (possibly associated with a slow flow vascular malformation) versus hemorrhage. Complete MRI may be useful for further characterization. 2. 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 3. Remote left cerebellar infarct and chronic microvascular ischemic disease. Electronically Signed   By: Feliberto Harts M.D.   On: 12/05/2020 18:45   CT Head Wo Contrast  Result Date: 12/03/2020 CLINICAL DATA:  Assess for TIA. EXAM: CT HEAD WITHOUT CONTRAST TECHNIQUE: Contiguous axial images were obtained from the  base of the skull through the vertex without intravenous contrast. COMPARISON:  December 28, 2018 FINDINGS: Brain: There is question 1 cm aneurysm at the tip the basilar artery. There is small  focal area of increased density in the right thalamus, consistent with focal hemorrhage. No evidence of acute infarction, hydrocephalus, extra-axial collection or mass lesion/mass effect. There is chronic diffuse atrophy. Chronic bilateral periventricular white matter small vessel ischemic changes are noted. Vascular: There is question 1 cm aneurysm at the tip of basilar artery. Skull: Normal. Negative for fracture or focal lesion. Sinuses/Orbits: No acute finding. Other: None. IMPRESSION: 1. Small focal hemorrhage in the right thalamus. 2. There is question 1 cm aneurysm at the tip of the basilar artery. Further evaluation with MRA of the head is recommended. 3. Chronic diffuse atrophy. Chronic bilateral periventricular white matter small vessel ischemic change. These results will be called to the ordering clinician or representative by the Radiologist Assistant, and communication documented in the PACS or [employer]. Electronically Signed   By: Sherian Rein M.D.   On: 12/03/2020 13:37   CT ANGIO NECK W OR WO CONTRAST  Result Date: 12/03/2020 CLINICAL DATA:  Aphasia. Possible basilar aneurysm. Concern for intracranial hemorrhage. EXAM: CT ANGIOGRAPHY HEAD AND NECK TECHNIQUE: Multidetector CT imaging of the head and neck was performed using the standard protocol during bolus administration of intravenous contrast. Multiplanar CT image reconstructions and MIPs were obtained to evaluate the vascular anatomy. Carotid stenosis measurements (when applicable) are obtained utilizing NASCET criteria, using the distal internal carotid diameter as the denominator. CONTRAST:  OMNIPAQUE IOHEXOL 350 MG/ML SOLN COMPARISON:  Head CT 12/03/2020 Brain MRI/MRA 12/03/2020 FINDINGS: CT HEAD FINDINGS Brain: Mild hyperdensity within the ventral lateral right thalamus is unchanged. There is generalized atrophy without lobar predilection. There is hypoattenuation of the periventricular white matter, most commonly  indicating chronic ischemic microangiopathy. Skull: The visualized skull base, calvarium and extracranial soft tissues are normal. Sinuses/Orbits: No fluid levels or advanced mucosal thickening of the visualized paranasal sinuses. No mastoid or middle ear effusion. The orbits are normal. CTA NECK FINDINGS SKELETON: There is no bony spinal canal stenosis. No lytic or blastic lesion. OTHER NECK: Normal pharynx, larynx and major salivary glands. No cervical lymphadenopathy. Unremarkable thyroid gland. UPPER CHEST: Biapical ground-glass opacity, likely pulmonary edema. AORTIC ARCH: There is calcific atherosclerosis of the aortic arch. The ascending aorta is dilated, measuring 5.3 cm. There are areas of peripheral luminal irregularity of the ascending aortic arch that are not completely visualized. There has been a remote ascending aortic dissection repair. RIGHT CAROTID SYSTEM: Normal without aneurysm, dissection or stenosis. LEFT CAROTID SYSTEM: Normal without aneurysm, dissection or stenosis. VERTEBRAL ARTERIES: Left dominant configuration. Both origins are clearly patent. There is no dissection, occlusion or flow-limiting stenosis to the skull base (V1-V3 segments). CTA HEAD FINDINGS POSTERIOR CIRCULATION: --Vertebral arteries: Normal V4 segments. --Inferior cerebellar arteries: Normal. --Basilar artery: There is dolichoectasia of the basilar artery with the basilar tip in the superior location impressing upon the floor of the third ventricle. 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 The more proximal basilar diameter is 5 mm. --Superior cerebellar arteries: Normal. --Posterior cerebral arteries (PCA): Normal. The occluded appearance of the right PCA on the earlier MRI must have been artifactual. ANTERIOR CIRCULATION: --Intracranial internal carotid arteries: Normal. --Anterior cerebral arteries (ACA): Normal. Both A1 segments are present. Patent anterior communicating artery (a-comm). --Middle  cerebral arteries (MCA): Normal. VENOUS SINUSES: As permitted by contrast timing, patent. ANATOMIC VARIANTS:  None Review of the MIP images confirms the above findings. IMPRESSION: 1. No intracranial arterial occlusion or high-grade stenosis. 2. 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 3. Unchanged appearance of small focus of hyperdensity within the right thalamus. This is less likely to be acute hemorrhage given the lack of edema and the relatively low density compared to the typical appearance of acute blood. Possibilities is include cavernous angioma, mineralization or subacute blood. MRI without contrast may be helpful if the patient can tolerate the full examination. 4. Status post ascending aortic dissection repair, incompletely visualized on this study. The ascending aortic diameter measures 5.3 cm and there are areas of luminal irregularity that are indeterminate without prior studies for comparison. 5. Biapical ground-glass opacity, likely pulmonary edema. Aortic Atherosclerosis (ICD10-I70.0). Electronically Signed   By: Deatra Robinson M.D.   On: 12/03/2020 22:14   MR ANGIO HEAD WO CONTRAST  Result Date: 12/03/2020 CLINICAL DATA:  Acute neurologic deficit EXAM: MRI HEAD WITHOUT CONTRAST MRA HEAD WITHOUT CONTRAST TECHNIQUE: Multiplanar, multi-echo pulse sequences of the brain and surrounding structures were acquired without intravenous contrast. Angiographic images of the Circle of Willis were acquired using MRA technique without intravenous contrast. COMPARISON:  No pertinent prior exam. FINDINGS: MRI HEAD FINDINGS Limited brain MRI protocol was utilized, consisting only of axial diffusion weighted images. These images show no acute infarct. There is magnetic susceptibility effects at the site of the hyperdensity in the right thalamus. MRA HEAD FINDINGS MRA images are motion degraded. POSTERIOR CIRCULATION: --Vertebral arteries: Normal --Inferior cerebellar arteries: Normal.  --Basilar artery: Dilated and ectatic distal basilar artery with diameter of 6 mm. --Superior cerebellar arteries: Normal. --Posterior cerebral arteries: Loss of normal flow related enhancement in the right posterior cerebral artery. Fetal origin of the left. ANTERIOR CIRCULATION: --Intracranial internal carotid arteries: Normal. --Anterior cerebral arteries (ACA): Normal. --Middle cerebral arteries (MCA): Normal. ANATOMIC VARIANTS: Fetal origin of the left PCA. IMPRESSION: 1. Truncated and motion degraded examination.  No acute infarct. 2. Area of magnetic susceptibility effects in the right thalamus, corresponding to the hyperdensity seen on the earlier CT. While this may be a small focus of hemorrhage, it is indeterminate and could also indicate a cavernous angioma focus of mineralization. When the patient can tolerate a, a complete MRI with susceptibility weighted imaging might be helpful. 3. 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 CTA of the head can provide better spatial resolution if patient motion could be minimized. 4. 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 Electronically Signed   By: Deatra Robinson M.D.   On: 12/03/2020 20:11   MR BRAIN WO CONTRAST  Result Date: 12/03/2020 CLINICAL DATA:  Acute neurologic deficit EXAM: MRI HEAD WITHOUT CONTRAST MRA HEAD WITHOUT CONTRAST TECHNIQUE: Multiplanar, multi-echo pulse sequences of the brain and surrounding structures were acquired without intravenous contrast. Angiographic images of the Circle of Willis were acquired using MRA technique without intravenous contrast. COMPARISON:  No pertinent prior exam. FINDINGS: MRI HEAD FINDINGS Limited brain MRI protocol was utilized, consisting only of axial diffusion weighted images. These images show no acute infarct. There is magnetic susceptibility effects at the site of the  hyperdensity in the right thalamus. MRA HEAD FINDINGS MRA images are motion degraded. POSTERIOR CIRCULATION: --Vertebral arteries: Normal --Inferior cerebellar arteries: Normal. --Basilar artery: Dilated and ectatic distal basilar artery with diameter of 6 mm. --Superior cerebellar arteries: Normal. --Posterior  cerebral arteries: Loss of normal flow related enhancement in the right posterior cerebral artery. Fetal origin of the left. ANTERIOR CIRCULATION: --Intracranial internal carotid arteries: Normal. --Anterior cerebral arteries (ACA): Normal. --Middle cerebral arteries (MCA): Normal. ANATOMIC VARIANTS: Fetal origin of the left PCA. IMPRESSION: 1. Truncated and motion degraded examination.  No acute infarct. 2. Area of magnetic susceptibility effects in the right thalamus, corresponding to the hyperdensity seen on the earlier CT. While this may be a small focus of hemorrhage, it is indeterminate and could also indicate a cavernous angioma focus of mineralization. When the patient can tolerate a, a complete MRI with susceptibility weighted imaging might be helpful. 3. 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 CTA of the head can provide better spatial resolution if patient motion could be minimized. 4. 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 Electronically Signed   By: Deatra Robinson M.D.   On: 12/03/2020 20:11   DG CHEST PORT 1 VIEW  Result Date: 12/04/2020 CLINICAL DATA:  Shortness of breath, history of aortic dissection and AVR, heart failure, persistent atrial fibrillation, hypertension EXAM: PORTABLE CHEST 1 VIEW COMPARISON:  Portable exam 1107 hours compared to 02/16/2019 FINDINGS: Enlargement of cardiac silhouette post median sternotomy. Atherosclerotic calcification and tortuosity of thoracic aorta. Questionable hiatal hernia. Chronic accentuation of interstitial  markings similar to 01/04/2019. Improved basilar infiltrates and effusion since previous exam. Linear scarring RIGHT base. No acute infiltrate, pleural effusion, or pneumothorax. Bones demineralized. IMPRESSION: Enlargement of cardiac silhouette post median sternotomy. Atherosclerotic calcification and tortuosity of thoracic aorta. Improved bibasilar pulmonary infiltrates and pleural effusions with underlying chronic lung changes. Electronically Signed   By: Ulyses Southward M.D.   On: 12/04/2020 11:27   ECHOCARDIOGRAM COMPLETE  Result Date: 12/04/2020    ECHOCARDIOGRAM REPORT   Patient Name:   LOIE JAHR Date of Exam: 12/04/2020 Medical Rec #:  [id number]     Height:       67.0 in Accession #:    [id number]    Weight:       206.2 lb Date of Birth:  May 07, 1933     BSA:          2.049 m Patient Age:    87 years      BP:           119/79 mmHg Patient Gender: F             HR:           70 bpm. Exam Location:  Inpatient Procedure: 2D Echo, Cardiac Doppler and Color Doppler Indications:    TIA G45.9  History:        Patient has prior history of Echocardiogram examinations, most                 recent 07/13/2020. TIA. 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  Sonographer:    Leta Jungling RDCS Referring Phys: [id number] Cecille Po [facility]  Sonographer Comments: Suboptimal apical window and suboptimal parasternal window. Imaging difficult due to restricted mobility. Patient declined the use  of defintiy. IMPRESSIONS  1. Left ventricular ejection fraction, by estimation, is 35 to 40%. The left ventricle has moderately decreased function. The left ventricle has no regional wall motion abnormalities. Left ventricular diastolic parameters are indeterminate.  2. Right ventricular systolic function is moderately reduced. The right ventricular size is mildly  enlarged. There is mildly elevated pulmonary artery systolic pressure. The estimated right ventricular systolic pressure is 36.5 mmHg.  3. Left atrial size was severely dilated.  4. Right atrial size was severely dilated.  5. The mitral valve is normal in structure. Severe mitral valve regurgitation. No evidence of mitral stenosis.  6. Tricuspid valve regurgitation is moderate.  7. The aortic valve is calcified. Aortic valve regurgitation is mild. Mild to moderate aortic valve sclerosis/calcification is present, without any evidence of aortic stenosis.  8. Pulmonic valve regurgitation is moderate.  9. Aortic dilatation noted and root/ascending aorta has been repaired/replaced. There is mild dilatation of the ascending aorta, measuring 40 mm. 10. The inferior vena cava is normal in size with greater than 50% respiratory variability, suggesting right atrial pressure of 3 mmHg. FINDINGS  Left Ventricle: Left ventricular ejection fraction, by estimation, is 35 to 40%. The left ventricle has moderately decreased function. The left ventricle has no regional wall motion abnormalities. The left ventricular internal cavity size was normal in size. There is no left ventricular hypertrophy. Left ventricular diastolic parameters are indeterminate.  LV Wall Scoring: The mid and distal inferior wall and mid inferolateral segment are akinetic. Right Ventricle: The right ventricular size is mildly enlarged. No increase in right ventricular wall thickness. Right ventricular systolic function is moderately reduced. There is mildly elevated pulmonary artery systolic pressure. The tricuspid regurgitant velocity is 2.32 m/s, and with an assumed right atrial pressure of 15 mmHg, the estimated right ventricular systolic pressure is 36.5 mmHg. Left Atrium: Left atrial size was severely dilated. Right Atrium: Right atrial size was severely dilated. Pericardium: There is no evidence of pericardial effusion. Mitral Valve: The mitral valve is  normal in structure. Severe mitral valve regurgitation, with posteriorly-directed jet. No evidence of mitral valve stenosis. Tricuspid Valve: The tricuspid valve is normal in structure. Tricuspid valve regurgitation is moderate . No evidence of tricuspid stenosis. Aortic Valve: The aortic valve is calcified. Aortic valve regurgitation is mild. Mild to moderate aortic valve sclerosis/calcification is present, without any evidence of aortic stenosis. Pulmonic Valve: The pulmonic valve was normal in structure. Pulmonic valve regurgitation is moderate. No evidence of pulmonic stenosis. 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 There is mild dilatation of the ascending aorta, measuring 40 mm. Venous: The inferior vena cava is normal in size with greater than 50% respiratory variability, suggesting right atrial pressure of 3 mmHg. IAS/Shunts: No atrial level shunt detected by color flow Doppler.  LEFT VENTRICLE PLAX 2D LVIDd:         5.10 cm   Diastology LVIDs:         4.20 cm   LV e' medial:    6.41 cm/s LV PW:         1.10 cm   LV E/e' medial:  18.4 LV IVS:        1.00 cm   LV e' lateral:   13.15 cm/s LVOT diam:     2.60 cm   LV E/e' lateral: 9.0 LV SV:         57 LV SV Index:   28 LVOT Area:     5.31 cm  RIGHT VENTRICLE TAPSE (M-mode): 1.1 cm LEFT ATRIUM             Index        RIGHT ATRIUM           Index LA diam:        5.80 cm 2.83 cm/m   RA Area:     30.00 cm LA Vol (A2C):   95.0 ml 46.37 ml/m  RA Volume:   83.90 ml  40.95 ml/m LA Vol (A4C):   95.2 ml 46.47 ml/m LA Biplane Vol: 98.1 ml 47.88 ml/m  AORTIC VALVE LVOT Vmax:   77.70 cm/s LVOT Vmean:  52.000 cm/s LVOT VTI:    0.108 m  AORTA Ao Root diam: 3.80 cm Ao Asc diam:  4.00 cm MITRAL VALVE                  TRICUSPID VALVE MV Area (PHT): 5.14 cm       TR Peak grad:   21.5 mmHg MV Decel Time: 148 msec       TR Vmax:        232.00 cm/s MR Peak grad:    97.6 mmHg MR Mean grad:    73.0 mmHg    SHUNTS MR  Vmax:         494.00 cm/s  Systemic VTI:  0.11 m MR Vmean:        414.0 cm/s   Systemic Diam: 2.60 cm MR PISA:         2.26 cm MR PISA Eff ROA: 18 mm MR PISA Radius:  0.60 cm MV E velocity: 118.00 cm/s Donato Schultz MD Electronically signed by Donato Schultz MD Signature Date/Time: 12/04/2020/1:02:02 PM    Final    Results for orders placed or performed during the hospital encounter of 12/03/20  Resp Panel by RT-PCR (Flu A&B, Covid) Nasopharyngeal Swab     Status: None   Collection Time: 12/03/20  4:01 PM   Specimen: Nasopharyngeal Swab; Nasopharyngeal(NP) swabs in vial transport medium  Result Value Ref Range Status   SARS Coronavirus 2 by RT PCR NEGATIVE NEGATIVE Final    Comment: (NOTE) SARS-CoV-2 target nucleic acids are NOT DETECTED.  The SARS-CoV-2 RNA is generally detectable in upper respiratory specimens during the acute phase of infection. The lowest concentration of SARS-CoV-2 viral copies this assay can detect is 138 copies/mL. A negative result does not preclude SARS-Cov-2 infection and should not be used as the sole basis for treatment or other patient management decisions. A negative result may occur with  improper specimen collection/handling, submission of specimen other than nasopharyngeal swab, presence of viral mutation(s) within the areas targeted by this assay, and inadequate number of viral copies(<138 copies/mL). A negative result must be combined with clinical observations, patient history, and epidemiological information. The expected result is Negative.  Fact Sheet for Patients:  BloggerCourse.com  Fact Sheet for Healthcare Providers:  SeriousBroker.it  This test is no t yet approved or cleared by the [place] FDA and  has been authorized for detection and/or diagnosis of SARS-CoV-2 by FDA under an Emergency Use Authorization (EUA). This EUA will remain  in effect (meaning this test can be used) for the  duration of the COVID-19 declaration under Section 564(b)(1) of the Act, 21 U.S.C.section 360bbb-3(b)(1), unless the authorization is terminated  or revoked sooner.       Influenza A by PCR NEGATIVE NEGATIVE Final   Influenza B by PCR NEGATIVE NEGATIVE Final    Comment: (NOTE) The  Xpert Xpress SARS-CoV-2/FLU/RSV plus assay is intended as an aid in the diagnosis of influenza from Nasopharyngeal swab specimens and should not be used as a sole basis for treatment. Nasal washings and aspirates are unacceptable for Xpert Xpress SARS-CoV-2/FLU/RSV testing.  Fact Sheet for Patients: BloggerCourse.com  Fact Sheet for Healthcare Providers: SeriousBroker.it  This test is not yet approved or cleared by the [place] FDA and has been authorized for detection and/or diagnosis of SARS-CoV-2 by FDA under an Emergency Use Authorization (EUA). This EUA will remain in effect (meaning this test can be used) for the duration of the COVID-19 declaration under Section 564(b)(1) of the Act, 21 U.S.C. section 360bbb-3(b)(1), unless the authorization is terminated or revoked.  Performed at [facility], [address], [place], [place] [address]     Signed:  Lynden Oxford MD.  [facility] 12/07/2020, 6:53 AM

## 2020-12-08 NOTE — Telephone Encounter (Signed)
Returned call for Debroah Loop, no answer at this time. Left a message to call back.

## 2020-12-08 NOTE — Telephone Encounter (Signed)
Debroah Loop called back when told pt's PCP should give those orders he states "She does not have a PCP." He states she had one but something happened and now she has to find a new one. I asked could he call the hospital to have them handle the matter and he states "they don't usually follow up they just give the initial order." He is requesting 3 visits a week for 3 weeks and one visit a week for 2 weeks. Will relay message to Dr. Servando Salina.

## 2020-12-08 NOTE — Telephone Encounter (Signed)
Ok can give the verbal this time to avoid delay, but she would have to establish with a PCP if other orders are needed.

## 2020-12-08 NOTE — Telephone Encounter (Signed)
Called Debroah Loop back to let him know Dr. Servando Salina will give the order this one time but the pt needs to get a PCP. He verbalized understanding and will send the paperwork to [place].

## 2020-12-09 ENCOUNTER — Telehealth: Payer: Self-pay | Admitting: Cardiology

## 2020-12-09 NOTE — Telephone Encounter (Signed)
Don a Acupuncturist with [facility] is calling requesting verbal orders. The care giver declined the OT eval this week and wants him to come out next week. He is needing verbal okay to do so.

## 2020-12-09 NOTE — Telephone Encounter (Signed)
Returned call to [facility] at [place], advised him that it was okay per Dr. Servando Salina to go out next week for OT eval. Advised him to call back with any issues, questions, or concerns. He verbalized understanding.   Tobb, Kardie, DO  You; Reynolds Bowl, RN 1 hour ago (2:08 PM)   That will be fine

## 2020-12-09 NOTE — Telephone Encounter (Signed)
Patient was returning call, he said it okay to leave a verbal okay on his VM

## 2020-12-09 NOTE — Telephone Encounter (Signed)
Attempted to return call to [facility] at [place]. Unable to reach, left message to call back when able.

## 2020-12-13 ENCOUNTER — Telehealth: Payer: Self-pay | Admitting: Cardiology

## 2020-12-13 NOTE — Telephone Encounter (Signed)
New Message:    Roe Coombs from [place] called. He wanted Dr Servando Salina to know patient refused Occupational Therapy Service and Evaluation

## 2020-12-27 ENCOUNTER — Telehealth: Payer: Self-pay | Admitting: Cardiology

## 2020-12-27 NOTE — Telephone Encounter (Signed)
Pt c/o medication issue:  1. Name of Medication:  Tylenol/Aleve  2. How are you currently taking this medication (dosage and times per day)?  Patient takes Tylenol 2X daily Patient does not take Aleve  3. Are you having a reaction (difficulty breathing--STAT)?  No   4. What is your medication issue?   Patient states she has been experiencing severe back aches for the past several months. She states she has been taking Tylenol twice daily to relieve pain, but she is still in a lot of pain. She would like to know if it is alright for her to try Aleve. However, she has concerns that Aleve may raise her BP. She states she also tries icing/heating compress daily, but she has had minimal relief.  Please return call to discuss when able.

## 2020-12-27 NOTE — Telephone Encounter (Signed)
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

## 2021-01-02 ENCOUNTER — Ambulatory Visit: Payer: Medicare Other | Admitting: Cardiology

## 2021-01-02 ENCOUNTER — Encounter: Payer: Self-pay | Admitting: Cardiology

## 2021-01-02 ENCOUNTER — Other Ambulatory Visit: Payer: Self-pay | Admitting: *Deleted

## 2021-01-02 ENCOUNTER — Other Ambulatory Visit: Payer: Self-pay

## 2021-01-02 VITALS — BP 136/78 | HR 70 | Ht 67.0 in | Wt 206.0 lb

## 2021-01-02 DIAGNOSIS — Z79899 Other long term (current) drug therapy: Secondary | ICD-10-CM | POA: Diagnosis not present

## 2021-01-02 DIAGNOSIS — R0989 Other specified symptoms and signs involving the circulatory and respiratory systems: Secondary | ICD-10-CM

## 2021-01-02 DIAGNOSIS — I4819 Other persistent atrial fibrillation: Secondary | ICD-10-CM

## 2021-01-02 DIAGNOSIS — I340 Nonrheumatic mitral (valve) insufficiency: Secondary | ICD-10-CM

## 2021-01-02 DIAGNOSIS — I071 Rheumatic tricuspid insufficiency: Secondary | ICD-10-CM | POA: Diagnosis not present

## 2021-01-02 DIAGNOSIS — G459 Transient cerebral ischemic attack, unspecified: Secondary | ICD-10-CM

## 2021-01-02 DIAGNOSIS — I371 Nonrheumatic pulmonary valve insufficiency: Secondary | ICD-10-CM

## 2021-01-02 DIAGNOSIS — E669 Obesity, unspecified: Secondary | ICD-10-CM

## 2021-01-02 MED ORDER — LOSARTAN POTASSIUM 25 MG PO TABS: 25.0000 mg | ORAL_TABLET | Freq: Every day | ORAL | 3 refills | Status: DC

## 2021-01-02 MED ORDER — METOPROLOL SUCCINATE ER 50 MG PO TB24: 50.0000 mg | ORAL_TABLET | Freq: Two times a day (BID) | ORAL | 3 refills | Status: DC

## 2021-01-02 MED ORDER — FUROSEMIDE 40 MG PO TABS
ORAL_TABLET | ORAL | 3 refills | Status: DC
Start: 2021-01-02 — End: 2021-06-09

## 2021-01-02 NOTE — Patient Instructions (Addendum)
Medication Instructions:  Your physician recommends that you continue on your current medications as directed. Please refer to the Current Medication list given to you today.  *If you need a refill on your cardiac medications before your next appointment, please call your pharmacy*   Lab Work: Your physician recommends that you return for lab work in:  TODAY: BMET, Mag If you have labs (blood work) drawn today and your tests are completely normal, you will receive your results only by: MyChart Message (if you have MyChart) OR A paper copy in the mail If you have any lab test that is abnormal or we need to change your treatment, we will call you to review the results.   Testing/Procedures: None   Follow-Up: At [facility], you and your health needs are our priority.  As part of our continuing mission to provide you with exceptional heart care, we have created designated Provider Care Teams.  These Care Teams include your primary Cardiologist (physician) and Advanced Practice Providers (APPs -  Physician Assistants and Nurse Practitioners) who all work together to provide you with the care you need, when you need it.  We recommend signing up for the patient portal called "MyChart".  Sign up information is provided on this After Visit Summary.  MyChart is used to connect with patients for Virtual Visits (Telemedicine).  Patients are able to view lab/test results, encounter notes, upcoming appointments, etc.  Non-urgent messages can be sent to your provider as well.   To learn more about what you can do with MyChart, go to [email or web address].    Your next appointment:   3 month(s)  The format for your next appointment:   In Person  Provider:   Gypsy Balsam, MD    Other Instructions

## 2021-01-02 NOTE — Progress Notes (Signed)
Cardiology Office Note:    Date:  01/02/2021   ID:  [name], DOB 1933/04/14, MRN [id number]  PCP:  Timoteo Gaul, FNP  Cardiologist:  Berniece Salines, DO  Electrophysiologist:  None   Referring MD: Timoteo Gaul, FNP   Chief Complaint  Patient presents with   Follow-up    Post hospital.   History of Present Illness:    ETTY MODEL is a 87 y.o. female with a hx of of repair type a aortic dissection in 2020, A. Fib, hypothyroidism.   I saw the patient 1 July 27, 2021 at that time she had not seen cardiology since her emergent surgery in 2020 for her type a aortic dissection the patient had follow-up with cardiothoracic surgery and had been advised multiple times to see cardiology she tells me but she has not been able to to make this appointment.     I saw the patient for the first time in June 09, 2020 at that time she was in atrial fibrillation with rapid ventricular rate I increase her Toprol-XL to 50 mg in the morning and 25 mg at night.  We continued her on her Eliquis.During her visit I also spoke with the patient with rhythm control strategy but she declined as she said she had bad experience with amiodarone and did not want to take any other medications. She was hypertensive.  But since increasing her beta-blocker we opted to wait and see what response she would get. An echocardiogram was ordered.     She followed up on July 27, 2020 at that time we discussed her echocardiogram results which show severe pulmonary arterial hypertension, moderate mitral regurgitation and moderate tricuspid regurgitation.  After discussing shared decision I placed the patient on diuretics to help with her volume overload.  We also discussed during that visit rhythm control for her atrial fibrillation.  But she declined any use of antiarrhythmics and her metoprolol was increased to 50 mg twice a day.  Saw the patient in November 21, 2020 at that time she was hypertensive unfortunately she had  not been taking her medications.  I had a discussion with the patient about adhering to her medication as well as the metoprolol.   Since I saw the patient she was seen at [facility] for acute focal deficits with questionable hemorrhage in the right thalamic area but unfortunately the patient declined to get an MRI.  She was then noted to have a new depressed EF 35 to 40%.  At that time her medications were adjusted. Since her hospitalization she has had home PT and she has been compliant with her medications.  She is here today with her son.   Past Medical History:  Diagnosis Date   Anxiety disorder, unspecified    Benign essential hypertension    Dissection of unspecified site of aorta ([place]) 11/2018   Heart failure, unspecified ([place])    History of aortic valve replacement    Hypothyroidism, unspecified    Unspecified osteoarthritis, unspecified site    Vitamin B12 deficiency anemia     Past Surgical History:  Procedure Laterality Date   ABDOMINAL HYSTERECTOMY     at age 65   REPLACEMENT ASCENDING AORTA N/A 12/20/2018   Procedure: REPAIR OF TYPE A AORTIC DISSECTION USING HEMASHIELD PLATINUM 28MM AND 32MM GRAFT;  Surgeon: Wonda Olds, MD;  Location: [facility];  Service: Open Heart Surgery;  Laterality: N/A;   STERNAL CLOSURE  12/20/2018   Procedure: STERNAL PLATING;  Surgeon: Linden Dolin, MD;  Location: [facility];  Service: Open Heart Surgery;;    Current Medications: Current Meds  Medication Sig   Acetaminophen (TYLENOL 8 HOUR PO) Take by mouth.   ALPRAZolam (XANAX) 0.25 MG tablet Take 0.125 mg by mouth 2 (two) times daily as needed for anxiety.   apixaban (ELIQUIS) 5 MG TABS tablet Take 1 tablet (5 mg total) by mouth 2 (two) times daily.   citalopram (CELEXA) 10 MG tablet Take 1 tablet (10 mg total) by mouth daily.   cyanocobalamin (,VITAMIN B-12,) 1000 MCG/ML injection Inject 1,000 mcg into the muscle every 30 (thirty) days.   ferrous sulfate 325 (65 FE) MG tablet  Take 325 mg by mouth every other day.   Multiple Vitamin (MULTIVITAMIN WITH MINERALS) TABS tablet Take 1 tablet by mouth daily.   SYNTHROID 125 MCG tablet Take 125 mcg by mouth every morning.   [DISCONTINUED] furosemide (LASIX) 40 MG tablet Start on 12/08/2020. Take 40 mg (one tablet) in the morning daily and take 40 mg (one tablet) every other night.   [DISCONTINUED] losartan (COZAAR) 25 MG tablet Take 1 tablet (25 mg total) by mouth daily.   [DISCONTINUED] metoprolol succinate (TOPROL-XL) 50 MG 24 hr tablet Take 1 tablet (50 mg total) by mouth in the morning and at bedtime. Take with or immediately following a meal.     Allergies:   Patient has no known allergies.   Social History   Socioeconomic History   Marital status: Widowed    Spouse name: Not on file   Number of children: Not on file   Years of education: Not on file   Highest education level: Not on file  Occupational History   Not on file  Tobacco Use   Smoking status: Never   Smokeless tobacco: Never  Substance and Sexual Activity   Alcohol use: Yes   Drug use: Never   Sexual activity: Not on file  Other Topics Concern   Not on file  Social History Narrative   Not on file   Social Determinants of Health   Financial Resource Strain: Not on file  Food Insecurity: Not on file  Transportation Needs: Not on file  Physical Activity: Not on file  Stress: Not on file  Social Connections: Not on file     Family History: The patient's family history includes Hypertension in her father.  ROS:   Review of Systems  Constitution: Negative for decreased appetite, fever and weight gain.  HENT: Negative for congestion, ear discharge, hoarse voice and sore throat.   Eyes: Negative for discharge, redness, vision loss in right eye and visual halos.  Cardiovascular: Negative for chest pain, dyspnea on exertion, leg swelling, orthopnea and palpitations.  Respiratory: Negative for cough, hemoptysis, shortness of breath and  snoring.   Endocrine: Negative for heat intolerance and polyphagia.  Hematologic/Lymphatic: Negative for bleeding problem. Does not bruise/bleed easily.  Skin: Negative for flushing, nail changes, rash and suspicious lesions.  Musculoskeletal: Negative for arthritis, joint pain, muscle cramps, myalgias, neck pain and stiffness.  Gastrointestinal: Negative for abdominal pain, bowel incontinence, diarrhea and excessive appetite.  Genitourinary: Negative for decreased libido, genital sores and incomplete emptying.  Neurological: Negative for brief paralysis, focal weakness, headaches and loss of balance.  Psychiatric/Behavioral: Negative for altered mental status, depression and suicidal ideas.  Allergic/Immunologic: Negative for HIV exposure and persistent infections.    EKGs/Labs/Other Studies Reviewed:    The following studies were reviewed today:   EKG:  None today  Echo 12/04/2020 IMPRESSIONS   1. Left ventricular ejection fraction, by estimation, is 35 to 40%. The left ventricle has moderately decreased function. The left ventricle has  no regional wall motion abnormalities. Left ventricular diastolic parameters are indeterminate.   2. Right ventricular systolic function is moderately reduced. The right ventricular size is mildly enlarged. There is mildly elevated pulmonary artery systolic pressure. The estimated right ventricular systolic pressure is [id number] mmHg.   3. Left atrial size was severely dilated.   4. Right atrial size was severely dilated.   5. The mitral valve is normal in structure. Severe mitral valve  regurgitation. No evidence of mitral stenosis.   6. Tricuspid valve regurgitation is moderate.   7. The aortic valve is calcified. Aortic valve regurgitation is mild.  Mild to moderate aortic valve sclerosis/calcification is present, without  any evidence of aortic stenosis.   8. Pulmonic valve regurgitation is moderate.   9. Aortic dilatation noted and root/ascending  aorta has been  repaired/replaced. There is mild dilatation of the ascending aorta,  measuring 40 mm.  10. The inferior vena cava is normal in size with greater than 50%  respiratory variability, suggesting right atrial pressure of 3 mmHg.   FINDINGS   Left Ventricle: Left ventricular ejection fraction, by estimation, is 35  to 40%. The left ventricle has moderately decreased function. The left  ventricle has no regional wall motion abnormalities. The left ventricular  internal cavity size was normal in  size. There is no left ventricular hypertrophy. Left ventricular diastolic  parameters are indeterminate.      LV Wall Scoring:  The mid and distal inferior wall and mid inferolateral segment are  akinetic.   Right Ventricle: The right ventricular size is mildly enlarged. No  increase in right ventricular wall thickness. Right ventricular systolic  function is moderately reduced. There is mildly elevated pulmonary artery  systolic pressure. The tricuspid  regurgitant velocity is 2.32 m/s, and with an assumed right atrial  pressure of 15 mmHg, the estimated right ventricular systolic pressure is  [id number] mmHg.   Left Atrium: Left atrial size was severely dilated.   Right Atrium: Right atrial size was severely dilated.   Pericardium: There is no evidence of pericardial effusion.   Mitral Valve: The mitral valve is normal in structure. Severe mitral valve  regurgitation, with posteriorly-directed jet. No evidence of mitral valve  stenosis.   Tricuspid Valve: The tricuspid valve is normal in structure. Tricuspid  valve regurgitation is moderate . No evidence of tricuspid stenosis.   Aortic Valve: The aortic valve is calcified. Aortic valve regurgitation is  mild. Mild to moderate aortic valve sclerosis/calcification is present,  without any evidence of aortic stenosis.   Pulmonic Valve: The pulmonic valve was normal in structure. Pulmonic valve  regurgitation is moderate. No  evidence of pulmonic stenosis.   Aorta: Aortic dilatation noted and the aortic root/ascending aorta has  been repaired/replaced. There is mild dilatation of the ascending aorta,  measuring 40 mm.   Venous: The inferior vena cava is normal in size with greater than 50%  respiratory variability, suggesting right atrial pressure of 3 mmHg.   IAS/Shunts: No atrial level shunt detected by color flow Doppler.   Recent Labs: 07/27/2020: NT-Pro BNP 1,897 11/21/2020: TSH 1.530 12/03/2020: ALT 11 12/05/2020: Magnesium 1.8 12/06/2020: BUN 15; Creatinine, Ser 1.07; Hemoglobin 16.9; Platelets 223; Potassium 3.7; Sodium 131  Recent Lipid Panel    Component Value Date/Time   CHOL 154 12/04/2020 0422   TRIG 72  12/04/2020 0422   HDL 46 12/04/2020 0422   CHOLHDL 3.3 12/04/2020 0422   VLDL 14 12/04/2020 0422   LDLCALC 94 12/04/2020 0422    Physical Exam:    VS:  BP 136/78 (BP Location: Right Arm, Patient Position: Sitting, Cuff Size: Large)   Pulse 70   Ht 5\' 7"  (1.702 m)   Wt 206 lb (93.4 kg)   BMI 32.26 kg/m     Wt Readings from Last 3 Encounters:  01/02/21 206 lb (93.4 kg)  07/27/20 206 lb 3.2 oz (93.5 kg)  06/09/20 201 lb (91.2 kg)     GEN: Well nourished, well developed in no acute distress HEENT: Normal NECK: No JVD; No carotid bruits LYMPHATICS: No lymphadenopathy CARDIAC: S1S2 noted,RRR, no murmurs, rubs, gallops RESPIRATORY:  Clear to auscultation without rales, wheezing or rhonchi  ABDOMEN: Soft, non-tender, non-distended, +bowel sounds, no guarding. EXTREMITIES: No edema, No cyanosis, no clubbing MUSCULOSKELETAL:  No deformity  SKIN: Warm and dry NEUROLOGIC:  Alert and oriented x 3, non-focal PSYCHIATRIC:  Normal affect, good insight  ASSESSMENT:    1. Depressed left ventricular ejection fraction   2. Medication management   3. Moderate pulmonary valve insufficiency   4. Moderate tricuspid insufficiency   5. Severe mitral regurgitation   6. Persistent atrial  fibrillation ([place])   7. TIA (transient ischemic attack)   8. Obesity (BMI 30-39.9)    PLAN:     Clinically she appears to be euvolemic.  Continue her diuretics at this time.  In terms of her cardiomyopathy keep the losartan 25 mg daily along with the Toprol-XL.  I do believe that her atrial fibrillation may be playing a role here unfortunately patient prefer not to pursue any rhythm control strategy.  Interval atrial fibrillation continue Eliquis and metoprolol for rate control.  She really looks the best she has ever been since I have been following the patient.  She is not compliant with all her medication and clinically appears to be doing well. She is experiencing some back pain which is currently discussed treatment with her primary care doctor in which she will see time this month.  She does not like to drive to [facility] and gives has a lot of anxiety therefore she would like to get back to seeing a provider in [place] and will be transitioning her care to Dr. Agustin Cree.  The patient is in agreement with the above plan. The patient left the office in stable condition.  The patient will follow up in   Medication Adjustments/Labs and Tests Ordered: Current medicines are reviewed at length with the patient today.  Concerns regarding medicines are outlined above.  Orders Placed This Encounter  Procedures   Basic Metabolic Panel (BMET)   Magnesium   No orders of the defined types were placed in this encounter.   Patient Instructions  Medication Instructions:  Your physician recommends that you continue on your current medications as directed. Please refer to the Current Medication list given to you today.  *If you need a refill on your cardiac medications before your next appointment, please call your pharmacy*   Lab Work: Your physician recommends that you return for lab work in:  TODAY: BMET, [place] If you have labs (blood work) drawn today and your tests are completely  normal, you will receive your results only by: [place] (if you have MyChart) OR A paper copy in the mail If you have any lab test that is abnormal or we need to change your treatment,  we will call you to review the results.   Testing/Procedures: None   Follow-Up: At [facility], you and your health needs are our priority.  As part of our continuing mission to provide you with exceptional heart care, we have created designated Provider Care Teams.  These Care Teams include your primary Cardiologist (physician) and Advanced Practice Providers (APPs -  Physician Assistants and Nurse Practitioners) who all work together to provide you with the care you need, when you need it.  We recommend signing up for the patient portal called "MyChart".  Sign up information is provided on this After Visit Summary.  MyChart is used to connect with patients for Virtual Visits (Telemedicine).  Patients are able to view lab/test results, encounter notes, upcoming appointments, etc.  Non-urgent messages can be sent to your provider as well.   To learn more about what you can do with MyChart, go to [email or web address].    Your next appointment:   3 month(s)  The format for your next appointment:   In Person  Provider:   Jenne Campus, MD    Other Instructions     Adopting a Healthy Lifestyle.  Know what a healthy weight is for you (roughly BMI <25) and aim to maintain this   Aim for 7+ servings of fruits and vegetables daily   65-80+ fluid ounces of water or unsweet tea for healthy kidneys   Limit to max 1 drink of alcohol per day; avoid smoking/tobacco   Limit animal fats in diet for cholesterol and heart health - choose grass fed whenever available   Avoid highly processed foods, and foods high in saturated/trans fats   Aim for low stress - take time to unwind and care for your mental health   Aim for 150 min of moderate intensity exercise weekly for heart health, and  weights twice weekly for bone health   Aim for 7-9 hours of sleep daily   When it comes to diets, agreement about the perfect plan isnt easy to find, even among the experts. Experts at the [employer] developed an idea known as the Healthy Eating Plate. Just imagine a plate divided into logical, healthy portions.   The emphasis is on diet quality:   Load up on vegetables and fruits - one-half of your plate: Aim for color and variety, and remember that potatoes dont count.   Go for whole grains - one-quarter of your plate: Whole wheat, barley, wheat berries, quinoa, oats, brown rice, and foods made with them. If you want pasta, go with whole wheat pasta.   Protein power - one-quarter of your plate: Fish, chicken, beans, and nuts are all healthy, versatile protein sources. Limit red meat.   The diet, however, does go beyond the plate, offering a few other suggestions.   Use healthy plant oils, such as olive, canola, soy, corn, sunflower and peanut. Check the labels, and avoid partially hydrogenated oil, which have unhealthy trans fats.   If youre thirsty, drink water. Coffee and tea are good in moderation, but skip sugary drinks and limit milk and dairy products to one or two daily servings.   The type of carbohydrate in the diet is more important than the amount. Some sources of carbohydrates, such as vegetables, fruits, whole grains, and beans-are healthier than others.   Finally, stay active  Signed, Berniece Salines, DO  01/02/2021 5:17 PM    [facility]

## 2021-01-03 LAB — BASIC METABOLIC PANEL
BUN/Creatinine Ratio: 21 (ref 12–28)
BUN: 17 mg/dL (ref 8–27)
CO2: 27 mmol/L (ref 20–29)
Calcium: 9.4 mg/dL (ref 8.7–10.3)
Chloride: 91 mmol/L — ABNORMAL LOW (ref 96–106)
Creatinine, Ser: 0.82 mg/dL (ref 0.57–1.00)
Glucose: 85 mg/dL (ref 70–99)
Potassium: 4.8 mmol/L (ref 3.5–5.2)
Sodium: 135 mmol/L (ref 134–144)
eGFR: 70 mL/min/{1.73_m2} (ref >59)

## 2021-01-03 LAB — MAGNESIUM: Magnesium: 2 mg/dL (ref 1.6–2.3)

## 2021-01-16 ENCOUNTER — Encounter: Payer: Self-pay | Admitting: Family Medicine

## 2021-01-16 ENCOUNTER — Ambulatory Visit (INDEPENDENT_AMBULATORY_CARE_PROVIDER_SITE_OTHER): Payer: Medicare Other | Admitting: Family Medicine

## 2021-01-16 ENCOUNTER — Other Ambulatory Visit: Payer: Self-pay

## 2021-01-16 VITALS — BP 140/86 | HR 89 | Temp 97.5°F | Ht 67.0 in | Wt 208.0 lb

## 2021-01-16 DIAGNOSIS — Z9981 Dependence on supplemental oxygen: Secondary | ICD-10-CM

## 2021-01-16 DIAGNOSIS — M545 Low back pain, unspecified: Secondary | ICD-10-CM

## 2021-01-16 DIAGNOSIS — G8929 Other chronic pain: Secondary | ICD-10-CM | POA: Diagnosis not present

## 2021-01-16 DIAGNOSIS — L03114 Cellulitis of left upper limb: Secondary | ICD-10-CM

## 2021-01-16 MED ORDER — DOXYCYCLINE HYCLATE 100 MG PO TABS: 100.0000 mg | ORAL_TABLET | Freq: Two times a day (BID) | ORAL | 0 refills | Status: AC

## 2021-01-16 MED ORDER — HYDROCODONE-ACETAMINOPHEN 5-325 MG PO TABS: ORAL_TABLET | ORAL | 0 refills | Status: DC

## 2021-01-16 NOTE — Progress Notes (Signed)
New Patient Office Visit  Subjective:  Patient ID: Kaitlyn Rodriguez, female    DOB: May 06, 1933  Age: 87 y.o. MRN: [id number]  CC:  Chief Complaint  Patient presents with   Establish Care    NP/establish care back pains, check left elbow.     HPI Kaitlyn Rodriguez presents for for establishment of care with a history of hypertension, CHF, atrial failure, hypothyroidism, anxiety and chronic lower back pain.  Presents today for treatment of lower back pain that is chronic.  History of severe arthritis in her lower back.  She has limited mobility that requires assistance with a walker or wheelchair.  She can only stand for short periods of time.  She has worked with physical therapy.  Lower back pain is in the mid back area.  There is no radiation.  Denies paresthesias in the groin area.  Aleve helps but she was recently started on Eliquis.  She has used Xanax in the past for her anxiety.  She was given a prescription for Celexa but has not started it yet.  She has a sore on her left elbow over the past few weeks has been draining purulent fluid after soaks.  Past Medical History:  Diagnosis Date   Anxiety disorder, unspecified    Benign essential hypertension    Dissection of unspecified site of aorta ([place]) 11/2018   Heart failure, unspecified ([place])    History of aortic valve replacement    Hypothyroidism, unspecified    Unspecified osteoarthritis, unspecified site    Vitamin B12 deficiency anemia     Past Surgical History:  Procedure Laterality Date   ABDOMINAL HYSTERECTOMY     at age 49   REPLACEMENT ASCENDING AORTA N/A 12/20/2018   Procedure: REPAIR OF TYPE A AORTIC DISSECTION USING HEMASHIELD PLATINUM 28MM AND 32MM GRAFT;  Surgeon: Wonda Olds, MD;  Location: [facility];  Service: Open Heart Surgery;  Laterality: N/A;   STERNAL CLOSURE  12/20/2018   Procedure: STERNAL PLATING;  Surgeon: Wonda Olds, MD;  Location: [facility] OR;  Service: Open Heart Surgery;;    Family History   Problem Relation Age of Onset   Hypertension Father     Social History   Socioeconomic History   Marital status: Widowed    Spouse name: Not on file   Number of children: Not on file   Years of education: Not on file   Highest education level: Not on file  Occupational History   Not on file  Tobacco Use   Smoking status: Never   Smokeless tobacco: Never  Vaping Use   Vaping Use: Never used  Substance and Sexual Activity   Alcohol use: Never   Drug use: Never   Sexual activity: Not Currently  Other Topics Concern   Not on file  Social History Narrative   Not on file   Social Determinants of Health   Financial Resource Strain: Not on file  Food Insecurity: Not on file  Transportation Needs: Not on file  Physical Activity: Not on file  Stress: Not on file  Social Connections: Not on file  Intimate Partner Violence: Not on file    ROS Review of Systems  Constitutional:  Negative for chills, diaphoresis, fatigue, fever and unexpected weight change.  HENT: Negative.    Eyes:  Negative for photophobia and visual disturbance.  Respiratory:  Positive for shortness of breath. Negative for chest tightness and wheezing.   Cardiovascular:  Positive for palpitations. Negative for chest pain.  Gastrointestinal:  Negative.   Genitourinary: Negative.   Musculoskeletal:  Positive for arthralgias, back pain and gait problem.  Allergic/Immunologic: Negative for immunocompromised state.  Neurological:  Negative for speech difficulty and weakness.  Psychiatric/Behavioral:  The patient is nervous/anxious.    Objective:   Today's Vitals: BP 140/86 (BP Location: Right Arm, Patient Position: Sitting, Cuff Size: Large)   Pulse 89   Temp (!) 97.5 F (36.4 C) (Temporal)   Ht 5\' 7"  (1.702 m)   Wt 208 lb (94.3 kg)   SpO2 96%   BMI 32.58 kg/m   Physical Exam Vitals and nursing note reviewed.  Constitutional:      General: She is not in acute distress.    Appearance: Normal  appearance. She is obese. She is diaphoretic. She is not ill-appearing or toxic-appearing.  HENT:     Head: Normocephalic and atraumatic.     Right Ear: External ear normal.     Left Ear: External ear normal.  Eyes:     General: No scleral icterus.       Right eye: No discharge.        Left eye: No discharge.     Extraocular Movements: Extraocular movements intact.     Conjunctiva/sclera: Conjunctivae normal.  Cardiovascular:     Rate and Rhythm: Rhythm irregularly irregular.  Pulmonary:     Effort: Pulmonary effort is normal.     Breath sounds: Examination of the left-lower field reveals rhonchi. Rhonchi present. No wheezing.  Abdominal:     General: Bowel sounds are normal.  Musculoskeletal:     Right lower leg: No edema.     Left lower leg: No edema.  Skin:    General: Skin is warm.       Neurological:     Mental Status: She is alert and oriented to person, place, and time.  Psychiatric:        Behavior: Behavior normal.    Assessment & Plan:   Problem List Items Addressed This Visit       Other   Dependence on supplemental oxygen   Other Visit Diagnoses     Chronic midline low back pain without sciatica    -  Primary   Relevant Medications   HYDROcodone-acetaminophen (NORCO) 5-325 MG tablet   Cellulitis of left upper extremity       Relevant Medications   doxycycline (VIBRA-TABS) 100 MG tablet       Outpatient Encounter Medications as of 01/16/2021  Medication Sig   apixaban (ELIQUIS) 5 MG TABS tablet Take 1 tablet (5 mg total) by mouth 2 (two) times daily.   citalopram (CELEXA) 10 MG tablet Take 1 tablet (10 mg total) by mouth daily.   cyanocobalamin (,VITAMIN B-12,) 1000 MCG/ML injection Inject 1,000 mcg into the muscle every 30 (thirty) days.   doxycycline (VIBRA-TABS) 100 MG tablet Take 1 tablet (100 mg total) by mouth 2 (two) times daily for 10 days. With food.   ferrous sulfate 325 (65 FE) MG tablet Take 325 mg by mouth every other day.   furosemide  (LASIX) 40 MG tablet Start on 12/08/2020. Take 40 mg (one tablet) in the morning daily and take 40 mg (one tablet) every other night.   HYDROcodone-acetaminophen (NORCO) 5-325 MG tablet May take one every 12 hours as needed for chronic back pain.   losartan (COZAAR) 25 MG tablet Take 1 tablet (25 mg total) by mouth daily.   metoprolol succinate (TOPROL-XL) 50 MG 24 hr tablet Take 1 tablet (50 mg total) by  mouth in the morning and at bedtime. Take with or immediately following a meal.   Multiple Vitamin (MULTIVITAMIN WITH MINERALS) TABS tablet Take 1 tablet by mouth daily.   SYNTHROID 125 MCG tablet Take 125 mcg by mouth every morning.   [DISCONTINUED] Acetaminophen (TYLENOL 8 HOUR PO) Take by mouth.   [DISCONTINUED] ALPRAZolam (XANAX) 0.25 MG tablet Take 0.125 mg by mouth 2 (two) times daily as needed for anxiety.   No facility-administered encounter medications on file as of 01/16/2021.    Follow-up: Return in about 1 month (around 02/15/2021).   Will start [place] every 12.  Drowsy precautions given along with increased fall risk.  She is aware that she will have to stop the Xanax.  These 2 medications cannot be maxed especially in her age group.  She will start the Celexa.  We will start Doxy 100 twice daily for elbow and recheck.  Libby Maw, MD

## 2021-01-23 ENCOUNTER — Inpatient Hospital Stay: Payer: Medicare Other | Admitting: Adult Health

## 2021-03-06 ENCOUNTER — Ambulatory Visit: Payer: Medicare Other | Admitting: Family Medicine

## 2021-03-23 ENCOUNTER — Telehealth: Payer: Self-pay | Admitting: Family Medicine

## 2021-03-23 DIAGNOSIS — D519 Vitamin B12 deficiency anemia, unspecified: Secondary | ICD-10-CM

## 2021-03-23 MED ORDER — CYANOCOBALAMIN 1000 MCG/ML IJ SOLN: 1000.0000 ug | INTRAMUSCULAR | 3 refills | Status: DC

## 2021-03-23 NOTE — Telephone Encounter (Signed)
What is the name of the medication? cyanocobalamin (,VITAMIN B-12,) 1000 MCG/ML injection [id number]  and syringes  Have you contacted your pharmacy to request a refill? Pt is wanting a refill on this script, he would like to know when this is filled or not.  Tommy(son)  Which pharmacy would you like this sent to? [facility] [address]  [place], [place] [address]  Phone:  [phone]  Fax:  [phone]  DEA #:  [id number]   Patient notified that their request is being sent to the clinical staff for review and that they should receive a call once it is complete. If they do not receive a call within 72 hours they can check with their pharmacy or our office.

## 2021-03-23 NOTE — Telephone Encounter (Signed)
Rx sent in

## 2021-05-23 ENCOUNTER — Other Ambulatory Visit: Payer: Self-pay

## 2021-05-23 ENCOUNTER — Encounter: Payer: Self-pay | Admitting: Cardiology

## 2021-05-23 ENCOUNTER — Ambulatory Visit: Payer: Medicare Other | Admitting: Cardiology

## 2021-05-23 VITALS — BP 142/84 | HR 92 | Ht 67.0 in

## 2021-05-23 DIAGNOSIS — I272 Pulmonary hypertension, unspecified: Secondary | ICD-10-CM

## 2021-05-23 DIAGNOSIS — Z8679 Personal history of other diseases of the circulatory system: Secondary | ICD-10-CM

## 2021-05-23 DIAGNOSIS — I5041 Acute combined systolic (congestive) and diastolic (congestive) heart failure: Secondary | ICD-10-CM

## 2021-05-23 DIAGNOSIS — I351 Nonrheumatic aortic (valve) insufficiency: Secondary | ICD-10-CM

## 2021-05-23 DIAGNOSIS — Z9889 Other specified postprocedural states: Secondary | ICD-10-CM

## 2021-05-23 DIAGNOSIS — E669 Obesity, unspecified: Secondary | ICD-10-CM

## 2021-05-23 DIAGNOSIS — R0989 Other specified symptoms and signs involving the circulatory and respiratory systems: Secondary | ICD-10-CM

## 2021-05-23 DIAGNOSIS — G459 Transient cerebral ischemic attack, unspecified: Secondary | ICD-10-CM

## 2021-05-23 DIAGNOSIS — I340 Nonrheumatic mitral (valve) insufficiency: Secondary | ICD-10-CM

## 2021-05-23 NOTE — Patient Instructions (Addendum)
Medication Instructions:  ?Your physician recommends that you continue on your current medications as directed. Please refer to the Current Medication list given to you today.  ?*If you need a refill on your cardiac medications before your next appointment, please call your pharmacy* ? ? ?Lab Work: ?Your physician recommends that you return for lab work in:  ?In [place]: CBC ?If you have labs (blood work) drawn today and your tests are completely normal, you will receive your results only by: ?MyChart Message (if you have MyChart) OR ?A paper copy in the mail ?If you have any lab test that is abnormal or we need to change your treatment, we will call you to review the results. ? ? ?Testing/Procedures: ?None ? ? ?Follow-Up: ?At [facility], you and your health needs are our priority.  As part of our continuing mission to provide you with exceptional heart care, we have created designated Provider Care Teams.  These Care Teams include your primary Cardiologist (physician) and Advanced Practice Providers (APPs -  Physician Assistants and Nurse Practitioners) who all work together to provide you with the care you need, when you need it. ? ?We recommend signing up for the patient portal called "MyChart".  Sign up information is provided on this After Visit Summary.  MyChart is used to connect with patients for Virtual Visits (Telemedicine).  Patients are able to view lab/test results, encounter notes, upcoming appointments, etc.  Non-urgent messages can be sent to your provider as well.   ?To learn more about what you can do with MyChart, go to [email or web address].   ? ?Your next appointment:   ?6 month(s) ? ?The format for your next appointment:   ?In Person ? ?Provider:   ?Thomasene Ripple, DO   ? ? ?Other Instructions ?  ?

## 2021-05-25 NOTE — Progress Notes (Signed)
?Cardiology Office Note:   ? ?Date:  05/25/2021  ? ?ID:  Kaitlyn Rodriguez, DOB 03/03/1933, MRN [id number] ? ?PCP:  Mliss Sax, MD  ?Cardiologist:  Thomasene Ripple, DO  ?Electrophysiologist:  None  ? ?Referring MD: Elisabeth Most, FNP  ? ? ? ?History of Present Illness:   ? ?Kaitlyn Rodriguez is a 88 y.o. female with a hx of repair of type a dissection in 2020, permanent atrial fibrillation on anticoagulation with Eliquis 5 mg twice daily, recently diagnosed depressed ejection fraction, history of CVA, presents today for follow-up visit. ? ?Patient is here today with her son.  They have had a rough day today, to see me.  Their car broke down and they both had to wait for the patient's brother to bring his card.  She was a lot of stress by the time she got to the office.  Overall she tells me she has been doing well.  She is happy with all of the assistance she is getting from present. ? ?She recently celebrated her 51 birthday and I wished the patient happy belated birthday. ? ?No chest pain, no shortness of breath. ? ?Past Medical History:  ?Diagnosis Date  ? Anxiety disorder, unspecified   ? Benign essential hypertension   ? Dissection of unspecified site of aorta (HCC) 11/2018  ? Heart failure, unspecified (HCC)   ? History of aortic valve replacement   ? Hypothyroidism, unspecified   ? Unspecified osteoarthritis, unspecified site   ? Vitamin B12 deficiency anemia   ? ? ?Past Surgical History:  ?Procedure Laterality Date  ? ABDOMINAL HYSTERECTOMY    ? at age 18  ? REPLACEMENT ASCENDING AORTA N/A 12/20/2018  ? Procedure: REPAIR OF TYPE A AORTIC DISSECTION USING HEMASHIELD PLATINUM AND GRAFT;  Surgeon: Linden Dolin, MD;  Location: [facility] OR;  Service: Open Heart Surgery;  Laterality: N/A;  ? STERNAL CLOSURE  12/20/2018  ? Procedure: STERNAL PLATING;  Surgeon: Linden Dolin, MD;  Location: [facility] OR;  Service: Open Heart Surgery;;  ? ? ?Current Medications: ?Current Meds  ?Medication Sig  ? ALPRAZolam  (XANAX) 0.25 MG tablet Take 0.25 mg by mouth 2 times daily at 12 noon and 4 pm.  ? apixaban (ELIQUIS) 5 MG TABS tablet Take 1 tablet (5 mg total) by mouth 2 (two) times daily.  ? cyanocobalamin (,VITAMIN B-12,) 1000 MCG/ML injection Inject 1 mL (1,000 mcg total) into the muscle every 30 (thirty) days.  ? ferrous sulfate 325 (65 FE) MG tablet Take 325 mg by mouth every other day.  ? furosemide (LASIX) 40 MG tablet Start on 12/08/2020. Take 40 mg (one tablet) in the morning daily and take 40 mg (one tablet) every other night.  ? losartan (COZAAR) 25 MG tablet Take 1 tablet (25 mg total) by mouth daily.  ? metoprolol succinate (TOPROL-XL) 50 MG 24 hr tablet Take 1 tablet (50 mg total) by mouth in the morning and at bedtime. Take with or immediately following a meal.  ? Multiple Vitamin (MULTIVITAMIN WITH MINERALS) TABS tablet Take 1 tablet by mouth daily.  ? SYNTHROID 125 MCG tablet Take 125 mcg by mouth every morning.  ?  ? ?Allergies:   Patient has no known allergies.  ? ?Social History  ? ?Socioeconomic History  ? Marital status: Widowed  ?  Spouse name: Not on file  ? Number of children: Not on file  ? Years of education: Not on file  ? Highest education level: Not on file  ?  Occupational History  ? Not on file  ?Tobacco Use  ? Smoking status: Never  ? Smokeless tobacco: Never  ?Vaping Use  ? Vaping Use: Never used  ?Substance and Sexual Activity  ? Alcohol use: Never  ? Drug use: Never  ? Sexual activity: Not Currently  ?Other Topics Concern  ? Not on file  ?Social History Narrative  ? Not on file  ? ?Social Determinants of Health  ? ?Financial Resource Strain: Not on file  ?Food Insecurity: Not on file  ?Transportation Needs: Not on file  ?Physical Activity: Not on file  ?Stress: Not on file  ?Social Connections: Not on file  ?  ? ?Family History: ?The patient's family history includes Hypertension in her father. ? ?ROS:   ?Review of Systems  ?Constitution: Negative for decreased appetite, fever and weight gain.   ?HENT: Negative for congestion, ear discharge, hoarse voice and sore throat.   ?Eyes: Negative for discharge, redness, vision loss in right eye and visual halos.  ?Cardiovascular: Negative for chest pain, dyspnea on exertion, leg swelling, orthopnea and palpitations.  ?Respiratory: Negative for cough, hemoptysis, shortness of breath and snoring.   ?Endocrine: Negative for heat intolerance and polyphagia.  ?Hematologic/Lymphatic: Negative for bleeding problem. Does not bruise/bleed easily.  ?Skin: Negative for flushing, nail changes, rash and suspicious lesions.  ?Musculoskeletal: Negative for arthritis, joint pain, muscle cramps, myalgias, neck pain and stiffness.  ?Gastrointestinal: Negative for abdominal pain, bowel incontinence, diarrhea and excessive appetite.  ?Genitourinary: Negative for decreased libido, genital sores and incomplete emptying.  ?Neurological: Negative for brief paralysis, focal weakness, headaches and loss of balance.  ?Psychiatric/Behavioral: Negative for altered mental status, depression and suicidal ideas.  ?Allergic/Immunologic: Negative for HIV exposure and persistent infections.  ? ? ?EKGs/Labs/Other Studies Reviewed:   ? ?The following studies were reviewed today: ? ? ?EKG: None today ? ?Echo 12/04/2020 IMPRESSIONS  ? 1. Left ventricular ejection fraction, by estimation, is 35 to 40%. The left ventricle has moderately decreased function. The left ventricle has  ?no regional wall motion abnormalities. Left ventricular diastolic parameters are indeterminate.  ? 2. Right ventricular systolic function is moderately reduced. The right ventricular size is mildly enlarged. There is mildly elevated pulmonary artery systolic pressure. The estimated right ventricular systolic pressure is 36.5 mmHg.  ? 3. Left atrial size was severely dilated.  ? 4. Right atrial size was severely dilated.  ? 5. The mitral valve is normal in structure. Severe mitral valve  ?regurgitation. No evidence of mitral  stenosis.  ? 6. Tricuspid valve regurgitation is moderate.  ? 7. The aortic valve is calcified. Aortic valve regurgitation is mild.  ?Mild to moderate aortic valve sclerosis/calcification is present, without  ?any evidence of aortic stenosis.  ? 8. Pulmonic valve regurgitation is moderate.  ? 9. Aortic dilatation noted and root/ascending aorta has been  ?repaired/replaced. There is mild dilatation of the ascending aorta,  ?measuring 40 mm.  ?10. The inferior vena cava is normal in size with greater than 50%  ?respiratory variability, suggesting right atrial pressure of 3 mmHg.  ? ?FINDINGS  ? Left Ventricle: Left ventricular ejection fraction, by estimation, is 35  ?to 40%. The left ventricle has moderately decreased function. The left  ?ventricle has no regional wall motion abnormalities. The left ventricular  ?internal cavity size was normal in  ?size. There is no left ventricular hypertrophy. Left ventricular diastolic  ?parameters are indeterminate.  ? ?   ?LV Wall Scoring:  ?The mid and distal inferior wall and mid  inferolateral segment are  ?akinetic.  ? ?Right Ventricle: The right ventricular size is mildly enlarged. No  ?increase in right ventricular wall thickness. Right ventricular systolic  ?function is moderately reduced. There is mildly elevated pulmonary artery  ?systolic pressure. The tricuspid  ?regurgitant velocity is 2.32 m/s, and with an assumed right atrial  ?pressure of 15 mmHg, the estimated right ventricular systolic pressure is  ?36.5 mmHg.  ? ?Left Atrium: Left atrial size was severely dilated.  ? ?Right Atrium: Right atrial size was severely dilated.  ? ?Pericardium: There is no evidence of pericardial effusion.  ? ?Mitral Valve: The mitral valve is normal in structure. Severe mitral valve  ?regurgitation, with posteriorly-directed jet. No evidence of mitral valve  ?stenosis.  ? ?Tricuspid Valve: The tricuspid valve is normal in structure. Tricuspid  ?valve regurgitation is moderate . No  evidence of tricuspid stenosis.  ? ?Aortic Valve: The aortic valve is calcified. Aortic valve regurgitation is  ?mild. Mild to moderate aortic valve sclerosis/calcification is present,  ?without any evidence

## 2021-06-05 ENCOUNTER — Other Ambulatory Visit: Payer: Self-pay | Admitting: Cardiology

## 2021-06-05 NOTE — Telephone Encounter (Signed)
Prescription refill request for Eliquis received. ?Indication:Afib ?Last office visit:3/23 ?Scr:0.8 ?Age: 88 ?Weight:94.3 kg ? ?Prescription refilled ? ?

## 2021-06-06 ENCOUNTER — Telehealth: Payer: Self-pay | Admitting: Cardiology

## 2021-06-06 NOTE — Telephone Encounter (Signed)
Spoke with patient of Dr. Servando Salina. She reports her stomach doesn't feel right - She is not constipated, no diarrhea, no vomiting. She is having normal BMs. She had cramping in legs last week. She states "I can tell something isn't right and I don't want it to go too far". She also reports feeling nervous.  ? ?She reports urinating more often -- explained she takes lasix 2 one day and 1 tab the next. She is bothered that she has to "run to the bathroom". She reports take 2 per day, she is urinating a lot more. She would like to only take 1 tablet daily.  ? ?She reports weight is the same.  ? ?She is due for CBC but has not been collected.  ?Advised will check with Dr. Servando Salina to see if any other labs are needed.  ?She was notified that Dr. Servando Salina is off this afternoon ? ?She is also asking for refills of xanax, ferrous sulfate -- advised PCP should refill those  ?She states her PCP has retired  ?

## 2021-06-06 NOTE — Telephone Encounter (Signed)
New Message: ? ? ? ? ? ?Patient says she thinks some of her medicine is causing her to have kidney problems. She said she did not know which medicine was causing it. ?

## 2021-06-09 MED ORDER — FUROSEMIDE 40 MG PO TABS: 40.0000 mg | ORAL_TABLET | Freq: Every day | ORAL | 3 refills | Status: DC

## 2021-06-09 MED ORDER — POTASSIUM CHLORIDE CRYS ER 20 MEQ PO TBCR: 20.0000 meq | EXTENDED_RELEASE_TABLET | Freq: Every day | ORAL | 3 refills | Status: DC

## 2021-06-09 NOTE — Telephone Encounter (Signed)
Called pt to relay Dr. Terrial Rhodes message. Pt wants to know can she take Lasix once a day since she takes it at 12 and 3. 2 weeks she has only been taking it once daily. "It just makes me feel bad." Offered pt a solution to take her first dose at 8am and her second dose at 3. She did not want to do that.  Pt would prefer to take one Lasix pill a day. Pt also complaining of leg cramps. "Well since I've been taking the one a day I'm not having cramps." Will get message to Dr. Harriet Masson for review.   ?

## 2021-06-09 NOTE — Telephone Encounter (Signed)
After speaking with Dr. Servando Salina she is okay with pt continuing Lasix 40 mg once daily. Potassium added to pt's medication regimen due to not being on any. Prescription sent to pharmacy. Pt verbalized understanding and thanked me for calling her back.  ?

## 2021-06-12 ENCOUNTER — Telehealth: Payer: Self-pay | Admitting: Family Medicine

## 2021-06-12 MED ORDER — FERROUS SULFATE 325 (65 FE) MG PO TABS: 325.0000 mg | ORAL_TABLET | ORAL | 0 refills | Status: DC

## 2021-06-12 NOTE — Telephone Encounter (Signed)
Caller Name: Konrad Dolores, son ?Call back phone #: [phone]  ? ?MEDICATION(S): ferrous sulfate 325 (65 FE) MG tablet  ? ? ?Days of Med Remaining: 4 days ? ?Has the patient contacted their pharmacy (YES/NO)?  yes ?IF YES, when and what did the pharmacy advise? Changed providers and now needs prescribed by Dr. Ethelene Hal ? ? ?Preferred Pharmacy:  ?[facility] [address] [place] Phone:  [phone]  ?Fax:  [phone]  ?  ? ?

## 2021-06-14 ENCOUNTER — Other Ambulatory Visit: Payer: Self-pay

## 2021-06-14 DIAGNOSIS — Z79899 Other long term (current) drug therapy: Secondary | ICD-10-CM

## 2021-06-15 LAB — CBC WITH DIFFERENTIAL/PLATELET
Basophils Absolute: 0 10*3/uL (ref 0.0–0.2)
Basos: 1 %
EOS (ABSOLUTE): 0 10*3/uL (ref 0.0–0.4)
Eos: 1 %
Hematocrit: 44 % (ref 34.0–46.6)
Hemoglobin: 14.9 g/dL (ref 11.1–15.9)
Immature Grans (Abs): 0 10*3/uL (ref 0.0–0.1)
Immature Granulocytes: 0 %
Lymphocytes Absolute: 0.9 10*3/uL (ref 0.7–3.1)
Lymphs: 12 %
MCH: 32.3 pg (ref 26.6–33.0)
MCHC: 33.9 g/dL (ref 31.5–35.7)
MCV: 95 fL (ref 79–97)
Monocytes Absolute: 0.7 10*3/uL (ref 0.1–0.9)
Monocytes: 9 %
Neutrophils Absolute: 5.6 10*3/uL (ref 1.4–7.0)
Neutrophils: 77 %
Platelets: 203 10*3/uL (ref 150–450)
RBC: 4.61 x10E6/uL (ref 3.77–5.28)
RDW: 12.8 % (ref 11.7–15.4)
WBC: 7.3 10*3/uL (ref 3.4–10.8)

## 2021-06-15 LAB — BASIC METABOLIC PANEL
BUN/Creatinine Ratio: 15 (ref 12–28)
BUN: 11 mg/dL (ref 8–27)
CO2: 24 mmol/L (ref 20–29)
Calcium: 9.5 mg/dL (ref 8.7–10.3)
Chloride: 91 mmol/L — ABNORMAL LOW (ref 96–106)
Creatinine, Ser: 0.74 mg/dL (ref 0.57–1.00)
Glucose: 127 mg/dL — ABNORMAL HIGH (ref 70–99)
Potassium: 4.8 mmol/L (ref 3.5–5.2)
Sodium: 128 mmol/L — ABNORMAL LOW (ref 134–144)
eGFR: 79 mL/min/{1.73_m2} (ref >59)

## 2021-06-15 LAB — MAGNESIUM: Magnesium: 2.5 mg/dL — ABNORMAL HIGH (ref 1.6–2.3)

## 2021-06-16 ENCOUNTER — Telehealth: Payer: Self-pay | Admitting: Cardiology

## 2021-06-16 NOTE — Telephone Encounter (Signed)
Patient following up on lab results.

## 2021-06-16 NOTE — Telephone Encounter (Signed)
Pt son called and would like someone to call them today about pt blood work results  ? ?Best number  [phone] ?

## 2021-06-19 MED ORDER — FUROSEMIDE 40 MG PO TABS: 40.0000 mg | ORAL_TABLET | ORAL | 3 refills | Status: DC

## 2021-06-19 NOTE — Telephone Encounter (Signed)
Patient following up about lab results  ?

## 2021-06-19 NOTE — Telephone Encounter (Signed)
Spoke with pt regarding recent lab work.  ? ?Thomasene Ripple, DO  ?06/15/2021 11:14 AM EDT Back to Top  ?  ?You do have hyponatremia.  Last cut back on the Lasix to every other day.  ? ?Dr. Mallory Shirk recommendations given to pt. Prescription changed. Pt verbalizes understanding. Printed copy of labs sent to pt's home address as pt requested.  ?

## 2021-06-22 NOTE — Telephone Encounter (Signed)
patient has some more questions regarding here labs ?

## 2021-06-22 NOTE — Telephone Encounter (Signed)
Spoke with pt and pt's son aware of labs and  labs have been mailed to pt and to watch fluid and salt intake Pt was recently instructed to decrease Lasix to every other day.Pt to continue to monitor and call back if no improvement./ct ?

## 2021-06-28 ENCOUNTER — Telehealth: Payer: Self-pay | Admitting: Cardiology

## 2021-06-28 NOTE — Telephone Encounter (Signed)
Called patient, advised that since the decrease in Lasix she is very anxious and nervous. I did advise that this was to help her- the decrease in Lasix was due to her sodium level. Patient does seem very anxious on the phone, and did not understand why she does not feel good if her testing was normal. Patient continued to ask if she was going to have a heart attack- I did advise patient unfortunately I could not tell her yes or no, but we would provide the best care we could to help her.  ? ?Patient was advised to communicate her anxiety concerns with her PCP, but patient also requested it be sent to Dr.Tobb, I will do this.  ? ?

## 2021-06-28 NOTE — Telephone Encounter (Signed)
Patient is calling stating around the end of April right after her Lasix was changed she began having issues with feeling anxious/nervous. Does not report any other symptoms. Please advise.  ?

## 2021-07-03 ENCOUNTER — Encounter: Payer: Self-pay | Admitting: Family Medicine

## 2021-07-03 ENCOUNTER — Ambulatory Visit (INDEPENDENT_AMBULATORY_CARE_PROVIDER_SITE_OTHER): Payer: Medicare Other | Admitting: Family Medicine

## 2021-07-03 VITALS — BP 138/68 | HR 72 | Temp 98.1°F | Ht 67.0 in | Wt 210.0 lb

## 2021-07-03 DIAGNOSIS — F418 Other specified anxiety disorders: Secondary | ICD-10-CM

## 2021-07-03 DIAGNOSIS — E039 Hypothyroidism, unspecified: Secondary | ICD-10-CM | POA: Diagnosis not present

## 2021-07-03 DIAGNOSIS — D518 Other vitamin B12 deficiency anemias: Secondary | ICD-10-CM | POA: Diagnosis not present

## 2021-07-03 MED ORDER — ALPRAZOLAM 0.25 MG PO TABS: 0.2500 mg | ORAL_TABLET | Freq: Two times a day (BID) | ORAL | 1 refills | Status: DC | PRN

## 2021-07-03 MED ORDER — CITALOPRAM HYDROBROMIDE 10 MG PO TABS: 10.0000 mg | ORAL_TABLET | Freq: Every day | ORAL | 3 refills | Status: DC

## 2021-07-03 NOTE — Progress Notes (Signed)
? ?Established Patient Office Visit ? ?Subjective   ?Patient ID: Kaitlyn Rodriguez, female    DOB: 06/16/1933  Age: 88 y.o. MRN: [id number] ? ?No chief complaint on file. ? ? ?HPI here for follow-up of anxiety/depression.  Has been taking Xanax regularly for over a year now.  She is accompanied by her son.  Lasix was recently decreased to 4 times daily secondary to hyponatremia and somehow this is upset her. ? ? ? ?Review of Systems  ?Constitutional:  Negative for chills, diaphoresis, malaise/fatigue and weight loss.  ?HENT: Negative.    ?Eyes: Negative.  Negative for blurred vision and double vision.  ?Cardiovascular:  Negative for chest pain.  ?Gastrointestinal:  Negative for abdominal pain.  ?Genitourinary: Negative.   ?Musculoskeletal:  Negative for falls and myalgias.  ?Neurological:  Negative for speech change, loss of consciousness and weakness.  ?Psychiatric/Behavioral:  Positive for depression. Negative for suicidal ideas. The patient is nervous/anxious.   ? ?  07/03/2021  ?  4:54 PM 07/03/2021  ?  4:25 PM 01/16/2021  ?  4:18 PM  ?Depression screen PHQ 2/9  ?Decreased Interest 1 1 0  ?Down, Depressed, Hopeless 1 1 0  ?PHQ - 2 Score 2 2 0  ?Altered sleeping 0  0  ?Tired, decreased energy 3  1  ?Change in appetite 0  0  ?Feeling bad or failure about yourself  0  0  ?Trouble concentrating 0  0  ?Moving slowly or fidgety/restless 0  0  ?Suicidal thoughts 0  0  ?PHQ-9 Score 5  1  ?Difficult doing work/chores Somewhat difficult  Not difficult at all  ? ? ? ?  ?Objective:  ?  ? ?BP 138/68 (BP Location: Left Arm, Patient Position: Sitting, Cuff Size: Large)   Pulse 72   Temp 98.1 ?F (36.7 ?C) (Temporal)   Ht 5\' 7"  (1.702 m)   Wt 210 lb (95.3 kg)   SpO2 94%   BMI 32.89 kg/m?  ? ? ?Physical Exam ?Constitutional:   ?   General: She is not in acute distress. ?   Appearance: Normal appearance. She is obese. She is not ill-appearing, toxic-appearing or diaphoretic.  ?HENT:  ?   Head: Normocephalic and atraumatic.  ?   Right  Ear: External ear normal.  ?   Left Ear: External ear normal.  ?Eyes:  ?   General: No scleral icterus.    ?   Right eye: No discharge.     ?   Left eye: No discharge.  ?   Extraocular Movements: Extraocular movements intact.  ?   Conjunctiva/sclera: Conjunctivae normal.  ?Cardiovascular:  ?   Rate and Rhythm: Normal rate and regular rhythm.  ?Pulmonary:  ?   Effort: Pulmonary effort is normal. No respiratory distress.  ?   Breath sounds: Normal breath sounds. No rhonchi or rales.  ?Abdominal:  ?   General: Bowel sounds are normal.  ?Musculoskeletal:  ?   Cervical back: No rigidity or tenderness.  ?Skin: ?   General: Skin is warm and dry.  ?Neurological:  ?   Mental Status: She is alert and oriented to person, place, and time.  ?Psychiatric:     ?   Mood and Affect: Mood normal.     ?   Behavior: Behavior normal.  ? ? ? ?No results found for any visits on 07/03/21. ? ? ? ?The ASCVD Risk score (Arnett DK, et al., 2019) failed to calculate for the following reasons: ?  The 2019 ASCVD risk  score is only valid for ages 73 to 69 ? ?  ?Assessment & Plan:  ? ?Problem List Items Addressed This Visit   ? ?  ? Endocrine  ? Hypothyroidism  ? Relevant Orders  ? TSH  ?  ? Other  ? Vitamin B12 deficiency anemia - Primary  ? Relevant Orders  ? Vitamin B12  ? Anxiety with depression  ? Relevant Medications  ? citalopram (CELEXA) 10 MG tablet  ? ALPRAZolam (XANAX) 0.25 MG tablet  ? ? ?Return in about 6 weeks (around 08/14/2021).  ?We will start Celexa.  With tolerance may be able to consider Xanax taper.  Rechecking thyroid and B12. ? ?Libby Maw, MD ? ?

## 2021-07-04 ENCOUNTER — Telehealth: Payer: Self-pay | Admitting: Family Medicine

## 2021-07-04 LAB — VITAMIN B12: Vitamin B-12: 344 pg/mL (ref 211–911)

## 2021-07-04 LAB — TSH: TSH: 1.22 u[IU]/mL (ref 0.35–5.50)

## 2021-07-04 NOTE — Telephone Encounter (Signed)
Pt called asking about labs  ?

## 2021-07-05 NOTE — Telephone Encounter (Signed)
Called patient to go over labs, no answer LMTCB. Duplicate message  ?

## 2021-07-06 ENCOUNTER — Telehealth: Payer: Self-pay | Admitting: Family Medicine

## 2021-07-06 NOTE — Telephone Encounter (Signed)
Called and left a message letting pt know to follow up with her PCP in regards to her anxiety.  ?

## 2021-07-06 NOTE — Telephone Encounter (Signed)
Pt is wanting her most recent lab results mailed to her  ?[address] ?[place], [place] [address] ?

## 2021-07-06 NOTE — Telephone Encounter (Signed)
Done

## 2021-07-06 NOTE — Telephone Encounter (Signed)
Error

## 2021-07-10 NOTE — Telephone Encounter (Signed)
Returned patients call to check on her see if she needed to come in for OV. No answer LMTCB ?

## 2021-07-10 NOTE — Telephone Encounter (Signed)
I spoke with pt and sent her through to Nurse Triage.  ?

## 2021-07-10 NOTE — Telephone Encounter (Signed)
Pt said she thinks the new drug she was prescribed Celexa is causing her to be nervous and has diarrhea.  Her son gave her some famotitne  for acid and the diarrhea got worse. Please advise asap ?

## 2021-07-11 NOTE — Telephone Encounter (Signed)
Called patient to check on her, no answer LMTCB.  ?

## 2021-07-13 NOTE — Telephone Encounter (Signed)
Spoke with patient who states that symptoms have gotten much better.

## 2021-08-14 ENCOUNTER — Encounter: Payer: Self-pay | Admitting: Family Medicine

## 2021-08-14 ENCOUNTER — Ambulatory Visit (INDEPENDENT_AMBULATORY_CARE_PROVIDER_SITE_OTHER): Payer: Medicare Other | Admitting: Family Medicine

## 2021-08-14 VITALS — BP 146/92 | HR 98 | Temp 97.6°F | Ht 67.0 in | Wt 216.0 lb

## 2021-08-14 DIAGNOSIS — E611 Iron deficiency: Secondary | ICD-10-CM

## 2021-08-14 DIAGNOSIS — F418 Other specified anxiety disorders: Secondary | ICD-10-CM | POA: Diagnosis not present

## 2021-08-14 DIAGNOSIS — R5381 Other malaise: Secondary | ICD-10-CM

## 2021-08-14 HISTORY — DX: Other malaise: R53.81

## 2021-08-14 HISTORY — DX: Iron deficiency: E61.1

## 2021-08-14 MED ORDER — ALPRAZOLAM 0.25 MG PO TABS: 0.2500 mg | ORAL_TABLET | Freq: Two times a day (BID) | ORAL | 1 refills | Status: DC | PRN

## 2021-08-14 MED ORDER — FLUOXETINE HCL 10 MG PO TABS: 10.0000 mg | ORAL_TABLET | Freq: Every day | ORAL | 3 refills | Status: DC

## 2021-08-14 MED ORDER — FERROUS SULFATE 325 (65 FE) MG PO TABS: 325.0000 mg | ORAL_TABLET | ORAL | 1 refills | Status: DC

## 2021-08-14 NOTE — Progress Notes (Signed)
Established Patient Office Visit  Subjective   Patient ID: Kaitlyn Rodriguez, female    DOB: 1934-01-22  Age: 88 y.o. MRN: [id number]  Chief Complaint  Patient presents with   Follow-up    6 week follow up, not sure if she could have liver pudding to eat, runny nose, swelling in feet. Patient not fasting.     HPI follow-up of anxiety with depression.  Could not tolerate Celexa due to nausea.  Continues with alprazolam.  Exercises and difficult for her secondary to arthritis and lower back pain.  She is able to ride an exercise bike some.  Physical therapy has helped in the past.    Review of Systems  Constitutional:  Negative for chills, diaphoresis, malaise/fatigue and weight loss.  HENT: Negative.    Eyes: Negative.  Negative for blurred vision and double vision.  Respiratory:  Positive for shortness of breath. Negative for cough and wheezing.   Cardiovascular:  Negative for chest pain.  Gastrointestinal:  Negative for abdominal pain.  Genitourinary: Negative.   Musculoskeletal:  Positive for back pain and joint pain. Negative for falls and myalgias.  Neurological:  Negative for speech change, loss of consciousness and weakness.  Psychiatric/Behavioral: Negative.        08/14/2021    3:39 PM 07/03/2021    4:54 PM 07/03/2021    4:25 PM  Depression screen PHQ 2/9  Decreased Interest 2 1 1   Down, Depressed, Hopeless 2 1 1   PHQ - 2 Score 4 2 2   Altered sleeping  0   Tired, decreased energy  3   Change in appetite  0   Feeling bad or failure about yourself   0   Trouble concentrating  0   Moving slowly or fidgety/restless  0   Suicidal thoughts  0   PHQ-9 Score  5   Difficult doing work/chores  Somewhat difficult        Objective:     BP (!) 146/92 (BP Location: Left Arm, Patient Position: Sitting, Cuff Size: Large)   Pulse 98   Temp 97.6 F (36.4 C) (Temporal)   Ht 5\' 7"  (1.702 m)   Wt 216 lb (98 kg) Comment: per patient at home this morning  SpO2 93%   BMI 33.83  kg/m    Physical Exam Constitutional:      General: She is not in acute distress.    Appearance: Normal appearance. She is not ill-appearing, toxic-appearing or diaphoretic.  HENT:     Head: Normocephalic and atraumatic.     Right Ear: External ear normal.     Left Ear: External ear normal.     Mouth/Throat:     Mouth: Mucous membranes are moist.  Eyes:     General: No scleral icterus.       Right eye: No discharge.        Left eye: No discharge.     Extraocular Movements: Extraocular movements intact.     Conjunctiva/sclera: Conjunctivae normal.  Cardiovascular:     Rate and Rhythm: Normal rate and regular rhythm.  Pulmonary:     Effort: Pulmonary effort is normal. No respiratory distress.     Breath sounds: Normal breath sounds.  Skin:    General: Skin is warm and dry.  Neurological:     Mental Status: She is alert and oriented to person, place, and time.  Psychiatric:        Mood and Affect: Mood normal.        Behavior: Behavior  normal.      No results found for any visits on 08/14/21.    The ASCVD Risk score (Arnett DK, et al., 2019) failed to calculate for the following reasons:   The 2019 ASCVD risk score is only valid for ages 74 to 45    Assessment & Plan:   Problem List Items Addressed This Visit       Other   Anxiety with depression - Primary   Relevant Medications   ALPRAZolam (XANAX) 0.25 MG tablet   FLUoxetine (PROZAC) 10 MG tablet   Physical deconditioning   Relevant Orders   Ambulatory referral to Home Health   Iron deficiency   Relevant Medications   ferrous sulfate 325 (65 FE) MG tablet    Return in about 8 weeks (around 10/09/2021), or if symptoms worsen or fail to improve.    Mliss Sax, MD

## 2021-08-21 ENCOUNTER — Telehealth: Payer: Self-pay | Admitting: Family Medicine

## 2021-08-21 DIAGNOSIS — F418 Other specified anxiety disorders: Secondary | ICD-10-CM

## 2021-08-21 NOTE — Telephone Encounter (Signed)
Pt son called and stated that his mom would like to switch from prozac medication to zoloft because her friends use that medication and it seemsto work for her friends. Please call pt son ( tommy @ [phone])

## 2021-08-23 MED ORDER — SERTRALINE HCL 25 MG PO TABS: 25.0000 mg | ORAL_TABLET | Freq: Every day | ORAL | 1 refills | Status: DC

## 2021-08-23 NOTE — Telephone Encounter (Signed)
Called patient to inform of medication change no answer LMTCB

## 2021-08-23 NOTE — Telephone Encounter (Signed)
Patient aware of message below.

## 2021-08-24 ENCOUNTER — Telehealth: Payer: Self-pay | Admitting: Cardiology

## 2021-08-24 NOTE — Telephone Encounter (Signed)
Pt c/o swelling: STAT is pt has developed SOB within 24 hours  If swelling, where is the swelling located? feet  How much weight have you gained and in what time span? no  Have you gained 3 pounds in a day or 5 pounds in a week? no  Do you have a log of your daily weights (if so, list)? /a  Are you currently taking a fluid pill? 1 a day  Are you currently SOB? no  Have you traveled recently? No  Pt states that her feet are swelling and she's been taking one lasix pill a day and she wants to know if that's okay.  She also states that she has been given zoloft and she wants to know if its okay for her to take it with her bp medications and fluid pills.  She states she'd rather you cal the home phone

## 2021-08-24 NOTE — Telephone Encounter (Signed)
Spoke to patient she stated she has been having swelling in top of both feet for the past 1 week.She gets sob walking from room to room.She increased furosemide to 40 mg daily for the past 1 week and she elevates her feet.Stated swelling has improved.Appointment scheduled with Edd Fabian NP 7/5 at 2:45 pm.

## 2021-08-27 NOTE — Progress Notes (Deleted)
Cardiology Clinic Note   Patient Name: Kaitlyn Rodriguez Date of Encounter: 08/27/2021  Primary Care Provider:  Mliss Sax, MD Primary Cardiologist:  Thomasene Ripple, DO  Patient Profile    Kaitlyn Rodriguez 88 year old female presents to the clinic today for follow-up evaluation of her atrial fibrillation and lower extremity swelling.  Past Medical History    Past Medical History:  Diagnosis Date   Anxiety disorder, unspecified    Benign essential hypertension    Dissection of unspecified site of aorta (HCC) 11/2018   Heart failure, unspecified (HCC)    History of aortic valve replacement    Hypothyroidism, unspecified    Unspecified osteoarthritis, unspecified site    Vitamin B12 deficiency anemia    Past Surgical History:  Procedure Laterality Date   ABDOMINAL HYSTERECTOMY     at age 53   REPLACEMENT ASCENDING AORTA N/A 12/20/2018   Procedure: REPAIR OF TYPE A AORTIC DISSECTION USING HEMASHIELD PLATINUM AND GRAFT;  Surgeon: Linden Dolin, MD;  Location: [facility] OR;  Service: Open Heart Surgery;  Laterality: N/A;   STERNAL CLOSURE  12/20/2018   Procedure: STERNAL PLATING;  Surgeon: Linden Dolin, MD;  Location: [facility] OR;  Service: Open Heart Surgery;;    Allergies  No Known Allergies  History of Present Illness    Kaitlyn Rodriguez has a PMH of acute combined systolic and diastolic CHF, nonrheumatic mitral valve regurgitation, aortic valve insufficiency, pulmonary hypertension, TIA, aortic aneurysm rupture status postrepair 2020, obesity, CVA, and permanent atrial fibrillation.  She was seen by Dr.Tobb on 05/23/2021.  During that time she had no chest pain and denies shortness of breath.  She had recently celebrated her 47 birthday.  She contacted the nurse triage line on 08/24/2021.  She reported that she was having swelling along the top of both of her feet x1 week.  She noted shortness of breath with ambulation.  She increased her furosemide to 40 mg daily over  the past week and had been elevating her feet.  Her swelling had improved.  She presents to the clinic today for follow-up evaluation states***  *** denies chest pain, shortness of breath, lower extremity edema, fatigue, palpitations, melena, hematuria, hemoptysis, diaphoresis, weakness, presyncope, syncope, orthopnea, and PND.  Bilateral lower extremity swelling-euvolemic today.  Weight today***.  1 week prior to presenting to the clinic she had increased her furosemide to 40 mg daily and was elevating her lower extremities. Continue furosemide Daily weights Elevate lower extremities when not active Lower extremity support stockings Low-salt diet  Atrial fibrillation-heart rate today***.  Denies recent episodes of increased or irregular heartbeat.  No increased fatigue or DOE.  Reports compliance with apixaban and denies bleeding issues. Continue apixaban, metoprolol Heart healthy low-sodium diet-salty 6 given Increase physical activity as tolerated Avoid triggers caffeine, chocolate, EtOH, dehydration etc.  Acute combined systolic and diastolic CHF-euvolemic today.  Breathing has returned to baseline.  NYHA class II.  Echocardiogram 10/22 showed an LVEF of 35-40% with intermediate diastolic parameters.  She was also noted to have mild to moderate aortic valve sclerosis.  Mild dilation of the ascending aorta measured 40 mm.  Severe mitral valve regurgitation with posteriorly directed jet also noted. Continue furosemide, losartan, metoprolol Heart healthy low-sodium diet-salty 6 given Increase physical activity as tolerated  Status post aortic aneurysm repair-blood pressure well controlled.  No recent episodes of chest or back discomfort. Blood pressure log  Pulmonary hypertension-breathing has returned to baseline post diuresis.  Echocardiogram 10/22 showed  mildly elevated pulmonary artery systolic pressure.  Euvolemic today.  Breathing at baseline. Continue furosemide  Mitral valve  regurgitation-murmur***.  No increased DOE or activity intolerance.  Breathing at baseline.  10/22 echocardiogram showed severe mitral valve regurgitation.  Details above. Continue to monitor.   Echocardiogram 12/04/2020 IMPRESSIONS     1. Left ventricular ejection fraction, by estimation, is 35 to 40%. The  left ventricle has moderately decreased function. The left ventricle has  no regional wall motion abnormalities. Left ventricular diastolic  parameters are indeterminate.   2. Right ventricular systolic function is moderately reduced. The right  ventricular size is mildly enlarged. There is mildly elevated pulmonary  artery systolic pressure. The estimated right ventricular systolic  pressure is 36.5 mmHg.   3. Left atrial size was severely dilated.   4. Right atrial size was severely dilated.   5. The mitral valve is normal in structure. Severe mitral valve  regurgitation. No evidence of mitral stenosis.   6. Tricuspid valve regurgitation is moderate.   7. The aortic valve is calcified. Aortic valve regurgitation is mild.  Mild to moderate aortic valve sclerosis/calcification is present, without  any evidence of aortic stenosis.   8. Pulmonic valve regurgitation is moderate.   9. Aortic dilatation noted and root/ascending aorta has been  repaired/replaced. There is mild dilatation of the ascending aorta,  measuring 40 mm.  10. The inferior vena cava is normal in size with greater than 50%  respiratory variability, suggesting right atrial pressure of 3 mmHg. Home Medications    Prior to Admission medications   Medication Sig Start Date End Date Taking? Authorizing Provider  ALPRAZolam (XANAX) 0.25 MG tablet Take 1 tablet (0.25 mg total) by mouth 2 (two) times daily as needed for anxiety. 08/14/21   Mliss Sax, MD  apixaban (ELIQUIS) 5 MG TABS tablet Take 1 tablet by mouth twice daily 06/05/21   Tobb, Kardie, DO  cyanocobalamin (,VITAMIN B-12,) 1000 MCG/ML injection  Inject 1 mL (1,000 mcg total) into the muscle every 30 (thirty) days. 03/23/21   Mliss Sax, MD  ferrous sulfate 325 (65 FE) MG tablet Take 1 tablet (325 mg total) by mouth every other day. 08/14/21   Mliss Sax, MD  FLUoxetine (PROZAC) 10 MG tablet Take 1 tablet (10 mg total) by mouth daily. 08/14/21   Mliss Sax, MD  furosemide (LASIX) 40 MG tablet Take 1 tablet (40 mg total) by mouth every other day. 06/19/21   Tobb, Kardie, DO  losartan (COZAAR) 25 MG tablet Take 1 tablet (25 mg total) by mouth daily. 01/02/21   Tobb, Kardie, DO  metoprolol succinate (TOPROL-XL) 50 MG 24 hr tablet Take 1 tablet (50 mg total) by mouth in the morning and at bedtime. Take with or immediately following a meal. 01/02/21   Tobb, Kardie, DO  Multiple Vitamin (MULTIVITAMIN WITH MINERALS) TABS tablet Take 1 tablet by mouth daily. 01/08/19   Ardelle Balls, PA-C  sertraline (ZOLOFT) 25 MG tablet Take 1 tablet (25 mg total) by mouth daily. 08/23/21   Mliss Sax, MD  SYNTHROID 125 MCG tablet Take 125 mcg by mouth every morning. 04/22/20   [provider]    Family History    Family History  Problem Relation Age of Onset   Hypertension Father    She indicated that her mother is deceased. She indicated that her father is deceased.  Social History    Social History   Socioeconomic History   Marital status: Widowed  Spouse name: Not on file   Number of children: Not on file   Years of education: Not on file   Highest education level: Not on file  Occupational History   Not on file  Tobacco Use   Smoking status: Never   Smokeless tobacco: Never  Vaping Use   Vaping Use: Never used  Substance and Sexual Activity   Alcohol use: Never   Drug use: Never   Sexual activity: Not Currently  Other Topics Concern   Not on file  Social History Narrative   Not on file   Social Determinants of Health   Financial Resource Strain: Not on file  Food  Insecurity: Not on file  Transportation Needs: Not on file  Physical Activity: Not on file  Stress: Not on file  Social Connections: Not on file  Intimate Partner Violence: Not on file     Review of Systems    General:  No chills, fever, night sweats or weight changes.  Cardiovascular:  No chest pain, dyspnea on exertion, edema, orthopnea, palpitations, paroxysmal nocturnal dyspnea. Dermatological: No rash, lesions/masses Respiratory: No cough, dyspnea Urologic: No hematuria, dysuria Abdominal:   No nausea, vomiting, diarrhea, bright red blood per rectum, melena, or hematemesis Neurologic:  No visual changes, wkns, changes in mental status. All other systems reviewed and are otherwise negative except as noted above.  Physical Exam    VS:  There were no vitals taken for this visit. , BMI There is no height or weight on file to calculate BMI. GEN: Well nourished, well developed, in no acute distress. HEENT: normal. Neck: Supple, no JVD, carotid bruits, or masses. Cardiac: RRR, no murmurs, rubs, or gallops. No clubbing, cyanosis, edema.  Radials/DP/PT 2+ and equal bilaterally.  Respiratory:  Respirations regular and unlabored, clear to auscultation bilaterally. GI: Soft, nontender, nondistended, BS + x 4. MS: no deformity or atrophy. Skin: warm and dry, no rash. Neuro:  Strength and sensation are intact. Psych: Normal affect.  Accessory Clinical Findings    Recent Labs: 12/03/2020: ALT 11 06/14/2021: BUN 11; Creatinine, Ser 0.74; Hemoglobin 14.9; Magnesium 2.5; Platelets 203; Potassium 4.8; Sodium 128 07/03/2021: TSH 1.22   Recent Lipid Panel    Component Value Date/Time   CHOL 154 12/04/2020 0422   TRIG 72 12/04/2020 0422   HDL 46 12/04/2020 0422   CHOLHDL 3.3 12/04/2020 0422   VLDL 14 12/04/2020 0422   LDLCALC 94 12/04/2020 0422    ECG personally reviewed by me today- *** - No acute changes  Assessment & Plan   1.  ***   Thomasene Ripple. Trev Boley NP-C    08/27/2021, 1:39  PM [facility] [address] Office [phone] Fax [phone]  Notice: This dictation was prepared with Dragon dictation along with smaller phrase technology. Any transcriptional errors that result from this process are unintentional and may not be corrected upon review.  I spent***minutes examining this patient, reviewing medications, and using patient centered shared decision making involving her cardiac care.  Prior to her visit I spent greater than 20 minutes reviewing her past medical history,  medications, and prior cardiac tests.

## 2021-08-30 ENCOUNTER — Ambulatory Visit: Payer: Medicare Other | Admitting: General Practice

## 2021-09-04 ENCOUNTER — Encounter: Payer: Self-pay | Admitting: General Practice

## 2021-09-08 NOTE — Telephone Encounter (Signed)
error 

## 2021-09-20 ENCOUNTER — Telehealth: Payer: Self-pay | Admitting: Cardiology

## 2021-09-20 NOTE — Telephone Encounter (Signed)
Spoke to patient she stated she just don't feel good.Stated she is tired,no energy.No chest pain.No sob.Stated she was prescribed Zoloft 25 mg daily,but has not started taking.Advised she needs to take Zoloft as prescribed.Keep appointment scheduled with Dr.Tobb 9/7 at 2:30 pm.

## 2021-09-20 NOTE — Telephone Encounter (Signed)
Pt c/o swelling: STAT is pt has developed SOB within 24 hours  If swelling, where is the swelling located? Both legs are swollen  How much weight have you gained and in what time span? Unsure   Have you gained 3 pounds in a day or 5 pounds in a week? unsure  Do you have a log of your daily weights (if so, list)? unsure  Are you currently taking a fluid pill? Yes, every other day  Are you currently SOB? no  Have you traveled recently? No  Patient states she can hardly walk, and her back has been hurting for the past 2-3 weeks.

## 2021-09-22 ENCOUNTER — Telehealth: Payer: Self-pay | Admitting: Family Medicine

## 2021-09-22 DIAGNOSIS — F418 Other specified anxiety disorders: Secondary | ICD-10-CM

## 2021-09-22 NOTE — Telephone Encounter (Signed)
.  Caller Name: Jermisha Hoffart Call back phone #: [phone]  Reason for Call:  Pts son would like for her Zannex to be sent to [facility] [phone]. They feel it works better from this pharmacy rather than [facility] because it's a [employer].

## 2021-09-22 NOTE — Telephone Encounter (Signed)
Caller Name: Yalexa Blust Call back phone #: [phone]  Reason for Call: Pts son called asking about the length of time for the referral for 6/19 [facility]. He is concerned that they will not call. His mother is in need of this service.

## 2021-09-22 NOTE — Telephone Encounter (Signed)
I was unable to find someone to take her with this insurance last month. But I'm circling back around to my HH Reps to see if I get any luck. Will keep you updated

## 2021-09-25 ENCOUNTER — Telehealth: Payer: Self-pay | Admitting: Cardiology

## 2021-09-25 NOTE — Telephone Encounter (Signed)
  Pt c/o swelling: STAT is pt has developed SOB within 24 hours  If swelling, where is the swelling located? Both legs   How much weight have you gained and in what time span? Not sure  Have you gained 3 pounds in a day or 5 pounds in a week? not sure   Do you have a log of your daily weights (if so, list)? Not sure   Are you currently taking a fluid pill? Yes   Are you currently SOB? Yes with exertion   Have you traveled recently? No   Pt's son calling about pt's legs. both legs is swollen, they are not sure if pt gained weight. However, pt does get a little SOB on exertion

## 2021-09-25 NOTE — Telephone Encounter (Signed)
Returned call to patient and patients son who state that patient has been having an increase in swelling over the last week in her lower extremities. Patient reports that her feet and legs are very tight and reports that it is hard to walk and that this is worsening. Patient states that she has been eating take out quite a bit and states that she feels that may have been contributing. Patient states that she is also now short of breath and having difficulty doing her daily activities without getting short of breath. Patient takes Lasix 40mg  every other day and took last dose yesterday. Spoke with DOD who recommends patient take dose of Lasix today as well. Advised patient to monitor intake of sodium and to keep legs elevated. Scheduled patient an appointment for tomorrow with DOD to address concerns as worsening shortness of breath and swelling.   Made patient aware of ED precautions should new or worsening symptoms develop. Patient verbalized understanding.   Will forward to MD to make aware.

## 2021-09-26 ENCOUNTER — Ambulatory Visit: Payer: Medicare Other | Admitting: Cardiology

## 2021-09-26 ENCOUNTER — Encounter: Payer: Self-pay | Admitting: Cardiology

## 2021-09-26 ENCOUNTER — Other Ambulatory Visit: Payer: Self-pay

## 2021-09-26 VITALS — BP 140/80 | HR 82 | Resp 20 | Ht 67.0 in | Wt 216.0 lb

## 2021-09-26 DIAGNOSIS — D6869 Other thrombophilia: Secondary | ICD-10-CM | POA: Diagnosis not present

## 2021-09-26 DIAGNOSIS — I4819 Other persistent atrial fibrillation: Secondary | ICD-10-CM

## 2021-09-26 DIAGNOSIS — I10 Essential (primary) hypertension: Secondary | ICD-10-CM

## 2021-09-26 DIAGNOSIS — I340 Nonrheumatic mitral (valve) insufficiency: Secondary | ICD-10-CM | POA: Diagnosis not present

## 2021-09-26 DIAGNOSIS — I5042 Chronic combined systolic (congestive) and diastolic (congestive) heart failure: Secondary | ICD-10-CM | POA: Diagnosis not present

## 2021-09-26 DIAGNOSIS — F418 Other specified anxiety disorders: Secondary | ICD-10-CM

## 2021-09-26 DIAGNOSIS — E871 Hypo-osmolality and hyponatremia: Secondary | ICD-10-CM

## 2021-09-26 HISTORY — DX: Other persistent atrial fibrillation: I48.19

## 2021-09-26 MED ORDER — ALPRAZOLAM 0.25 MG PO TABS: 0.2500 mg | ORAL_TABLET | Freq: Two times a day (BID) | ORAL | 1 refills | Status: DC | PRN

## 2021-09-26 NOTE — Progress Notes (Signed)
Primary Care Provider: Mliss Sax, MD Cardiologist: Thomasene Ripple, DO Electrophysiologist: None  Clinic Note: Chief Complaint  Patient presents with   Leg Swelling    Worsening leg swelling, legs are swollen and red up to the knees.   Congestive Heart Failure    Significantly reduced EF with multivalve disease.  Now with 8 pound gain from "stable weight ".   Atrial Fibrillation    Unclear of A-fib burden-?persistent, permanent or paroxysmal; patient was told that she was not in A-fib at last evaluation   ===================================  ASSESSMENT/PLAN   Problem List Items Addressed This Visit       Cardiology Problems   Chronic combined systolic and diastolic congestive heart failure (HCC) (Chronic)    88 year old woman with cardiomyopathy following aortic dissection repair.  EF 35 to 40%.  Interestingly, no regional wall motion normality?.  It seems to be global hypokinesis as the right ventricle was also decreased function.  Mildly elevated PAP.  She has severe valvular disease with MR, TR, PR and AI.  Overall, she is pretty sedentary not doing much for the moving around the house.  Baseline orthopnea but now presenting with worsening lower extreme edema.  She tells me that when she saw Dr. Servando Salina last, her ankles and feet were pretty much skinny.  Weight at that time was roughly 208 pounds meaning that she is probably 8 pounds up.  She has been taking her furosemide every other day and I suspect she is also getting more salt in her diet from eating out more frequently.  Plan: Monitor salt intake. Weigh yourself tonight and then every day in the morning. Titrate up her Lasix dosing starting tomorrow: Day 1: 80 mg x 1 Day 2 and 3: 40 mg daily -> day 4: break Day 5, 6 and 7: 40 mg daily -> day 8: break She will continue with 3 days on 1 day off until her weight gets down to 208 pounds here which would be 8 pounds less than what she weighs when she goes home  tonight Have BMP checked @  PCP's office on 8/8. Going forward, I perceive that she will probably need to take her Lasix more than every other day.  Would probably move to taking it 4-5 days a week She needs to be educated on the concept of sliding scale Lasix.  I think her son is probably the one that would be the most prudent person to instruct. If BP will tolerate and renal function/potassium levels are stable, could consider titration up of ARB for additional afterload reduction.   (Ostensibly would like to see her on Entresto and spironolactone for true guideline directed therapy-however I am not how aggressive the care team would like to be on titrating medications).      Hypercoagulable state due to persistent atrial fibrillation (HCC) (Chronic)    Remains on Eliquis no bleeding issues.  This patients CHA2DS2-VASc Score and unadjusted Ischemic Stroke Rate (% per year) is equal to 10.8 % stroke rate/year from a score of 8  Above score calculated as 1 point each if present [CHF, HTN, DM, Vascular=MI/PAD/Aortic Plaque, Age if 65-74, or Female] Above score calculated as 2 points each if present [Age > 75, or Stroke/TIA/TE]        Benign essential hypertension (Chronic)    Blood pressure is little high today, but I am a little leery of being overly aggressive.  She could theoretically benefit from additional afterload reduction which would be an increase in  losartan dose.  Would hate to do this without having labs.  Based on blood pressure PCPs office and lab results, could consider titrating up ARB.      Nonrheumatic mitral valve regurgitation (Chronic)    Berry significant MR noted on echo last October.  Has not been checked since. Would defer timing of when to reassess to her primary cardiologist, but if she continues to have mild exacerbations like she is having right now, would probably reassess sooner.  Would anticipate that she would have a check in October of this year, otherwise  that consideration would be simply symptomatic care without further testing.      Persistent atrial fibrillation (HCC) - Primary (Chronic)    Difficult to tell if she is really just PAF for persistent A-fib.  She told me that last time she was seen she was told that she was not in A-fib/she does not have A-fib.  I am not sure how we can say that since I do not see an EKG checked in office on epic since October 2022.  She does not know if she is or is not in A-fib, although it is possible that this current exacerbation could be due to A-fib.  I simply do not know the extent of her A-fib burden.  She is rate controlled on Toprol and remains on Eliquis with no bleeding issues. In the past, patient has started out using antiarrhythmics.      Relevant Orders   EKG 12-Lead     Other   Hyponatremia (Chronic)    Hyponatremia has been issue-need to follow-up labs.  Her convenience since she already has an appointment to see her PCP next week, she can have labs checked later.  She also has an appointment scheduled to see Dr. Berniece Salines on September 9.      ===================================  HPI:    LILLIANNE KEDDY is a 88 y.o. female with a PMH below who presents today for concerns of increased lower extremity swelling and more shortness of breath..  History of type B dissection in 2020, Atrial Fibrillation (unsure permanent or persistent) on Eliquis, dilated cardiomyopathy (with significant valvular disease-PR, TR, MR and AI), history of CVA  BRYNLYN BAFFORD was last seen on May 23, 2021 by Berniece Salines, DO (in the [place] office).  She is doing well from a cardiovascular standpoint.  No medication changes made.  BP acceptable.  Recent Hospitalizations: None  Reviewed  CV studies:    The following studies were reviewed today: (if available, images/films reviewed: From Epic Chart or Care Everywhere) TTE 07/12/2020: Normal LV size and function.  No RWMA.  Mild LVH.  EF 60 to 65%.   Indeterminate DF.  Basal septal HK.  Severe LA dilation.  Mild to moderate MR.  Mild AI with AOV sclerosis but no stenosis.  Severely elevated PAP with moderate RA dilation.  Moderate TR TTE 12/04/2020: EF 35 to 40%.  Mildly dysfunction.  No RWMA.  Indeterminate diastolic parameters.  Mild reduced RV function with mildly enlarged RV.  Mildly elevated PAP.  Severe biatrial dilation.  Severe MR.  Moderate TR.  Mild AI with AOV sclerosis but no stenosis.  Moderate PR normal RAP.  Interval History:   ANUHEA RYBA presents here today as a DOD work in patient for worsening lower extremity swelling.  She presents here today with her son who helps out with a history but also is the one who monitors her medications.  Her primary care giver.  She called in on 09/25/2021 for triage call indicating that her feet and legs are very tight and swollen.  Hard to walk.  She has been eating out quite a bit (mostly takeout).  In addition to edema was also starting to notice worsening shortness of breath with doing ADLs. => DOD recommended that the patient take a dose of Lasix on that day and monitor sodium intake.  Scheduled appointment to be seen today.  Today she comes in very tearful and upset with notable swelling in her legs and erythematous changes.  She says that the leg just tight enough that it is very painful and she has not walking. She has chronic orthopnea sleeping mostly in the recliner because of difficulty breathing this is not a new finding, but the swelling is definitely notably worse.  She acknowledges the dietary indiscretion as noted above.  She does not add extra salt but is not sure how much salt is in the meals that she is getting.  She does try to get things without rabies etc. that she knows what happened salt.  With the swelling, she has noted more fatigue.  She is very scared and concerned that she may be developing a heart attack.  She is worried that her blood pressure is going up.  We talked  about her regimen, she is only taking furosemide every other day.  Was not aware of anything about taking extra doses until yesterday when she called in for advice.  She is clearly in A-fib today on EKG-has not had an EKG since October 2022, but she tells me that when she saw the doctor last time (presumably Dr. Thomasene Ripple), and also her PCP, she says that no one mentioned that he about atrial fibrillation and that her PCP told her that she did not have atrial fibrillation anymore.  I do not think she realizes that this is a recurrent paroxysmal condition which is probably persistent if not almost permanent based on Dr. Mallory Shirk note.  It does not appear that this would be was causing her to have a decompensation.    She and her son were not aware of any idea of weighing herself daily.  She does not know what she would normally weigh at home and is not sure how much her weight is up, but she does feel like her weight is up. Her activity is significantly limited, but she is not having excessive increased work of breath.  Her orthopnea is not really changed and that she sleeps in recliner.  She does not however raise her legs very much and not with very good consistency.  CV Review of Symptoms (Summary) Cardiovascular ROS: positive for - chest pain, dyspnea on exertion, edema, orthopnea, shortness of breath, and feeling tired.  Legs ache and are numb.  Feet and ankles are dark red-purplish-maroon negative for - irregular heartbeat, palpitations, rapid heart rate, or syncope or near syncope, TIA/amaurosis fugax.  No claudication-does not walk enough to notice it.  Has no sensation of irregular heartbeats or slow/fast heartbeats.  REVIEWED OF SYSTEMS   Review of Systems  Constitutional:  Positive for malaise/fatigue. Negative for chills, fever and weight loss.  HENT:  Negative for congestion and nosebleeds.   Respiratory:  Positive for shortness of breath. Negative for cough and wheezing.    Cardiovascular:  Positive for chest pain (Not consistent or persistent, just feels a sense of tightness on occasion.), palpitations and leg swelling.  Gastrointestinal:  Positive for abdominal pain and heartburn.  Negative for blood in stool and melena.  Genitourinary:  Negative for hematuria.  Neurological:  Positive for dizziness, weakness (Generalized) and headaches. Negative for focal weakness.  Psychiatric/Behavioral:  Positive for memory loss. Negative for depression. The patient is nervous/anxious.    I have reviewed and (if needed) personally updated the patient's problem list, medications, allergies, past medical and surgical history, social and family history.   PAST MEDICAL HISTORY   Past Medical History:  Diagnosis Date   Anxiety disorder, unspecified    Benign essential hypertension    Dissection of unspecified site of aorta ([place]) 11/2018   Heart failure, unspecified ([place])    History of aortic valve replacement    Hypothyroidism, unspecified    Unspecified osteoarthritis, unspecified site    Vitamin B12 deficiency anemia     PAST SURGICAL HISTORY   Past Surgical History:  Procedure Laterality Date   ABDOMINAL HYSTERECTOMY     at age 69   REPLACEMENT ASCENDING AORTA N/A 12/20/2018   Procedure: REPAIR OF TYPE A AORTIC DISSECTION USING HEMASHIELD PLATINUM 28MM AND 32MM GRAFT;  Surgeon: Wonda Olds, MD;  Location: [facility];  Service: Open Heart Surgery;  Laterality: N/A;   STERNAL CLOSURE  12/20/2018   Procedure: STERNAL PLATING;  Surgeon: Wonda Olds, MD;  Location: [facility] OR;  Service: Open Heart Surgery;;     There is no immunization history on file for this patient.  MEDICATIONS/ALLERGIES   Current Meds  Medication Sig   ALPRAZolam (XANAX) 0.25 MG tablet Take 1 tablet (0.25 mg total) by mouth 2 (two) times daily as needed for anxiety.   apixaban (ELIQUIS) 5 MG TABS tablet Take 1 tablet by mouth twice daily   cyanocobalamin (,VITAMIN B-12,) 1000 MCG/ML  injection Inject 1 mL (1,000 mcg total) into the muscle every 30 (thirty) days.   ferrous sulfate 325 (65 FE) MG tablet Take 1 tablet (325 mg total) by mouth every other day.   furosemide (LASIX) 40 MG tablet Take 1 tablet (40 mg total) by mouth every other day.   losartan (COZAAR) 25 MG tablet Take 1 tablet (25 mg total) by mouth daily.   metoprolol succinate (TOPROL-XL) 50 MG 24 hr tablet Take 1 tablet (50 mg total) by mouth in the morning and at bedtime. Take with or immediately following a meal.   Multiple Vitamin (MULTIVITAMIN WITH MINERALS) TABS tablet Take 1 tablet by mouth daily.   sertraline (ZOLOFT) 25 MG tablet Take 1 tablet (25 mg total) by mouth daily.   SYNTHROID 125 MCG tablet Take 125 mcg by mouth every morning.    No Known Allergies  SOCIAL HISTORY/FAMILY HISTORY   Reviewed in Epic:  Pertinent findings:  Social History   Tobacco Use   Smoking status: Never   Smokeless tobacco: Never  Vaping Use   Vaping Use: Never used  Substance Use Topics   Alcohol use: Never   Drug use: Never   Social History   Social History Narrative   Not on file    OBJCTIVE -PE, EKG, labs   Wt Readings from Last 3 Encounters:  09/26/21 216 lb (98 kg)  08/14/21 216 lb (98 kg)  07/03/21 210 lb (95.3 kg)    Physical Exam: BP (!) 140/80 (BP Location: Right Arm, Patient Position: Sitting, Cuff Size: Large)   Pulse 82   Resp 20   Ht 5\' 7"  (1.702 m)   Wt 216 lb (98 kg)   SpO2 93%   BMI 33.83 kg/m  Physical Exam Vitals reviewed.  Constitutional:      General: She is not in acute distress.    Appearance: She is obese. She is not toxic-appearing.     Comments: Obese elderly woman she seems more upset than an actual distress.  She has baseline pursed lip breathing.   Seated in a wheelchair, she seems very somewhat fatigued.  Significant bilateral extremity swelling mostly from the knees down at least 2+ in the legs and 3+ in the ankles and feet with deep violaceous discoloration  from mid calf to the toes.  Somewhat numb to touch on the feet. She denies any foot or ankle pain but does feel muscle cramping symptoms in the leg.  HENT:     Head: Normocephalic and atraumatic.  Neck:     Vascular: Decreased carotid pulses. Carotid bruit (Carotid bruit or radiated aortic sclerosis murmur) present. No JVD (Very difficult to visualize.  Mildly increased pressures based on hand vein collapse).  Cardiovascular:     Rate and Rhythm: Normal rate. Rhythm irregularly irregular. Occasional Extrasystoles are present.    Chest Wall: PMI is displaced (Difficult to palpate).     Heart sounds: Murmur (Multiple murmurs including SEM murmur LUSB, HSM murmur at the apex rating to the back as well as HSM murmur heard at the left lower sternal border radiating to the axilla.) heard.     No friction rub. Gallop (Cannot summation gallop with her being in atrial fibs/flutter) present.  Pulmonary:     Effort: No respiratory distress.     Breath sounds: No stridor. Wheezing, rhonchi and rales present.     Comments: Baseline pursed lip breathing on occasion.  He can hear her forced expiration.  Audible expiratory wheezes but not heard on PE. Chest:     Chest wall: Tenderness present.  Abdominal:     General: Abdomen is flat. There is no distension.     Tenderness: There is no abdominal tenderness. There is no rebound.     Comments: Obese  Musculoskeletal:     Cervical back: Neck supple. No rigidity or torticollis. Decreased range of motion (She has notable Scoliosis.  Her neck seems somewhat stiff.).  Neurological:     Mental Status: She is alert.  Psychiatric:        Mood and Affect: Mood normal.        Behavior: Behavior normal.        Thought Content: Thought content normal.        Judgment: Judgment normal.     Comments: This is a tearful and sad.  She feels more completely informed after our discussion she understands management process.     Adult ECG Report  Rate: 82 ;  Rhythm:  sinus arrhythmia and stable rate of 80 bpm.  LAFB.  Cannot rule out anterior Mikan age-indeterminate. ;   Narrative Interpretation: Remains in A-fib, but rate better controlled since October 2022 -> LAFB now present.  Borderline MI, age-indeterminate, resent)  Recent Labs: Last labs were from April 19, 23 noted hypokalemia but otherwise stable. Lab Results  Component Value Date   CHOL 154 12/04/2020   HDL 46 12/04/2020   LDLCALC 94 12/04/2020   TRIG 72 12/04/2020   CHOLHDL 3.3 12/04/2020   Lab Results  Component Value Date   CREATININE 0.74 06/14/2021   BUN 11 06/14/2021   NA 128 (L) 06/14/2021   K 4.8 06/14/2021   CL 91 (L) 06/14/2021   CO2 24 06/14/2021      Latest Ref Rng & Units  06/14/2021    3:41 PM 12/06/2020   10:42 AM 12/05/2020   12:25 PM  CBC  WBC 3.4 - 10.8 x10E3/uL 7.3  8.4  7.2   Hemoglobin 11.1 - 15.9 g/dL 14.9  16.9  16.3   Hematocrit 34.0 - 46.6 % 44.0  50.8  47.8   Platelets 150 - 450 x10E3/uL 203  223  209     Lab Results  Component Value Date   HGBA1C 5.4 12/04/2020   Lab Results  Component Value Date   TSH 1.22 07/03/2021    ================================================== I spent a total of 53 minutes with the patient spent in direct patient consultation.  Additional time spent with chart review  / charting (studies, outside notes, etc): 49 min Total Time: 102 min  Current medicines are reviewed at length with the patient today.  (+/- concerns) N/A  Notice: This dictation was prepared with Dragon dictation along with smart phrase technology. Any transcriptional errors that result from this process are unintentional and may not be corrected upon review.  Studies Ordered:   Orders Placed This Encounter  Procedures   EKG 12-Lead   No orders of the defined types were placed in this encounter.   Patient Instructions / Medication Changes & Studies & Tests Ordered   Patient Instructions  Medication Instructions:   Weight yourself then  you get home . Then daily every mornig.           Instruction on how to take your Lasix ( furosemide)    tomorrow ( Wednesday 09/27/21)  take 2 tablets of 40 mg( total of 80 mg)  Thursday , Friday take 40 mg ( 1 tablet daily ) Saturday  do not take any  Furosemide Sunday , Monday and Tuesday  take 40 mg tablet daily  Wednesday do not take any furosemide. Repeat the taking 3 days and off one day until your weight is 8 lbs less than 09/26/21 weight       *If you need a refill on your cardiac medications before your next appointment, please call your pharmacy*   Lab Work: Not needed at present   But Dr Ethelene Hal should follow up with a BMP at your next visit      Testing/Procedures: Not needed   Follow-Up: At [facility], you and your health needs are our priority.  As part of our continuing mission to provide you with exceptional heart care, we have created designated Provider Care Teams.  These Care Teams include your primary Cardiologist (physician) and Advanced Practice Providers (APPs -  Physician Assistants and Nurse Practitioners) who all work together to provide you with the care you need, when you need it.  We recommend signing up for the patient portal called "MyChart".  Sign up information is provided on this After Visit Summary.  MyChart is used to connect with patients for Virtual Visits (Telemedicine).  Patients are able to view lab/test results, encounter notes, upcoming appointments, etc.  Non-urgent messages can be sent to your provider as well.   To learn more about what you can do with MyChart, go to [email or web address].    Your next appointment:    Keep appointment with Dr Alfonso Ramus and Dr Harriet Masson  The format for your next appointment:   In Person  Provider:   Berniece Salines, DO     Important Information About Sugar           Leonie Man, MD, MS Glenetta Hew, M.D., M.S. Interventional Cardiologist  [facility]  Pager #  [phone] Phone # [phone] [address]. [place], [place] [address]   Thank you for choosing [facility] [place]!!

## 2021-09-26 NOTE — Assessment & Plan Note (Addendum)
Difficult to tell if she is really just PAF for persistent A-fib.  She told me that last time she was seen she was told that she was not in A-fib/she does not have A-fib.  I am not sure how we can say that since I do not see an EKG checked in office on epic since October 2022.  She does not know if she is or is not in A-fib, although it is possible that this current exacerbation could be due to A-fib.  I simply do not know the extent of her A-fib burden.  She is rate controlled on Toprol and remains on Eliquis with no bleeding issues. In the past, patient has started out using antiarrhythmics.

## 2021-09-26 NOTE — Assessment & Plan Note (Addendum)
88 year old woman with cardiomyopathy following aortic dissection repair.  EF 35 to 40%.  Interestingly, no regional wall motion normality?.  It seems to be global hypokinesis as the right ventricle was also decreased function.  Mildly elevated PAP.  She has severe valvular disease with MR, TR, PR and AI.  Overall, she is pretty sedentary not doing much for the moving around the house.  Baseline orthopnea but now presenting with worsening lower extreme edema.  She tells me that when she saw Dr. Servando Salina last, her ankles and feet were pretty much skinny.  Weight at that time was roughly 208 pounds meaning that she is probably 8 pounds up.  She has been taking her furosemide every other day and I suspect she is also getting more salt in her diet from eating out more frequently.  Plan: Monitor salt intake. Weigh yourself tonight and then every day in the morning.  Titrate up her Lasix dosing starting tomorrow:  Day 1: 80 mg x 1  Day 2 and 3: 40 mg daily -> day 4: break  Day 5, 6 and 7: 40 mg daily -> day 8: break  She will continue with 3 days on 1 day off until her weight gets down to 208 pounds here which would be 8 pounds less than what she weighs when she goes home tonight  Have BMP checked @  PCP's office on 8/8.  Going forward, I perceive that she will probably need to take her Lasix more than every other day.  Would probably move to taking it 4-5 days a week  She needs to be educated on the concept of sliding scale Lasix.  I think her son is probably the one that would be the most prudent person to instruct.  If BP will tolerate and renal function/potassium levels are stable, could consider titration up of ARB for additional afterload reduction.    (Ostensibly would like to see her on Entresto and spironolactone for true guideline directed therapy-however I am not how aggressive the care team would like to be on titrating medications).

## 2021-09-26 NOTE — Assessment & Plan Note (Signed)
Berry significant MR noted on echo last October.  Has not been checked since. Would defer timing of when to reassess to her primary cardiologist, but if she continues to have mild exacerbations like she is having right now, would probably reassess sooner.  Would anticipate that she would have a check in October of this year, otherwise that consideration would be simply symptomatic care without further testing.

## 2021-09-26 NOTE — Assessment & Plan Note (Signed)
Blood pressure is little high today, but I am a little leery of being overly aggressive.  She could theoretically benefit from additional afterload reduction which would be an increase in losartan dose.  Would hate to do this without having labs.  Based on blood pressure PCPs office and lab results, could consider titrating up ARB.

## 2021-09-26 NOTE — Assessment & Plan Note (Signed)
Hyponatremia has been issue-need to follow-up labs.  Her convenience since she already has an appointment to see her PCP next week, she can have labs checked later.  She also has an appointment scheduled to see Dr. Thomasene Ripple on September 9.

## 2021-09-26 NOTE — Assessment & Plan Note (Signed)
Remains on Eliquis no bleeding issues.  This patients CHA2DS2-VASc Score and unadjusted Ischemic Stroke Rate (% per year) is equal to 10.8 % stroke rate/year from a score of 8  Above score calculated as 1 point each if present [CHF, HTN, DM, Vascular=MI/PAD/Aortic Plaque, Age if 65-74, or Female] Above score calculated as 2 points each if present [Age > 75, or Stroke/TIA/TE]

## 2021-09-26 NOTE — Patient Instructions (Signed)
Medication Instructions:   Weight yourself then you get home . Then daily every mornig.           Instruction on how to take your Lasix ( furosemide)    tomorrow ( Wednesday 09/27/21)  take 2 tablets of 40 mg( total of 80 mg)  Thursday , Friday take 40 mg ( 1 tablet daily ) Saturday  do not take any  Furosemide Sunday , Monday and Tuesday  take 40 mg tablet daily  Wednesday do not take any furosemide. Repeat the taking 3 days and off one day until your weight is 8 lbs less than 09/26/21 weight       *If you need a refill on your cardiac medications before your next appointment, please call your pharmacy*   Lab Work: Not needed at present   But Dr Doreene Burke should follow up with a BMP at your next visit      Testing/Procedures: Not needed   Follow-Up: At [facility], you and your health needs are our priority.  As part of our continuing mission to provide you with exceptional heart care, we have created designated Provider Care Teams.  These Care Teams include your primary Cardiologist (physician) and Advanced Practice Providers (APPs -  Physician Assistants and Nurse Practitioners) who all work together to provide you with the care you need, when you need it.  We recommend signing up for the patient portal called "MyChart".  Sign up information is provided on this After Visit Summary.  MyChart is used to connect with patients for Virtual Visits (Telemedicine).  Patients are able to view lab/test results, encounter notes, upcoming appointments, etc.  Non-urgent messages can be sent to your provider as well.   To learn more about what you can do with MyChart, go to [email or web address].    Your next appointment:    Keep appointment with Dr Farris Has and Dr Servando Salina  The format for your next appointment:   In Person  Provider:   Thomasene Ripple, DO     Important Information About Sugar

## 2021-09-28 NOTE — Telephone Encounter (Signed)
Please advise message below per referrals patients insurance being rejected for [facility] would you like to try Outpatient or see if case manager can assist?

## 2021-10-02 ENCOUNTER — Telehealth: Payer: Self-pay | Admitting: Family Medicine

## 2021-10-02 NOTE — Telephone Encounter (Signed)
Caller Name: Khianna Blazina Call back phone #: [phone]  Reason for Call: Please give pt a call back. She has a follow up appt 8/08 @3pm . She believes she needs blood work but does not want to come to the office. I advised her if blood work was necessary she would have to be here in person. She still has questions about this.

## 2021-10-03 ENCOUNTER — Ambulatory Visit: Payer: Medicare Other | Admitting: Family Medicine

## 2021-10-04 NOTE — Telephone Encounter (Signed)
Spoke with patient who would like to know if someone could come out to the home for blood draws. Patient verbally understood we do not come out to the home for blood draws.

## 2021-10-17 ENCOUNTER — Other Ambulatory Visit: Payer: Self-pay | Admitting: Family Medicine

## 2021-10-17 DIAGNOSIS — F418 Other specified anxiety disorders: Secondary | ICD-10-CM

## 2021-10-17 NOTE — Telephone Encounter (Signed)
Patient stated both of her feet and toes are swollen. She has not followed the lasix regimen Dr. Herbie Baltimore prescribed. She has been taking lasix 40mg  daily since August 1, 23. She eats chips, hamburger steaks and mashed potatoes this past week. Patient education on low sodium diet and following medication regiment. She has been taking lasix at night. Recommended that she restart the lasix regimen tomorrow morning and follow it as directed. 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 Also discussed elevating feet when sitting and to wear her support stockings. Patient had me speak with her son. The previous notes were discussed with him as well. Patient will contact clinic on Thursday to update Monday on her feet edema.

## 2021-10-17 NOTE — Telephone Encounter (Signed)
Pt c/o swelling: STAT is pt has developed SOB within 24 hours  If swelling, where is the swelling located? Feet and legs  How much weight have you gained and in what time span?  Have not gained any  Have you gained 3 pounds in a day or 5 pounds in a week?   Do you have a log of your daily weights (if so, list)?    Are you currently taking a fluid pill? Yes  Are you currently SOB? yes    Have you traveled recently? no

## 2021-10-19 ENCOUNTER — Telehealth: Payer: Self-pay | Admitting: Cardiology

## 2021-10-19 NOTE — Telephone Encounter (Signed)
Son was calling wanting to speak to the nurse. Please advise

## 2021-10-19 NOTE — Telephone Encounter (Signed)
Spoke with patient who wants to change cardiologists. Patient wants to keep September appointment with Dr. Servando Salina and, if still prefers to change cardiologists at that time, will put in the request.

## 2021-10-26 ENCOUNTER — Telehealth: Payer: Self-pay | Admitting: Family Medicine

## 2021-10-26 ENCOUNTER — Other Ambulatory Visit: Payer: Self-pay | Admitting: Family Medicine

## 2021-10-26 DIAGNOSIS — F418 Other specified anxiety disorders: Secondary | ICD-10-CM

## 2021-10-26 NOTE — Telephone Encounter (Signed)
Caller Name: Marybelle Giraldo Call back phone #: [phone]  Reason for Call: Pt recently stated taking a new medication for her depression on August 28th. She has now lost her appetite and has diarrhea. Please call pt and advise on how to continue

## 2021-10-31 ENCOUNTER — Ambulatory Visit: Payer: Medicare Other | Admitting: Cardiology

## 2021-10-31 ENCOUNTER — Telehealth: Payer: Self-pay | Admitting: Cardiology

## 2021-10-31 NOTE — Telephone Encounter (Signed)
Returned patients call several attempts no answer needing to clarify which medication patient is referring to last meds sent were 09/26/21 and 08/23/21. LMTCB

## 2021-10-31 NOTE — Telephone Encounter (Signed)
Pt called stating she has been very nervous and doesn't feel good and wants to been seen sooner, offered appt at 8:40am tomorrow, but she wants to speak to Dr. Servando Salina.

## 2021-10-31 NOTE — Telephone Encounter (Signed)
Caller Name: Fayola Meckes Call back phone #: [phone]  Reason for Call: Pt was calling for status. She is getting very anxious. Scared it is effecting heart

## 2021-10-31 NOTE — Telephone Encounter (Signed)
Spoke to patient she stated PCP prescribed Celexa.She cannot take caused nausea and made her feel crazy.She only took 3 pills.Advised she will need to call PCP.Stated she called PCP last week and has not received a call back.She wanted to make sure will not effect her heart.Advised should not effect her heart.Advised to call PCP and keep appointment already scheduled with Dr.Tobb 9/7 at 2:30 pm.I will make Dr.Tobb aware.

## 2021-11-02 ENCOUNTER — Encounter: Payer: Self-pay | Admitting: Cardiology

## 2021-11-02 ENCOUNTER — Ambulatory Visit: Payer: Medicare Other | Attending: Cardiology | Admitting: Cardiology

## 2021-11-02 VITALS — BP 134/90 | HR 84 | Ht 67.0 in

## 2021-11-02 DIAGNOSIS — I340 Nonrheumatic mitral (valve) insufficiency: Secondary | ICD-10-CM | POA: Diagnosis not present

## 2021-11-02 DIAGNOSIS — G459 Transient cerebral ischemic attack, unspecified: Secondary | ICD-10-CM | POA: Diagnosis not present

## 2021-11-02 DIAGNOSIS — I4819 Other persistent atrial fibrillation: Secondary | ICD-10-CM | POA: Diagnosis not present

## 2021-11-02 DIAGNOSIS — Z9889 Other specified postprocedural states: Secondary | ICD-10-CM

## 2021-11-02 DIAGNOSIS — Z8679 Personal history of other diseases of the circulatory system: Secondary | ICD-10-CM | POA: Diagnosis not present

## 2021-11-02 DIAGNOSIS — I351 Nonrheumatic aortic (valve) insufficiency: Secondary | ICD-10-CM

## 2021-11-02 DIAGNOSIS — Z79899 Other long term (current) drug therapy: Secondary | ICD-10-CM

## 2021-11-02 MED ORDER — METOLAZONE 5 MG PO TABS: ORAL_TABLET | ORAL | 0 refills | Status: DC

## 2021-11-02 NOTE — Progress Notes (Signed)
Cardiology Office Note:    Date:  11/03/2021   ID:  Kaitlyn Rodriguez, DOB Mar 01, 1933, MRN [id number]  PCP:  Mliss Sax, MD  Cardiologist:  Thomasene Ripple, DO  Electrophysiologist:  None   Referring MD: Mliss Sax,*    " I am having shortness of breath and leg swelling"  History of Present Illness:    Kaitlyn Rodriguez is a 88 y.o. female with a hx of repair of type a dissection in 2020, permanent atrial fibrillation on anticoagulation with Eliquis 5 mg twice daily, recently diagnosed depressed ejection fraction, history of CVA, presents today for follow-up visit.  Since also the patient in March 2023 she had seen Dr. Herbie Baltimore for worsening shortness of breath and leg swelling.  At that time her Lasix was optimized.  She is going back to taking her Lasix every other day and missing days.  Her son is concerned because she has gained some weight and close to 20 pounds with significant leg swelling.  She seems to be significantly depressed she is crying in the office.   Past Medical History:  Diagnosis Date   Anxiety disorder, unspecified    Benign essential hypertension    Dissection of unspecified site of aorta (HCC) 11/2018   Heart failure, unspecified (HCC)    History of aortic valve replacement    Hypothyroidism, unspecified    Unspecified osteoarthritis, unspecified site    Vitamin B12 deficiency anemia     Past Surgical History:  Procedure Laterality Date   ABDOMINAL HYSTERECTOMY     at age 30   REPLACEMENT ASCENDING AORTA N/A 12/20/2018   Procedure: REPAIR OF TYPE A AORTIC DISSECTION USING HEMASHIELD PLATINUM AND GRAFT;  Surgeon: Linden Dolin, MD;  Location: [facility] OR;  Service: Open Heart Surgery;  Laterality: N/A;   STERNAL CLOSURE  12/20/2018   Procedure: STERNAL PLATING;  Surgeon: Linden Dolin, MD;  Location: [facility] OR;  Service: Open Heart Surgery;;    Current Medications: Current Meds  Medication Sig   ALPRAZolam (XANAX) 0.25 MG tablet  Take 1 tablet (0.25 mg total) by mouth 2 (two) times daily as needed for anxiety.   apixaban (ELIQUIS) 5 MG TABS tablet Take 1 tablet by mouth twice daily   cyanocobalamin (,VITAMIN B-12,) 1000 MCG/ML injection Inject 1 mL (1,000 mcg total) into the muscle every 30 (thirty) days.   ferrous sulfate 325 (65 FE) MG tablet Take 1 tablet (325 mg total) by mouth every other day.   furosemide (LASIX) 40 MG tablet Take 1 tablet (40 mg total) by mouth every other day.   losartan (COZAAR) 25 MG tablet Take 1 tablet (25 mg total) by mouth daily.   metolazone (ZAROXOLYN) 5 MG tablet Take 30 minutes before first Lasix dose   metoprolol succinate (TOPROL-XL) 50 MG 24 hr tablet Take 1 tablet (50 mg total) by mouth in the morning and at bedtime. Take with or immediately following a meal.   Multiple Vitamin (MULTIVITAMIN WITH MINERALS) TABS tablet Take 1 tablet by mouth daily.   SYNTHROID 125 MCG tablet Take 125 mcg by mouth every morning.     Allergies:   Celexa [citalopram]   Social History   Socioeconomic History   Marital status: Widowed    Spouse name: Not on file   Number of children: Not on file   Years of education: Not on file   Highest education level: Not on file  Occupational History   Not on file  Tobacco Use  Smoking status: Never   Smokeless tobacco: Never  Vaping Use   Vaping Use: Never used  Substance and Sexual Activity   Alcohol use: Never   Drug use: Never   Sexual activity: Not Currently  Other Topics Concern   Not on file  Social History Narrative   Not on file   Social Determinants of Health   Financial Resource Strain: Not on file  Food Insecurity: Not on file  Transportation Needs: Not on file  Physical Activity: Not on file  Stress: Not on file  Social Connections: Not on file     Family History: The patient's family history includes Hypertension in her father.  ROS:   Review of Systems  Constitution: Negative for decreased appetite, fever and weight  gain.  HENT: Negative for congestion, ear discharge, hoarse voice and sore throat.   Eyes: Negative for discharge, redness, vision loss in right eye and visual halos.  Cardiovascular: Negative for chest pain, dyspnea on exertion, leg swelling, orthopnea and palpitations.  Respiratory: Negative for cough, hemoptysis, shortness of breath and snoring.   Endocrine: Negative for heat intolerance and polyphagia.  Hematologic/Lymphatic: Negative for bleeding problem. Does not bruise/bleed easily.  Skin: Negative for flushing, nail changes, rash and suspicious lesions.  Musculoskeletal: Negative for arthritis, joint pain, muscle cramps, myalgias, neck pain and stiffness.  Gastrointestinal: Negative for abdominal pain, bowel incontinence, diarrhea and excessive appetite.  Genitourinary: Negative for decreased libido, genital sores and incomplete emptying.  Neurological: Negative for brief paralysis, focal weakness, headaches and loss of balance.  Psychiatric/Behavioral: Negative for altered mental status, depression and suicidal ideas.  Allergic/Immunologic: Negative for HIV exposure and persistent infections.    EKGs/Labs/Other Studies Reviewed:    The following studies were reviewed today:   EKG:  None today    Echo 12/04/2020 IMPRESSIONS   1. Left ventricular ejection fraction, by estimation, is 35 to 40%. The left ventricle has moderately decreased function. The left ventricle has  no regional wall motion abnormalities. Left ventricular diastolic parameters are indeterminate.   2. Right ventricular systolic function is moderately reduced. The right ventricular size is mildly enlarged. There is mildly elevated pulmonary artery systolic pressure. The estimated right ventricular systolic pressure is 36.5 mmHg.   3. Left atrial size was severely dilated.   4. Right atrial size was severely dilated.   5. The mitral valve is normal in structure. Severe mitral valve  regurgitation. No evidence of  mitral stenosis.   6. Tricuspid valve regurgitation is moderate.   7. The aortic valve is calcified. Aortic valve regurgitation is mild.  Mild to moderate aortic valve sclerosis/calcification is present, without  any evidence of aortic stenosis.   8. Pulmonic valve regurgitation is moderate.   9. Aortic dilatation noted and root/ascending aorta has been  repaired/replaced. There is mild dilatation of the ascending aorta,  measuring 40 mm.  10. The inferior vena cava is normal in size with greater than 50%  respiratory variability, suggesting right atrial pressure of 3 mmHg.   FINDINGS   Left Ventricle: Left ventricular ejection fraction, by estimation, is 35  to 40%. The left ventricle has moderately decreased function. The left  ventricle has no regional wall motion abnormalities. The left ventricular  internal cavity size was normal in  size. There is no left ventricular hypertrophy. Left ventricular diastolic  parameters are indeterminate.      LV Wall Scoring:  The mid and distal inferior wall and mid inferolateral segment are  akinetic.   Right Ventricle:  The right ventricular size is mildly enlarged. No  increase in right ventricular wall thickness. Right ventricular systolic  function is moderately reduced. There is mildly elevated pulmonary artery  systolic pressure. The tricuspid  regurgitant velocity is 2.32 m/s, and with an assumed right atrial  pressure of 15 mmHg, the estimated right ventricular systolic pressure is  36.5 mmHg.   Left Atrium: Left atrial size was severely dilated.   Right Atrium: Right atrial size was severely dilated.   Pericardium: There is no evidence of pericardial effusion.   Mitral Valve: The mitral valve is normal in structure. Severe mitral valve  regurgitation, with posteriorly-directed jet. No evidence of mitral valve  stenosis.   Tricuspid Valve: The tricuspid valve is normal in structure. Tricuspid  valve regurgitation is moderate  . No evidence of tricuspid stenosis.   Aortic Valve: The aortic valve is calcified. Aortic valve regurgitation is  mild. Mild to moderate aortic valve sclerosis/calcification is present,  without any evidence of aortic stenosis.   Pulmonic Valve: The pulmonic valve was normal in structure. Pulmonic valve  regurgitation is moderate. No evidence of pulmonic stenosis.   Aorta: Aortic dilatation noted and the aortic root/ascending aorta has  been repaired/replaced. There is mild dilatation of the ascending aorta,  measuring 40 mm.   Venous: The inferior vena cava is normal in size with greater than 50%  respiratory variability, suggesting right atrial pressure of 3 mmHg.   IAS/Shunts: No atrial level shunt detected by color flow Doppler.   Recent Labs: 07/03/2021: TSH 1.22 11/02/2021: ALT 13; BUN 11; Creatinine, Ser 0.78; Hemoglobin 15.3; Magnesium 1.6; NT-Pro BNP 3,040; Platelets 196; Potassium 4.8; Sodium 123  Recent Lipid Panel    Component Value Date/Time   CHOL 154 12/04/2020 0422   TRIG 72 12/04/2020 0422   HDL 46 12/04/2020 0422   CHOLHDL 3.3 12/04/2020 0422   VLDL 14 12/04/2020 0422   LDLCALC 94 12/04/2020 0422    Physical Exam:    VS:  BP (!) 134/90   Pulse 84   Ht 5\' 7"  (1.702 m)   SpO2 93%   BMI 33.83 kg/m     Wt Readings from Last 3 Encounters:  09/26/21 216 lb (98 kg)  08/14/21 216 lb (98 kg)  07/03/21 210 lb (95.3 kg)     GEN: Well nourished, well developed in no acute distress HEENT: Normal NECK: No JVD; No carotid bruits LYMPHATICS: No lymphadenopathy CARDIAC: S1S2 noted,RRR, no murmurs, rubs, gallops RESPIRATORY:  Clear to auscultation without rales, wheezing or rhonchi  ABDOMEN: Soft, non-tender, non-distended, +bowel sounds, no guarding. EXTREMITIES: +3 bilateral pretibial edema, No cyanosis, no clubbing MUSCULOSKELETAL:  No deformity  SKIN: Warm and dry NEUROLOGIC:  Alert and oriented x 3, non-focal PSYCHIATRIC:  Normal affect, good  insight  ASSESSMENT:    1. Medication management   2. Persistent atrial fibrillation (HCC)   3. Nonrheumatic mitral valve regurgitation   4. Nonrheumatic aortic valve insufficiency   5. TIA (transient ischemic attack)   6. S/P aortic aneurysm repair    PLAN:     Likely she appears to be volume overloaded.  With significant lower extremity edema.  Ideally the patient should be admitted for IV diuretics but she prefers monitor.  And also she has not been taking her diuretics as prescribed so we will try giving the patient Lasix 40 mg twice a day for the next week with 3 days of metolazone 5 mg 30 minutes before her first Lasix dosing.  I am hoping  that we can get some improvement on this.  I will see the patient back on September 19.  If she has not improved I will send her to the emergency department to be admitted for IV diuretics.  She is severely depressed.  She needs to be optimized on her antidepressant.  Her PCP has been taking care of this.  Her blood pressure is acceptable in the office today.   The patient is in agreement with the above plan. The patient left the office in stable condition.  The patient will follow up in   Medication Adjustments/Labs and Tests Ordered: Current medicines are reviewed at length with the patient today.  Concerns regarding medicines are outlined above.  Orders Placed This Encounter  Procedures   Comprehensive Metabolic Panel (CMET)   Pro b natriuretic peptide (BNP)   CBC with Differential/Platelet   Magnesium   Meds ordered this encounter  Medications   metolazone (ZAROXOLYN) 5 MG tablet    Sig: Take 30 minutes before first Lasix dose    Dispense:  3 tablet    Refill:  0    Patient Instructions  Medication Instructions:  Your physician has recommended you make the following change in your medication:  FOR 3 DOSES: Metolazone 5 mg (take 30 minutes before first Lasix dose) TAKE: Lasix 40 mg (take twice daily for 5 days then back to 40  mg once daily)   *If you need a refill on your cardiac medications before your next appointment, please call your pharmacy*   Lab Work: TODAY: CMET, Mag, BNP, CBC If you have labs (blood work) drawn today and your tests are completely normal, you will receive your results only by: MyChart Message (if you have MyChart) OR A paper copy in the mail If you have any lab test that is abnormal or we need to change your treatment, we will call you to review the results.   Testing/Procedures: None   Follow-Up: At [facility], you and your health needs are our priority.  As part of our continuing mission to provide you with exceptional heart care, we have created designated Provider Care Teams.  These Care Teams include your primary Cardiologist (physician) and Advanced Practice Providers (APPs -  Physician Assistants and Nurse Practitioners) who all work together to provide you with the care you need, when you need it.  We recommend signing up for the patient portal called "MyChart".  Sign up information is provided on this After Visit Summary.  MyChart is used to connect with patients for Virtual Visits (Telemedicine).  Patients are able to view lab/test results, encounter notes, upcoming appointments, etc.  Non-urgent messages can be sent to your provider as well.   To learn more about what you can do with MyChart, go to [email or web address].    Your next appointment:   2 week(s)  The format for your next appointment:   In Person  Provider:   Thomasene Ripple, DO     Other Instructions   Important Information About Sugar         Adopting a Healthy Lifestyle.  Know what a healthy weight is for you (roughly BMI <25) and aim to maintain this   Aim for 7+ servings of fruits and vegetables daily   65-80+ fluid ounces of water or unsweet tea for healthy kidneys   Limit to max 1 drink of alcohol per day; avoid smoking/tobacco   Limit animal fats in diet for cholesterol  and heart health - choose grass fed  whenever available   Avoid highly processed foods, and foods high in saturated/trans fats   Aim for low stress - take time to unwind and care for your mental health   Aim for 150 min of moderate intensity exercise weekly for heart health, and weights twice weekly for bone health   Aim for 7-9 hours of sleep daily   When it comes to diets, agreement about the perfect plan isnt easy to find, even among the experts. Experts at the [facility] [employer] developed an idea known as the Healthy Eating Plate. Just imagine a plate divided into logical, healthy portions.   The emphasis is on diet quality:   Load up on vegetables and fruits - one-half of your plate: Aim for color and variety, and remember that potatoes dont count.   Go for whole grains - one-quarter of your plate: Whole wheat, barley, wheat berries, quinoa, oats, brown rice, and foods made with them. If you want pasta, go with whole wheat pasta.   Protein power - one-quarter of your plate: Fish, chicken, beans, and nuts are all healthy, versatile protein sources. Limit red meat.   The diet, however, does go beyond the plate, offering a few other suggestions.   Use healthy plant oils, such as olive, canola, soy, corn, sunflower and peanut. Check the labels, and avoid partially hydrogenated oil, which have unhealthy trans fats.   If youre thirsty, drink water. Coffee and tea are good in moderation, but skip sugary drinks and limit milk and dairy products to one or two daily servings.   The type of carbohydrate in the diet is more important than the amount. Some sources of carbohydrates, such as vegetables, fruits, whole grains, and beans-are healthier than others.   Finally, stay active  Signed, Thomasene Ripple, DO  11/03/2021 7:56 AM    [facility]

## 2021-11-02 NOTE — Telephone Encounter (Signed)
Called patient to go over medication and concerns no answer LMTCB

## 2021-11-02 NOTE — Patient Instructions (Addendum)
Medication Instructions:  Your physician has recommended you make the following change in your medication:  FOR 3 DOSES: Metolazone 5 mg (take 30 minutes before first Lasix dose) TAKE: Lasix 40 mg (take twice daily for 5 days then back to 40 mg once daily)   *If you need a refill on your cardiac medications before your next appointment, please call your pharmacy*   Lab Work: TODAY: CMET, Mag, BNP, CBC If you have labs (blood work) drawn today and your tests are completely normal, you will receive your results only by: MyChart Message (if you have MyChart) OR A paper copy in the mail If you have any lab test that is abnormal or we need to change your treatment, we will call you to review the results.   Testing/Procedures: None   Follow-Up: At [facility], you and your health needs are our priority.  As part of our continuing mission to provide you with exceptional heart care, we have created designated Provider Care Teams.  These Care Teams include your primary Cardiologist (physician) and Advanced Practice Providers (APPs -  Physician Assistants and Nurse Practitioners) who all work together to provide you with the care you need, when you need it.  We recommend signing up for the patient portal called "MyChart".  Sign up information is provided on this After Visit Summary.  MyChart is used to connect with patients for Virtual Visits (Telemedicine).  Patients are able to view lab/test results, encounter notes, upcoming appointments, etc.  Non-urgent messages can be sent to your provider as well.   To learn more about what you can do with MyChart, go to [email or web address].    Your next appointment:   2 week(s)  The format for your next appointment:   In Person  Provider:   Thomasene Ripple, DO     Other Instructions   Important Information About Sugar

## 2021-11-03 ENCOUNTER — Telehealth: Payer: Self-pay | Admitting: Cardiology

## 2021-11-03 LAB — CBC WITH DIFFERENTIAL/PLATELET
Basophils Absolute: 0 10*3/uL (ref 0.0–0.2)
Basos: 0 %
EOS (ABSOLUTE): 0.1 10*3/uL (ref 0.0–0.4)
Eos: 1 %
Hematocrit: 44.1 % (ref 34.0–46.6)
Hemoglobin: 15.3 g/dL (ref 11.1–15.9)
Immature Grans (Abs): 0 10*3/uL (ref 0.0–0.1)
Immature Granulocytes: 0 %
Lymphocytes Absolute: 0.6 10*3/uL — ABNORMAL LOW (ref 0.7–3.1)
Lymphs: 8 %
MCH: 31.8 pg (ref 26.6–33.0)
MCHC: 34.7 g/dL (ref 31.5–35.7)
MCV: 92 fL (ref 79–97)
Monocytes Absolute: 0.7 10*3/uL (ref 0.1–0.9)
Monocytes: 8 %
Neutrophils Absolute: 6.7 10*3/uL (ref 1.4–7.0)
Neutrophils: 83 %
Platelets: 196 10*3/uL (ref 150–450)
RBC: 4.81 x10E6/uL (ref 3.77–5.28)
RDW: 12.4 % (ref 11.7–15.4)
WBC: 8 10*3/uL (ref 3.4–10.8)

## 2021-11-03 LAB — COMPREHENSIVE METABOLIC PANEL
ALT: 13 IU/L (ref 0–32)
AST: 21 IU/L (ref 0–40)
Albumin/Globulin Ratio: 1.6 (ref 1.2–2.2)
Albumin: 3.9 g/dL (ref 3.7–4.7)
Alkaline Phosphatase: 79 IU/L (ref 44–121)
BUN/Creatinine Ratio: 14 (ref 12–28)
BUN: 11 mg/dL (ref 8–27)
Bilirubin Total: 0.7 mg/dL (ref 0.0–1.2)
CO2: 28 mmol/L (ref 20–29)
Calcium: 9.3 mg/dL (ref 8.7–10.3)
Chloride: 83 mmol/L — ABNORMAL LOW (ref 96–106)
Creatinine, Ser: 0.78 mg/dL (ref 0.57–1.00)
Globulin, Total: 2.5 g/dL (ref 1.5–4.5)
Glucose: 91 mg/dL (ref 70–99)
Potassium: 4.8 mmol/L (ref 3.5–5.2)
Sodium: 123 mmol/L — ABNORMAL LOW (ref 134–144)
Total Protein: 6.4 g/dL (ref 6.0–8.5)
eGFR: 74 mL/min/{1.73_m2} (ref >59)

## 2021-11-03 LAB — PRO B NATRIURETIC PEPTIDE: NT-Pro BNP: 3040 pg/mL — ABNORMAL HIGH (ref 0–738)

## 2021-11-03 LAB — MAGNESIUM: Magnesium: 1.6 mg/dL (ref 1.6–2.3)

## 2021-11-03 NOTE — Telephone Encounter (Signed)
Calling in for lab results. Please advise

## 2021-11-03 NOTE — Telephone Encounter (Signed)
Spoke with pt son, aware of results and dr tobb's recommendations.

## 2021-11-06 NOTE — Telephone Encounter (Signed)
Returned call no answer LMTCB 

## 2021-11-13 ENCOUNTER — Telehealth: Payer: Self-pay | Admitting: Cardiology

## 2021-11-13 ENCOUNTER — Ambulatory Visit: Payer: Medicare Other | Attending: Cardiology | Admitting: Cardiology

## 2021-11-13 ENCOUNTER — Encounter: Payer: Self-pay | Admitting: Cardiology

## 2021-11-13 VITALS — BP 149/88 | HR 81 | Ht 67.0 in | Wt 215.8 lb

## 2021-11-13 DIAGNOSIS — I5042 Chronic combined systolic (congestive) and diastolic (congestive) heart failure: Secondary | ICD-10-CM | POA: Diagnosis not present

## 2021-11-13 DIAGNOSIS — Z79899 Other long term (current) drug therapy: Secondary | ICD-10-CM | POA: Diagnosis not present

## 2021-11-13 MED ORDER — FUROSEMIDE 40 MG PO TABS: ORAL_TABLET | ORAL | 3 refills | Status: DC

## 2021-11-13 MED ORDER — METOLAZONE 5 MG PO TABS: 5.0000 mg | ORAL_TABLET | ORAL | 3 refills | Status: DC

## 2021-11-13 NOTE — Telephone Encounter (Signed)
Pt c/o medication issue:  1. Name of Medication:  furosemide (LASIX) 40 MG tablet  2. How are you currently taking this medication (dosage and times per day)?   3. Are you having a reaction (difficulty breathing--STAT)?   4. What is your medication issue?   April with [facility] is requesting clarification on medication instructions.   Phone#: [phone]

## 2021-11-13 NOTE — Patient Instructions (Signed)
Medication Instructions:  Your physician has recommended you make the following change in your medication:  START: Lasix 40 mg twice daily every other day, 40 mg on the other days START: Metolazone 5 mg once weekly.  *If you need a refill on your cardiac medications before your next appointment, please call your pharmacy*   Lab Work: TODAY: CMET, Mag If you have labs (blood work) drawn today and your tests are completely normal, you will receive your results only by: [place] (if you have MyChart) OR A paper copy in the mail If you have any lab test that is abnormal or we need to change your treatment, we will call you to review the results.   Testing/Procedures: None   Follow-Up: At [facility], you and your health needs are our priority.  As part of our continuing mission to provide you with exceptional heart care, we have created designated Provider Care Teams.  These Care Teams include your primary Cardiologist (physician) and Advanced Practice Providers (APPs -  Physician Assistants and Nurse Practitioners) who all work together to provide you with the care you need, when you need it.  We recommend signing up for the patient portal called "MyChart".  Sign up information is provided on this After Visit Summary.  MyChart is used to connect with patients for Virtual Visits (Telemedicine).  Patients are able to view lab/test results, encounter notes, upcoming appointments, etc.  Non-urgent messages can be sent to your provider as well.   To learn more about what you can do with MyChart, go to [email or web address].    Your next appointment:   3 month(s)  The format for your next appointment:   In Person  Provider:   Berniece Salines, DO     Other Instructions   Important Information About Sugar

## 2021-11-13 NOTE — Telephone Encounter (Signed)
Spoke with April at [facility] regarding pt's lasix prescription. Pt to take lasix 40mg  twice daily every other day and 40mg  once daily every other day. April verbalizes understanding.

## 2021-11-13 NOTE — Progress Notes (Signed)
Cardiology Office Note:    Date:  11/13/2021   ID:  Kaitlyn HenleFaye C Kurtz, DOB January 06, 1934, MRN [id number]  PCP:  Mliss SaxKremer, William Alfred, MD  Cardiologist:  Thomasene RippleKardie Jaydin Jalomo, DO  Electrophysiologist:  None   Referring MD: Mliss SaxKremer, William Alfred,*   " I am doing well"  History of Present Illness:    Kaitlyn Rodriguez is a 88 y.o. female with a hx of  repair of type a dissection in 2020, permanent atrial fibrillation on anticoagulation with Eliquis 5 mg twice daily, recently diagnosed depressed ejection fraction, history of CVA, presents today for follow-up visit.  I saw the patient on 11/02/2021 at that time she had significant fluid overload.  I increased her Lasix to twice a day for 5 days and added metolazone 5 mg x 3 days. Over the last several days she will has been able to have a significant fluid output but in has lost close to 15 pounds.  She is very happy.     Past Medical History:  Diagnosis Date   Anxiety disorder, unspecified    Benign essential hypertension    Dissection of unspecified site of aorta (HCC) 11/2018   Heart failure, unspecified (HCC)    History of aortic valve replacement    Hypothyroidism, unspecified    Unspecified osteoarthritis, unspecified site    Vitamin B12 deficiency anemia     Past Surgical History:  Procedure Laterality Date   ABDOMINAL HYSTERECTOMY     at age 88   REPLACEMENT ASCENDING AORTA N/A 12/20/2018   Procedure: REPAIR OF TYPE A AORTIC DISSECTION USING HEMASHIELD PLATINUM 28MM AND 32MM GRAFT;  Surgeon: Linden DolinAtkins, Broadus Z, MD;  Location: [facility] OR;  Service: Open Heart Surgery;  Laterality: N/A;   STERNAL CLOSURE  12/20/2018   Procedure: STERNAL PLATING;  Surgeon: Linden DolinAtkins, Broadus Z, MD;  Location: [facility] OR;  Service: Open Heart Surgery;;    Current Medications: Current Meds  Medication Sig   ALPRAZolam (XANAX) 0.25 MG tablet Take 1 tablet (0.25 mg total) by mouth 2 (two) times daily as needed for anxiety.   apixaban (ELIQUIS) 5 MG TABS tablet Take 1  tablet by mouth twice daily   cyanocobalamin (,VITAMIN B-12,) 1000 MCG/ML injection Inject 1 mL (1,000 mcg total) into the muscle every 30 (thirty) days.   ferrous sulfate 325 (65 FE) MG tablet Take 1 tablet (325 mg total) by mouth every other day.   furosemide (LASIX) 40 MG tablet Take 1 tablet (40 mg total) by mouth twice daily every other day then 40 mg on odd days   losartan (COZAAR) 25 MG tablet Take 1 tablet (25 mg total) by mouth daily.   metolazone (ZAROXOLYN) 5 MG tablet Take 1 tablet (5 mg total) by mouth once a week. Take 30 minutes before first Lasix dose   metoprolol succinate (TOPROL-XL) 50 MG 24 hr tablet Take 1 tablet (50 mg total) by mouth in the morning and at bedtime. Take with or immediately following a meal.   Multiple Vitamin (MULTIVITAMIN WITH MINERALS) TABS tablet Take 1 tablet by mouth daily.   SYNTHROID 125 MCG tablet Take 125 mcg by mouth every morning.   [DISCONTINUED] furosemide (LASIX) 40 MG tablet Take 1 tablet (40 mg total) by mouth every other day.   [DISCONTINUED] metolazone (ZAROXOLYN) 5 MG tablet Take 30 minutes before first Lasix dose     Allergies:   Celexa [citalopram]   Social History   Socioeconomic History   Marital status: Widowed    Spouse name: Not on  file   Number of children: Not on file   Years of education: Not on file   Highest education level: Not on file  Occupational History   Not on file  Tobacco Use   Smoking status: Never   Smokeless tobacco: Never  Vaping Use   Vaping Use: Never used  Substance and Sexual Activity   Alcohol use: Never   Drug use: Never   Sexual activity: Not Currently  Other Topics Concern   Not on file  Social History Narrative   Not on file   Social Determinants of Health   Financial Resource Strain: Not on file  Food Insecurity: Not on file  Transportation Needs: Not on file  Physical Activity: Not on file  Stress: Not on file  Social Connections: Not on file     Family History: The  patient's family history includes Hypertension in her father.  ROS:   Review of Systems  Constitution: Negative for decreased appetite, fever and weight gain.  HENT: Negative for congestion, ear discharge, hoarse voice and sore throat.   Eyes: Negative for discharge, redness, vision loss in right eye and visual halos.  Cardiovascular: Negative for chest pain, dyspnea on exertion, leg swelling, orthopnea and palpitations.  Respiratory: Negative for cough, hemoptysis, shortness of breath and snoring.   Endocrine: Negative for heat intolerance and polyphagia.  Hematologic/Lymphatic: Negative for bleeding problem. Does not bruise/bleed easily.  Skin: Negative for flushing, nail changes, rash and suspicious lesions.  Musculoskeletal: Negative for arthritis, joint pain, muscle cramps, myalgias, neck pain and stiffness.  Gastrointestinal: Negative for abdominal pain, bowel incontinence, diarrhea and excessive appetite.  Genitourinary: Negative for decreased libido, genital sores and incomplete emptying.  Neurological: Negative for brief paralysis, focal weakness, headaches and loss of balance.  Psychiatric/Behavioral: Negative for altered mental status, depression and suicidal ideas.  Allergic/Immunologic: Negative for HIV exposure and persistent infections.    EKGs/Labs/Other Studies Reviewed:    The following studies were reviewed today:   EKG: None today  Echo 12/04/2020 IMPRESSIONS   1. Left ventricular ejection fraction, by estimation, is 35 to 40%. The left ventricle has moderately decreased function. The left ventricle has  no regional wall motion abnormalities. Left ventricular diastolic parameters are indeterminate.   2. Right ventricular systolic function is moderately reduced. The right ventricular size is mildly enlarged. There is mildly elevated pulmonary artery systolic pressure. The estimated right ventricular systolic pressure is 36.5 mmHg.   3. Left atrial size was severely  dilated.   4. Right atrial size was severely dilated.   5. The mitral valve is normal in structure. Severe mitral valve  regurgitation. No evidence of mitral stenosis.   6. Tricuspid valve regurgitation is moderate.   7. The aortic valve is calcified. Aortic valve regurgitation is mild.  Mild to moderate aortic valve sclerosis/calcification is present, without  any evidence of aortic stenosis.   8. Pulmonic valve regurgitation is moderate.   9. Aortic dilatation noted and root/ascending aorta has been  repaired/replaced. There is mild dilatation of the ascending aorta,  measuring 40 mm.  10. The inferior vena cava is normal in size with greater than 50%  respiratory variability, suggesting right atrial pressure of 3 mmHg.   FINDINGS   Left Ventricle: Left ventricular ejection fraction, by estimation, is 35  to 40%. The left ventricle has moderately decreased function. The left  ventricle has no regional wall motion abnormalities. The left ventricular  internal cavity size was normal in  size. There is no left  ventricular hypertrophy. Left ventricular diastolic  parameters are indeterminate.      LV Wall Scoring:  The mid and distal inferior wall and mid inferolateral segment are  akinetic.   Right Ventricle: The right ventricular size is mildly enlarged. No  increase in right ventricular wall thickness. Right ventricular systolic  function is moderately reduced. There is mildly elevated pulmonary artery  systolic pressure. The tricuspid  regurgitant velocity is 2.32 m/s, and with an assumed right atrial  pressure of 15 mmHg, the estimated right ventricular systolic pressure is  36.5 mmHg.   Left Atrium: Left atrial size was severely dilated.   Right Atrium: Right atrial size was severely dilated.   Pericardium: There is no evidence of pericardial effusion.   Mitral Valve: The mitral valve is normal in structure. Severe mitral valve  regurgitation, with posteriorly-directed  jet. No evidence of mitral valve  stenosis.   Tricuspid Valve: The tricuspid valve is normal in structure. Tricuspid  valve regurgitation is moderate . No evidence of tricuspid stenosis.   Aortic Valve: The aortic valve is calcified. Aortic valve regurgitation is  mild. Mild to moderate aortic valve sclerosis/calcification is present,  without any evidence of aortic stenosis.   Pulmonic Valve: The pulmonic valve was normal in structure. Pulmonic valve  regurgitation is moderate. No evidence of pulmonic stenosis.   Aorta: Aortic dilatation noted and the aortic root/ascending aorta has  been repaired/replaced. There is mild dilatation of the ascending aorta,  measuring 40 mm.   Venous: The inferior vena cava is normal in size with greater than 50%  respiratory variability, suggesting right atrial pressure of 3 mmHg.   IAS/Shunts: No atrial level shunt detected by color flow Doppler  Recent Labs: 07/03/2021: TSH 1.22 11/02/2021: ALT 13; BUN 11; Creatinine, Ser 0.78; Hemoglobin 15.3; Magnesium 1.6; NT-Pro BNP 3,040; Platelets 196; Potassium 4.8; Sodium 123  Recent Lipid Panel    Component Value Date/Time   CHOL 154 12/04/2020 0422   TRIG 72 12/04/2020 0422   HDL 46 12/04/2020 0422   CHOLHDL 3.3 12/04/2020 0422   VLDL 14 12/04/2020 0422   LDLCALC 94 12/04/2020 0422    Physical Exam:    VS:  BP (!) 149/88   Pulse 81   Ht 5\' 7"  (1.702 m)   Wt 215 lb 12.8 oz (97.9 kg)   SpO2 95%   BMI 33.80 kg/m     Wt Readings from Last 3 Encounters:  11/13/21 215 lb 12.8 oz (97.9 kg)  09/26/21 216 lb (98 kg)  08/14/21 216 lb (98 kg)     GEN: Well nourished, well developed in no acute distress HEENT: Normal NECK: No JVD; No carotid bruits LYMPHATICS: No lymphadenopathy CARDIAC: S1S2 noted,RRR, no murmurs, rubs, gallops RESPIRATORY:  Clear to auscultation without rales, wheezing or rhonchi  ABDOMEN: Soft, non-tender, non-distended, +bowel sounds, no guarding. EXTREMITIES: +1 bilaterally  extremity edema, No cyanosis, no clubbing MUSCULOSKELETAL:  No deformity  SKIN: Warm and dry NEUROLOGIC:  Alert and oriented x 3, non-focal PSYCHIATRIC:  Normal affect, good insight  ASSESSMENT:    1. Medication management   2. Chronic combined systolic and diastolic congestive heart failure (HCC)    PLAN:    Thankfully she has responded favorably to her increased dose of Lasix.  As well as the metolazone.  There is no need to have the patient be admitted for IV diuretics.  She takes Lasix 40 mg twice daily every other day and then subsequent days would be once daily.  Blood work  will be done today.  The patient is in agreement with the above plan. The patient eft the office in stable condition.  The patient will follow up in 3 months or sooner if needed.   Medication Adjustments/Labs and Tests Ordered: Current medicines are reviewed at length with the patient today.  Concerns regarding medicines are outlined above.  Orders Placed This Encounter  Procedures   Comprehensive Metabolic Panel (CMET)   Magnesium   Meds ordered this encounter  Medications   furosemide (LASIX) 40 MG tablet    Sig: Take 1 tablet (40 mg total) by mouth twice daily every other day then 40 mg on odd days    Dispense:  90 tablet    Refill:  3   metolazone (ZAROXOLYN) 5 MG tablet    Sig: Take 1 tablet (5 mg total) by mouth once a week. Take 30 minutes before first Lasix dose    Dispense:  10 tablet    Refill:  3    Patient Instructions  Medication Instructions:  Your physician has recommended you make the following change in your medication:  START: Lasix 40 mg twice daily every other day, 40 mg on the other days START: Metolazone 5 mg once weekly.  *If you need a refill on your cardiac medications before your next appointment, please call your pharmacy*   Lab Work: TODAY: CMET, Mag If you have labs (blood work) drawn today and your tests are completely normal, you will receive your results only  by: [place] (if you have MyChart) OR A paper copy in the mail If you have any lab test that is abnormal or we need to change your treatment, we will call you to review the results.   Testing/Procedures: None   Follow-Up: At [facility], you and your health needs are our priority.  As part of our continuing mission to provide you with exceptional heart care, we have created designated Provider Care Teams.  These Care Teams include your primary Cardiologist (physician) and Advanced Practice Providers (APPs -  Physician Assistants and Nurse Practitioners) who all work together to provide you with the care you need, when you need it.  We recommend signing up for the patient portal called "MyChart".  Sign up information is provided on this After Visit Summary.  MyChart is used to connect with patients for Virtual Visits (Telemedicine).  Patients are able to view lab/test results, encounter notes, upcoming appointments, etc.  Non-urgent messages can be sent to your provider as well.   To learn more about what you can do with MyChart, go to [email or web address].    Your next appointment:   3 month(s)  The format for your next appointment:   In Person  Provider:   Berniece Salines, DO     Other Instructions   Important Information About Sugar         Adopting a Healthy Lifestyle.  Know what a healthy weight is for you (roughly BMI <25) and aim to maintain this   Aim for 7+ servings of fruits and vegetables daily   65-80+ fluid ounces of water or unsweet tea for healthy kidneys   Limit to max 1 drink of alcohol per day; avoid smoking/tobacco   Limit animal fats in diet for cholesterol and heart health - choose grass fed whenever available   Avoid highly processed foods, and foods high in saturated/trans fats   Aim for low stress - take time to unwind and care for your mental health  Aim for 150 min of moderate intensity exercise weekly for heart health, and  weights twice weekly for bone health   Aim for 7-9 hours of sleep daily   When it comes to diets, agreement about the perfect plan isnt easy to find, even among the experts. Experts at the [facility] [employer] developed an idea known as the Healthy Eating Plate. Just imagine a plate divided into logical, healthy portions.   The emphasis is on diet quality:   Load up on vegetables and fruits - one-half of your plate: Aim for color and variety, and remember that potatoes dont count.   Go for whole grains - one-quarter of your plate: Whole wheat, barley, wheat berries, quinoa, oats, brown rice, and foods made with them. If you want pasta, go with whole wheat pasta.   Protein power - one-quarter of your plate: Fish, chicken, beans, and nuts are all healthy, versatile protein sources. Limit red meat.   The diet, however, does go beyond the plate, offering a few other suggestions.   Use healthy plant oils, such as olive, canola, soy, corn, sunflower and peanut. Check the labels, and avoid partially hydrogenated oil, which have unhealthy trans fats.   If youre thirsty, drink water. Coffee and tea are good in moderation, but skip sugary drinks and limit milk and dairy products to one or two daily servings.   The type of carbohydrate in the diet is more important than the amount. Some sources of carbohydrates, such as vegetables, fruits, whole grains, and beans-are healthier than others.   Finally, stay active  Signed, Thomasene Ripple, DO  11/13/2021 4:54 PM    [facility]

## 2021-11-14 LAB — MAGNESIUM: Magnesium: 1.6 mg/dL (ref 1.6–2.3)

## 2021-11-14 LAB — COMPREHENSIVE METABOLIC PANEL
ALT: 15 IU/L (ref 0–32)
AST: 23 IU/L (ref 0–40)
Albumin/Globulin Ratio: 1.5 (ref 1.2–2.2)
Albumin: 4.1 g/dL (ref 3.7–4.7)
Alkaline Phosphatase: 79 IU/L (ref 44–121)
BUN/Creatinine Ratio: 20 (ref 12–28)
BUN: 16 mg/dL (ref 8–27)
Bilirubin Total: 0.8 mg/dL (ref 0.0–1.2)
CO2: 31 mmol/L — ABNORMAL HIGH (ref 20–29)
Calcium: 9.3 mg/dL (ref 8.7–10.3)
Chloride: 81 mmol/L — ABNORMAL LOW (ref 96–106)
Creatinine, Ser: 0.81 mg/dL (ref 0.57–1.00)
Globulin, Total: 2.7 g/dL (ref 1.5–4.5)
Glucose: 98 mg/dL (ref 70–99)
Potassium: 4.5 mmol/L (ref 3.5–5.2)
Sodium: 124 mmol/L — ABNORMAL LOW (ref 134–144)
Total Protein: 6.8 g/dL (ref 6.0–8.5)
eGFR: 71 mL/min/{1.73_m2} (ref >59)

## 2021-11-17 ENCOUNTER — Telehealth: Payer: Self-pay | Admitting: Cardiology

## 2021-11-17 DIAGNOSIS — E871 Hypo-osmolality and hyponatremia: Secondary | ICD-10-CM

## 2021-11-17 NOTE — Telephone Encounter (Signed)
Spoke with pt regarding lab results from 9/18. Advised pt that her sodium is still low. Encouraged pt to eat well and add possibly add a small amount of salt to her diet. Pt states that she tends to not have much salt in her diet but will try to incorporate some over the next few weeks. Explained to pt that she needs to redo blood work in 2 weeks to recheck sodium levels. Orders placed and mailed to pt home address along with a copy of her lab work. Pt verbalizes understanding.

## 2021-11-17 NOTE — Telephone Encounter (Signed)
Follow Up:    Patient says she would like a call, to go over lab results form 11-13-21. She also would like a copy mailed to her please.

## 2021-12-04 ENCOUNTER — Other Ambulatory Visit: Payer: Self-pay | Admitting: Cardiology

## 2021-12-04 NOTE — Telephone Encounter (Signed)
Prescription refill request for Eliquis received. Indication:Afib Last office visit:9/23 Scr:0.8 Age: 88 Weight:97.9 kg  Prescription refilled

## 2021-12-07 ENCOUNTER — Telehealth: Payer: Self-pay | Admitting: Family Medicine

## 2021-12-07 DIAGNOSIS — L304 Erythema intertrigo: Secondary | ICD-10-CM

## 2021-12-07 NOTE — Telephone Encounter (Signed)
Caller Name: Rehema Muffley Call back phone #: [phone]  Reason for Call: Was hoping to get nystatin powder re-prescribed to her. She originally got it from the hospital and says it helps her a lot.

## 2021-12-08 MED ORDER — NYSTATIN 100000 UNIT/GM EX POWD: 1.0000 | Freq: Two times a day (BID) | CUTANEOUS | 0 refills | Status: DC

## 2021-12-08 NOTE — Telephone Encounter (Signed)
Pt is checking on status of this message. Pt says she is broken out under her breast and needs this powder before the weekend. Please advise pt @ [phone]

## 2021-12-08 NOTE — Telephone Encounter (Signed)
Please advise message below patient calling states that she seems to be a little raw under breast would like Nystatin powder sent in for this states she can not come in right now. Original prescription was sent in by different Provider. Please advise.

## 2021-12-18 ENCOUNTER — Other Ambulatory Visit: Payer: Self-pay | Admitting: Cardiology

## 2021-12-28 ENCOUNTER — Other Ambulatory Visit: Payer: Self-pay | Admitting: Family Medicine

## 2021-12-28 DIAGNOSIS — F418 Other specified anxiety disorders: Secondary | ICD-10-CM

## 2022-01-01 ENCOUNTER — Encounter: Payer: Self-pay | Admitting: Family Medicine

## 2022-01-01 ENCOUNTER — Ambulatory Visit: Payer: Medicare Other | Admitting: Family Medicine

## 2022-01-01 VITALS — BP 121/73 | HR 80 | Temp 98.3°F | Ht 67.0 in | Wt 213.0 lb

## 2022-01-01 DIAGNOSIS — L280 Lichen simplex chronicus: Secondary | ICD-10-CM

## 2022-01-01 DIAGNOSIS — F419 Anxiety disorder, unspecified: Secondary | ICD-10-CM

## 2022-01-01 DIAGNOSIS — E871 Hypo-osmolality and hyponatremia: Secondary | ICD-10-CM

## 2022-01-01 DIAGNOSIS — Z23 Encounter for immunization: Secondary | ICD-10-CM | POA: Diagnosis not present

## 2022-01-01 HISTORY — DX: Lichen simplex chronicus: L28.0

## 2022-01-01 HISTORY — DX: Anxiety disorder, unspecified: F41.9

## 2022-01-01 MED ORDER — TRIAMCINOLONE ACETONIDE 0.5 % EX OINT: 1.0000 | TOPICAL_OINTMENT | Freq: Two times a day (BID) | CUTANEOUS | 0 refills | Status: AC

## 2022-01-01 MED ORDER — ALPRAZOLAM 0.25 MG PO TABS: 0.2500 mg | ORAL_TABLET | Freq: Two times a day (BID) | ORAL | 0 refills | Status: DC | PRN

## 2022-01-01 NOTE — Progress Notes (Signed)
Established Patient Office Visit  Subjective   Patient ID: Kaitlyn Rodriguez, female    DOB: Jun 24, 1933  Age: 88 y.o. MRN: [id number]  Chief Complaint  Patient presents with   Acute Visit    Pt c/o rash on bottom of both legs near ankles. Itchy sometimes x 3 weeks.    HPI follow-up of an intensely pruritic rash on her lower legs, anxiety, hyponatremia.  Her cardiologist asked her to have sodium checked again.  Its been running low.  She is alternating doses of Lasix once weekly Bumex.  She has tried 1% hydrocortisone cream and Vicks vapor rub for her rash.  The VapoRub did help the itch.  Xanax is helping her anxiety.  She has not been able to tolerate the Lexapro or fluoxetine due to nausea.  She is tolerating Xanax well twice daily.  She sleeps in a recliner.    Review of Systems  Constitutional: Negative.   HENT: Negative.    Eyes:  Negative for blurred vision, discharge and redness.  Respiratory: Negative.    Cardiovascular: Negative.   Gastrointestinal:  Negative for abdominal pain.  Genitourinary: Negative.   Musculoskeletal: Negative.  Negative for myalgias.  Skin:  Positive for itching and rash.  Neurological:  Negative for tingling, loss of consciousness and weakness.  Endo/Heme/Allergies:  Negative for polydipsia.  Psychiatric/Behavioral:  Negative for depression and substance abuse. The patient is nervous/anxious.       Objective:     BP 121/73 (BP Location: Left Arm, Patient Position: Sitting, Cuff Size: Normal)   Pulse 80   Temp 98.3 F (36.8 C) (Temporal)   Ht 5\' 7"  (1.702 m)   Wt 213 lb (96.6 kg)   SpO2 93%   BMI 33.36 kg/m    Physical Exam Constitutional:      General: She is not in acute distress.    Appearance: Normal appearance. She is not ill-appearing, toxic-appearing or diaphoretic.  HENT:     Head: Normocephalic and atraumatic.     Right Ear: External ear normal.     Left Ear: External ear normal.     Mouth/Throat:     Mouth: Mucous membranes  are moist.     Pharynx: Oropharynx is clear. No oropharyngeal exudate or posterior oropharyngeal erythema.  Eyes:     General: No scleral icterus.       Right eye: No discharge.        Left eye: No discharge.     Extraocular Movements: Extraocular movements intact.     Conjunctiva/sclera: Conjunctivae normal.     Pupils: Pupils are equal, round, and reactive to light.  Cardiovascular:     Rate and Rhythm: Normal rate and regular rhythm.  Pulmonary:     Effort: Pulmonary effort is normal. No respiratory distress.     Breath sounds: Rhonchi (faint crackles at bases) present.  Abdominal:     General: Bowel sounds are normal.     Tenderness: There is no abdominal tenderness. There is no guarding.  Musculoskeletal:     Cervical back: No rigidity or tenderness.     Right lower leg: Edema (trace to 1 plus) present.     Left lower leg: Edema (trace to 1 plus.) present.  Skin:    General: Skin is warm and dry.  Neurological:     Mental Status: She is alert and oriented to person, place, and time.  Psychiatric:        Mood and Affect: Mood normal.  Behavior: Behavior normal.      No results found for any visits on 01/01/22.    The ASCVD Risk score (Arnett DK, et al., 2019) failed to calculate for the following reasons:   The 2019 ASCVD risk score is only valid for ages 64 to 17    Assessment & Plan:   Problem List Items Addressed This Visit       Musculoskeletal and Integument   LSC (lichen simplex chronicus)   Relevant Medications   triamcinolone ointment (KENALOG) 0.5 %     Other   Hyponatremia - Primary (Chronic)   Relevant Orders   Basic metabolic panel   Anxiety   Relevant Medications   ALPRAZolam (XANAX) 0.25 MG tablet   Other Visit Diagnoses     Need for influenza vaccination       Relevant Orders   Flu vaccine HIGH DOSE PF (Fluzone High dose)       Return in about 6 months (around 07/02/2022), or if symptoms worsen or fail to improve.   Hyponatremia most likely secondary to aggressive diuresis.  She is increased her salt load.  Will forward BMP to cardiology.  Mliss Sax, MD

## 2022-01-02 LAB — BASIC METABOLIC PANEL
BUN: 25 mg/dL — ABNORMAL HIGH (ref 6–23)
CO2: 34 mEq/L — ABNORMAL HIGH (ref 19–32)
Calcium: 9.2 mg/dL (ref 8.4–10.5)
Chloride: 86 mEq/L — ABNORMAL LOW (ref 96–112)
Creatinine, Ser: 0.94 mg/dL (ref 0.40–1.20)
GFR: 54.9 mL/min — ABNORMAL LOW (ref >60.00)
Glucose, Bld: 98 mg/dL (ref 70–99)
Potassium: 4 mEq/L (ref 3.5–5.1)
Sodium: 126 mEq/L — ABNORMAL LOW (ref 135–145)

## 2022-01-03 ENCOUNTER — Other Ambulatory Visit: Payer: Self-pay | Admitting: Cardiology

## 2022-01-11 ENCOUNTER — Other Ambulatory Visit: Payer: Self-pay | Admitting: Family Medicine

## 2022-01-11 DIAGNOSIS — L304 Erythema intertrigo: Secondary | ICD-10-CM

## 2022-01-22 ENCOUNTER — Telehealth: Payer: Self-pay | Admitting: Family Medicine

## 2022-01-22 NOTE — Telephone Encounter (Signed)
Caller Name: pt Call back phone #: [phone]    MEDICATION(S):  ALPRAZolam (XANAX) 0.25 MG tablet [[id number]]   Days of Med Remaining: she's out, she would like enough for a month at a time. 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   Has the patient contacted their pharmacy (YES/NO)? Yes contact your PCP   Preferred Pharmacy:  [facility]  [address], [place], [place] [address] [phone]

## 2022-01-23 ENCOUNTER — Other Ambulatory Visit: Payer: Self-pay

## 2022-01-23 ENCOUNTER — Telehealth: Payer: Self-pay | Admitting: Family Medicine

## 2022-01-23 DIAGNOSIS — F419 Anxiety disorder, unspecified: Secondary | ICD-10-CM

## 2022-01-23 MED ORDER — ALPRAZOLAM 0.25 MG PO TABS: 0.2500 mg | ORAL_TABLET | Freq: Two times a day (BID) | ORAL | 0 refills | Status: DC | PRN

## 2022-01-23 NOTE — Telephone Encounter (Signed)
Please call pt he stated he having issue getting her medication refilled

## 2022-01-23 NOTE — Addendum Note (Signed)
Addended by: Andrez Grime on: 01/23/2022 04:53 PM   Modules accepted: Orders

## 2022-01-23 NOTE — Progress Notes (Signed)
error 

## 2022-01-23 NOTE — Telephone Encounter (Signed)
Caller Name: pt Call back phone #: [phone]   MEDICATION(S):  ALPRAZolam (XANAX) 0.25 MG tablet [[id number]]   Days of Med Remaining:   Has the patient contacted their pharmacy (YES/NO)? no What did pharmacy advise?   Preferred Pharmacy:  Preferred Pharmacy:  [facility]  [address], [place], [place] [address] [phone]     ~~~Please advise patient/caregiver to allow 2-3 business days to process RX refills.

## 2022-01-23 NOTE — Telephone Encounter (Signed)
[phone] Kaitlyn Rodriguez . Pt is upset about his mother medication being refilled

## 2022-02-06 ENCOUNTER — Other Ambulatory Visit: Payer: Self-pay | Admitting: Family Medicine

## 2022-02-06 DIAGNOSIS — E611 Iron deficiency: Secondary | ICD-10-CM

## 2022-02-15 ENCOUNTER — Ambulatory Visit: Payer: Medicare Other | Admitting: Cardiology

## 2022-02-28 ENCOUNTER — Other Ambulatory Visit: Payer: Self-pay | Admitting: Family Medicine

## 2022-02-28 DIAGNOSIS — F419 Anxiety disorder, unspecified: Secondary | ICD-10-CM

## 2022-02-28 DIAGNOSIS — L304 Erythema intertrigo: Secondary | ICD-10-CM

## 2022-02-28 NOTE — Telephone Encounter (Signed)
Refill request for  Alprazolam 0.25 mg LR 01/23/22, #60, 0 rf  Nystop powder   LR  01/12/22, #15 grams, 0 rf LOV 01/01/22 FOV  none scheduled.   Please review and advise.  Thanks. Dm/cma

## 2022-03-12 ENCOUNTER — Other Ambulatory Visit: Payer: Self-pay | Admitting: Cardiology

## 2022-03-12 ENCOUNTER — Telehealth: Payer: Self-pay | Admitting: Family Medicine

## 2022-03-12 DIAGNOSIS — E039 Hypothyroidism, unspecified: Secondary | ICD-10-CM

## 2022-03-12 MED ORDER — SYNTHROID 125 MCG PO TABS: 125.0000 ug | ORAL_TABLET | Freq: Every morning | ORAL | 0 refills | Status: DC

## 2022-03-12 NOTE — Telephone Encounter (Signed)
Caller Name: Konrad Dolores  Call back phone #: [phone]    MEDICATION(S):  SYNTHROID 125 MCG tablet [[id number]]   Days of Med Remaining: a few pills left   Has the patient contacted their pharmacy (YES/NO)? Yes contact pcp   Preferred Pharmacy: [facility] [address] [place] - [place], [place] [address] Phone: [phone]  Fax: [phone] DEA #: [id number]

## 2022-03-17 ENCOUNTER — Other Ambulatory Visit: Payer: Self-pay | Admitting: Cardiology

## 2022-03-21 ENCOUNTER — Other Ambulatory Visit: Payer: Self-pay | Admitting: Cardiology

## 2022-03-29 ENCOUNTER — Telehealth: Payer: Self-pay | Admitting: Family Medicine

## 2022-03-29 NOTE — Telephone Encounter (Signed)
Left message for patient to call back and schedule Medicare Annual Wellness Visit (AWV) either virtually or in office. Left  my jabber number [phone]   AWVI DUE 02/26/2009 PER PALMETTO please schedule with Nurse Health Adviser   45 min for awv-i  in office appointments 30 min for awv-s & awv-i phone/virtual appointments  

## 2022-04-04 ENCOUNTER — Other Ambulatory Visit: Payer: Self-pay | Admitting: Family Medicine

## 2022-04-04 DIAGNOSIS — F419 Anxiety disorder, unspecified: Secondary | ICD-10-CM

## 2022-04-16 ENCOUNTER — Ambulatory Visit (INDEPENDENT_AMBULATORY_CARE_PROVIDER_SITE_OTHER): Payer: Medicare Other

## 2022-04-16 VITALS — Ht 67.0 in | Wt 218.0 lb

## 2022-04-16 DIAGNOSIS — Z0000 Encounter for general adult medical examination without abnormal findings: Secondary | ICD-10-CM | POA: Diagnosis not present

## 2022-04-16 NOTE — Progress Notes (Signed)
I connected with  Kaitlyn Rodriguez on 04/16/22 by a audio enabled telemedicine application and verified that I am speaking with the correct person using two identifiers. Son Kaitlyn Rodriguez was also on the call.  Patient Location: Home  Provider Location: Office/Clinic  I discussed the limitations of evaluation and management by telemedicine. The patient expressed understanding and agreed to proceed.  Subjective:   Kaitlyn Rodriguez is a 89 y.o. female who presents for an Initial Medicare Annual Wellness Visit.  Review of Systems     Cardiac Risk Factors include: advanced age (>36mn, >>73women);obesity (BMI >30kg/m2)     Objective:    Today's Vitals   04/16/22 1441  Weight: 218 lb (98.9 kg)  Height: '5\' 7"'$  (1.702 m)   Body mass index is 34.14 kg/m.     04/16/2022    2:50 PM 12/06/2020    1:06 PM 12/23/2018    8:54 PM 12/20/2018    5:17 PM  Advanced Directives  Does Patient Have a Medical Advance Directive? Yes No No No  Type of [place] will     Copy of [place]? No - copy requested     Would patient like information on creating a medical advance directive?  No - Patient declined Yes (Inpatient - patient requests chaplain consult to create a medical advance directive)     Current Medications (verified) Outpatient Encounter Medications as of 04/16/2022  Medication Sig   ALPRAZolam (XANAX) 0.25 MG tablet TAKE 1 TABLET BY MOUTH TWICE A DAY AS NEEDED FOR ANXIETY   apixaban (ELIQUIS) 5 MG TABS tablet Take 1 tablet by mouth twice daily   cyanocobalamin (,VITAMIN B-12,) 1000 MCG/ML injection Inject 1 mL (1,000 mcg total) into the muscle every 30 (thirty) days.   FEROSUL 325 (65 Fe) MG tablet TAKE 1 TABLET BY MOUTH EVERY OTHER DAY; NEEDS APPOINTMENT FOR MORE REFILLS   furosemide (LASIX) 40 MG tablet TAKE 1 TABLET BY MOUTH DAILY IN THE MORNING AND 1 EVERY OTHER NIGHT   losartan (COZAAR) 25 MG tablet Take 1 tablet (25 mg  total) by mouth daily.   metolazone (ZAROXOLYN) 5 MG tablet Take 1 tablet (5 mg total) by mouth once a week. Take 30 minutes before first Lasix dose   metoprolol succinate (TOPROL-XL) 50 MG 24 hr tablet TAKE 1 TABLET BY MOUTH IN THE MORNING AND AT BEDTIME, TAKE WITH OR IMMEDIATELY FOLLOWING A MEAL   NYSTATIN powder APPLY 1 POWDER TOPICALLY TWICE DAILY FOR 2 WEEKS   SYNTHROID 125 MCG tablet Take 1 tablet (125 mcg total) by mouth every morning.   triamcinolone ointment (KENALOG) 0.5 % Apply 1 Application topically 2 (two) times daily.   Multiple Vitamin (MULTIVITAMIN WITH MINERALS) TABS tablet Take 1 tablet by mouth daily. (Patient not taking: Reported on 04/16/2022)   No facility-administered encounter medications on file as of 04/16/2022.    Allergies (verified) Celexa [citalopram]   History: Past Medical History:  Diagnosis Date   Anxiety disorder, unspecified    Benign essential hypertension    Dissection of unspecified site of aorta ([place] 11/[place]   Heart failure, unspecified ([place]    History of aortic valve replacement    Hypothyroidism, unspecified    Unspecified osteoarthritis, unspecified site    Vitamin B12 deficiency anemia    Past Surgical History:  Procedure Laterality Date   ABDOMINAL HYSTERECTOMY     at age 89  REPLACEMENT ASCENDING AORTA N/A 12/20/2018   Procedure: REPAIR OF  TYPE A AORTIC DISSECTION USING HEMASHIELD PLATINUM 28MM AND 32MM GRAFT;  Surgeon: Wonda Olds, MD;  Location: [facility];  Service: Open Heart Surgery;  Laterality: N/A;   STERNAL CLOSURE  12/20/2018   Procedure: STERNAL PLATING;  Surgeon: Wonda Olds, MD;  Location: [facility] OR;  Service: Open Heart Surgery;;   Family History  Problem Relation Age of Onset   Hypertension Father    Social History   Socioeconomic History   Marital status: Widowed    Spouse name: Not on file   Number of children: Not on file   Years of education: Not on file   Highest education level: Not on file   Occupational History   Not on file  Tobacco Use   Smoking status: Never   Smokeless tobacco: Never  Vaping Use   Vaping Use: Never used  Substance and Sexual Activity   Alcohol use: Never   Drug use: Never   Sexual activity: Not Currently  Other Topics Concern   Not on file  Social History Narrative   Not on file   Social Determinants of Health   Financial Resource Strain: Low Risk  (04/16/2022)   Overall Financial Resource Strain (CARDIA)    Difficulty of Paying Living Expenses: Not hard at all  Food Insecurity: No Food Insecurity (04/16/2022)   Hunger Vital Sign    Worried About Running Out of Food in the Last Year: Never true    [place] in the Last Year: Never true  Transportation Needs: No Transportation Needs (04/16/2022)   PRAPARE - Hydrologist (Medical): No    Lack of Transportation (Non-Medical): No  Physical Activity: Sufficiently Active (04/16/2022)   Exercise Vital Sign    Days of Exercise per Week: 7 days    Minutes of Exercise per Session: 30 min  Stress: No Stress Concern Present (04/16/2022)   [place]    Feeling of Stress : Not at all  Social Connections: Not on file    Tobacco Counseling Counseling given: Not Answered   Clinical Intake:  Pre-visit preparation completed: Yes  Pain : No/denies pain     Nutritional Status: BMI > 30  Obese Nutritional Risks: None Diabetes: No  How often do you need to have someone help you when you read instructions, pamphlets, or other written materials from your doctor or pharmacy?: 1 - Never  Diabetic? no  Interpreter Needed?: No  Information entered by :: NAllen LPN   Activities of Daily Living    04/16/2022    2:51 PM  In your present state of health, do you have any difficulty performing the following activities:  Hearing? 0  Vision? 0  Difficulty concentrating or making decisions? 0  Walking  or climbing stairs? 1  Dressing or bathing? 0  Doing errands, shopping? 1  Preparing Food and eating ? N  Using the Toilet? N  In the past six months, have you accidently leaked urine? Y  Do you have problems with loss of bowel control? N  Managing your Medications? Y  Comment son manages  Managing your Finances? N  Housekeeping or managing your Housekeeping? N    Patient Care Team: Libby Maw, MD as PCP - General (Family Medicine) Berniece Salines, DO as PCP - Cardiology (Cardiology)  Indicate any recent Medical Services you may have received from other than [facility] providers in the past year (date may be approximate).  Assessment:   This is a routine wellness examination for Saryn.  Hearing/Vision screen Vision Screening - Comments:: No regular eye exams  Dietary issues and exercise activities discussed: Current Exercise Habits: Home exercise routine, Type of exercise: walking, Time (Minutes): 20, Frequency (Times/Week): 7, Weekly Exercise (Minutes/Week): 140   Goals Addressed             This Visit's Progress    Patient Stated       04/16/2022, wants to lose weight       Depression Screen    04/16/2022    2:51 PM 01/01/2022    2:31 PM 08/14/2021    3:39 PM 07/03/2021    4:54 PM 07/03/2021    4:25 PM 01/16/2021    4:18 PM 01/16/2021    3:11 PM  PHQ 2/9 Scores  PHQ - 2 Score 0 0 '4 2 2 '$ 0 0  PHQ- 9 Score    5  1     Fall Risk    04/16/2022    2:50 PM 08/14/2021    3:39 PM 07/03/2021    4:25 PM 01/16/2021    3:12 PM  [place] in the past year? 0 0 0 0  Number falls in past yr: 0 1 0 0  Injury with Fall? 0     Risk for fall due to : Medication side effect     Follow up Falls prevention discussed;Education provided;Falls evaluation completed       FALL RISK PREVENTION PERTAINING TO THE HOME:  Any stairs in or around the home? Yes  If so, are there any without handrails? No  Home free of loose throw rugs in walkways, pet beds, electrical  cords, etc? Yes  Adequate lighting in your home to reduce risk of falls? Yes   ASSISTIVE DEVICES UTILIZED TO PREVENT FALLS:  Life alert? No  Use of a cane, walker or w/c? Yes  Grab bars in the bathroom? Yes  Shower chair or bench in shower? No  Elevated toilet seat or a handicapped toilet? Yes   TIMED UP AND GO:  Was the test performed? No .       Cognitive Function:        04/16/2022    2:53 PM  6CIT Screen  What Year? 0 points  What month? 0 points  What time? 0 points  Count back from 20 0 points  Months in reverse 0 points  Repeat phrase 0 points  Total Score 0 points    Immunizations Immunization History  Administered Date(s) Administered   Influenza, High Dose Seasonal PF 01/01/2022    TDAP status: Due, Education has been provided regarding the importance of this vaccine. Advised may receive this vaccine at local pharmacy or Health Dept. Aware to provide a copy of the vaccination record if obtained from local pharmacy or Health Dept. Verbalized acceptance and understanding.  Flu Vaccine status: Up to date  Pneumococcal vaccine status: Declined,  Education has been provided regarding the importance of this vaccine but patient still declined. Advised may receive this vaccine at local pharmacy or Health Dept. Aware to provide a copy of the vaccination record if obtained from local pharmacy or Health Dept. Verbalized acceptance and understanding.   Covid-19 vaccine status: Declined, Education has been provided regarding the importance of this vaccine but patient still declined. Advised may receive this vaccine at local pharmacy or Health Dept.or vaccine clinic. Aware to provide a copy of the vaccination record if obtained from local  pharmacy or Health Dept. Verbalized acceptance and understanding.  Qualifies for Shingles Vaccine? Yes   Zostavax completed No   Shingrix Completed?: No.    Education has been provided regarding the importance of this vaccine. Patient  has been advised to call insurance company to determine out of pocket expense if they have not yet received this vaccine. Advised may also receive vaccine at local pharmacy or Health Dept. Verbalized acceptance and understanding.  Screening Tests Health Maintenance  Topic Date Due   DTaP/Tdap/Td (1 - Tdap) Never done   Pneumonia Vaccine 76+ Years old (1 of 1 - PCV) Never done   DEXA SCAN  Never done   HPV VACCINES  Aged Out   INFLUENZA VACCINE  Discontinued   COVID-19 Vaccine  Discontinued   Zoster Vaccines- Shingrix  Discontinued    Health Maintenance  Health Maintenance Due  Topic Date Due   DTaP/Tdap/Td (1 - Tdap) Never done   Pneumonia Vaccine 17+ Years old (1 of 1 - PCV) Never done   DEXA SCAN  Never done    Colorectal cancer screening: No longer required.   Mammogram status: No longer required due to age.  Bone Density status: due  Lung Cancer Screening: (Low Dose CT Chest recommended if Age 47-80 years, 30 pack-year currently smoking OR have quit w/in 15years.) does not qualify.   Lung Cancer Screening Referral: no  Additional Screening:  Hepatitis C Screening: does not qualify;   Vision Screening: Recommended annual ophthalmology exams for early detection of glaucoma and other disorders of the eye. Is the patient up to date with their annual eye exam?  No  Who is the provider or what is the name of the office in which the patient attends annual eye exams? none If pt is not established with a provider, would they like to be referred to a provider to establish care? No .   Dental Screening: Recommended annual dental exams for proper oral hygiene  Community Resource Referral / Chronic Care Management: CRR required this visit?  No   CCM required this visit?  No      Plan:     I have personally reviewed and noted the following in the patient's chart:   Medical and social history Use of alcohol, tobacco or illicit drugs  Current medications and supplements  including opioid prescriptions. Patient is not currently taking opioid prescriptions. Functional ability and status Nutritional status Physical activity Advanced directives List of other physicians Hospitalizations, surgeries, and ER visits in previous 12 months Vitals Screenings to include cognitive, depression, and falls Referrals and appointments  In addition, I have reviewed and discussed with patient certain preventive protocols, quality metrics, and best practice recommendations. A written personalized care plan for preventive services as well as general preventive health recommendations were provided to patient.     Kellie Simmering, LPN   [id number]   Nurse Notes: none  Due to this being a virtual visit, the after visit summary with patients personalized plan was offered to patient via mail or my-chart.  to pick up at office at next visit

## 2022-04-16 NOTE — Patient Instructions (Signed)
Kaitlyn Rodriguez , Thank you for taking time to come for your Medicare Wellness Visit. I appreciate your ongoing commitment to your health goals. Please review the following plan we discussed and let me know if I can assist you in the future.   These are the goals we discussed:  Goals      Patient Stated     04/16/2022, wants to lose weight        This is a list of the screening recommended for you and due dates:  Health Maintenance  Topic Date Due   DTaP/Tdap/Td vaccine (1 - Tdap) Never done   Pneumonia Vaccine (1 of 1 - PCV) Never done   DEXA scan (bone density measurement)  Never done   HPV Vaccine  Aged Out   Flu Shot  Discontinued   COVID-19 Vaccine  Discontinued   Zoster (Shingles) Vaccine  Discontinued    Advanced directives: Please bring a copy of your POA (Power of [place]) and/or Living Will to your next appointment.   Conditions/risks identified: none  Next appointment: Follow up in one year for your annual wellness visit    Preventive Care 65 Years and Older, Female Preventive care refers to lifestyle choices and visits with your health care provider that can promote health and wellness. What does preventive care include? A yearly physical exam. This is also called an annual well check. Dental exams once or twice a year. Routine eye exams. Ask your health care provider how often you should have your eyes checked. Personal lifestyle choices, including: Daily care of your teeth and gums. Regular physical activity. Eating a healthy diet. Avoiding tobacco and drug use. Limiting alcohol use. Practicing safe sex. Taking low-dose aspirin every day. Taking vitamin and mineral supplements as recommended by your health care provider. What happens during an annual well check? The services and screenings done by your health care provider during your annual well check will depend on your age, overall health, lifestyle risk factors, and family history of disease. Counseling   Your health care provider may ask you questions about your: Alcohol use. Tobacco use. Drug use. Emotional well-being. Home and relationship well-being. Sexual activity. Eating habits. History of falls. Memory and ability to understand (cognition). Work and work Statistician. Reproductive health. Screening  You may have the following tests or measurements: Height, weight, and BMI. Blood pressure. Lipid and cholesterol levels. These may be checked every 5 years, or more frequently if you are over 30 years old. Skin check. Lung cancer screening. You may have this screening every year starting at age 52 if you have a 30-pack-year history of smoking and currently smoke or have quit within the past 15 years. Fecal occult blood test (FOBT) of the stool. You may have this test every year starting at age 38. Flexible sigmoidoscopy or colonoscopy. You may have a sigmoidoscopy every 5 years or a colonoscopy every 10 years starting at age 14. Hepatitis C blood test. Hepatitis B blood test. Sexually transmitted disease (STD) testing. Diabetes screening. This is done by checking your blood sugar (glucose) after you have not eaten for a while (fasting). You may have this done every 1-3 years. Bone density scan. This is done to screen for osteoporosis. You may have this done starting at age 39. Mammogram. This may be done every 1-2 years. Talk to your health care provider about how often you should have regular mammograms. Talk with your health care provider about your test results, treatment options, and if necessary, the need  for more tests. Vaccines  Your health care provider may recommend certain vaccines, such as: Influenza vaccine. This is recommended every year. Tetanus, diphtheria, and acellular pertussis (Tdap, Td) vaccine. You may need a Td booster every 10 years. Zoster vaccine. You may need this after age 42. Pneumococcal 13-valent conjugate (PCV13) vaccine. One dose is recommended  after age 50. Pneumococcal polysaccharide (PPSV23) vaccine. One dose is recommended after age 22. Talk to your health care provider about which screenings and vaccines you need and how often you need them. This information is not intended to replace advice given to you by your health care provider. Make sure you discuss any questions you have with your health care provider. Document Released: 03/11/2015 Document Revised: 11/02/2015 Document Reviewed: 12/14/2014 Elsevier Interactive Patient Education  2017 [place] Prevention in the Home Falls can cause injuries. They can happen to people of all ages. There are many things you can do to make your home safe and to help prevent falls. What can I do on the outside of my home? Regularly fix the edges of walkways and driveways and fix any cracks. Remove anything that might make you trip as you walk through a door, such as a raised step or threshold. Trim any bushes or trees on the path to your home. Use bright outdoor lighting. Clear any walking paths of anything that might make someone trip, such as rocks or tools. Regularly check to see if handrails are loose or broken. Make sure that both sides of any steps have handrails. Any raised decks and porches should have guardrails on the edges. Have any leaves, snow, or ice cleared regularly. Use sand or salt on walking paths during winter. Clean up any spills in your garage right away. This includes oil or grease spills. What can I do in the bathroom? Use night lights. Install grab bars by the toilet and in the tub and shower. Do not use towel bars as grab bars. Use non-skid mats or decals in the tub or shower. If you need to sit down in the shower, use a plastic, non-slip stool. Keep the floor dry. Clean up any water that spills on the floor as soon as it happens. Remove soap buildup in the tub or shower regularly. Attach bath mats securely with double-sided non-slip rug tape. Do not  have throw rugs and other things on the floor that can make you trip. What can I do in the bedroom? Use night lights. Make sure that you have a light by your bed that is easy to reach. Do not use any sheets or blankets that are too big for your bed. They should not hang down onto the floor. Have a firm chair that has side arms. You can use this for support while you get dressed. Do not have throw rugs and other things on the floor that can make you trip. What can I do in the kitchen? Clean up any spills right away. Avoid walking on wet floors. Keep items that you use a lot in easy-to-reach places. If you need to reach something above you, use a strong step stool that has a grab bar. Keep electrical cords out of the way. Do not use floor polish or wax that makes floors slippery. If you must use wax, use non-skid floor wax. Do not have throw rugs and other things on the floor that can make you trip. What can I do with my stairs? Do not leave any items on the stairs.  Make sure that there are handrails on both sides of the stairs and use them. Fix handrails that are broken or loose. Make sure that handrails are as long as the stairways. Check any carpeting to make sure that it is firmly attached to the stairs. Fix any carpet that is loose or worn. Avoid having throw rugs at the top or bottom of the stairs. If you do have throw rugs, attach them to the floor with carpet tape. Make sure that you have a light switch at the top of the stairs and the bottom of the stairs. If you do not have them, ask someone to add them for you. What else can I do to help prevent falls? Wear shoes that: Do not have high heels. Have rubber bottoms. Are comfortable and fit you well. Are closed at the toe. Do not wear sandals. If you use a stepladder: Make sure that it is fully opened. Do not climb a closed stepladder. Make sure that both sides of the stepladder are locked into place. Ask someone to hold it for  you, if possible. Clearly mark and make sure that you can see: Any grab bars or handrails. First and last steps. Where the edge of each step is. Use tools that help you move around (mobility aids) if they are needed. These include: Canes. Walkers. Scooters. Crutches. Turn on the lights when you go into a dark area. Replace any light bulbs as soon as they burn out. Set up your furniture so you have a clear path. Avoid moving your furniture around. If any of your floors are uneven, fix them. If there are any pets around you, be aware of where they are. Review your medicines with your doctor. Some medicines can make you feel dizzy. This can increase your chance of falling. Ask your doctor what other things that you can do to help prevent falls. This information is not intended to replace advice given to you by your health care provider. Make sure you discuss any questions you have with your health care provider. Document Released: 12/09/2008 Document Revised: 07/21/2015 Document Reviewed: 03/19/2014 Elsevier Interactive Patient Education  2017 [employer].

## 2022-04-17 ENCOUNTER — Ambulatory Visit: Payer: Medicare Other | Attending: Cardiology | Admitting: Cardiology

## 2022-04-17 VITALS — BP 130/88 | HR 74 | Ht 66.0 in | Wt 218.0 lb

## 2022-04-17 DIAGNOSIS — I10 Essential (primary) hypertension: Secondary | ICD-10-CM | POA: Diagnosis not present

## 2022-04-17 DIAGNOSIS — I5042 Chronic combined systolic (congestive) and diastolic (congestive) heart failure: Secondary | ICD-10-CM

## 2022-04-17 DIAGNOSIS — I4819 Other persistent atrial fibrillation: Secondary | ICD-10-CM

## 2022-04-17 DIAGNOSIS — Z79899 Other long term (current) drug therapy: Secondary | ICD-10-CM

## 2022-04-17 DIAGNOSIS — D6869 Other thrombophilia: Secondary | ICD-10-CM | POA: Diagnosis not present

## 2022-04-17 DIAGNOSIS — I340 Nonrheumatic mitral (valve) insufficiency: Secondary | ICD-10-CM

## 2022-04-17 MED ORDER — TORSEMIDE 20 MG PO TABS: ORAL_TABLET | ORAL | 3 refills | Status: DC

## 2022-04-17 NOTE — Patient Instructions (Signed)
Medication Instructions:  Your physician has recommended you make the following change in your medication: STOP: Lasix START: Torsemide 20 mg daily and every other night *If you need a refill on your cardiac medications before your next appointment, please call your pharmacy*   Lab Work: Your physician recommends that you have labs drawn: CMET, Mag, CBC If you have labs (blood work) drawn today and your tests are completely normal, you will receive your results only by: [place] (if you have MyChart) OR A paper copy in the mail If you have any lab test that is abnormal or we need to change your treatment, we will call you to review the results.   Testing/Procedures: None   Follow-Up: At [facility], you and your health needs are our priority.  As part of our continuing mission to provide you with exceptional heart care, we have created designated Provider Care Teams.  These Care Teams include your primary Cardiologist (physician) and Advanced Practice Providers (APPs -  Physician Assistants and Nurse Practitioners) who all work together to provide you with the care you need, when you need it.   Your next appointment:   12 week(s)  Provider:   Berniece Salines, DO

## 2022-04-18 LAB — CBC WITH DIFFERENTIAL/PLATELET
Basophils Absolute: 0 10*3/uL (ref 0.0–0.2)
Basos: 0 %
EOS (ABSOLUTE): 0.1 10*3/uL (ref 0.0–0.4)
Eos: 1 %
Hematocrit: 42.9 % (ref 34.0–46.6)
Hemoglobin: 14.5 g/dL (ref 11.1–15.9)
Immature Grans (Abs): 0 10*3/uL (ref 0.0–0.1)
Immature Granulocytes: 0 %
Lymphocytes Absolute: 0.7 10*3/uL (ref 0.7–3.1)
Lymphs: 9 %
MCH: 32.4 pg (ref 26.6–33.0)
MCHC: 33.8 g/dL (ref 31.5–35.7)
MCV: 96 fL (ref 79–97)
Monocytes Absolute: 0.8 10*3/uL (ref 0.1–0.9)
Monocytes: 11 %
Neutrophils Absolute: 5.9 10*3/uL (ref 1.4–7.0)
Neutrophils: 79 %
Platelets: 202 10*3/uL (ref 150–450)
RBC: 4.47 x10E6/uL (ref 3.77–5.28)
RDW: 12 % (ref 11.7–15.4)
WBC: 7.5 10*3/uL (ref 3.4–10.8)

## 2022-04-18 LAB — COMPREHENSIVE METABOLIC PANEL
ALT: 8 IU/L (ref 0–32)
AST: 17 IU/L (ref 0–40)
Albumin/Globulin Ratio: 1.4 (ref 1.2–2.2)
Albumin: 3.9 g/dL (ref 3.7–4.7)
Alkaline Phosphatase: 74 IU/L (ref 44–121)
BUN/Creatinine Ratio: 20 (ref 12–28)
BUN: 17 mg/dL (ref 8–27)
Bilirubin Total: 0.5 mg/dL (ref 0.0–1.2)
CO2: 30 mmol/L — ABNORMAL HIGH (ref 20–29)
Calcium: 9.5 mg/dL (ref 8.7–10.3)
Chloride: 83 mmol/L — ABNORMAL LOW (ref 96–106)
Creatinine, Ser: 0.85 mg/dL (ref 0.57–1.00)
Globulin, Total: 2.7 g/dL (ref 1.5–4.5)
Glucose: 98 mg/dL (ref 70–99)
Potassium: 4.1 mmol/L (ref 3.5–5.2)
Sodium: 126 mmol/L — ABNORMAL LOW (ref 134–144)
Total Protein: 6.6 g/dL (ref 6.0–8.5)
eGFR: 67 mL/min/{1.73_m2} (ref >59)

## 2022-04-18 LAB — MAGNESIUM: Magnesium: 1.6 mg/dL (ref 1.6–2.3)

## 2022-04-18 NOTE — Progress Notes (Signed)
Cardiology Office Note:    Date:  04/18/2022   ID:  [name], DOB 09/29/1933, MRN [id number]  PCP:  Kaitlyn Maw, MD  Cardiologist:  Kaitlyn Salines, DO  Electrophysiologist:  None   Referring MD: Kaitlyn Rodriguez,*   " I am doing well"  History of Present Illness:    Kaitlyn Rodriguez is a 89 y.o. female with a hx of  repair of type a dissection in 2020, permanent atrial fibrillation on anticoagulation with Eliquis 5 mg twice daily, recently diagnosed depressed ejection fraction, history of CVA, presents today for follow-up visit.  I saw the patient on 11/02/2021 at that time she had significant fluid overload.  I increased her Lasix to twice a day for 5 days and added metolazone 5 mg x 3 days.  At her lat visit in 10/2021 she was doing well - had good response to the diuretics. She did for a while but now is not quite responding.   She is here today with her son for a follow up visit.      Past Medical History:  Diagnosis Date   Anxiety disorder, unspecified    Benign essential hypertension    Dissection of unspecified site of aorta (Kaitlyn Rodriguez) 11/2018   Heart failure, unspecified ([place])    History of aortic valve replacement    Hypothyroidism, unspecified    Unspecified osteoarthritis, unspecified site    Vitamin B12 deficiency anemia     Past Surgical History:  Procedure Laterality Date   ABDOMINAL HYSTERECTOMY     at age 68   REPLACEMENT ASCENDING AORTA N/A 12/20/2018   Procedure: REPAIR OF TYPE A AORTIC DISSECTION USING HEMASHIELD PLATINUM 28MM AND 32MM GRAFT;  Surgeon: Kaitlyn Olds, MD;  Location: [facility];  Service: Open Heart Surgery;  Laterality: N/A;   STERNAL CLOSURE  12/20/2018   Procedure: STERNAL PLATING;  Surgeon: Kaitlyn Olds, MD;  Location: [facility];  Service: Open Heart Surgery;;    Current Medications: Current Meds  Medication Sig   torsemide (DEMADEX) 20 MG tablet Take 20 mg (1 tablet) daily and take 20 mg (1 tablet) every other night.      Allergies:   Celexa [citalopram]   Social History   Socioeconomic History   Marital status: Widowed    Spouse name: Not on file   Number of children: Not on file   Years of education: Not on file   Highest education level: Not on file  Occupational History   Not on file  Tobacco Use   Smoking status: Never   Smokeless tobacco: Never  Vaping Use   Vaping Use: Never used  Substance and Sexual Activity   Alcohol use: Never   Drug use: Never   Sexual activity: Not Currently  Other Topics Concern   Not on file  Social History Narrative   Not on file   Social Determinants of Health   Financial Resource Strain: Low Risk  (04/16/2022)   Overall Financial Resource Strain (CARDIA)    Difficulty of Paying Living Expenses: Not hard at all  Food Insecurity: No Food Insecurity (04/16/2022)   Hunger Vital Sign    Worried About Running Out of Food in the Last Year: Never true    [place] in the Last Year: Never true  Transportation Needs: No Transportation Needs (04/16/2022)   PRAPARE - Hydrologist (Medical): No    Lack of Transportation (Non-Medical): No  Physical Activity: Sufficiently Active (04/16/2022)  Exercise Vital Sign    Days of Exercise per Week: 7 days    Minutes of Exercise per Session: 30 min  Stress: No Stress Concern Present (04/16/2022)   [place]    Feeling of Stress : Not at all  Social Connections: Not on file     Family History: The patient's family history includes Hypertension in her father.  ROS:   Review of Systems  Constitution: Negative for decreased appetite, fever and weight gain.  HENT: Negative for congestion, ear discharge, hoarse voice and sore throat.   Eyes: Negative for discharge, redness, vision loss in right eye and visual halos.  Cardiovascular: Negative for chest pain, dyspnea on exertion, leg swelling, orthopnea and palpitations.   Respiratory: Negative for cough, hemoptysis, shortness of breath and snoring.   Endocrine: Negative for heat intolerance and polyphagia.  Hematologic/Lymphatic: Negative for bleeding problem. Does not bruise/bleed easily.  Skin: Negative for flushing, nail changes, rash and suspicious lesions.  Musculoskeletal: Negative for arthritis, joint pain, muscle cramps, myalgias, neck pain and stiffness.  Gastrointestinal: Negative for abdominal pain, bowel incontinence, diarrhea and excessive appetite.  Genitourinary: Negative for decreased libido, genital sores and incomplete emptying.  Neurological: Negative for brief paralysis, focal weakness, headaches and loss of balance.  Psychiatric/Behavioral: Negative for altered mental status, depression and suicidal ideas.  Allergic/Immunologic: Negative for HIV exposure and persistent infections.    EKGs/Labs/Other Studies Reviewed:    The following studies were reviewed today:   EKG: None today  Echo 12/04/2020 IMPRESSIONS   1. Left ventricular ejection fraction, by estimation, is 35 to 40%. The left ventricle has moderately decreased function. The left ventricle has  no regional wall motion abnormalities. Left ventricular diastolic parameters are indeterminate.   2. Right ventricular systolic function is moderately reduced. The right ventricular size is mildly enlarged. There is mildly elevated pulmonary artery systolic pressure. The estimated right ventricular systolic pressure is [id number] mmHg.   3. Left atrial size was severely dilated.   4. Right atrial size was severely dilated.   5. The mitral valve is normal in structure. Severe mitral valve  regurgitation. No evidence of mitral stenosis.   6. Tricuspid valve regurgitation is moderate.   7. The aortic valve is calcified. Aortic valve regurgitation is mild.  Mild to moderate aortic valve sclerosis/calcification is present, without  any evidence of aortic stenosis.   8. Pulmonic valve  regurgitation is moderate.   9. Aortic dilatation noted and root/ascending aorta has been  repaired/replaced. There is mild dilatation of the ascending aorta,  measuring 40 mm.  10. The inferior vena cava is normal in size with greater than 50%  respiratory variability, suggesting right atrial pressure of 3 mmHg.   FINDINGS   Left Ventricle: Left ventricular ejection fraction, by estimation, is 35  to 40%. The left ventricle has moderately decreased function. The left  ventricle has no regional wall motion abnormalities. The left ventricular  internal cavity size was normal in  size. There is no left ventricular hypertrophy. Left ventricular diastolic  parameters are indeterminate.      LV Wall Scoring:  The mid and distal inferior wall and mid inferolateral segment are  akinetic.   Right Ventricle: The right ventricular size is mildly enlarged. No  increase in right ventricular wall thickness. Right ventricular systolic  function is moderately reduced. There is mildly elevated pulmonary artery  systolic pressure. The tricuspid  regurgitant velocity is 2.32 m/s, and with an assumed right  atrial  pressure of 15 mmHg, the estimated right ventricular systolic pressure is  [id number] mmHg.   Left Atrium: Left atrial size was severely dilated.   Right Atrium: Right atrial size was severely dilated.   Pericardium: There is no evidence of pericardial effusion.   Mitral Valve: The mitral valve is normal in structure. Severe mitral valve  regurgitation, with posteriorly-directed jet. No evidence of mitral valve  stenosis.   Tricuspid Valve: The tricuspid valve is normal in structure. Tricuspid  valve regurgitation is moderate . No evidence of tricuspid stenosis.   Aortic Valve: The aortic valve is calcified. Aortic valve regurgitation is  mild. Mild to moderate aortic valve sclerosis/calcification is present,  without any evidence of aortic stenosis.   Pulmonic Valve: The pulmonic valve  was normal in structure. Pulmonic valve  regurgitation is moderate. No evidence of pulmonic stenosis.   Aorta: Aortic dilatation noted and the aortic root/ascending aorta has  been repaired/replaced. There is mild dilatation of the ascending aorta,  measuring 40 mm.   Venous: The inferior vena cava is normal in size with greater than 50%  respiratory variability, suggesting right atrial pressure of 3 mmHg.   IAS/Shunts: No atrial level shunt detected by color flow Doppler  Recent Labs: 07/03/2021: TSH 1.22 11/02/2021: NT-Pro BNP 3,040 04/17/2022: ALT 8; BUN 17; Creatinine, Ser 0.85; Hemoglobin 14.5; Magnesium 1.6; Platelets 202; Potassium 4.1; Sodium 126  Recent Lipid Panel    Component Value Date/Time   CHOL 154 12/04/2020 0422   TRIG 72 12/04/2020 0422   HDL 46 12/04/2020 0422   CHOLHDL 3.3 12/04/2020 0422   VLDL 14 12/04/2020 0422   LDLCALC 94 12/04/2020 0422    Physical Exam:    VS:  BP 130/88   Pulse 74   Ht 5' 6"$  (1.676 m)   Wt 98.9 kg   BMI 35.19 kg/m     Wt Readings from Last 3 Encounters:  04/17/22 98.9 kg  04/16/22 98.9 kg  01/01/22 96.6 kg     GEN: Well nourished, well developed in no acute distress HEENT: Normal NECK: No JVD; No carotid bruits LYMPHATICS: No lymphadenopathy CARDIAC: S1S2 noted,RRR, no murmurs, rubs, gallops RESPIRATORY:  Clear to auscultation without rales, wheezing or rhonchi  ABDOMEN: Soft, non-tender, non-distended, +bowel sounds, no guarding. EXTREMITIES: +1 bilaterally extremity edema, No cyanosis, no clubbing MUSCULOSKELETAL:  No deformity  SKIN: Warm and dry NEUROLOGIC:  Alert and oriented x 3, non-focal PSYCHIATRIC:  Normal affect, good insight  ASSESSMENT:    1. Medication management   2. Persistent atrial fibrillation ([place])   3. Chronic combined systolic and diastolic congestive heart failure ([place])   4. Benign essential hypertension   5. Nonrheumatic mitral valve regurgitation   6. Hypercoagulable state due to persistent  atrial fibrillation (H[facility])    PLAN:    Some leg edema - will stop lasix and start torsemide 31m daily and twice daily on alternate days. Will continue with metolazone once weekly.   Will get blood work today.   The patient is in agreement with the above plan. The patient eft the office in stable condition.  The patient will follow up in 3 months or sooner if needed.   Medication Adjustments/Labs and Tests Ordered: Current medicines are reviewed at length with the patient today.  Concerns regarding medicines are outlined above.  Orders Placed This Encounter  Procedures   Comprehensive Metabolic Panel (CMET)   Magnesium   CBC with Differential/Platelet   Meds ordered this encounter  Medications  torsemide (DEMADEX) 20 MG tablet    Sig: Take 20 mg (1 tablet) daily and take 20 mg (1 tablet) every other night.    Dispense:  180 tablet    Refill:  3    Patient Instructions  Medication Instructions:  Your physician has recommended you make the following change in your medication: STOP: Lasix START: Torsemide 20 mg daily and every other night *If you need a refill on your cardiac medications before your next appointment, please call your pharmacy*   Lab Work: Your physician recommends that you have labs drawn: CMET, Mag, CBC If you have labs (blood work) drawn today and your tests are completely normal, you will receive your results only by: [place] (if you have MyChart) OR A paper copy in the mail If you have any lab test that is abnormal or we need to change your treatment, we will call you to review the results.   Testing/Procedures: None   Follow-Up: At [facility], you and your health needs are our priority.  As part of our continuing mission to provide you with exceptional heart care, we have created designated Provider Care Teams.  These Care Teams include your primary Cardiologist (physician) and Advanced Practice Providers (APPs -  Physician  Assistants and Nurse Practitioners) who all work together to provide you with the care you need, when you need it.   Your next appointment:   12 week(s)  Provider:   Berniece Salines, DO       Adopting a Healthy Lifestyle.  Know what a healthy weight is for you (roughly BMI <25) and aim to maintain this   Aim for 7+ servings of fruits and vegetables daily   65-80+ fluid ounces of water or unsweet tea for healthy kidneys   Limit to max 1 drink of alcohol per day; avoid smoking/tobacco   Limit animal fats in diet for cholesterol and heart health - choose grass fed whenever available   Avoid highly processed foods, and foods high in saturated/trans fats   Aim for low stress - take time to unwind and care for your mental health   Aim for 150 min of moderate intensity exercise weekly for heart health, and weights twice weekly for bone health   Aim for 7-9 hours of sleep daily   When it comes to diets, agreement about the perfect plan isnt easy to find, even among the experts. Experts at the [employer] developed an idea known as the Healthy Eating Plate. Just imagine a plate divided into logical, healthy portions.   The emphasis is on diet quality:   Load up on vegetables and fruits - one-half of your plate: Aim for color and variety, and remember that potatoes dont count.   Go for whole grains - one-quarter of your plate: Whole wheat, barley, wheat berries, quinoa, oats, brown rice, and foods made with them. If you want pasta, go with whole wheat pasta.   Protein power - one-quarter of your plate: Fish, chicken, beans, and nuts are all healthy, versatile protein sources. Limit red meat.   The diet, however, does go beyond the plate, offering a few other suggestions.   Use healthy plant oils, such as olive, canola, soy, corn, sunflower and peanut. Check the labels, and avoid partially hydrogenated oil, which have unhealthy trans fats.   If youre thirsty, drink  water. Coffee and tea are good in moderation, but skip sugary drinks and limit milk and dairy products to one or  two daily servings.   The type of carbohydrate in the diet is more important than the amount. Some sources of carbohydrates, such as vegetables, fruits, whole grains, and beans-are healthier than others.   Finally, stay active  Rolly Pancake, DO  04/18/2022 4:57 PM    [facility]

## 2022-04-20 ENCOUNTER — Telehealth: Payer: Self-pay | Admitting: Cardiology

## 2022-04-20 NOTE — Telephone Encounter (Signed)
Patient calling in bout her labs results. Please advise

## 2022-04-20 NOTE — Telephone Encounter (Signed)
Kaitlyn Tobb, DO 04/19/2022  8:05 AM EST Back to Top    Labs stable, sodium 126 still low but stable.    Spoke to patient-aware and verbalized understanding.

## 2022-05-04 ENCOUNTER — Other Ambulatory Visit: Payer: Self-pay | Admitting: Family Medicine

## 2022-05-04 ENCOUNTER — Telehealth: Payer: Self-pay | Admitting: Family Medicine

## 2022-05-04 DIAGNOSIS — F419 Anxiety disorder, unspecified: Secondary | ICD-10-CM

## 2022-05-04 NOTE — Telephone Encounter (Signed)
Prescription Request  05/04/2022  LOV: 01/01/2022  What is the name of the medication or equipment? cyanocobalamin (,VITAMIN B-12,) 1000 MCG/ML injection   Have you contacted your pharmacy to request a refill? No   Which pharmacy would you like this sent to?   [id number] [place], [place] - [address] [place] [address]Phone: [phone]: [phone]   Patient notified that their request is being sent to the clinical staff for review and that they should receive a response within 2 business days.   [phone]

## 2022-05-07 NOTE — Telephone Encounter (Signed)
Left voice mail for pt to call back for clarification of B12 injection

## 2022-05-08 ENCOUNTER — Other Ambulatory Visit: Payer: Self-pay

## 2022-05-08 DIAGNOSIS — D519 Vitamin B12 deficiency anemia, unspecified: Secondary | ICD-10-CM

## 2022-05-08 MED ORDER — CYANOCOBALAMIN 1000 MCG/ML IJ SOLN: 1000.0000 ug | INTRAMUSCULAR | 3 refills | Status: DC

## 2022-05-08 NOTE — Telephone Encounter (Signed)
Sent Medication to Pharmacy

## 2022-05-09 NOTE — Telephone Encounter (Signed)
Rx sent in patient aware but have not picked up from pharmacy.

## 2022-06-01 ENCOUNTER — Other Ambulatory Visit: Payer: Self-pay | Admitting: Cardiology

## 2022-06-01 DIAGNOSIS — I4819 Other persistent atrial fibrillation: Secondary | ICD-10-CM

## 2022-06-01 NOTE — Telephone Encounter (Signed)
Prescription refill request for Eliquis received. Indication: Afib  Last office visit: 04/17/22 (Tobb)  Scr: 0.85 (04/17/22)  Age: 89 Weight: 98.9kg  Appropriate dose. Refill sent.

## 2022-06-07 ENCOUNTER — Other Ambulatory Visit: Payer: Self-pay | Admitting: Cardiology

## 2022-06-07 ENCOUNTER — Other Ambulatory Visit: Payer: Self-pay | Admitting: Family Medicine

## 2022-06-07 DIAGNOSIS — F419 Anxiety disorder, unspecified: Secondary | ICD-10-CM

## 2022-06-07 DIAGNOSIS — E039 Hypothyroidism, unspecified: Secondary | ICD-10-CM

## 2022-06-07 DIAGNOSIS — E611 Iron deficiency: Secondary | ICD-10-CM

## 2022-06-07 NOTE — Telephone Encounter (Signed)
Rx refill sent to pharmacy. 

## 2022-06-08 ENCOUNTER — Other Ambulatory Visit: Payer: Self-pay | Admitting: Family Medicine

## 2022-06-08 DIAGNOSIS — E039 Hypothyroidism, unspecified: Secondary | ICD-10-CM

## 2022-06-08 NOTE — Telephone Encounter (Signed)
Last refill: 05/04/22 #60, 0 Last OV: 01/01/22 dx. Hypoatremia, anxiety Next OV: no appt scheduled

## 2022-06-21 ENCOUNTER — Telehealth: Payer: Self-pay | Admitting: Cardiology

## 2022-06-21 NOTE — Telephone Encounter (Signed)
Returned call to patient and advised her of the below from PharmD   Patient confirms that she does not take any over the counter medications.   Advised patient to call back to office with any issues, questions, or concerns. Patient verbalized understanding.

## 2022-06-21 NOTE — Telephone Encounter (Signed)
Bruising can be seen with Eliquis. She's on the correct dose and Eliquis has a lower risk of bleeding than other anticoagulants. Would make sure she isn't taking any OTC meds that can increase the risk of bruising/bleeding as well (like fish oil, ginkgo, ginseng, turmeric, NSAIDs, etcs)

## 2022-06-21 NOTE — Telephone Encounter (Signed)
Returned call to patient who states that she's had issues with bruising and places showing up on her arms since starting Eliquis. Patient states that these look like bruises. Patient denies any bleeding, pain, or itching. Patient states that she was given cream by her primary care due to places on her arm. Advised patient that it sounds like bruising- in which cream would not be effective. Advised patient if concern for rash she can contact her PCP. Patient states that the places just look like bruising.  Patient would like to know if there is anything she can do for this. And if eliquis dose is too high. Advised I would forward to PharmD for review.

## 2022-06-21 NOTE — Telephone Encounter (Signed)
  Pt c/o medication issue:  1. Name of Medication:   apixaban (ELIQUIS) 5 MG TABS tablet    2. How are you currently taking this medication (dosage and times per day)?   Take 1 tablet by mouth twice daily    3. Are you having a reaction (difficulty breathing--STAT)? No   4. What is your medication issue? Pt said, she's been having spots on both of her arms, it size like penny. She wants to know if she can put any cream on it or hot compress. Sh said, to call her at [phone]

## 2022-07-12 ENCOUNTER — Other Ambulatory Visit: Payer: Self-pay | Admitting: Family Medicine

## 2022-07-12 DIAGNOSIS — F419 Anxiety disorder, unspecified: Secondary | ICD-10-CM

## 2022-07-24 ENCOUNTER — Telehealth: Payer: Self-pay | Admitting: Cardiology

## 2022-07-24 NOTE — Telephone Encounter (Signed)
Pt c/o medication issue:  1. Name of Medication: torsemide (DEMADEX) 20 MG tablet   2. How are you currently taking this medication (dosage and times per day)? As prescribed   3. Are you having a reaction (difficulty breathing--STAT)?   4. What is your medication issue? Patient states that she does not believe this medication is working the way she would like for it to. Requesting call back to see about switching to a different medication.

## 2022-07-24 NOTE — Telephone Encounter (Signed)
Patient states she does not feel the torsemide is not working properly. She states her weight has stayed the same at 217. She states she is trying to lose weight. Informed patient that torsemide is not a weight loss medication. She has not gained any weight nor does she have any edema and excessive fluid. I did advise if she is not retaining fluid and gaining weight than it is working the way it needs to. She would like to know if you can start her on lasix. She state her sister lost 130lbs on lasix.

## 2022-07-25 NOTE — Telephone Encounter (Signed)
Left voicemail for patient to return call to office. 

## 2022-07-25 NOTE — Telephone Encounter (Signed)
Patient misunderstood she thought the torsemide was to make her lose weight, so she was weighing herself everyday. Explained the torsemide as well as lasix is to help keep the fluid off. She verbalized understanding.

## 2022-07-31 ENCOUNTER — Ambulatory Visit: Payer: Medicare Other | Attending: Cardiology | Admitting: Cardiology

## 2022-07-31 ENCOUNTER — Encounter: Payer: Self-pay | Admitting: Cardiology

## 2022-07-31 VITALS — BP 109/69 | HR 60 | Ht 66.0 in | Wt 218.0 lb

## 2022-07-31 DIAGNOSIS — I7101 Dissection of ascending aorta: Secondary | ICD-10-CM

## 2022-07-31 DIAGNOSIS — I5042 Chronic combined systolic (congestive) and diastolic (congestive) heart failure: Secondary | ICD-10-CM | POA: Diagnosis not present

## 2022-07-31 DIAGNOSIS — I4819 Other persistent atrial fibrillation: Secondary | ICD-10-CM | POA: Diagnosis not present

## 2022-07-31 DIAGNOSIS — G459 Transient cerebral ischemic attack, unspecified: Secondary | ICD-10-CM

## 2022-07-31 DIAGNOSIS — Z79899 Other long term (current) drug therapy: Secondary | ICD-10-CM | POA: Diagnosis not present

## 2022-07-31 DIAGNOSIS — I340 Nonrheumatic mitral (valve) insufficiency: Secondary | ICD-10-CM | POA: Diagnosis not present

## 2022-07-31 NOTE — Progress Notes (Signed)
Cardiology Office Note:    Date:  07/31/2022   ID:  Kaitlyn Rodriguez, DOB Dec 14, 1933, MRN [id number]  PCP:  Mliss Sax, MD  Cardiologist:  Thomasene Ripple, DO  Electrophysiologist:  None   Referring MD: Mliss Sax,*   " I am doing well"  History of Present Illness:    Kaitlyn Rodriguez is a 89 y.o. female with a hx of  repair of type a dissection in 2020, permanent atrial fibrillation on anticoagulation with Eliquis 5 mg twice daily, recently diagnosed depressed ejection fraction, history of CVA, presents today for follow-up visit.  I saw the patient on 11/02/2021 at that time she had significant fluid overload.  I increased her Lasix to twice a day for 5 days and added metolazone 5 mg x 3 days.  At her lat visit in 10/2021 she was doing well - had good response to the diuretics. She did for a while but now is not quite responding.   At her visit on 03/2022 at her visit we stopped the lasix and started torsemide.   She is here today with her son for a follow up visit.      Past Medical History:  Diagnosis Date   Anxiety disorder, unspecified    Benign essential hypertension    Dissection of unspecified site of aorta (HCC) 11/2018   Heart failure, unspecified (HCC)    History of aortic valve replacement    Hypothyroidism, unspecified    Unspecified osteoarthritis, unspecified site    Vitamin B12 deficiency anemia     Past Surgical History:  Procedure Laterality Date   ABDOMINAL HYSTERECTOMY     at age 60   REPLACEMENT ASCENDING AORTA N/A 12/20/2018   Procedure: REPAIR OF TYPE A AORTIC DISSECTION USING HEMASHIELD PLATINUM AND GRAFT;  Surgeon: Linden Dolin, MD;  Location: [facility] OR;  Service: Open Heart Surgery;  Laterality: N/A;   STERNAL CLOSURE  12/20/2018   Procedure: STERNAL PLATING;  Surgeon: Linden Dolin, MD;  Location: [facility] OR;  Service: Open Heart Surgery;;    Current Medications: Current Meds  Medication Sig   ALPRAZolam (XANAX) 0.25 MG  tablet TAKE 1 TABLET BY MOUTH TWICE A DAY AS NEEDED FOR ANXIETY   apixaban (ELIQUIS) 5 MG TABS tablet Take 1 tablet by mouth twice daily   cyanocobalamin (VITAMIN B12) 1000 MCG/ML injection Inject 1 mL (1,000 mcg total) into the muscle every 30 (thirty) days.   FEROSUL 325 (65 Fe) MG tablet TAKE 1 TABLET BY MOUTH EVERY OTHER DAY; NEEDS APPOINTMENT FOR MORE REFILLS   losartan (COZAAR) 25 MG tablet Take 1 tablet (25 mg total) by mouth daily.   metolazone (ZAROXOLYN) 5 MG tablet Take 1 tablet (5 mg total) by mouth once a week. Take 30 minutes before first Lasix dose   metoprolol succinate (TOPROL-XL) 50 MG 24 hr tablet Take 1 tablet (50 mg total) by mouth 2 (two) times daily.   Multiple Vitamin (MULTIVITAMIN WITH MINERALS) TABS tablet Take 1 tablet by mouth daily.   NYSTATIN powder APPLY 1 POWDER TOPICALLY TWICE DAILY FOR 2 WEEKS   SYNTHROID 125 MCG tablet TAKE 1 TABLET BY MOUTH EVERY MORNING.   torsemide (DEMADEX) 20 MG tablet Take 20 mg (1 tablet) daily and take 20 mg (1 tablet) every other night.   triamcinolone ointment (KENALOG) 0.5 % Apply 1 Application topically 2 (two) times daily.     Allergies:   Celexa [citalopram]   Social History   Socioeconomic History  Marital status: Widowed    Spouse name: Not on file   Number of children: Not on file   Years of education: Not on file   Highest education level: Not on file  Occupational History   Not on file  Tobacco Use   Smoking status: Never   Smokeless tobacco: Never  Vaping Use   Vaping Use: Never used  Substance and Sexual Activity   Alcohol use: Never   Drug use: Never   Sexual activity: Not Currently  Other Topics Concern   Not on file  Social History Narrative   Not on file   Social Determinants of Health   Financial Resource Strain: Low Risk  (04/16/2022)   Overall Financial Resource Strain (CARDIA)    Difficulty of Paying Living Expenses: Not hard at all  Food Insecurity: No Food Insecurity (04/16/2022)   Hunger  Vital Sign    Worried About Running Out of Food in the Last Year: Never true    Ran Out of Food in the Last Year: Never true  Transportation Needs: No Transportation Needs (04/16/2022)   PRAPARE - Administrator, Civil Service (Medical): No    Lack of Transportation (Non-Medical): No  Physical Activity: Sufficiently Active (04/16/2022)   Exercise Vital Sign    Days of Exercise per Week: 7 days    Minutes of Exercise per Session: 30 min  Stress: No Stress Concern Present (04/16/2022)   [employer] of Occupational Health - Occupational Stress Questionnaire    Feeling of Stress : Not at all  Social Connections: Not on file     Family History: The patient's family history includes Hypertension in her father.  ROS:   Review of Systems  Constitution: Negative for decreased appetite, fever and weight gain.  HENT: Negative for congestion, ear discharge, hoarse voice and sore throat.   Eyes: Negative for discharge, redness, vision loss in right eye and visual halos.  Cardiovascular: Negative for chest pain, dyspnea on exertion, leg swelling, orthopnea and palpitations.  Respiratory: Negative for cough, hemoptysis, shortness of breath and snoring.   Endocrine: Negative for heat intolerance and polyphagia.  Hematologic/Lymphatic: Negative for bleeding problem. Does not bruise/bleed easily.  Skin: Negative for flushing, nail changes, rash and suspicious lesions.  Musculoskeletal: Negative for arthritis, joint pain, muscle cramps, myalgias, neck pain and stiffness.  Gastrointestinal: Negative for abdominal pain, bowel incontinence, diarrhea and excessive appetite.  Genitourinary: Negative for decreased libido, genital sores and incomplete emptying.  Neurological: Negative for brief paralysis, focal weakness, headaches and loss of balance.  Psychiatric/Behavioral: Negative for altered mental status, depression and suicidal ideas.  Allergic/Immunologic: Negative for HIV exposure and  persistent infections.    EKGs/Labs/Other Studies Reviewed:    The following studies were reviewed today:   EKG: None today  Echo 12/04/2020 IMPRESSIONS   1. Left ventricular ejection fraction, by estimation, is 35 to 40%. The left ventricle has moderately decreased function. The left ventricle has  no regional wall motion abnormalities. Left ventricular diastolic parameters are indeterminate.   2. Right ventricular systolic function is moderately reduced. The right ventricular size is mildly enlarged. There is mildly elevated pulmonary artery systolic pressure. The estimated right ventricular systolic pressure is 36.5 mmHg.   3. Left atrial size was severely dilated.   4. Right atrial size was severely dilated.   5. The mitral valve is normal in structure. Severe mitral valve  regurgitation. No evidence of mitral stenosis.   6. Tricuspid valve regurgitation is moderate.   7.  The aortic valve is calcified. Aortic valve regurgitation is mild.  Mild to moderate aortic valve sclerosis/calcification is present, without  any evidence of aortic stenosis.   8. Pulmonic valve regurgitation is moderate.   9. Aortic dilatation noted and root/ascending aorta has been  repaired/replaced. There is mild dilatation of the ascending aorta,  measuring 40 mm.  10. The inferior vena cava is normal in size with greater than 50%  respiratory variability, suggesting right atrial pressure of 3 mmHg.   FINDINGS   Left Ventricle: Left ventricular ejection fraction, by estimation, is 35  to 40%. The left ventricle has moderately decreased function. The left  ventricle has no regional wall motion abnormalities. The left ventricular  internal cavity size was normal in  size. There is no left ventricular hypertrophy. Left ventricular diastolic  parameters are indeterminate.      LV Wall Scoring:  The mid and distal inferior wall and mid inferolateral segment are  akinetic.   Right Ventricle: The right  ventricular size is mildly enlarged. No  increase in right ventricular wall thickness. Right ventricular systolic  function is moderately reduced. There is mildly elevated pulmonary artery  systolic pressure. The tricuspid  regurgitant velocity is 2.32 m/s, and with an assumed right atrial  pressure of 15 mmHg, the estimated right ventricular systolic pressure is  36.5 mmHg.   Left Atrium: Left atrial size was severely dilated.   Right Atrium: Right atrial size was severely dilated.   Pericardium: There is no evidence of pericardial effusion.   Mitral Valve: The mitral valve is normal in structure. Severe mitral valve  regurgitation, with posteriorly-directed jet. No evidence of mitral valve  stenosis.   Tricuspid Valve: The tricuspid valve is normal in structure. Tricuspid  valve regurgitation is moderate . No evidence of tricuspid stenosis.   Aortic Valve: The aortic valve is calcified. Aortic valve regurgitation is  mild. Mild to moderate aortic valve sclerosis/calcification is present,  without any evidence of aortic stenosis.   Pulmonic Valve: The pulmonic valve was normal in structure. Pulmonic valve  regurgitation is moderate. No evidence of pulmonic stenosis.   Aorta: Aortic dilatation noted and the aortic root/ascending aorta has  been repaired/replaced. There is mild dilatation of the ascending aorta,  measuring 40 mm.   Venous: The inferior vena cava is normal in size with greater than 50%  respiratory variability, suggesting right atrial pressure of 3 mmHg.   IAS/Shunts: No atrial level shunt detected by color flow Doppler  Recent Labs: 11/02/2021: NT-Pro BNP 3,040 04/17/2022: ALT 8; BUN 17; Creatinine, Ser 0.85; Hemoglobin 14.5; Magnesium 1.6; Platelets 202; Potassium 4.1; Sodium 126  Recent Lipid Panel    Component Value Date/Time   CHOL 154 12/04/2020 0422   TRIG 72 12/04/2020 0422   HDL 46 12/04/2020 0422   CHOLHDL 3.3 12/04/2020 0422   VLDL 14 12/04/2020  0422   LDLCALC 94 12/04/2020 0422    Physical Exam:    VS:  BP 109/69 (BP Location: Right Arm, Patient Position: Sitting, Cuff Size: Normal)   Pulse 60   Ht 5\' 6"  (1.676 m)   Wt 218 lb (98.9 kg)   SpO2 94%   BMI 35.19 kg/m     Wt Readings from Last 3 Encounters:  07/31/22 218 lb (98.9 kg)  04/17/22 218 lb (98.9 kg)  04/16/22 218 lb (98.9 kg)     GEN: Well nourished, well developed in no acute distress HEENT: Normal NECK: No JVD; No carotid bruits LYMPHATICS: No lymphadenopathy CARDIAC:  S1S2 noted,RRR, no murmurs, rubs, gallops RESPIRATORY:  Clear to auscultation without rales, wheezing or rhonchi  ABDOMEN: Soft, non-tender, non-distended, +bowel sounds, no guarding. EXTREMITIES: +1 bilaterally extremity edema, No cyanosis, no clubbing MUSCULOSKELETAL:  No deformity  SKIN: Warm and dry NEUROLOGIC:  Alert and oriented x 3, non-focal PSYCHIATRIC:  Normal affect, good insight  ASSESSMENT:    1. Medication management   2. Persistent atrial fibrillation (HCC)   3. Chronic combined systolic and diastolic congestive heart failure (HCC)   4. Nonrheumatic mitral valve regurgitation   5. Dissecting aneurysm of thoracic aorta, Stanford type A (HCC)   6. TIA (transient ischemic attack)    PLAN:    She has had great improvement on her current dose of duiretics. Will continue. She is euvolemic in the office today.   Will get blood work today.   The patient is in agreement with the above plan. The patient eft the office in stable condition.  The patient will follow up in 3 months or sooner if needed.   Medication Adjustments/Labs and Tests Ordered: Current medicines are reviewed at length with the patient today.  Concerns regarding medicines are outlined above.  Orders Placed This Encounter  Procedures   Basic metabolic panel   Magnesium   No orders of the defined types were placed in this encounter.   Patient Instructions  Medication Instructions:   Your physician  recommends that you continue on your current medications as directed. Please refer to the Current Medication list given to you today.   *If you need a refill on your cardiac medications before your next appointment, please call your pharmacy*   Lab Work: BMET, MAGNESIUM  If you have labs (blood work) drawn today and your tests are completely normal, you will receive your results only by: MyChart Message (if you have MyChart) OR A paper copy in the mail If you have any lab test that is abnormal or we need to change your treatment, we will call you to review the results.  Follow-Up: At [facility], you and your health needs are our priority.  As part of our continuing mission to provide you with exceptional heart care, we have created designated Provider Care Teams.  These Care Teams include your primary Cardiologist (physician) and Advanced Practice Providers (APPs -  Physician Assistants and Nurse Practitioners) who all work together to provide you with the care you need, when you need it.  Your next appointment:   6 month(s)  Provider:   Thomasene Ripple, DO     Other Instructions  IF YOU GAIN MORE THAN 3LB IN ONE DAY OR  5LBS IN A WEEK, TAKE AN EXTRA LASIX PILL   Adopting a Healthy Lifestyle.  Know what a healthy weight is for you (roughly BMI <25) and aim to maintain this   Aim for 7+ servings of fruits and vegetables daily   65-80+ fluid ounces of water or unsweet tea for healthy kidneys   Limit to max 1 drink of alcohol per day; avoid smoking/tobacco   Limit animal fats in diet for cholesterol and heart health - choose grass fed whenever available   Avoid highly processed foods, and foods high in saturated/trans fats   Aim for low stress - take time to unwind and care for your mental health   Aim for 150 min of moderate intensity exercise weekly for heart health, and weights twice weekly for bone health   Aim for 7-9 hours of sleep daily   When it  comes to  diets, agreement about the perfect plan isnt easy to find, even among the experts. Experts at the [facility] [employer] developed an idea known as the Healthy Eating Plate. Just imagine a plate divided into logical, healthy portions.   The emphasis is on diet quality:   Load up on vegetables and fruits - one-half of your plate: Aim for color and variety, and remember that potatoes dont count.   Go for whole grains - one-quarter of your plate: Whole wheat, barley, wheat berries, quinoa, oats, brown rice, and foods made with them. If you want pasta, go with whole wheat pasta.   Protein power - one-quarter of your plate: Fish, chicken, beans, and nuts are all healthy, versatile protein sources. Limit red meat.   The diet, however, does go beyond the plate, offering a few other suggestions.   Use healthy plant oils, such as olive, canola, soy, corn, sunflower and peanut. Check the labels, and avoid partially hydrogenated oil, which have unhealthy trans fats.   If youre thirsty, drink water. Coffee and tea are good in moderation, but skip sugary drinks and limit milk and dairy products to one or two daily servings.   The type of carbohydrate in the diet is more important than the amount. Some sources of carbohydrates, such as vegetables, fruits, whole grains, and beans-are healthier than others.   Finally, stay active  Signed, Thomasene Ripple, DO  07/31/2022 9:20 PM    [facility]

## 2022-07-31 NOTE — Patient Instructions (Signed)
Medication Instructions:   Your physician recommends that you continue on your current medications as directed. Please refer to the Current Medication list given to you today.   *If you need a refill on your cardiac medications before your next appointment, please call your pharmacy*   Lab Work: BMET, MAGNESIUM  If you have labs (blood work) drawn today and your tests are completely normal, you will receive your results only by: MyChart Message (if you have MyChart) OR A paper copy in the mail If you have any lab test that is abnormal or we need to change your treatment, we will call you to review the results.  Follow-Up: At [facility], you and your health needs are our priority.  As part of our continuing mission to provide you with exceptional heart care, we have created designated Provider Care Teams.  These Care Teams include your primary Cardiologist (physician) and Advanced Practice Providers (APPs -  Physician Assistants and Nurse Practitioners) who all work together to provide you with the care you need, when you need it.  Your next appointment:   6 month(s)  Provider:   Thomasene Ripple, DO     Other Instructions  IF YOU GAIN MORE THAN 3LB IN ONE DAY OR  5LBS IN A WEEK, TAKE AN EXTRA LASIX PILL

## 2022-08-01 LAB — MAGNESIUM: Magnesium: 1.9 mg/dL (ref 1.6–2.3)

## 2022-08-02 ENCOUNTER — Telehealth: Payer: Self-pay | Admitting: Cardiology

## 2022-08-02 NOTE — Telephone Encounter (Signed)
Lab results given magnesium normal.  She ask about her "salt" and wants to discuss in detail. Advised several times,  the result  has not been resulted.

## 2022-08-02 NOTE — Telephone Encounter (Signed)
Patient called to follow up on lab test results.

## 2022-08-03 ENCOUNTER — Other Ambulatory Visit: Payer: Self-pay

## 2022-08-03 ENCOUNTER — Telehealth: Payer: Self-pay

## 2022-08-03 DIAGNOSIS — Z79899 Other long term (current) drug therapy: Secondary | ICD-10-CM

## 2022-08-03 NOTE — Progress Notes (Signed)
Order for BMET placed. Original order was for clinic collect not lab collect.  Called pt's son to let him know his mother needs additional labs drawn. No answer at this time. Left a message for him to return the call or go to the [place] office to have labs drawn.

## 2022-08-03 NOTE — Telephone Encounter (Signed)
Called pt's son to let him know his mother needs additional labs drawn. No answer at this time. Left a message for him to return the call or go to the [place] office to have labs drawn

## 2022-08-05 ENCOUNTER — Other Ambulatory Visit: Payer: Self-pay | Admitting: Cardiology

## 2022-08-06 ENCOUNTER — Other Ambulatory Visit: Payer: Self-pay | Admitting: Cardiology

## 2022-08-06 NOTE — Telephone Encounter (Signed)
Patient called to follow-up on lab work.

## 2022-08-06 NOTE — Telephone Encounter (Signed)
Called pt to explain the orders that was placed was ordered incorrectly. They have been corrected and they can go to the [place] office to have the labs drawn. Pt wanted to have someone come to the house to draw the blood. I informed her we do not have anyone that does that. She will call her insurance company to see if they offer that service, since it's hard to get her into he office.

## 2022-08-06 NOTE — Telephone Encounter (Signed)
Patient aware she needs to have labs drawn

## 2022-08-06 NOTE — Telephone Encounter (Signed)
Patient would like to know if she really needs to have labs drawn. She states its hard for her to get in the car and her son have other things to do. She would like to know why was all labs were not drawn at the same time.

## 2022-08-07 ENCOUNTER — Other Ambulatory Visit: Payer: Self-pay

## 2022-08-07 DIAGNOSIS — D519 Vitamin B12 deficiency anemia, unspecified: Secondary | ICD-10-CM

## 2022-08-07 MED ORDER — CYANOCOBALAMIN 1000 MCG/ML IJ SOLN
1000.0000 ug | INTRAMUSCULAR | 3 refills | Status: AC
Start: 2022-08-07 — End: ?

## 2022-08-16 ENCOUNTER — Other Ambulatory Visit: Payer: Self-pay | Admitting: Cardiology

## 2022-08-16 ENCOUNTER — Other Ambulatory Visit: Payer: Self-pay | Admitting: Family Medicine

## 2022-08-16 DIAGNOSIS — F419 Anxiety disorder, unspecified: Secondary | ICD-10-CM

## 2022-09-04 ENCOUNTER — Other Ambulatory Visit: Payer: Self-pay | Admitting: Family Medicine

## 2022-09-04 DIAGNOSIS — E039 Hypothyroidism, unspecified: Secondary | ICD-10-CM

## 2022-09-12 DIAGNOSIS — Z79899 Other long term (current) drug therapy: Secondary | ICD-10-CM | POA: Diagnosis not present

## 2022-09-13 ENCOUNTER — Telehealth: Payer: Self-pay | Admitting: Cardiology

## 2022-09-13 LAB — BASIC METABOLIC PANEL
BUN/Creatinine Ratio: 30 — ABNORMAL HIGH (ref 12–28)
BUN: 33 mg/dL — ABNORMAL HIGH (ref 8–27)
CO2: 27 mmol/L (ref 20–29)
Calcium: 9.8 mg/dL (ref 8.7–10.3)
Chloride: 85 mmol/L — ABNORMAL LOW (ref 96–106)
Creatinine, Ser: 1.09 mg/dL — ABNORMAL HIGH (ref 0.57–1.00)
Glucose: 134 mg/dL — ABNORMAL HIGH (ref 70–99)
Potassium: 3.9 mmol/L (ref 3.5–5.2)
Sodium: 130 mmol/L — ABNORMAL LOW (ref 134–144)
eGFR: 49 mL/min/{1.73_m2} — ABNORMAL LOW (ref >59)

## 2022-09-13 NOTE — Telephone Encounter (Signed)
Gave lab results to patient, discussed in detail.  She asked to repeat to her son, which Was done but then he hung up on me.  She states understanding of results. Advised to continue to watch sodium intake and to take tylenol rather than NSAIDs

## 2022-09-13 NOTE — Telephone Encounter (Signed)
Called back and spoke son and gave all instructions again.  He states understanding.

## 2022-09-13 NOTE — Telephone Encounter (Signed)
Patient calling in about labs results. Please advise

## 2022-09-13 NOTE — Telephone Encounter (Signed)
Patient is returning phone call.  °

## 2022-09-19 ENCOUNTER — Other Ambulatory Visit: Payer: Self-pay | Admitting: Family Medicine

## 2022-09-19 DIAGNOSIS — F419 Anxiety disorder, unspecified: Secondary | ICD-10-CM

## 2022-09-24 ENCOUNTER — Telehealth: Payer: Self-pay | Admitting: Cardiology

## 2022-09-24 NOTE — Telephone Encounter (Signed)
  The patient is requesting a copy of her lab results from September 12, 2022, to be mailed to her address.

## 2022-09-24 NOTE — Telephone Encounter (Signed)
Lab results mailed to patient today.

## 2022-10-01 ENCOUNTER — Other Ambulatory Visit: Payer: Self-pay | Admitting: Cardiology

## 2022-10-05 ENCOUNTER — Telehealth: Payer: Self-pay | Admitting: Cardiology

## 2022-10-05 NOTE — Telephone Encounter (Signed)
Patient is requesting calling to discuss her labs. Please advise.

## 2022-10-05 NOTE — Telephone Encounter (Signed)
Called pt back. Explained her labs to her and her son. They verbalized understanding. Advised her to call back with any other questions or concerns.

## 2022-10-21 ENCOUNTER — Other Ambulatory Visit: Payer: Self-pay | Admitting: Cardiology

## 2022-10-24 ENCOUNTER — Other Ambulatory Visit: Payer: Self-pay | Admitting: Family Medicine

## 2022-10-24 DIAGNOSIS — F419 Anxiety disorder, unspecified: Secondary | ICD-10-CM

## 2022-10-25 ENCOUNTER — Telehealth: Payer: Self-pay | Admitting: Family Medicine

## 2022-10-25 DIAGNOSIS — F419 Anxiety disorder, unspecified: Secondary | ICD-10-CM

## 2022-10-25 NOTE — Telephone Encounter (Signed)
Pt would like a call to discuss her med ALPRAZolam Prudy Feeler) 0.25 MG tablet [[id number]] She wonders if it is good for her?  She also may run out before her appt on 9/3.

## 2022-10-26 MED ORDER — ALPRAZOLAM 0.25 MG PO TABS
0.2500 mg | ORAL_TABLET | Freq: Two times a day (BID) | ORAL | 0 refills | Status: AC | PRN
Start: 2022-10-26 — End: 2022-10-31

## 2022-10-26 NOTE — Addendum Note (Signed)
Addended by: Andrez Grime on: 10/26/2022 01:06 PM   Modules accepted: Orders

## 2022-10-30 ENCOUNTER — Ambulatory Visit: Payer: Medicare Other | Admitting: Family Medicine

## 2022-11-01 ENCOUNTER — Ambulatory Visit (INDEPENDENT_AMBULATORY_CARE_PROVIDER_SITE_OTHER): Payer: Medicare Other | Admitting: Family Medicine

## 2022-11-01 ENCOUNTER — Encounter: Payer: Self-pay | Admitting: Family Medicine

## 2022-11-01 VITALS — BP 92/68 | HR 90 | Temp 98.1°F | Ht 66.0 in

## 2022-11-01 DIAGNOSIS — D518 Other vitamin B12 deficiency anemias: Secondary | ICD-10-CM | POA: Diagnosis not present

## 2022-11-01 DIAGNOSIS — I959 Hypotension, unspecified: Secondary | ICD-10-CM

## 2022-11-01 DIAGNOSIS — J3081 Allergic rhinitis due to animal (cat) (dog) hair and dander: Secondary | ICD-10-CM | POA: Diagnosis not present

## 2022-11-01 DIAGNOSIS — F418 Other specified anxiety disorders: Secondary | ICD-10-CM

## 2022-11-01 DIAGNOSIS — E611 Iron deficiency: Secondary | ICD-10-CM | POA: Diagnosis not present

## 2022-11-01 DIAGNOSIS — J00 Acute nasopharyngitis [common cold]: Secondary | ICD-10-CM

## 2022-11-01 DIAGNOSIS — E039 Hypothyroidism, unspecified: Secondary | ICD-10-CM

## 2022-11-01 MED ORDER — FERROUS SULFATE 325 (65 FE) MG PO TABS: 325.0000 mg | ORAL_TABLET | Freq: Every day | ORAL | 0 refills | Status: DC

## 2022-11-01 MED ORDER — SERTRALINE HCL 25 MG PO TABS
25.0000 mg | ORAL_TABLET | Freq: Every day | ORAL | 3 refills | Status: DC
Start: 2022-11-01 — End: 2022-12-13

## 2022-11-01 MED ORDER — LORATADINE 10 MG PO TABS
10.0000 mg | ORAL_TABLET | Freq: Every day | ORAL | 11 refills | Status: AC
Start: 2022-11-01 — End: ?

## 2022-11-01 NOTE — Progress Notes (Signed)
Established Patient Office Visit   Subjective:  Patient ID: Kaitlyn Rodriguez, female    DOB: 1933/06/11  Age: 89 y.o. MRN: [id number]  Chief Complaint  Patient presents with   Anxiety    Pt states she is very nervous x 2 months. Pt states nervousness has increased even with xanax.    Conjunctivitis    Red and dry eyes x 2 months.     Anxiety Patient reports no shortness of breath.    Conjunctivitis  Associated symptoms include cough. Pertinent negatives include no abdominal pain, no wheezing, no rash, no eye discharge and no eye redness.   Encounter Diagnoses  Name Primary?   Hypotension, unspecified hypotension type Yes   Iron deficiency    Anxiety with depression    Other vitamin B12 deficiency anemia    Hypothyroidism, unspecified type    Acute rhinitis    For follow-up of above.  Increased nervousness since her diuretics were adjusted for treatment of CHF.Marland Kitchen  Continues with Xanax 0.25 twice daily.  Continues with cyanocobalamin mean 1000 mcg daily.  Continues with iron sulfate 325 mg daily.  Continues levothyroxine 125 mg.  Eyes have been dry and itchy without discharge.  Clear rhinorrhea and postnasal drip.  Cough occasionally productive of the same.  No fevers or chills.  Symptoms are worse after she is around the family canines.   Review of Systems  Constitutional: Negative.   HENT: Negative.    Eyes:  Negative for blurred vision, discharge and redness.  Respiratory:  Positive for cough. Negative for sputum production, shortness of breath and wheezing.   Cardiovascular: Negative.   Gastrointestinal:  Negative for abdominal pain.  Genitourinary: Negative.   Musculoskeletal: Negative.  Negative for myalgias.  Skin:  Negative for rash.  Neurological:  Negative for tingling, loss of consciousness and weakness.  Endo/Heme/Allergies:  Negative for polydipsia.     Current Outpatient Medications:    ALPRAZolam (XANAX) 0.25 MG tablet, Take by mouth., Disp: , Rfl:     apixaban (ELIQUIS) 5 MG TABS tablet, Take 1 tablet by mouth twice daily, Disp: 60 tablet, Rfl: 11   cyanocobalamin (VITAMIN B12) 1000 MCG/ML injection, Inject 1 mL (1,000 mcg total) into the muscle every 30 (thirty) days., Disp: 1 mL, Rfl: 3   loratadine (CLARITIN) 10 MG tablet, Take 1 tablet (10 mg total) by mouth daily., Disp: 30 tablet, Rfl: 11   losartan (COZAAR) 25 MG tablet, Take 1 tablet by mouth once daily, Disp: 90 tablet, Rfl: 1   metolazone (ZAROXOLYN) 5 MG tablet, TAKE 1 TABLET BY MOUTH ONCE A WEEK 30  MINUTES  BEFORE  FIRST  LASIX  DOSE, Disp: 10 tablet, Rfl: 0   metoprolol succinate (TOPROL-XL) 50 MG 24 hr tablet, Take 1 tablet (50 mg total) by mouth 2 (two) times daily., Disp: 180 tablet, Rfl: 3   NYSTATIN powder, APPLY 1 POWDER TOPICALLY TWICE DAILY FOR 2 WEEKS, Disp: 15 g, Rfl: 0   sertraline (ZOLOFT) 25 MG tablet, Take 1 tablet (25 mg total) by mouth daily., Disp: 30 tablet, Rfl: 3   SYNTHROID 125 MCG tablet, TAKE 1 TABLET BY MOUTH ONCE DAILY IN THE MORNING . APPOINTMENT REQUIRED FOR FUTURE REFILLS, Disp: 90 tablet, Rfl: 0   torsemide (DEMADEX) 20 MG tablet, Take 20 mg (1 tablet) daily and take 20 mg (1 tablet) every other night., Disp: 180 tablet, Rfl: 3   triamcinolone ointment (KENALOG) 0.5 %, Apply 1 Application topically 2 (two) times daily., Disp: 30 g, Rfl: 0  ferrous sulfate (FEROSUL) 325 (65 FE) MG tablet, Take 1 tablet (325 mg total) by mouth daily with breakfast., Disp: 45 tablet, Rfl: 0   Multiple Vitamin (MULTIVITAMIN WITH MINERALS) TABS tablet, Take 1 tablet by mouth daily. (Patient not taking: Reported on 11/01/2022), Disp:  , Rfl:    Objective:     BP 92/68   Pulse 90   Temp 98.1 F (36.7 C)   Ht 5\' 6"  (1.676 m)   SpO2 94%   BMI 35.19 kg/m    Physical Exam Constitutional:      General: She is not in acute distress.    Appearance: Normal appearance. She is not ill-appearing, toxic-appearing or diaphoretic.  HENT:     Head: Normocephalic and atraumatic.      Right Ear: External ear normal.     Left Ear: External ear normal.     Mouth/Throat:     Mouth: Mucous membranes are moist.     Pharynx: Oropharynx is clear. No oropharyngeal exudate or posterior oropharyngeal erythema.  Eyes:     General: No scleral icterus.       Right eye: No discharge.        Left eye: No discharge.     Extraocular Movements: Extraocular movements intact.     Conjunctiva/sclera: Conjunctivae normal.     Pupils: Pupils are equal, round, and reactive to light.  Cardiovascular:     Rate and Rhythm: Normal rate and regular rhythm.     Heart sounds: Heart sounds are distant.  Pulmonary:     Effort: Pulmonary effort is normal. No respiratory distress.     Breath sounds: Normal breath sounds. No rhonchi.  Abdominal:     General: Bowel sounds are normal.     Tenderness: There is no abdominal tenderness. There is no guarding.  Musculoskeletal:     Cervical back: No rigidity or tenderness.     Right lower leg: No edema.     Left lower leg: No edema.  Skin:    General: Skin is warm and dry.  Neurological:     Mental Status: She is alert and oriented to person, place, and time.  Psychiatric:        Mood and Affect: Mood normal.        Behavior: Behavior normal.      No results found for any visits on 11/01/22.    The ASCVD Risk score (Arnett DK, et al., 2019) failed to calculate for the following reasons:   The 2019 ASCVD risk score is only valid for ages 61 to 57    Assessment & Plan:   Hypotension, unspecified hypotension type  Iron deficiency -     Ferrous Sulfate; Take 1 tablet (325 mg total) by mouth daily with breakfast.  Dispense: 45 tablet; Refill: 0 -     CBC -     Iron, TIBC and Ferritin Panel  Anxiety with depression -     Sertraline HCl; Take 1 tablet (25 mg total) by mouth daily.  Dispense: 30 tablet; Refill: 3  Other vitamin B12 deficiency anemia -     B12 and Folate Panel  Hypothyroidism, unspecified type -     TSH  Acute  rhinitis -     Loratadine; Take 1 tablet (10 mg total) by mouth daily.  Dispense: 30 tablet; Refill: 11    Return in about 6 weeks (around 12/13/2022).  Advised patient to report low blood pressure to cardiologist.  Will start low-dose sertraline for anxiety and depression.  Continue alprazolam.  Rechecking B12, iron. Mliss Sax, MD

## 2022-11-02 ENCOUNTER — Other Ambulatory Visit: Payer: Self-pay

## 2022-11-02 ENCOUNTER — Telehealth: Payer: Self-pay | Admitting: Cardiology

## 2022-11-02 LAB — CBC
HCT: 39.9 % (ref 36.0–46.0)
Hemoglobin: 13.4 g/dL (ref 12.0–15.0)
MCHC: 33.5 g/dL (ref 30.0–36.0)
MCV: 97 fl (ref 78.0–100.0)
Platelets: 217 10*3/uL (ref 150.0–400.0)
RBC: 4.12 Mil/uL (ref 3.87–5.11)
RDW: 13.7 % (ref 11.5–15.5)
WBC: 8.9 10*3/uL (ref 4.0–10.5)

## 2022-11-02 LAB — IRON,TIBC AND FERRITIN PANEL
%SAT: 18 % (ref 16–45)
Ferritin: 189 ng/mL (ref 16–288)
Iron: 58 ug/dL (ref 45–160)
TIBC: 331 ug/dL (ref 250–450)

## 2022-11-02 LAB — TSH: TSH: 1.54 u[IU]/mL (ref 0.35–5.50)

## 2022-11-02 LAB — B12 AND FOLATE PANEL
Folate: 14.1 ng/mL (ref >5.9)
Vitamin B-12: 702 pg/mL (ref 211–911)

## 2022-11-02 MED ORDER — METOPROLOL SUCCINATE ER 50 MG PO TB24: 25.0000 mg | ORAL_TABLET | Freq: Two times a day (BID) | ORAL | Status: DC

## 2022-11-02 NOTE — Telephone Encounter (Signed)
Returned call to pt with Dr. Mallory Shirk recommendation below. Phone just rings busy.  Please let her know I am ok if her pcp would like to manage her diuretics. From my perspective this is the dose that have kept her from hospital heart failure readmission.

## 2022-11-02 NOTE — Telephone Encounter (Signed)
Pt c/o medication issue:  1. Name of Medication:   torsemide (DEMADEX) 20 MG tablet    2. How are you currently taking this medication (dosage and times per day)?  Take 20 mg (1 tablet) daily and take 20 mg (1 tablet) every other night.    3. Are you having a reaction (difficulty breathing--STAT)? No  4. What is your medication issue? Patient is calling because she saw her PCP yesterday. She was told that she might be taking too much of this medication. Patient stated she doesn't have any swelling in her legs. Please advise.

## 2022-11-02 NOTE — Telephone Encounter (Signed)
Returned call to pt. Patient states her family doctor is concerned she may be taking too much of her torsemide. Pt would like to come off of it or decrease it. She has no swelling and her pcp did labs yesterday and told pt today they were all perfect. Please advise.

## 2022-11-02 NOTE — Progress Notes (Signed)
Medication list updated.

## 2022-11-02 NOTE — Telephone Encounter (Signed)
Medication list updated.

## 2022-11-02 NOTE — Telephone Encounter (Signed)
Returned call to pt and advised of Dr. Barnetta Hammersmith recommendation on the diuretics and pt verbalized understanding. Pt wanted Dr. Barnetta Hammersmith opinion on her Metoprolol and if it could be decreased some because her BP yesterday at Dr. Blenda Bridegroom office was 92/60 and the doctor told her that was too low. Please advise.

## 2022-11-02 NOTE — Telephone Encounter (Signed)
Returned call to pt. And advised her of Dr. Barnetta Hammersmith recommendation. Also, informed pt son to keep a BP and HR log for two weeks and give [place] a call with the results. Son verbalized understanding.  Per Dr. Servando Salina Please decrease the metoprolol to 25 mg

## 2022-11-06 ENCOUNTER — Telehealth: Payer: Self-pay | Admitting: Family Medicine

## 2022-11-06 ENCOUNTER — Telehealth: Payer: Self-pay | Admitting: Cardiology

## 2022-11-06 DIAGNOSIS — F418 Other specified anxiety disorders: Secondary | ICD-10-CM

## 2022-11-06 MED ORDER — ALPRAZOLAM 0.25 MG PO TABS: 0.2500 mg | ORAL_TABLET | Freq: Two times a day (BID) | ORAL | 0 refills | Status: DC | PRN

## 2022-11-06 NOTE — Telephone Encounter (Signed)
Pt c/o medication issue:  1. Name of Medication:  Losartan 25 MG  2. How are you currently taking this medication (dosage and times per day)?  Once daily  3. Are you having a reaction (difficulty breathing--STAT)?   4. What is your medication issue?   See previous encounter.  Patient's son states Metoprolol was already deceased due to low BP and he would like to know if Losartan needs to be decresed as well.

## 2022-11-06 NOTE — Telephone Encounter (Signed)
Son called to see if patietn should reduce her Losartan.  Advised the message from provider on 9/6 was to reduce metoprolol and keep a BP log for 2 weeks to see changes.  He has not been "keeping up on her BP". Advised to keep a log of BP before and 2 hours after medications and then report these readings.  He is to call if any concerns for too low or too high BP.  He states understanding.

## 2022-11-06 NOTE — Telephone Encounter (Signed)
Pt was seen 9/4 for a med refill for ALPRAZolam Prudy Feeler) 0.25 MG tablet [[id number]] the pharmacy has told them they did not receive this order. She is almost out of her pills she has now.  [facility] [address] [place] - [address] [place] [address] Phone: [phone]  Fax: [phone]

## 2022-11-07 NOTE — Telephone Encounter (Signed)
Rx refills were sent to the patients pharmacy

## 2022-11-13 ENCOUNTER — Telehealth: Payer: Self-pay | Admitting: Family Medicine

## 2022-11-13 NOTE — Telephone Encounter (Signed)
Pt is requesting a copy of her recent labs sent to her home address. [place], [place]           [address] [place] [address] [place] [address]

## 2022-11-14 NOTE — Telephone Encounter (Signed)
Lab results will be mailed out today.

## 2022-11-19 NOTE — Telephone Encounter (Signed)
Pt has not received the lab result report.

## 2022-11-22 NOTE — Telephone Encounter (Signed)
Lab results were mailed out again today on 11/22/22 to the address we have on file.

## 2022-11-23 ENCOUNTER — Telehealth: Payer: Self-pay | Admitting: Cardiology

## 2022-11-23 NOTE — Telephone Encounter (Signed)
Pt c/o medication issue:  1. Name of Medication: losartan (COZAAR) 25 MG tablet   2. How are you currently taking this medication (dosage and times per day)?  Take 1 tablet by mouth once daily       3. Are you having a reaction (difficulty breathing--STAT)? No  4. What is your medication issue? Pt is requesting a callback regarding the medication adjustments. Please advise

## 2022-11-23 NOTE — Telephone Encounter (Signed)
Patient states BP running 100's over 70's. Advised these BOP readings are fine.  If goes below 90/60 she will notify.  She is told multiple times her reading is OK.  Again call if concerned

## 2022-11-27 ENCOUNTER — Other Ambulatory Visit: Payer: Self-pay | Admitting: Family Medicine

## 2022-11-27 DIAGNOSIS — D519 Vitamin B12 deficiency anemia, unspecified: Secondary | ICD-10-CM

## 2022-11-29 ENCOUNTER — Other Ambulatory Visit: Payer: Self-pay | Admitting: Family Medicine

## 2022-11-29 ENCOUNTER — Telehealth: Payer: Self-pay | Admitting: Family Medicine

## 2022-11-29 DIAGNOSIS — E039 Hypothyroidism, unspecified: Secondary | ICD-10-CM

## 2022-11-29 NOTE — Telephone Encounter (Signed)
Pt called and would like to know if you can prescribe a different medication for nerves

## 2022-12-03 ENCOUNTER — Other Ambulatory Visit: Payer: Self-pay | Admitting: Family Medicine

## 2022-12-03 DIAGNOSIS — F418 Other specified anxiety disorders: Secondary | ICD-10-CM

## 2022-12-04 ENCOUNTER — Telehealth: Payer: Self-pay | Admitting: Family Medicine

## 2022-12-04 NOTE — Telephone Encounter (Signed)
Pt said she been feeling funny and different lately and want you to call her

## 2022-12-07 NOTE — Telephone Encounter (Signed)
Notified pt of below. Pt scheduled for 10/17 and not able to come sooner as her son brings her. She is aware no change until appt.

## 2022-12-07 NOTE — Telephone Encounter (Signed)
Called pt to follow up on how she is feeling. Pt said she has a nervous feeling and makes her stomach feel shaky. She is not taking sertraline because it makes her nausea and causes vomiting. She is taking xanax BID. Pt asking if there is another alternative or to increase xanax. Please advise.  [facility], [place] - [address] Phone: [phone]  Fax: [phone]

## 2022-12-13 ENCOUNTER — Ambulatory Visit: Payer: Medicare Other | Admitting: Family Medicine

## 2022-12-13 ENCOUNTER — Encounter: Payer: Self-pay | Admitting: Family Medicine

## 2022-12-13 VITALS — BP 104/68 | HR 104 | Temp 98.3°F | Ht 66.0 in

## 2022-12-13 DIAGNOSIS — L304 Erythema intertrigo: Secondary | ICD-10-CM

## 2022-12-13 DIAGNOSIS — F418 Other specified anxiety disorders: Secondary | ICD-10-CM

## 2022-12-13 HISTORY — DX: Erythema intertrigo: L30.4

## 2022-12-13 MED ORDER — MIRTAZAPINE 7.5 MG PO TABS
7.5000 mg | ORAL_TABLET | Freq: Every day | ORAL | 2 refills | Status: DC
Start: 2022-12-13 — End: 2023-04-30

## 2022-12-13 MED ORDER — NYSTATIN 100000 UNIT/GM EX POWD: 1.0000 | Freq: Three times a day (TID) | CUTANEOUS | 0 refills | Status: DC

## 2022-12-13 NOTE — Progress Notes (Signed)
Established Patient Office Visit   Subjective:  Patient ID: Kaitlyn Rodriguez, female    DOB: 03-28-1933  Age: 89 y.o. MRN: [id number]  Chief Complaint  Patient presents with   Anxiety    Pt states the xanax helps but the zoloft does not work and makes her stomach hurt.     Anxiety     Encounter Diagnoses  Name Primary?   Anxiety with depression Yes   Intertrigo    Developed stomach pain while taking sertraline.  She has a rash underneath her breasts that is responded to nystatin powder in the past   Review of Systems  Constitutional: Negative.   HENT: Negative.    Eyes:  Negative for blurred vision, discharge and redness.  Respiratory: Negative.    Cardiovascular: Negative.   Gastrointestinal:  Negative for abdominal pain.  Genitourinary: Negative.   Musculoskeletal: Negative.  Negative for myalgias.  Skin:  Negative for rash.  Neurological:  Negative for tingling, loss of consciousness and weakness.  Endo/Heme/Allergies:  Negative for polydipsia.      11/01/2022    4:04 PM 04/16/2022    2:51 PM 01/01/2022    2:31 PM  Depression screen PHQ 2/9  Decreased Interest 0 0 0  Down, Depressed, Hopeless 3 0 0  PHQ - 2 Score 3 0 0  Altered sleeping 3    Tired, decreased energy 3    Change in appetite 0    Feeling bad or failure about yourself  3    Trouble concentrating 0    Moving slowly or fidgety/restless 0    Suicidal thoughts 0    PHQ-9 Score 12    Difficult doing work/chores Not difficult at all         Current Outpatient Medications:    mirtazapine (REMERON) 7.5 MG tablet, Take 1 tablet (7.5 mg total) by mouth at bedtime., Disp: 30 tablet, Rfl: 2   nystatin powder, Apply 1 Application topically 3 (three) times daily., Disp: 15 g, Rfl: 0   ALPRAZolam (XANAX) 0.25 MG tablet, Take 1 tablet by mouth twice daily as needed for anxiety, Disp: 60 tablet, Rfl: 0   apixaban (ELIQUIS) 5 MG TABS tablet, Take 1 tablet by mouth twice daily, Disp: 60 tablet, Rfl: 11    cyanocobalamin (VITAMIN B12) 1000 MCG/ML injection, INJECT INTO THE MUSCLE EVERY 30 DAYS, Disp: 3 mL, Rfl: 0   ferrous sulfate (FEROSUL) 325 (65 FE) MG tablet, Take 1 tablet (325 mg total) by mouth daily with breakfast., Disp: 45 tablet, Rfl: 0   loratadine (CLARITIN) 10 MG tablet, Take 1 tablet (10 mg total) by mouth daily., Disp: 30 tablet, Rfl: 11   losartan (COZAAR) 25 MG tablet, Take 1 tablet by mouth once daily, Disp: 90 tablet, Rfl: 1   metolazone (ZAROXOLYN) 5 MG tablet, TAKE 1 TABLET BY MOUTH ONCE A WEEK 30  MINUTES  BEFORE  FIRST  LASIX  DOSE, Disp: 10 tablet, Rfl: 0   metoprolol succinate (TOPROL-XL) 50 MG 24 hr tablet, Take 0.5 tablets (25 mg total) by mouth 2 (two) times daily., Disp: , Rfl:    Multiple Vitamin (MULTIVITAMIN WITH MINERALS) TABS tablet, Take 1 tablet by mouth daily. (Patient not taking: Reported on 11/01/2022), Disp:  , Rfl:    NYSTATIN powder, APPLY 1 POWDER TOPICALLY TWICE DAILY FOR 2 WEEKS, Disp: 15 g, Rfl: 0   SYNTHROID 125 MCG tablet, TAKE 1 TABLET BY MOUTH ONCE DAILY IN THE MORNING . APPOINTMENT REQUIRED FOR FUTURE REFILLS, Disp: 90 tablet,  Rfl: 0   torsemide (DEMADEX) 20 MG tablet, Take 20 mg (1 tablet) daily and take 20 mg (1 tablet) every other night., Disp: 180 tablet, Rfl: 3   triamcinolone ointment (KENALOG) 0.5 %, Apply 1 Application topically 2 (two) times daily., Disp: 30 g, Rfl: 0   Objective:     BP 104/68   Pulse (!) 104   Temp 98.3 F (36.8 C)   Ht 5\' 6"  (1.676 m)   SpO2 94%   BMI 35.19 kg/m    Physical Exam Constitutional:      General: She is not in acute distress.    Appearance: Normal appearance. She is not ill-appearing, toxic-appearing or diaphoretic.  HENT:     Head: Normocephalic and atraumatic.     Right Ear: External ear normal.     Left Ear: External ear normal.  Eyes:     General: No scleral icterus.       Right eye: No discharge.        Left eye: No discharge.     Extraocular Movements: Extraocular movements intact.      Conjunctiva/sclera: Conjunctivae normal.  Pulmonary:     Effort: Pulmonary effort is normal. No respiratory distress.  Skin:    General: Skin is warm and dry.  Neurological:     Mental Status: She is alert and oriented to person, place, and time.  Psychiatric:        Mood and Affect: Mood normal.        Behavior: Behavior normal.      No results found for any visits on 12/13/22.    The ASCVD Risk score (Arnett DK, et al., 2019) failed to calculate for the following reasons:   The 2019 ASCVD risk score is only valid for ages 22 to 44    Assessment & Plan:   Anxiety with depression -     Mirtazapine; Take 1 tablet (7.5 mg total) by mouth at bedtime.  Dispense: 30 tablet; Refill: 2  Intertrigo -     Nystatin; Apply 1 Application topically 3 (three) times daily.  Dispense: 15 g; Refill: 0    Return in about 6 weeks (around 01/24/2023).  Will try Remeron.  Advised to expect daytime drowsiness for 2 to 3 days that should resolve.  Nystatin for intertrigo rash  Mliss Sax, MD

## 2022-12-14 ENCOUNTER — Other Ambulatory Visit: Payer: Self-pay | Admitting: Family Medicine

## 2022-12-14 DIAGNOSIS — E611 Iron deficiency: Secondary | ICD-10-CM

## 2022-12-31 ENCOUNTER — Other Ambulatory Visit: Payer: Self-pay | Admitting: Cardiology

## 2023-01-01 NOTE — Telephone Encounter (Signed)
Pt's pharmacy is requesting a refill on metolazone. Would Dr. Servando Salina like to refill this medication? Please address

## 2023-01-03 ENCOUNTER — Other Ambulatory Visit: Payer: Self-pay | Admitting: Family Medicine

## 2023-01-03 DIAGNOSIS — E039 Hypothyroidism, unspecified: Secondary | ICD-10-CM

## 2023-01-03 DIAGNOSIS — F418 Other specified anxiety disorders: Secondary | ICD-10-CM

## 2023-01-03 NOTE — Telephone Encounter (Signed)
Refill request for  Alprazolam 0.025 mg LR  12/03/22, #60, 0 rf  Synthroid 125 mcg LR  09/04/22, #90, 0 rf LOV  12/13/22 FOV  01/28/23  Please review and advise.  Thanks. Dm/cma

## 2023-01-08 ENCOUNTER — Telehealth: Payer: Self-pay | Admitting: Cardiology

## 2023-01-08 MED ORDER — METOLAZONE 5 MG PO TABS: 5.0000 mg | ORAL_TABLET | ORAL | 2 refills | Status: DC

## 2023-01-08 NOTE — Telephone Encounter (Signed)
*  STAT* If patient is at the pharmacy, call can be transferred to refill team.   1. Which medications need to be refilled? (please list name of each medication and dose if known) metolazone (ZAROXOLYN) 5 MG tablet  2. Which pharmacy/location (including street and city if local pharmacy) is medication to be sent to? [facility] - [place], [place] - [address]  3. Do they need a 30 day or 90 day supply?  90 day supply  Patient's won states patient is completely out of medication.

## 2023-01-08 NOTE — Telephone Encounter (Signed)
Sent RX to requested Pharmacy

## 2023-01-28 ENCOUNTER — Ambulatory Visit: Payer: Medicare Other | Admitting: Family Medicine

## 2023-01-29 ENCOUNTER — Telehealth: Payer: Self-pay | Admitting: Family Medicine

## 2023-01-29 NOTE — Telephone Encounter (Signed)
Conflict/Need to Reschedule (pt cancelled appt through call service, she needs to see cardo before Dr Doreene Burke. she cancelled at 01/27/23)

## 2023-02-04 ENCOUNTER — Other Ambulatory Visit: Payer: Self-pay | Admitting: Family Medicine

## 2023-02-04 ENCOUNTER — Encounter: Payer: Self-pay | Admitting: Cardiology

## 2023-02-04 ENCOUNTER — Ambulatory Visit: Payer: Medicare Other | Attending: Cardiology | Admitting: Cardiology

## 2023-02-04 VITALS — BP 108/68 | HR 108 | Ht 66.0 in | Wt 218.0 lb

## 2023-02-04 DIAGNOSIS — Z79899 Other long term (current) drug therapy: Secondary | ICD-10-CM

## 2023-02-04 DIAGNOSIS — G459 Transient cerebral ischemic attack, unspecified: Secondary | ICD-10-CM

## 2023-02-04 DIAGNOSIS — I4819 Other persistent atrial fibrillation: Secondary | ICD-10-CM

## 2023-02-04 DIAGNOSIS — I7101 Dissection of ascending aorta: Secondary | ICD-10-CM | POA: Diagnosis not present

## 2023-02-04 DIAGNOSIS — I272 Pulmonary hypertension, unspecified: Secondary | ICD-10-CM | POA: Diagnosis not present

## 2023-02-04 DIAGNOSIS — D6869 Other thrombophilia: Secondary | ICD-10-CM

## 2023-02-04 DIAGNOSIS — F418 Other specified anxiety disorders: Secondary | ICD-10-CM

## 2023-02-04 DIAGNOSIS — I5042 Chronic combined systolic (congestive) and diastolic (congestive) heart failure: Secondary | ICD-10-CM | POA: Diagnosis not present

## 2023-02-04 DIAGNOSIS — I340 Nonrheumatic mitral (valve) insufficiency: Secondary | ICD-10-CM

## 2023-02-04 NOTE — Progress Notes (Signed)
Cardiology Office Note:    Date:  02/04/2023   ID:  Kaitlyn Rodriguez, DOB 1933-10-31, MRN [id number]  PCP:  Mliss Sax, MD  Cardiologist:  Thomasene Ripple, DO  Electrophysiologist:  None   Referring MD: Mliss Sax,*   " I am doing well"  History of Present Illness:    Kaitlyn Rodriguez is a 89 y.o. female with a hx of  repair of type a dissection in 2020, permanent atrial fibrillation on anticoagulation with Eliquis 5 mg twice daily, recently diagnosed depressed ejection fraction, history of CVA, presents today for follow-up visit.    At her last visit she was doing well on higher dose of diuretics.  No changes were made.  Since I saw the patient she has had lower blood pressure.  She is here today with her son for a follow up visit.      Past Medical History:  Diagnosis Date   Anxiety disorder, unspecified    Benign essential hypertension    Dissection of unspecified site of aorta (HCC) 11/2018   Heart failure, unspecified (HCC)    History of aortic valve replacement    Hypothyroidism, unspecified    Unspecified osteoarthritis, unspecified site    Vitamin B12 deficiency anemia     Past Surgical History:  Procedure Laterality Date   ABDOMINAL HYSTERECTOMY     at age 35   REPLACEMENT ASCENDING AORTA N/A 12/20/2018   Procedure: REPAIR OF TYPE A AORTIC DISSECTION USING HEMASHIELD PLATINUM AND GRAFT;  Surgeon: Linden Dolin, MD;  Location: [facility] OR;  Service: Open Heart Surgery;  Laterality: N/A;   STERNAL CLOSURE  12/20/2018   Procedure: STERNAL PLATING;  Surgeon: Linden Dolin, MD;  Location: [facility] OR;  Service: Open Heart Surgery;;    Current Medications: Current Meds  Medication Sig   ALPRAZolam (XANAX) 0.25 MG tablet Take 1 tablet by mouth twice daily as needed for anxiety   apixaban (ELIQUIS) 5 MG TABS tablet Take 1 tablet by mouth twice daily   cyanocobalamin (VITAMIN B12) 1000 MCG/ML injection INJECT INTO THE MUSCLE EVERY 30 DAYS    FEROSUL 325 (65 Fe) MG tablet Take 1 tablet by mouth once daily with breakfast   loratadine (CLARITIN) 10 MG tablet Take 1 tablet (10 mg total) by mouth daily.   metolazone (ZAROXOLYN) 5 MG tablet Take 1 tablet (5 mg total) by mouth once a week.   metoprolol succinate (TOPROL-XL) 50 MG 24 hr tablet Take 0.5 tablets (25 mg total) by mouth 2 (two) times daily.   mirtazapine (REMERON) 7.5 MG tablet Take 1 tablet (7.5 mg total) by mouth at bedtime.   Multiple Vitamin (MULTIVITAMIN WITH MINERALS) TABS tablet Take 1 tablet by mouth daily.   NYSTATIN powder APPLY 1 POWDER TOPICALLY TWICE DAILY FOR 2 WEEKS   nystatin powder Apply 1 Application topically 3 (three) times daily.   SYNTHROID 125 MCG tablet TAKE 1 TABLET BY MOUTH ONCE DAILY IN THE MORNING APPOINTMENT REQUIRED FOR FUTURE REFILLS   torsemide (DEMADEX) 20 MG tablet Take 20 mg (1 tablet) daily and take 20 mg (1 tablet) every other night.   triamcinolone ointment (KENALOG) 0.5 % Apply 1 Application topically 2 (two) times daily.   [DISCONTINUED] losartan (COZAAR) 25 MG tablet Take 1 tablet by mouth once daily     Allergies:   Celexa [citalopram] and Sertraline hcl   Social History   Socioeconomic History   Marital status: Widowed    Spouse name: Not on file  Number of children: Not on file   Years of education: Not on file   Highest education level: Not on file  Occupational History   Not on file  Tobacco Use   Smoking status: Never   Smokeless tobacco: Never  Vaping Use   Vaping status: Never Used  Substance and Sexual Activity   Alcohol use: Never   Drug use: Never   Sexual activity: Not Currently  Other Topics Concern   Not on file  Social History Narrative   Not on file   Social Determinants of Health   Financial Resource Strain: Low Risk  (04/16/2022)   Overall Financial Resource Strain (CARDIA)    Difficulty of Paying Living Expenses: Not hard at all  Food Insecurity: No Food Insecurity (04/16/2022)   Hunger Vital  Sign    Worried About Running Out of Food in the Last Year: Never true    Ran Out of Food in the Last Year: Never true  Transportation Needs: No Transportation Needs (04/16/2022)   PRAPARE - Administrator, Civil Service (Medical): No    Lack of Transportation (Non-Medical): No  Physical Activity: Sufficiently Active (04/16/2022)   Exercise Vital Sign    Days of Exercise per Week: 7 days    Minutes of Exercise per Session: 30 min  Stress: No Stress Concern Present (04/16/2022)   [employer] of Occupational Health - Occupational Stress Questionnaire    Feeling of Stress : Not at all  Social Connections: Not on file     Family History: The patient's family history includes Hypertension in her father.  ROS:   Review of Systems  Constitution: Negative for decreased appetite, fever and weight gain.  HENT: Negative for congestion, ear discharge, hoarse voice and sore throat.   Eyes: Negative for discharge, redness, vision loss in right eye and visual halos.  Cardiovascular: Negative for chest pain, dyspnea on exertion, leg swelling, orthopnea and palpitations.  Respiratory: Negative for cough, hemoptysis, shortness of breath and snoring.   Endocrine: Negative for heat intolerance and polyphagia.  Hematologic/Lymphatic: Negative for bleeding problem. Does not bruise/bleed easily.  Skin: Negative for flushing, nail changes, rash and suspicious lesions.  Musculoskeletal: Negative for arthritis, joint pain, muscle cramps, myalgias, neck pain and stiffness.  Gastrointestinal: Negative for abdominal pain, bowel incontinence, diarrhea and excessive appetite.  Genitourinary: Negative for decreased libido, genital sores and incomplete emptying.  Neurological: Negative for brief paralysis, focal weakness, headaches and loss of balance.  Psychiatric/Behavioral: Negative for altered mental status, depression and suicidal ideas.  Allergic/Immunologic: Negative for HIV exposure and  persistent infections.    EKGs/Labs/Other Studies Reviewed:    The following studies were reviewed today:   EKG: None today  Echo 12/04/2020 IMPRESSIONS   1. Left ventricular ejection fraction, by estimation, is 35 to 40%. The left ventricle has moderately decreased function. The left ventricle has  no regional wall motion abnormalities. Left ventricular diastolic parameters are indeterminate.   2. Right ventricular systolic function is moderately reduced. The right ventricular size is mildly enlarged. There is mildly elevated pulmonary artery systolic pressure. The estimated right ventricular systolic pressure is 36.5 mmHg.   3. Left atrial size was severely dilated.   4. Right atrial size was severely dilated.   5. The mitral valve is normal in structure. Severe mitral valve  regurgitation. No evidence of mitral stenosis.   6. Tricuspid valve regurgitation is moderate.   7. The aortic valve is calcified. Aortic valve regurgitation is mild.  Mild to  moderate aortic valve sclerosis/calcification is present, without  any evidence of aortic stenosis.   8. Pulmonic valve regurgitation is moderate.   9. Aortic dilatation noted and root/ascending aorta has been  repaired/replaced. There is mild dilatation of the ascending aorta,  measuring 40 mm.  10. The inferior vena cava is normal in size with greater than 50%  respiratory variability, suggesting right atrial pressure of 3 mmHg.   FINDINGS   Left Ventricle: Left ventricular ejection fraction, by estimation, is 35  to 40%. The left ventricle has moderately decreased function. The left  ventricle has no regional wall motion abnormalities. The left ventricular  internal cavity size was normal in  size. There is no left ventricular hypertrophy. Left ventricular diastolic  parameters are indeterminate.      LV Wall Scoring:  The mid and distal inferior wall and mid inferolateral segment are  akinetic.   Right Ventricle: The right  ventricular size is mildly enlarged. No  increase in right ventricular wall thickness. Right ventricular systolic  function is moderately reduced. There is mildly elevated pulmonary artery  systolic pressure. The tricuspid  regurgitant velocity is 2.32 m/s, and with an assumed right atrial  pressure of 15 mmHg, the estimated right ventricular systolic pressure is  36.5 mmHg.   Left Atrium: Left atrial size was severely dilated.   Right Atrium: Right atrial size was severely dilated.   Pericardium: There is no evidence of pericardial effusion.   Mitral Valve: The mitral valve is normal in structure. Severe mitral valve  regurgitation, with posteriorly-directed jet. No evidence of mitral valve  stenosis.   Tricuspid Valve: The tricuspid valve is normal in structure. Tricuspid  valve regurgitation is moderate . No evidence of tricuspid stenosis.   Aortic Valve: The aortic valve is calcified. Aortic valve regurgitation is  mild. Mild to moderate aortic valve sclerosis/calcification is present,  without any evidence of aortic stenosis.   Pulmonic Valve: The pulmonic valve was normal in structure. Pulmonic valve  regurgitation is moderate. No evidence of pulmonic stenosis.   Aorta: Aortic dilatation noted and the aortic root/ascending aorta has  been repaired/replaced. There is mild dilatation of the ascending aorta,  measuring 40 mm.   Venous: The inferior vena cava is normal in size with greater than 50%  respiratory variability, suggesting right atrial pressure of 3 mmHg.   IAS/Shunts: No atrial level shunt detected by color flow Doppler  Recent Labs: 04/17/2022: ALT 8 07/31/2022: Magnesium 1.9 09/12/2022: BUN 33; Creatinine, Ser 1.09; Potassium 3.9; Sodium 130 11/01/2022: Hemoglobin 13.4; Platelets 217.0; TSH 1.54  Recent Lipid Panel    Component Value Date/Time   CHOL 154 12/04/2020 0422   TRIG 72 12/04/2020 0422   HDL 46 12/04/2020 0422   CHOLHDL 3.3 12/04/2020 0422   VLDL  14 12/04/2020 0422   LDLCALC 94 12/04/2020 0422    Physical Exam:    VS:  BP 108/68 (BP Location: Right Arm, Patient Position: Sitting, Cuff Size: Normal)   Pulse (!) 108   Ht 5\' 6"  (1.676 m)   Wt 218 lb (98.9 kg)   SpO2 93%   BMI 35.19 kg/m     Wt Readings from Last 3 Encounters:  02/04/23 218 lb (98.9 kg)  07/31/22 218 lb (98.9 kg)  04/17/22 218 lb (98.9 kg)     GEN: Well nourished, well developed in no acute distress HEENT: Normal NECK: No JVD; No carotid bruits LYMPHATICS: No lymphadenopathy CARDIAC: S1S2 noted,RRR, no murmurs, rubs, gallops RESPIRATORY:  Clear to auscultation  without rales, wheezing or rhonchi  ABDOMEN: Soft, non-tender, non-distended, +bowel sounds, no guarding. EXTREMITIES: +1 bilaterally extremity edema, No cyanosis, no clubbing MUSCULOSKELETAL:  No deformity  SKIN: Warm and dry NEUROLOGIC:  Alert and oriented x 3, non-focal PSYCHIATRIC:  Normal affect, good insight  ASSESSMENT:    1. Medication management   2. Persistent atrial fibrillation (HCC)   3. Chronic combined systolic and diastolic congestive heart failure (HCC)   4. Nonrheumatic mitral valve regurgitation   5. Hypercoagulable state due to persistent atrial fibrillation (HCC)   6. Dissecting aneurysm of thoracic aorta, Stanford type A (HCC)   7. Pulmonary hypertension (HCC)   8. TIA (transient ischemic attack)     PLAN:    She has had great improvement on her current dose of duiretics. Will continue. She is euvolemic in the office today.   Her blood pressure systolic blood pressure is on the lower side we will stop the losartan.  Will get blood work today.   The patient is in agreement with the above plan. The patient eft the office in stable condition.  The patient will follow up in 6 months or sooner if needed.   Medication Adjustments/Labs and Tests Ordered: Current medicines are reviewed at length with the patient today.  Concerns regarding medicines are outlined above.   Orders Placed This Encounter  Procedures   Comprehensive Metabolic Panel (CMET)   Magnesium   No orders of the defined types were placed in this encounter.   Patient Instructions  Medication Instructions:  Your physician has recommended you make the following change in your medication:  STOP: Losartan  *If you need a refill on your cardiac medications before your next appointment, please call your pharmacy*   Lab Work: CMET, Mag If you have labs (blood work) drawn today and your tests are completely normal, you will receive your results only by: MyChart Message (if you have MyChart) OR A paper copy in the mail If you have any lab test that is abnormal or we need to change your treatment, we will call you to review the results.   Follow-Up: At [facility], you and your health needs are our priority.  As part of our continuing mission to provide you with exceptional heart care, we have created designated Provider Care Teams.  These Care Teams include your primary Cardiologist (physician) and Advanced Practice Providers (APPs -  Physician Assistants and Nurse Practitioners) who all work together to provide you with the care you need, when you need it.   Your next appointment:   6 month(s)  Provider:   Thomasene Ripple, DO     Adopting a Healthy Lifestyle.  Know what a healthy weight is for you (roughly BMI <25) and aim to maintain this   Aim for 7+ servings of fruits and vegetables daily   65-80+ fluid ounces of water or unsweet tea for healthy kidneys   Limit to max 1 drink of alcohol per day; avoid smoking/tobacco   Limit animal fats in diet for cholesterol and heart health - choose grass fed whenever available   Avoid highly processed foods, and foods high in saturated/trans fats   Aim for low stress - take time to unwind and care for your mental health   Aim for 150 min of moderate intensity exercise weekly for heart health, and weights twice weekly for bone  health   Aim for 7-9 hours of sleep daily   When it comes to diets, agreement about the perfect plan  isnt easy to find, even among the experts. Experts at the [facility] of [employer] developed an idea known as the Healthy Eating Plate. Just imagine a plate divided into logical, healthy portions.   The emphasis is on diet quality:   Load up on vegetables and fruits - one-half of your plate: Aim for color and variety, and remember that potatoes dont count.   Go for whole grains - one-quarter of your plate: Whole wheat, barley, wheat berries, quinoa, oats, brown rice, and foods made with them. If you want pasta, go with whole wheat pasta.   Protein power - one-quarter of your plate: Fish, chicken, beans, and nuts are all healthy, versatile protein sources. Limit red meat.   The diet, however, does go beyond the plate, offering a few other suggestions.   Use healthy plant oils, such as olive, canola, soy, corn, sunflower and peanut. Check the labels, and avoid partially hydrogenated oil, which have unhealthy trans fats.   If youre thirsty, drink water. Coffee and tea are good in moderation, but skip sugary drinks and limit milk and dairy products to one or two daily servings.   The type of carbohydrate in the diet is more important than the amount. Some sources of carbohydrates, such as vegetables, fruits, whole grains, and beans-are healthier than others.   Finally, stay active  Signed, Thomasene Ripple, DO  02/04/2023 3:16 PM    [facility]

## 2023-02-04 NOTE — Patient Instructions (Signed)
Medication Instructions:  Your physician has recommended you make the following change in your medication:  STOP: Losartan  *If you need a refill on your cardiac medications before your next appointment, please call your pharmacy*   Lab Work: CMET, Mag If you have labs (blood work) drawn today and your tests are completely normal, you will receive your results only by: MyChart Message (if you have MyChart) OR A paper copy in the mail If you have any lab test that is abnormal or we need to change your treatment, we will call you to review the results.   Follow-Up: At [facility], you and your health needs are our priority.  As part of our continuing mission to provide you with exceptional heart care, we have created designated Provider Care Teams.  These Care Teams include your primary Cardiologist (physician) and Advanced Practice Providers (APPs -  Physician Assistants and Nurse Practitioners) who all work together to provide you with the care you need, when you need it.   Your next appointment:   6 month(s)  Provider:   Thomasene Ripple, DO

## 2023-02-05 LAB — COMPREHENSIVE METABOLIC PANEL
ALT: 12 [IU]/L (ref 0–32)
AST: 21 [IU]/L (ref 0–40)
Albumin: 4 g/dL (ref 3.7–4.7)
Alkaline Phosphatase: 72 [IU]/L (ref 44–121)
BUN/Creatinine Ratio: 26 (ref 12–28)
BUN: 29 mg/dL — ABNORMAL HIGH (ref 8–27)
Bilirubin Total: 0.4 mg/dL (ref 0.0–1.2)
CO2: 30 mmol/L — ABNORMAL HIGH (ref 20–29)
Calcium: 9.3 mg/dL (ref 8.7–10.3)
Chloride: 92 mmol/L — ABNORMAL LOW (ref 96–106)
Creatinine, Ser: 1.13 mg/dL — ABNORMAL HIGH (ref 0.57–1.00)
Globulin, Total: 3.1 g/dL (ref 1.5–4.5)
Glucose: 91 mg/dL (ref 70–99)
Potassium: 4 mmol/L (ref 3.5–5.2)
Sodium: 139 mmol/L (ref 134–144)
Total Protein: 7.1 g/dL (ref 6.0–8.5)
eGFR: 47 mL/min/{1.73_m2} — ABNORMAL LOW (ref >59)

## 2023-02-05 LAB — MAGNESIUM: Magnesium: 1.8 mg/dL (ref 1.6–2.3)

## 2023-02-05 NOTE — Telephone Encounter (Signed)
LR 01/03/23, #60, 0 rf LOV  12/13/22 FOV  none scheduled.   Please review and advise.  Thanks. Dm/cma

## 2023-02-22 ENCOUNTER — Other Ambulatory Visit: Payer: Self-pay | Admitting: Family Medicine

## 2023-02-22 DIAGNOSIS — D519 Vitamin B12 deficiency anemia, unspecified: Secondary | ICD-10-CM

## 2023-03-05 ENCOUNTER — Other Ambulatory Visit: Payer: Self-pay | Admitting: Family Medicine

## 2023-03-05 DIAGNOSIS — F418 Other specified anxiety disorders: Secondary | ICD-10-CM

## 2023-03-07 ENCOUNTER — Other Ambulatory Visit: Payer: Self-pay | Admitting: Family Medicine

## 2023-03-07 DIAGNOSIS — F418 Other specified anxiety disorders: Secondary | ICD-10-CM

## 2023-04-02 ENCOUNTER — Other Ambulatory Visit: Payer: Self-pay | Admitting: Cardiology

## 2023-04-07 ENCOUNTER — Other Ambulatory Visit: Payer: Self-pay | Admitting: Family Medicine

## 2023-04-07 DIAGNOSIS — F418 Other specified anxiety disorders: Secondary | ICD-10-CM

## 2023-04-08 ENCOUNTER — Other Ambulatory Visit: Payer: Self-pay | Admitting: Family Medicine

## 2023-04-08 DIAGNOSIS — F418 Other specified anxiety disorders: Secondary | ICD-10-CM

## 2023-04-11 ENCOUNTER — Telehealth: Payer: Self-pay | Admitting: Family Medicine

## 2023-04-11 NOTE — Telephone Encounter (Unsigned)
Copied from CRM [id number]. Topic: Clinical - Medication Refill >> Apr 11, 2023  1:44 PM Tiffany H wrote: Most Recent Primary Care Visit:  Provider: Mliss Sax  Department: [facility]  Visit Type: OFFICE VISIT  Date: 12/13/2022  Medication: ALPRAZolam (XANAX) 0.25 MG tablet  Has the patient contacted their pharmacy? Yes (Agent: If no, request that the patient contact the pharmacy for the refill. If patient does not wish to contact the pharmacy document the reason why and proceed with request.) (Agent: If yes, when and what did the pharmacy advise?)  Is this the correct pharmacy for this prescription? Yes If no, delete pharmacy and type the correct one.  This is the patient's preferred pharmacy:  [facility] [address] [place] - [address] [facility] [place] [address] Phone: [phone] Fax: [phone]  [facility] [address] [place] [address] Phone: [phone] Fax: [phone]  [facility] [place], [place] - [address] [place] [address] Phone: [phone] Fax: [phone]  [facility] [address], [place] - [address] [place] [address] Phone: [phone] Fax: [phone]  [id number] [place], [place] - [address] [place] [address] Phone: [phone] Fax: [phone]   Has the prescription been filled recently? No  Is the patient out of the medication? No  Has the patient been seen for an appointment in the last year OR does the patient have an upcoming appointment? Yes  Can we respond through MyChart? No  Agent: Please be advised that Rx refills may take up to 3 business days. We ask that you follow-up with your pharmacy.

## 2023-04-12 ENCOUNTER — Telehealth: Payer: Self-pay

## 2023-04-12 ENCOUNTER — Other Ambulatory Visit: Payer: Self-pay | Admitting: Family Medicine

## 2023-04-12 DIAGNOSIS — F418 Other specified anxiety disorders: Secondary | ICD-10-CM

## 2023-04-12 NOTE — Telephone Encounter (Signed)
Copied from CRM [id number]. Topic: Clinical - Prescription Issue >> Apr 12, 2023 12:11 PM Gurney Maxin H wrote: Reason for CRM: Patient had a refill request in for ALPRAZolam Prudy Feeler) 0.25 MG tablet on 2/9 that states she needs an appointment, patient has an appointment scheduled needs some to hold her over until she comes in. Thanks

## 2023-04-12 NOTE — Telephone Encounter (Signed)
Pt is requesting a refill of her Xanax to get through to her next appointment on 04/30/23. Pt frustrated as she states she needs this medication and is concerned she will be "sick" without it, pt is currently asymptomatic. This RN educated pt on controlled substance prescriptions and the restrictions.  This RN educated pt on home care, new-worsening symptoms, when to call back/seek emergent care. Pt verbalized understanding and agrees to plan.   Copied from CRM [id number]. Topic: Clinical - Prescription Issue >> Apr 12, 2023  4:57 PM Denese Killings wrote: Reason for CRM: Maisie Fus calling to check on prescription for [facility]. He stated that patient has one left. Patient wants to know if she can have a few until her appointment.He does not want her to stop taking medication suddenly and Maisie Fus is concerned. Maisie Fus is requesting a callback.

## 2023-04-15 MED ORDER — ALPRAZOLAM 0.25 MG PO TABS: 0.2500 mg | ORAL_TABLET | Freq: Two times a day (BID) | ORAL | 0 refills | Status: DC | PRN

## 2023-04-15 NOTE — Addendum Note (Signed)
Addended by: Andrez Grime on: 04/15/2023 07:56 AM   Modules accepted: Orders

## 2023-04-16 ENCOUNTER — Ambulatory Visit: Payer: Medicare Other | Admitting: Family Medicine

## 2023-04-19 ENCOUNTER — Ambulatory Visit: Payer: Medicare Other

## 2023-04-19 ENCOUNTER — Other Ambulatory Visit: Payer: Self-pay | Admitting: Cardiology

## 2023-04-19 DIAGNOSIS — Z0000 Encounter for general adult medical examination without abnormal findings: Secondary | ICD-10-CM | POA: Diagnosis not present

## 2023-04-19 NOTE — Progress Notes (Signed)
Subjective:   Kaitlyn Rodriguez is a 89 y.o. who presents for a Medicare Wellness preventive visit.  Visit Complete: Virtual I connected with  Barrett Henle on 04/19/23 by a audio enabled telemedicine application and verified that I am speaking with the correct person using two identifiers.  Patient Location: Home  Provider Location: Home Office  I discussed the limitations of evaluation and management by telemedicine. The patient expressed understanding and agreed to proceed.  Vital Signs: Because this visit was a virtual/telehealth visit, some criteria may be missing or patient reported. Any vitals not documented were not able to be obtained and vitals that have been documented are patient reported.  VideoError- Librarian, academic were attempted between this provider and patient, however failed, due to patient having technical difficulties OR patient did not have access to video capability.  We continued and completed visit with audio only.   AWV Questionnaire: No: Patient Medicare AWV questionnaire was not completed prior to this visit.  Cardiac Risk Factors include: advanced age (>31men, >53 women)     Objective:    Today's Vitals   There is no height or weight on file to calculate BMI.     04/19/2023    2:26 PM 04/16/2022    2:50 PM 12/06/2020    1:06 PM 12/23/2018    8:54 PM 12/20/2018    5:17 PM  Advanced Directives  Does Patient Have a Medical Advance Directive? Yes Yes No No No  Type of Estate agent of [place];Living will Healthcare Power of [place];Living will     Copy of Healthcare Power of Attorney in Chart? No - copy requested No - copy requested     Would patient like information on creating a medical advance directive?   No - Patient declined Yes (Inpatient - patient requests chaplain consult to create a medical advance directive)     Current Medications (verified) Outpatient Encounter Medications as of  04/19/2023  Medication Sig   ALPRAZolam (XANAX) 0.25 MG tablet Take 1 tablet (0.25 mg total) by mouth 2 (two) times daily as needed. for anxiety   apixaban (ELIQUIS) 5 MG TABS tablet Take 1 tablet by mouth twice daily   cyanocobalamin (VITAMIN B12) 1000 MCG/ML injection INJECT INTO THE MUSCLE EVERY 30 DAYS   FEROSUL 325 (65 Fe) MG tablet Take 1 tablet by mouth once daily with breakfast   loratadine (CLARITIN) 10 MG tablet Take 1 tablet (10 mg total) by mouth daily.   metolazone (ZAROXOLYN) 5 MG tablet Take 1 tablet (5 mg total) by mouth once a week.   metoprolol succinate (TOPROL-XL) 50 MG 24 hr tablet Take 0.5 tablets (25 mg total) by mouth 2 (two) times daily.   Multiple Vitamin (MULTIVITAMIN WITH MINERALS) TABS tablet Take 1 tablet by mouth daily.   SYNTHROID 125 MCG tablet TAKE 1 TABLET BY MOUTH ONCE DAILY IN THE MORNING APPOINTMENT REQUIRED FOR FUTURE REFILLS   torsemide (DEMADEX) 20 MG tablet TAKE 1 TABLET BY MOUTH ONCE DAILY AND TAKE 1 TABLET EVERY OTHER NIGHT   triamcinolone ointment (KENALOG) 0.5 % Apply 1 Application topically 2 (two) times daily.   mirtazapine (REMERON) 7.5 MG tablet Take 1 tablet (7.5 mg total) by mouth at bedtime. (Patient not taking: Reported on 04/19/2023)   NYSTATIN powder APPLY 1 POWDER TOPICALLY TWICE DAILY FOR 2 WEEKS (Patient not taking: Reported on 04/19/2023)   nystatin powder Apply 1 Application topically 3 (three) times daily. (Patient not taking: Reported on 04/19/2023)  sertraline (ZOLOFT) 25 MG tablet Take 1 tablet by mouth once daily (Patient not taking: Reported on 04/19/2023)   No facility-administered encounter medications on file as of 04/19/2023.    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   Heart failure, unspecified (HCC)    History of aortic valve  replacement    Hypothyroidism, unspecified    Unspecified osteoarthritis, unspecified site    Vitamin B12 deficiency anemia    Past Surgical History:  Procedure Laterality Date   ABDOMINAL HYSTERECTOMY     at age 47   REPLACEMENT ASCENDING AORTA N/A 12/20/2018   Procedure: REPAIR OF TYPE A AORTIC DISSECTION USING HEMASHIELD PLATINUM AND GRAFT;  Surgeon: Linden Dolin, MD;  Location: [facility] OR;  Service: Open Heart Surgery;  Laterality: N/A;   STERNAL CLOSURE  12/20/2018   Procedure: STERNAL PLATING;  Surgeon: Linden Dolin, MD;  Location: [facility] OR;  Service: Open Heart Surgery;;   Family History  Problem Relation Age of Onset   Hypertension Father    Social History   Socioeconomic History   Marital status: Widowed    Spouse name: Not on file   Number of children: Not on file   Years of education: Not on file   Highest education level: Not on file  Occupational History   Not on file  Tobacco Use   Smoking status: Never   Smokeless tobacco: Never  Vaping Use   Vaping status: Never Used  Substance and Sexual Activity   Alcohol use: Never   Drug use: Never   Sexual activity: Not Currently  Other Topics Concern   Not on file  Social History Narrative   Not on file   Social Drivers of Health   Financial Resource Strain: Low Risk  (04/19/2023)   Overall Financial Resource Strain (CARDIA)    Difficulty of Paying Living Expenses: Not hard at all  Food Insecurity: No Food Insecurity (04/19/2023)   Hunger Vital Sign    Worried About Running Out of Food in the Last Year: Never true    Ran Out of Food in the Last Year: Never true  Transportation Needs: No Transportation Needs (04/19/2023)   PRAPARE - Administrator, Civil Service (Medical): No    Lack of Transportation (Non-Medical): No  Physical Activity: Insufficiently Active (04/19/2023)   Exercise Vital Sign    Days of Exercise per Week: 7 days    Minutes of Exercise per Session: 20 min  Stress:  Stress Concern Present (04/19/2023)   [employer] of Occupational Health - Occupational Stress Questionnaire    Feeling of Stress : To some extent  Social Connections: Unknown (04/19/2023)   Social Connection and Isolation Panel [NHANES]    Frequency of Communication with Friends and Family: Twice a week    Frequency of Social Gatherings with Friends and Family: Not on file    Attends Religious Services: Never    Database administrator or Organizations: No    Attends Banker Meetings: Never    Marital Status: Widowed    Tobacco Counseling Counseling given: Not Answered    Clinical Intake:  Pre-visit preparation completed: Yes  Pain : No/denies pain     Nutritional Risks: None Diabetes: No  How often do you need to have someone help you when you  read instructions, pamphlets, or other written materials from your doctor or pharmacy?: 1 - Never  Interpreter Needed?: No  Information entered by :: NAllen LPN   Activities of Daily Living     04/19/2023    2:17 PM  In your present state of health, do you have any difficulty performing the following activities:  Hearing? 0  Vision? 0  Difficulty concentrating or making decisions? 1  Comment trouble with names  Walking or climbing stairs? 1  Dressing or bathing? 0  Doing errands, shopping? 1  Preparing Food and eating ? Y  Comment son manages  Using the Toilet? N  In the past six months, have you accidently leaked urine? N  Do you have problems with loss of bowel control? N  Managing your Medications? N  Managing your Finances? N  Housekeeping or managing your Housekeeping? N    Patient Care Team: Mliss Sax, MD as PCP - General (Family Medicine) Thomasene Ripple, DO as PCP - Cardiology (Cardiology)  Indicate any recent Medical Services you may have received from other than [facility] providers in the past year (date may be approximate).     Assessment:   This is a routine wellness examination  for [place].  Hearing/Vision screen Hearing Screening - Comments:: Denies hearing issues Vision Screening - Comments:: No regular eye exams   Goals Addressed             This Visit's Progress    Patient Stated       04/18/2022, wants to lose weight       Depression Screen     04/19/2023    2:27 PM 11/01/2022    4:04 PM 04/16/2022    2:51 PM 01/01/2022    2:31 PM 08/14/2021    3:39 PM 07/03/2021    4:54 PM 07/03/2021    4:25 PM  PHQ 2/9 Scores  PHQ - 2 Score 0 3 0 0 4 2 2   PHQ- 9 Score  12    5     Fall Risk     04/19/2023    2:27 PM 04/16/2022    2:50 PM 08/14/2021    3:39 PM 07/03/2021    4:25 PM 01/16/2021    3:12 PM  Fall Risk   Falls in the past year? 0 0 0 0 0  Number falls in past yr: 0 0 1 0 0  Injury with Fall? 0 0     Risk for fall due to : Medication side effect;Impaired mobility;Impaired balance/gait Medication side effect     Follow up Falls prevention discussed;Falls evaluation completed Falls prevention discussed;Education provided;Falls evaluation completed       MEDICARE RISK AT HOME:  Medicare Risk at Home Any stairs in or around the home?: Yes If so, are there any without handrails?: Yes Home free of loose throw rugs in walkways, pet beds, electrical cords, etc?: Yes Adequate lighting in your home to reduce risk of falls?: Yes Life alert?: No Use of a cane, walker or w/c?: Yes Grab bars in the bathroom?: No Shower chair or bench in shower?: No Elevated toilet seat or a handicapped toilet?: No  TIMED UP AND GO:  Was the test performed?  No  Cognitive Function: 6CIT completed        04/19/2023    2:28 PM 04/16/2022    2:53 PM  6CIT Screen  What Year? 0 points 0 points  What month? 0 points 0 points  What time? 0 points 0 points  Count back from 20 0 points 0 points  Months in reverse 4 points 0 points  Repeat phrase 10 points 0 points  Total Score 14 points 0 points    Immunizations Immunization History  Administered Date(s) Administered    Influenza, High Dose Seasonal PF 01/01/2022    Screening Tests Health Maintenance  Topic Date Due   Pneumonia Vaccine 53+ Years old (1 of 2 - PCV) Never done   DTaP/Tdap/Td (1 - Tdap) Never done   DEXA SCAN  Never done   INFLUENZA VACCINE  05/27/2023 (Originally 09/27/2022)   HPV VACCINES  Aged Out   COVID-19 Vaccine  Discontinued   Zoster Vaccines- Shingrix  Discontinued    Health Maintenance  Health Maintenance Due  Topic Date Due   Pneumonia Vaccine 109+ Years old (1 of 2 - PCV) Never done   DTaP/Tdap/Td (1 - Tdap) Never done   DEXA SCAN  Never done   Health Maintenance Items Addressed: Declines all vaccines, declines DEXA  Additional Screening:  Vision Screening: Recommended annual ophthalmology exams for early detection of glaucoma and other disorders of the eye.  Dental Screening: Recommended annual dental exams for proper oral hygiene  Community Resource Referral / Chronic Care Management: CRR required this visit?  No   CCM required this visit?  No     Plan:     I have personally reviewed and noted the following in the patient's chart:   Medical and social history Use of alcohol, tobacco or illicit drugs  Current medications and supplements including opioid prescriptions. Patient is not currently taking opioid prescriptions. Functional ability and status Nutritional status Physical activity Advanced directives List of other physicians Hospitalizations, surgeries, and ER visits in previous 12 months Vitals Screenings to include cognitive, depression, and falls Referrals and appointments  In addition, I have reviewed and discussed with patient certain preventive protocols, quality metrics, and best practice recommendations. A written personalized care plan for preventive services as well as general preventive health recommendations were provided to patient.     Barb Merino, LPN   [id number]   After Visit Summary: (Pick Up) Due to this being a  telephonic visit, with patients personalized plan was offered to patient and patient has requested to Pick up at office.  Notes: Please refer to Routing Comments.

## 2023-04-19 NOTE — Patient Instructions (Signed)
Kaitlyn Rodriguez , Thank you for taking time to come for your Medicare Wellness Visit. I appreciate your ongoing commitment to your health goals. Please review the following plan we discussed and let me know if I can assist you in the future.   Referrals/Orders/Follow-Ups/Clinician Recommendations: none  This is a list of the screening recommended for you and due dates:  Health Maintenance  Topic Date Due   Pneumonia Vaccine (1 of 2 - PCV) Never done   DTaP/Tdap/Td vaccine (1 - Tdap) Never done   DEXA scan (bone density measurement)  Never done   Flu Shot  05/27/2023*   HPV Vaccine  Aged Out   COVID-19 Vaccine  Discontinued   Zoster (Shingles) Vaccine  Discontinued  *Topic was postponed. The date shown is not the original due date.    Advanced directives: (Copy Requested) Please bring a copy of your health care power of attorney and living will to the office to be added to your chart at your convenience.  Next Medicare Annual Wellness Visit scheduled for next year: Yes  insert Preventive Care attachment Insert FALL PREVENTION attachment if needed

## 2023-04-30 ENCOUNTER — Ambulatory Visit: Payer: Medicare Other | Admitting: Family Medicine

## 2023-04-30 ENCOUNTER — Encounter: Payer: Self-pay | Admitting: Family Medicine

## 2023-04-30 DIAGNOSIS — F418 Other specified anxiety disorders: Secondary | ICD-10-CM | POA: Diagnosis not present

## 2023-04-30 MED ORDER — MIRTAZAPINE 7.5 MG PO TABS: 7.5000 mg | ORAL_TABLET | Freq: Every day | ORAL | 1 refills | Status: DC

## 2023-04-30 NOTE — Progress Notes (Addendum)
 Established Patient Office Visit   Subjective:  Patient ID: Kaitlyn Rodriguez, female    DOB: 1933-03-01  Age: 89 y.o. MRN: [id number]  No chief complaint on file.   HPI Encounter Diagnoses  Name Primary?   Anxiety with depression    Ongoing issues with anxiety and depression.  Continues with alprazolam 0.25 twice daily.  Never started the mirtazapine that was called in last time because she had issues taking sertraline and citalopram.   Review of Systems  Constitutional: Negative.   HENT: Negative.    Eyes:  Negative for blurred vision, discharge and redness.  Respiratory: Negative.    Cardiovascular: Negative.   Gastrointestinal:  Negative for abdominal pain.  Genitourinary: Negative.   Musculoskeletal: Negative.  Negative for myalgias.  Skin:  Negative for rash.  Neurological:  Negative for tingling, loss of consciousness and weakness.  Endo/Heme/Allergies:  Negative for polydipsia.  Psychiatric/Behavioral:  Positive for depression. The patient is nervous/anxious.      Current Outpatient Medications:    ALPRAZolam (XANAX) 0.25 MG tablet, Take 1 tablet (0.25 mg total) by mouth 2 (two) times daily as needed. for anxiety, Disp: 60 tablet, Rfl: 0   apixaban (ELIQUIS) 5 MG TABS tablet, Take 1 tablet by mouth twice daily, Disp: 60 tablet, Rfl: 11   cyanocobalamin (VITAMIN B12) 1000 MCG/ML injection, INJECT INTO THE MUSCLE EVERY 30 DAYS, Disp: 3 mL, Rfl: 0   FEROSUL 325 (65 Fe) MG tablet, Take 1 tablet by mouth once daily with breakfast, Disp: 45 tablet, Rfl: 0   loratadine (CLARITIN) 10 MG tablet, Take 1 tablet (10 mg total) by mouth daily., Disp: 30 tablet, Rfl: 11   metolazone (ZAROXOLYN) 5 MG tablet, Take 1 tablet (5 mg total) by mouth once a week., Disp: 12 tablet, Rfl: 2   metoprolol succinate (TOPROL-XL) 50 MG 24 hr tablet, Take 0.5 tablets (25 mg total) by mouth 2 (two) times daily., Disp: , Rfl:    mirtazapine (REMERON) 7.5 MG tablet, Take 1 tablet (7.5 mg total) by  mouth at bedtime., Disp: 30 tablet, Rfl: 1   Multiple Vitamin (MULTIVITAMIN WITH MINERALS) TABS tablet, Take 1 tablet by mouth daily., Disp:  , Rfl:    SYNTHROID 125 MCG tablet, TAKE 1 TABLET BY MOUTH ONCE DAILY IN THE MORNING APPOINTMENT REQUIRED FOR FUTURE REFILLS, Disp: 90 tablet, Rfl: 3   torsemide (DEMADEX) 20 MG tablet, TAKE 1 TABLET BY MOUTH ONCE DAILY AND TAKE 1 TABLET EVERY OTHER NIGHT, Disp: 135 tablet, Rfl: 3   triamcinolone ointment (KENALOG) 0.5 %, Apply 1 Application topically 2 (two) times daily., Disp: 30 g, Rfl: 0   NYSTATIN powder, APPLY 1 POWDER TOPICALLY TWICE DAILY FOR 2 WEEKS (Patient not taking: Reported on 04/30/2023), Disp: 15 g, Rfl: 0   nystatin powder, Apply 1 Application topically 3 (three) times daily. (Patient not taking: Reported on 04/30/2023), Disp: 15 g, Rfl: 0   Objective:     BP 122/76   Pulse 70   Temp 97.7 F (36.5 C)   Ht 5\' 6"  (1.676 m)   SpO2 97%   BMI 35.19 kg/m    Physical Exam Constitutional:      General: She is not in acute distress.    Appearance: Normal appearance. She is not ill-appearing, toxic-appearing or diaphoretic.  HENT:     Head: Normocephalic and atraumatic.     Right Ear: External ear normal.     Left Ear: External ear normal.  Eyes:     General: No scleral  icterus.       Right eye: No discharge.        Left eye: No discharge.     Extraocular Movements: Extraocular movements intact.     Conjunctiva/sclera: Conjunctivae normal.  Cardiovascular:     Rate and Rhythm: Normal rate and regular rhythm.  Pulmonary:     Effort: Pulmonary effort is normal. No respiratory distress.     Breath sounds: No wheezing, rhonchi or rales.  Skin:    General: Skin is warm and dry.  Neurological:     Mental Status: She is alert and oriented to person, place, and time.  Psychiatric:        Mood and Affect: Mood normal.        Behavior: Behavior normal.      No results found for any visits on 04/30/23.    The ASCVD Risk score  (Arnett DK, et al., 2019) failed to calculate for the following reasons:   The 2019 ASCVD risk score is only valid for ages 51 to 62    Assessment & Plan:   Anxiety with depression -     Mirtazapine; Take 1 tablet (7.5 mg total) by mouth at bedtime.  Dispense: 30 tablet; Refill: 1    Return in about 6 weeks (around 06/11/2023).  This time that we will go ahead and start the mirtazapine.  Warned that there will be daytime drowsiness for the first 2 or 3 days.   Mliss Sax, MD

## 2023-05-09 ENCOUNTER — Other Ambulatory Visit: Payer: Self-pay | Admitting: Family Medicine

## 2023-05-09 DIAGNOSIS — F418 Other specified anxiety disorders: Secondary | ICD-10-CM

## 2023-05-09 DIAGNOSIS — E611 Iron deficiency: Secondary | ICD-10-CM

## 2023-05-13 ENCOUNTER — Other Ambulatory Visit: Payer: Self-pay

## 2023-05-13 DIAGNOSIS — E611 Iron deficiency: Secondary | ICD-10-CM

## 2023-05-13 MED ORDER — FERROUS SULFATE 325 (65 FE) MG PO TABS: 325.0000 mg | ORAL_TABLET | Freq: Every day | ORAL | 0 refills | Status: AC

## 2023-05-20 ENCOUNTER — Other Ambulatory Visit: Payer: Self-pay | Admitting: Family Medicine

## 2023-05-20 DIAGNOSIS — D519 Vitamin B12 deficiency anemia, unspecified: Secondary | ICD-10-CM

## 2023-05-27 ENCOUNTER — Other Ambulatory Visit: Payer: Self-pay | Admitting: Cardiology

## 2023-05-29 ENCOUNTER — Other Ambulatory Visit: Payer: Self-pay | Admitting: Cardiology

## 2023-05-29 DIAGNOSIS — I4819 Other persistent atrial fibrillation: Secondary | ICD-10-CM

## 2023-05-29 NOTE — Telephone Encounter (Signed)
 Prescription refill request for Eliquis received. Indication: Afib  Last office visit: 02/04/23 ([place])  Scr: 1.13 (02/04/23)  Age: 89 Weight: 98.9kg  Appropriate dose. Refill sent.

## 2023-05-31 ENCOUNTER — Other Ambulatory Visit: Payer: Self-pay | Admitting: Cardiology

## 2023-06-13 ENCOUNTER — Other Ambulatory Visit: Payer: Self-pay | Admitting: Family Medicine

## 2023-06-13 DIAGNOSIS — F418 Other specified anxiety disorders: Secondary | ICD-10-CM

## 2023-06-25 ENCOUNTER — Encounter: Payer: Self-pay | Admitting: Family Medicine

## 2023-06-25 ENCOUNTER — Ambulatory Visit (INDEPENDENT_AMBULATORY_CARE_PROVIDER_SITE_OTHER): Admitting: Family Medicine

## 2023-06-25 VITALS — BP 110/64 | HR 84 | Temp 98.6°F | Ht 66.0 in | Wt 216.0 lb

## 2023-06-25 DIAGNOSIS — F418 Other specified anxiety disorders: Secondary | ICD-10-CM

## 2023-06-25 DIAGNOSIS — I509 Heart failure, unspecified: Secondary | ICD-10-CM

## 2023-06-25 MED ORDER — MIRTAZAPINE 7.5 MG PO TABS: 7.5000 mg | ORAL_TABLET | Freq: Every day | ORAL | 2 refills | Status: DC

## 2023-06-25 MED ORDER — IN-CHECK INSPIRATORY FLOW MTR DEVI: 0 refills | Status: AC

## 2023-06-25 NOTE — Progress Notes (Signed)
 Established Patient Office Visit   Subjective:  Patient ID: Kaitlyn Rodriguez, female    DOB: 25-Jun-1933  Age: 89 y.o. MRN: [id number]  Chief Complaint  Patient presents with   Anxiety    6 week f/u.  Able to take Xanax  but not able to take the other medication.      Anxiety     Encounter Diagnoses  Name Primary?   Anxiety with depression Yes   Congestive heart failure, unspecified HF chronicity, unspecified heart failure type (HCC)    For follow-up of anxiety with depression.  She said that the Remeron  made her sick with nausea.  When I explained that that medication did not have that side effect, she admitted that she had not really tried it.  History of congestive heart failure treated with rotating doses of Demadex  and Zaroxolyn .  She is up and down to the bathroom frequently.   Review of Systems  Constitutional: Negative.   HENT: Negative.    Eyes:  Negative for blurred vision, discharge and redness.  Respiratory: Negative.    Cardiovascular: Negative.   Gastrointestinal:  Negative for abdominal pain.  Genitourinary: Negative.   Musculoskeletal: Negative.  Negative for myalgias.  Skin:  Negative for rash.  Neurological:  Negative for tingling, loss of consciousness and weakness.  Endo/Heme/Allergies:  Negative for polydipsia.     Current Outpatient Medications:    ALPRAZolam  (XANAX ) 0.25 MG tablet, Take 1 tablet by mouth twice daily as needed for anxiety, Disp: 60 tablet, Rfl: 0   apixaban  (ELIQUIS ) 5 MG TABS tablet, Take 1 tablet by mouth twice daily, Disp: 180 tablet, Rfl: 1   cyanocobalamin  (VITAMIN B12) 1000 MCG/ML injection, INJECT 1 ML INTO THE MUSCLE EVERY 30 DAYS, Disp: 3 mL, Rfl: 0   ferrous sulfate  (FEROSUL) 325 (65 FE) MG tablet, Take 1 tablet (325 mg total) by mouth daily with breakfast., Disp: 45 tablet, Rfl: 0   loratadine  (CLARITIN ) 10 MG tablet, Take 1 tablet (10 mg total) by mouth daily., Disp: 30 tablet, Rfl: 11   metolazone  (ZAROXOLYN ) 5 MG tablet,  Take 1 tablet (5 mg total) by mouth once a week., Disp: 12 tablet, Rfl: 2   metoprolol  succinate (TOPROL -XL) 50 MG 24 hr tablet, Take 0.5 tablets (25 mg total) by mouth 2 (two) times daily., Disp: 90 tablet, Rfl: 2   Multiple Vitamin (MULTIVITAMIN WITH MINERALS) TABS tablet, Take 1 tablet by mouth daily., Disp:  , Rfl:    NYSTATIN  powder, APPLY 1 POWDER TOPICALLY TWICE DAILY FOR 2 WEEKS, Disp: 15 g, Rfl: 0   nystatin  powder, Apply 1 Application topically 3 (three) times daily., Disp: 15 g, Rfl: 0   Respiratory Therapy Supplies (IN-CHECK INSPIRATORY FLOW MTR) DEVI, Use as directed., Disp: 1 each, Rfl: 0   SYNTHROID  125 MCG tablet, TAKE 1 TABLET BY MOUTH ONCE DAILY IN THE MORNING APPOINTMENT REQUIRED FOR FUTURE REFILLS, Disp: 90 tablet, Rfl: 3   torsemide  (DEMADEX ) 20 MG tablet, TAKE 1 TABLET BY MOUTH ONCE DAILY AND TAKE 1 TABLET EVERY OTHER NIGHT, Disp: 135 tablet, Rfl: 2   triamcinolone  ointment (KENALOG ) 0.5 %, Apply 1 Application topically 2 (two) times daily., Disp: 30 g, Rfl: 0   mirtazapine  (REMERON ) 7.5 MG tablet, Take 1 tablet (7.5 mg total) by mouth at bedtime., Disp: 30 tablet, Rfl: 2   Objective:     BP 110/64   Pulse 84   Temp 98.6 F (37 C) (Temporal)   Ht 5\' 6"  (1.676 m)   Wt 216 lb (  98 kg)   SpO2 92%   BMI 34.86 kg/m    Physical Exam Constitutional:      General: She is not in acute distress.    Appearance: Normal appearance. She is not ill-appearing, toxic-appearing or diaphoretic.  HENT:     Head: Normocephalic and atraumatic.     Right Ear: External ear normal.     Left Ear: External ear normal.  Eyes:     General: No scleral icterus.       Right eye: No discharge.        Left eye: No discharge.     Extraocular Movements: Extraocular movements intact.     Conjunctiva/sclera: Conjunctivae normal.  Cardiovascular:     Rate and Rhythm: Normal rate and regular rhythm.  Pulmonary:     Effort: Pulmonary effort is normal. No respiratory distress.     Breath  sounds: Examination of the right-lower field reveals rhonchi. Examination of the left-lower field reveals rhonchi. Rhonchi present.  Musculoskeletal:     Right lower leg: No edema.     Left lower leg: No edema.  Skin:    General: Skin is warm and dry.  Neurological:     Mental Status: She is alert and oriented to person, place, and time.  Psychiatric:        Mood and Affect: Mood normal.        Behavior: Behavior normal.      No results found for any visits on 06/25/23.    The ASCVD Risk score (Arnett DK, et al., 2019) failed to calculate for the following reasons:   The 2019 ASCVD risk score is only valid for ages 56 to 60    Assessment & Plan:   Anxiety with depression -     Mirtazapine ; Take 1 tablet (7.5 mg total) by mouth at bedtime.  Dispense: 30 tablet; Refill: 2  Congestive heart failure, unspecified HF chronicity, unspecified heart failure type (HCC) -     In-Check Inspiratory Flow Mtr; Use as directed.  Dispense: 1 each; Refill: 0    Return in about 3 months (around 09/24/2023), or if symptoms worsen or fail to improve.  Would be concerned about increasing dosing of Xanax  secondary to fall risk.  Continue Xanax  0.25 twice daily as needed.  Will try low-dose mirtazapine  7.5 nightly.  Tonna Frederic, MD

## 2023-07-11 ENCOUNTER — Telehealth: Payer: Self-pay | Admitting: Cardiology

## 2023-07-11 ENCOUNTER — Other Ambulatory Visit: Payer: Self-pay | Admitting: Family Medicine

## 2023-07-11 DIAGNOSIS — F418 Other specified anxiety disorders: Secondary | ICD-10-CM

## 2023-07-11 NOTE — Telephone Encounter (Signed)
 Patient wants a recommendation to a dentist that specializes in patients with heart conditions.

## 2023-07-11 NOTE — Telephone Encounter (Signed)
 Called pt spoke to her and her son. Informed we do not have a specific dentist that specializes in heart care pts. Also informed them to have the dentist office send a surgical clearance form to the office if she needs dental work. The verbalized understanding. No further questions at this time.

## 2023-07-14 ENCOUNTER — Other Ambulatory Visit: Payer: Self-pay | Admitting: Family Medicine

## 2023-07-14 DIAGNOSIS — F418 Other specified anxiety disorders: Secondary | ICD-10-CM

## 2023-07-15 ENCOUNTER — Other Ambulatory Visit: Payer: Self-pay | Admitting: Family Medicine

## 2023-07-15 DIAGNOSIS — F418 Other specified anxiety disorders: Secondary | ICD-10-CM

## 2023-07-16 ENCOUNTER — Other Ambulatory Visit: Payer: Self-pay | Admitting: Family Medicine

## 2023-07-16 DIAGNOSIS — F418 Other specified anxiety disorders: Secondary | ICD-10-CM

## 2023-07-16 MED ORDER — ALPRAZOLAM 0.25 MG PO TABS
0.2500 mg | ORAL_TABLET | Freq: Two times a day (BID) | ORAL | 0 refills | Status: DC | PRN
Start: 2023-07-16 — End: 2023-08-15

## 2023-07-16 MED ORDER — ALPRAZOLAM 0.25 MG PO TABS: 0.2500 mg | ORAL_TABLET | Freq: Two times a day (BID) | ORAL | 0 refills | Status: DC | PRN

## 2023-07-16 NOTE — Telephone Encounter (Signed)
 Copied from CRM [id number]. Topic: Clinical - Medication Question >> Jul 16, 2023 10:57 AM Magdalene [employer] wrote: Reason for CRM: Patient would like call back from Trish, Dr Gladys Lamp nurse to discuss her xanax  prescription.

## 2023-07-16 NOTE — Telephone Encounter (Signed)
 Copied from CRM [id number]. Topic: Clinical - Medication Refill >> Jul 16, 2023 10:52 AM Vivian Z wrote: Medication: ALPRAZolam  (XANAX ) 0.25 MG tablet  Has the patient contacted their pharmacy? Yes (Agent: If no, request that the patient contact the pharmacy for the refill. If patient does not wish to contact the pharmacy document the reason why and proceed with request.) (Agent: If yes, when and what did the pharmacy advise?)  This is the patient's preferred pharmacy:  [facility] [address] [place] - [address] [facility] [place] [address] Phone: [phone] Fax: [phone]  Is this the correct pharmacy for this prescription? Yes If no, delete pharmacy and type the correct one.   Has the prescription been filled recently? No  Is the patient out of the medication? Yes  Has the patient been seen for an appointment in the last year OR does the patient have an upcoming appointment? Yes  Can we respond through MyChart? No  Agent: Please be advised that Rx refills may take up to 3 business days. We ask that you follow-up with your pharmacy.

## 2023-08-08 ENCOUNTER — Ambulatory Visit: Payer: Self-pay

## 2023-08-08 NOTE — Telephone Encounter (Signed)
 FYI Only or Action Required?: Action required by provider  Patient was last seen in primary care on 06/25/2023 by Tonna Frederic, MD. Called Nurse Triage reporting Medication Reaction. Symptoms began a week ago. Interventions attempted: Rest, hydration, or home remedies. Symptoms are: unchanged.  Triage Disposition: See Physician Within 24 Hours  Patient/caregiver understands and will follow disposition?: No, refuses disposition         Copied from CRM [id number]. Topic: Clinical - Red Word Triage >> Aug 08, 2023 11:53 AM Allyne Areola wrote: Red Word that prompted transfer to Nurse Triage: Patient is calling because she is on loratadine  (CLARITIN ) 10 MG tablet and feels like she may be experiencing side effects, She doesn't have an appetite, stomach pain, dry mouth. Reason for Disposition  Age > 60 years  Answer Assessment - Initial Assessment Questions 1. LOCATION: Where does it hurt?      Lower belly   2. RADIATION: Does the pain shoot anywhere else? (e.g., chest, back)     No  3. ONSET: When did the pain begin? (e.g., minutes, hours or days ago)      Several days  4. SUDDEN: Gradual or sudden onset?     Gradual  5. PATTERN Does the pain come and go, or is it constant?    - If it comes and goes: How long does it last? Do you have pain now?     (Note: Comes and goes means the pain is intermittent. It goes away completely between bouts.)    - If constant: Is it getting better, staying the same, or getting worse?      (Note: Constant means the pain never goes away completely; most serious pain is constant and gets worse.)      Mainly in the morning  6. SEVERITY: How bad is the pain?  (e.g., Scale 1-10; mild, moderate, or severe)    - MILD (1-3): Doesn't interfere with normal activities, abdomen soft and not tender to touch.     - MODERATE (4-7): Interferes with normal activities or awakens from sleep, abdomen tender to touch.     - SEVERE (8-10):  Excruciating pain, doubled over, unable to do any normal activities.       Mild  7. RECURRENT SYMPTOM: Have you ever had this type of stomach pain before? If Yes, ask: When was the last time? and What happened that time?      No  8. CAUSE: What do you think is causing the stomach pain?     Patient thinks it could be related to taking claritin   9. RELIEVING/AGGRAVATING FACTORS: What makes it better or worse? (e.g., antacids, bending or twisting motion, bowel movement)     Eating acidic foods hurts stomach, TUMS helps   10. OTHER SYMPTOMS: Do you have any other symptoms? (e.g., back pain, diarrhea, fever, urination pain, vomiting)       Poor appetite and dry mouth  11. PREGNANCY: Is there any chance you are pregnant? When was your last menstrual period?       No   Pt reports abdominal discomfort says she is not having pain, but her stomach feels funny. Pt also reports dry mouth and no appetite. She strongly feels her symptoms are related to her taking Claritin . Pt is requesting new medication. Pt declined scheduling at this time.  Protocols used: Abdominal Pain - Female-A-AH

## 2023-08-15 ENCOUNTER — Other Ambulatory Visit: Payer: Self-pay | Admitting: Family Medicine

## 2023-08-15 DIAGNOSIS — F418 Other specified anxiety disorders: Secondary | ICD-10-CM

## 2023-08-19 ENCOUNTER — Telehealth: Payer: Self-pay | Admitting: Cardiology

## 2023-08-19 NOTE — Telephone Encounter (Signed)
 Son is wanting to switch patient from Dr Sheena to Dr Edwyna because they live closer to [place]. Please Advise

## 2023-08-21 ENCOUNTER — Ambulatory Visit: Admitting: Emergency Medicine

## 2023-08-22 ENCOUNTER — Other Ambulatory Visit: Payer: Self-pay | Admitting: Family Medicine

## 2023-08-22 DIAGNOSIS — D519 Vitamin B12 deficiency anemia, unspecified: Secondary | ICD-10-CM

## 2023-09-06 ENCOUNTER — Telehealth: Payer: Self-pay | Admitting: Cardiology

## 2023-09-06 NOTE — Telephone Encounter (Signed)
 Pt stated she'd been feeling very fatigue lately and not feeling well. When asked what's wrong? She stated nothing is wrong I just don't feel good. Im very tired.  She stated there's no symptoms. Please advise

## 2023-09-09 NOTE — Telephone Encounter (Signed)
 Advised to call PCP to discuss problems with her nerves as she states it keeps her up at night. No cardiac complaints.

## 2023-09-10 ENCOUNTER — Telehealth: Payer: Self-pay | Admitting: Cardiology

## 2023-09-10 NOTE — Telephone Encounter (Signed)
 Pt states she is not hurting anywhere nor any specific symptoms, just doesn't feel well. Her son states she is having really bad anxiety. Requesting cb

## 2023-09-11 NOTE — Telephone Encounter (Signed)
 Number busy times 2

## 2023-09-12 NOTE — Telephone Encounter (Signed)
 Called the patient and she asked me to speak with her son Debby. Spoke with the patient's son per DPR and she stated that she was just not feeling good. She does not have any symptoms at this time. Based on her not feeling good and not having any symptoms, I recommended that she follow up with her PCP. Debby verbalized understanding and had no further questions at this time.

## 2023-09-16 ENCOUNTER — Other Ambulatory Visit: Payer: Self-pay | Admitting: Family Medicine

## 2023-09-16 DIAGNOSIS — F418 Other specified anxiety disorders: Secondary | ICD-10-CM

## 2023-09-18 DIAGNOSIS — I509 Heart failure, unspecified: Secondary | ICD-10-CM | POA: Insufficient documentation

## 2023-09-18 DIAGNOSIS — F419 Anxiety disorder, unspecified: Secondary | ICD-10-CM | POA: Insufficient documentation

## 2023-09-19 ENCOUNTER — Other Ambulatory Visit: Payer: Self-pay | Admitting: Cardiology

## 2023-09-19 ENCOUNTER — Ambulatory Visit: Attending: Cardiology | Admitting: Cardiology

## 2023-09-19 ENCOUNTER — Encounter: Payer: Self-pay | Admitting: Cardiology

## 2023-09-19 VITALS — BP 102/76 | HR 94 | Ht 67.0 in | Wt 212.0 lb

## 2023-09-19 DIAGNOSIS — Z8679 Personal history of other diseases of the circulatory system: Secondary | ICD-10-CM

## 2023-09-19 DIAGNOSIS — Z9889 Other specified postprocedural states: Secondary | ICD-10-CM | POA: Diagnosis not present

## 2023-09-19 DIAGNOSIS — Z952 Presence of prosthetic heart valve: Secondary | ICD-10-CM

## 2023-09-19 DIAGNOSIS — E669 Obesity, unspecified: Secondary | ICD-10-CM

## 2023-09-19 DIAGNOSIS — E611 Iron deficiency: Secondary | ICD-10-CM | POA: Diagnosis not present

## 2023-09-19 DIAGNOSIS — I4819 Other persistent atrial fibrillation: Secondary | ICD-10-CM

## 2023-09-19 DIAGNOSIS — I10 Essential (primary) hypertension: Secondary | ICD-10-CM

## 2023-09-19 DIAGNOSIS — D519 Vitamin B12 deficiency anemia, unspecified: Secondary | ICD-10-CM | POA: Diagnosis not present

## 2023-09-19 NOTE — Progress Notes (Signed)
 Cardiology Office Note:    Date:  09/19/2023   ID:  Kaitlyn Rodriguez, DOB 1933-12-10, MRN [id number]  PCP:  Berneta Elsie Sayre, MD  Cardiologist:  Jennifer JONELLE Crape, MD   Referring MD: Berneta Elsie Sayre,*    ASSESSMENT:    1. Benign essential hypertension   2. Iron deficiency   3. Persistent atrial fibrillation (HCC)   4. S/P aortic aneurysm repair   5. Obesity (BMI 30-39.9)   6. History of aortic valve replacement    PLAN:    In order of problems listed above:  Primary prevention stressed with the patient.  Importance of compliance with diet medication stressed and patient verbalized standing. Persistent atrial fibrillation:I discussed with the patient atrial fibrillation, disease process. Management and therapy including rate and rhythm control, anticoagulation benefits and potential risks were discussed extensively with the patient. Patient had multiple questions which were answered to patient's satisfaction. Mitral regurgitation: Stable at this time.  Medical management.  I do not see any reason to evaluate this further.  Patient is stable clinically she is not a candidate for any surgical intervention because of her severe comorbidities and advanced age. Essential hypertension: Blood pressure is stable and diet was emphasized.  Multiple blood pressure readings from home are fine. Post TAVR and aortic aneurysm repair: Stable and asymptomatic.  Medical management. Patient will be seen in follow-up appointment in 6 months or earlier if the patient has any concerns.    Medication Adjustments/Labs and Tests Ordered: Current medicines are reviewed at length with the patient today.  Concerns regarding medicines are outlined above.  Orders Placed This Encounter  Procedures   EKG 12-Lead   No orders of the defined types were placed in this encounter.    No chief complaint on file.    History of Present Illness:    Kaitlyn Rodriguez is a 89 y.o. female.  Patient has past  medical history of essential hypertension, persistent atrial fibrillation on anticoagulation, post aortic aneurysm repair and history of aortic valve replacement.  She essentially leads a sedentary lifestyle because of orthopedic issues.  She denies any chest pain orthopnea or PND.  Her son lives with her and is very supportive.  At the time of my evaluation, the patient is alert awake oriented and in no distress.  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   Dissection of unspecified site of aorta (HCC) 11/2018   Goals of care, counseling/discussion    Heart failure, unspecified (HCC)    History of aortic valve replacement    Hypercoagulable state due to persistent atrial fibrillation (HCC) 09/26/2021   Hypervolemia    Hyponatremia    Hypothyroidism 12/21/2018   Hypothyroidism, unspecified    Intertrigo 12/13/2022   Iron deficiency 93/80/7976   LSC (lichen simplex chronicus) 01/01/2022   Nonrheumatic aortic valve insufficiency 07/27/2020   Nonrheumatic mitral valve regurgitation 07/27/2020   Obesity (BMI 30-39.9) 07/27/2020   Palliative care by specialist    Persistent atrial fibrillation (HCC)    Physical deconditioning 08/14/2021   Pleural effusion    Pulmonary hypertension (HCC) 07/27/2020   Respiratory failure (HCC)  S/P aortic aneurysm repair 12/21/2018   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   Thyroid  disease    TIA (transient ischemic attack) 12/05/2020   Unspecified osteoarthritis, unspecified site    Vitamin B12 deficiency anemia     Past Surgical History:  Procedure Laterality Date   ABDOMINAL  HYSTERECTOMY     at age 75   REPLACEMENT ASCENDING AORTA N/A 12/20/2018   Procedure: REPAIR OF TYPE A AORTIC DISSECTION USING HEMASHIELD PLATINUM AND GRAFT;  Surgeon: German Bartlett PEDLAR, MD;  Location: [facility] OR;  Service: Open Heart Surgery;  Laterality: N/A;   STERNAL CLOSURE  12/20/2018   Procedure: STERNAL PLATING;  Surgeon: German Bartlett PEDLAR, MD;  Location: [facility] OR;  Service: Open Heart Surgery;;    Current Medications: Current Meds  Medication Sig   ALPRAZolam  (XANAX ) 0.25 MG tablet TAKE 1 TABLET BY MOUTH TWICE DAILY AS NEEDED FOR ANXIETY NOT  TO  BE  REFILLED  UNTIL  06/20   apixaban  (ELIQUIS ) 5 MG TABS tablet Take 1 tablet by mouth twice daily   cyanocobalamin  (VITAMIN B12) 1000 MCG/ML injection INJECT 1ML INTO THE MUSCLE EVERY 30 DAYS   ferrous sulfate  (FEROSUL) 325 (65 FE) MG tablet Take 1 tablet (325 mg total) by mouth daily with breakfast. (Patient taking differently: Take 325 mg by mouth every other day.)   loratadine  (CLARITIN ) 10 MG tablet Take 1 tablet (10 mg total) by mouth daily.   metolazone  (ZAROXOLYN ) 5 MG tablet Take 1 tablet (5 mg total) by mouth once a week.   metoprolol  succinate (TOPROL -XL) 50 MG 24 hr tablet Take 0.5 tablets (25 mg total) by mouth 2 (two) times daily.   Multiple Vitamin (MULTIVITAMIN WITH MINERALS) TABS tablet Take 1 tablet by mouth daily.   NYSTATIN  powder APPLY 1 POWDER TOPICALLY TWICE DAILY FOR 2 WEEKS   nystatin  powder Apply 1 Application topically 3 (three) times daily.   Respiratory Therapy Supplies (IN-CHECK INSPIRATORY FLOW MTR) DEVI Use as directed.   SYNTHROID  125 MCG tablet TAKE 1 TABLET BY MOUTH ONCE DAILY IN THE MORNING APPOINTMENT REQUIRED FOR FUTURE REFILLS   torsemide  (DEMADEX ) 20 MG tablet TAKE 1 TABLET BY MOUTH ONCE DAILY AND TAKE 1 TABLET EVERY OTHER NIGHT   triamcinolone  ointment (KENALOG ) 0.5 % Apply 1 Application topically 2 (two) times daily.     Allergies:   Celexa  [citalopram ] and Sertraline  hcl   Social History    Socioeconomic History   Marital status: Widowed    Spouse name: Not on file   Number of children: Not on file   Years of education: Not on file   Highest education level: Not on file  Occupational History   Not on file  Tobacco Use   Smoking status: Never   Smokeless tobacco: Never  Vaping Use   Vaping status: Never Used  Substance and Sexual Activity   Alcohol use: Never   Drug use: Never   Sexual activity: Not Currently  Other Topics Concern   Not on file  Social History Narrative   Not on file   Social Drivers of Health   Financial Resource Strain: Low Risk  (04/19/2023)   Overall Financial Resource Strain (CARDIA)    Difficulty of Paying Living Expenses: Not hard at all  Food Insecurity: No Food Insecurity (04/19/2023)   Hunger Vital Sign    Worried About Running Out of Food in the Last Year: Never true    Ran Out of Food in the Last Year: Never true  Transportation Needs: No Transportation  Needs (04/19/2023)   PRAPARE - Administrator, Civil Service (Medical): No    Lack of Transportation (Non-Medical): No  Physical Activity: Insufficiently Active (04/19/2023)   Exercise Vital Sign    Days of Exercise per Week: 7 days    Minutes of Exercise per Session: 20 min  Stress: Stress Concern Present (04/19/2023)   [employer] of Occupational Health - Occupational Stress Questionnaire    Feeling of Stress : To some extent  Social Connections: Unknown (04/19/2023)   Social Connection and Isolation Panel    Frequency of Communication with Friends and Family: Twice a week    Frequency of Social Gatherings with Friends and Family: Not on file    Attends Religious Services: Never    Database administrator or Organizations: No    Attends Banker Meetings: Never    Marital Status: Widowed     Family History: The patient's family history includes Hypertension in her father.  ROS:   Please see the history of present illness.    All other systems  reviewed and are negative.  EKGs/Labs/Other Studies Reviewed:    The following studies were reviewed today: .SABRAEKG Interpretation Date/Time:  Thursday September 19 2023 15:20:47 EDT Ventricular Rate:  94 PR Interval:    QRS Duration:  126 QT Interval:  348 QTC Calculation: 435 R Axis:   207  Text Interpretation: Atrial fibrillation with premature ventricular or aberrantly conducted complexes Right bundle branch block Septal infarct (cited on or before 03-Dec-2020) Abnormal ECG When compared with ECG of 03-Dec-2020 12:10, Right bundle branch block is now Present Questionable change in initial forces of Lateral leads Confirmed by Edwyna Backers [id number]) on 09/19/2023 3:38:43 PM     Recent Labs: 11/01/2022: Hemoglobin 13.4; Platelets 217.0; TSH 1.54 02/04/2023: ALT 12; BUN 29; Creatinine, Ser 1.13; Magnesium  1.8; Potassium 4.0; Sodium 139  Recent Lipid Panel    Component Value Date/Time   CHOL 154 12/04/2020 0422   TRIG 72 12/04/2020 0422   HDL 46 12/04/2020 0422   CHOLHDL 3.3 12/04/2020 0422   VLDL 14 12/04/2020 0422   LDLCALC 94 12/04/2020 0422    Physical Exam:    VS:  BP 102/76   Pulse 94   Ht 5' 7 (1.702 m)   Wt 212 lb (96.2 kg)   SpO2 92%   BMI 33.20 kg/m     Wt Readings from Last 3 Encounters:  09/19/23 212 lb (96.2 kg)  06/25/23 216 lb (98 kg)  02/04/23 218 lb (98.9 kg)     GEN: Patient is in no acute distress HEENT: Normal NECK: No JVD; No carotid bruits LYMPHATICS: No lymphadenopathy CARDIAC: Hear sounds regular, 2/6 systolic murmur at the apex. RESPIRATORY:  Clear to auscultation without rales, wheezing or rhonchi  ABDOMEN: Soft, non-tender, non-distended MUSCULOSKELETAL:  No edema; No deformity  SKIN: Warm and dry NEUROLOGIC:  Alert and oriented x 3 PSYCHIATRIC:  Normal affect   Signed, Backers JONELLE Edwyna, MD  09/19/2023 3:40 PM    [facility]

## 2023-09-19 NOTE — Patient Instructions (Signed)
 Medication Instructions:  Your physician recommends that you continue on your current medications as directed. Please refer to the Current Medication list given to you today.  *If you need a refill on your cardiac medications before your next appointment, please call your pharmacy*  Lab Work: Your physician recommends that you return for lab work in:   Labs in next few days: CMP, LFT, Lipids, CBC, TSH, Vitamin D    If you have labs (blood work) drawn today and your tests are completely normal, you will receive your results only by: MyChart Message (if you have MyChart) OR A paper copy in the mail If you have any lab test that is abnormal or we need to change your treatment, we will call you to review the results.  Testing/Procedures: None  Follow-Up: At [facility], you and your health needs are our priority.  As part of our continuing mission to provide you with exceptional heart care, our providers are all part of one team.  This team includes your primary Cardiologist (physician) and Advanced Practice Providers or APPs (Physician Assistants and Nurse Practitioners) who all work together to provide you with the care you need, when you need it.  Your next appointment:   9 month(s)  Provider:   Jennifer Crape, MD    We recommend signing up for the patient portal called MyChart.  Sign up information is provided on this After Visit Summary.  MyChart is used to connect with patients for Virtual Visits (Telemedicine).  Patients are able to view lab/test results, encounter notes, upcoming appointments, etc.  Non-urgent messages can be sent to your provider as well.   To learn more about what you can do with MyChart, go to [email or web address].   Other Instructions None

## 2023-09-20 ENCOUNTER — Telehealth: Payer: Self-pay | Admitting: Cardiology

## 2023-09-20 ENCOUNTER — Ambulatory Visit: Payer: Self-pay | Admitting: Cardiology

## 2023-09-20 LAB — COMPREHENSIVE METABOLIC PANEL WITH GFR
ALT: 9 IU/L (ref 0–32)
AST: 18 IU/L (ref 0–40)
Albumin: 4 g/dL (ref 3.7–4.7)
Alkaline Phosphatase: 66 IU/L (ref 44–121)
BUN/Creatinine Ratio: 22 (ref 12–28)
BUN: 24 mg/dL (ref 8–27)
Bilirubin Total: 0.7 mg/dL (ref 0.0–1.2)
CO2: 28 mmol/L (ref 20–29)
Calcium: 9.7 mg/dL (ref 8.7–10.3)
Chloride: 85 mmol/L — ABNORMAL LOW (ref 96–106)
Creatinine, Ser: 1.07 mg/dL — ABNORMAL HIGH (ref 0.57–1.00)
Globulin, Total: 2.9 g/dL (ref 1.5–4.5)
Glucose: 84 mg/dL (ref 70–99)
Potassium: 3.8 mmol/L (ref 3.5–5.2)
Sodium: 130 mmol/L — ABNORMAL LOW (ref 134–144)
Total Protein: 6.9 g/dL (ref 6.0–8.5)
eGFR: 50 mL/min/1.73 — ABNORMAL LOW (ref >59)

## 2023-09-20 LAB — CBC
Hematocrit: 42.3 % (ref 34.0–46.6)
Hemoglobin: 14.1 g/dL (ref 11.1–15.9)
MCH: 32.2 pg (ref 26.6–33.0)
MCHC: 33.3 g/dL (ref 31.5–35.7)
MCV: 97 fL (ref 79–97)
Platelets: 215 x10E3/uL (ref 150–450)
RBC: 4.38 x10E6/uL (ref 3.77–5.28)
RDW: 13.8 % (ref 11.7–15.4)
WBC: 9.5 x10E3/uL (ref 3.4–10.8)

## 2023-09-20 LAB — LIPID PANEL
Chol/HDL Ratio: 3.1 ratio (ref 0.0–4.4)
Cholesterol, Total: 170 mg/dL (ref 100–199)
HDL: 54 mg/dL (ref >39)
LDL Chol Calc (NIH): 99 mg/dL (ref 0–99)
Triglycerides: 93 mg/dL (ref 0–149)
VLDL Cholesterol Cal: 17 mg/dL (ref 5–40)

## 2023-09-20 LAB — HEPATIC FUNCTION PANEL: Bilirubin, Direct: 0.33 mg/dL (ref 0.00–0.40)

## 2023-09-20 LAB — VITAMIN D 25 HYDROXY (VIT D DEFICIENCY, FRACTURES): Vit D, 25-Hydroxy: 38.4 ng/mL (ref 30.0–100.0)

## 2023-09-20 LAB — TSH: TSH: 1.59 u[IU]/mL (ref 0.450–4.500)

## 2023-09-20 NOTE — Telephone Encounter (Signed)
 Pt is requesting a callback regarding her checking the status of her labs from yesterday. Please advise

## 2023-09-20 NOTE — Telephone Encounter (Signed)
 Called the patient and informed her of the results of her labs from yesterday (09/19/23). Patient verbalized understanding and had no further questions at this time.

## 2023-09-24 NOTE — Telephone Encounter (Signed)
 Copied from CRM [id number]. Topic: Clinical - Medical Advice >> Sep 24, 2023  1:21 PM Chasity T wrote: Reason for CRM: Patient is calling to request for Dr Berneta to find her a doctor to talk to her. Not fully sure what she needs but please contact her back to fully understand. Please contact her back on the home number.

## 2023-09-25 ENCOUNTER — Ambulatory Visit: Payer: Self-pay

## 2023-09-25 ENCOUNTER — Telehealth: Payer: Self-pay

## 2023-09-25 NOTE — Telephone Encounter (Signed)
 Pt already scheduled  TOC appointment.  Kaitlyn Rodriguez

## 2023-09-25 NOTE — Telephone Encounter (Signed)
 FYI Only or Action Required?: FYI only for provider.  Patient was last seen in primary care on 06/25/2023 by Berneta Elsie Sayre, MD.  Called Nurse Triage reporting Fatigue and Anxiety.  Symptoms began several months ago.  Interventions attempted: Prescription medications: Xanax  and Rest, hydration, or home remedies.  Symptoms are: unchanged.  Triage Disposition: See PCP Within 2 Weeks-TOC appointment made for 10/07/2023  Patient/caregiver understands and will follow disposition?: Yes  Copied from CRM #[id number]. Topic: Clinical - Red Word Triage >> Sep 25, 2023 12:43 PM Charlet HERO wrote: Red Word that prompted transfer to Nurse Triage: Patient is stating that she is feeling very fatigued and she is having really bad anxiety to she is not able to sleep she wants to switch her provider to Dr. Dottie Reason for Disposition  [1] Symptoms of anxiety or panic attack AND [2] is a chronic symptom (recurrent or ongoing AND present > 4 weeks)  Answer Assessment - Initial Assessment Questions 1. CONCERN: Did anything happen that prompted you to call today?      Patient reports feeling fatigued and very nervous 2. ANXIETY SYMPTOMS: Can you describe how you (your loved one; patient) have been feeling? (e.g., tense, restless, panicky, anxious, keyed up, overwhelmed, sense of impending doom).      Anxious, panicky, restless 3. ONSET: How long have you been feeling this way? (e.g., hours, days, weeks)     Been going on for awhile 4. SEVERITY: How would you rate the level of anxiety? (e.g., 0 - 10; or mild, moderate, severe).     moderate 5. FUNCTIONAL IMPAIRMENT: How have these feelings affected your ability to do daily activities? Have you had more difficulty than usual doing your normal daily activities? (e.g., getting better, same, worse; self-care, school, work, interactions)     Patient reports she doesn't do much through out the day.  6. HISTORY: Have you felt this way before?  Have you ever been diagnosed with an anxiety problem in the past? (e.g., generalized anxiety disorder, panic attacks, PTSD). If Yes, ask: How was this problem treated? (e.g., medicines, counseling, etc.)     yes 7. RISK OF HARM - SUICIDAL IDEATION: Do you ever have thoughts of hurting or killing yourself? If Yes, ask:  Do you have these feelings now? Do you have a plan on how you would do this?     no 8. TREATMENT:  What has been done so far to treat this anxiety? (e.g., medicines, relaxation strategies). What has helped?     Xanax  9. THERAPIST: Do you have a counselor or therapist? If Yes, ask: What is their name?     N/a 10. POTENTIAL TRIGGERS: Do you drink caffeinated beverages (e.g., coffee, colas, teas), and how much daily? Do you drink alcohol or use any drugs? Have you started any new medicines recently?       Drinks coffee 11. PATIENT SUPPORT: Who is with you now? Who do you live with? Do you have family or friends who you can talk to?        Son-Tommy 12. OTHER SYMPTOMS: Do you have any other symptoms? (e.g., feeling depressed, trouble concentrating, trouble sleeping, trouble breathing, palpitations or fast heartbeat, chest pain, sweating, nausea, or diarrhea)       Fatigued  Patient calling to request transfer of care from current PCP to [facility]. Patient requesting to see new provider for her concerns and doesn't want to be seen by any other provider. Information given about Behavioral health urgent  care located in [place]. Address given to patient and son. Patient is encouraged to utilize the [facility] as she is wanting to wait to see new provider with [facility] appointment.  Protocols used: Anxiety and Panic Attack-A-AH

## 2023-09-25 NOTE — Telephone Encounter (Signed)
 Copied from CRM [id number]. Topic: Appointments - Transfer of Care >> Sep 25, 2023  1:13 PM [name] T wrote: Pt is requesting to transfer FROM: [facility] is requesting to transfer TO: [facility] Reason for requested transfer: patient requested, due to location It is the responsibility of the team the patient would like to transfer to (Dr. Dottie) to reach out to the patient if for any reason this transfer is not acceptable.

## 2023-09-30 ENCOUNTER — Ambulatory Visit: Admitting: Family Medicine

## 2023-10-02 ENCOUNTER — Other Ambulatory Visit: Payer: Self-pay

## 2023-10-02 MED ORDER — METOLAZONE 5 MG PO TABS: 5.0000 mg | ORAL_TABLET | ORAL | 3 refills | Status: AC

## 2023-10-07 ENCOUNTER — Encounter (HOSPITAL_BASED_OUTPATIENT_CLINIC_OR_DEPARTMENT_OTHER): Payer: Self-pay | Admitting: Family Medicine

## 2023-10-07 ENCOUNTER — Ambulatory Visit (HOSPITAL_BASED_OUTPATIENT_CLINIC_OR_DEPARTMENT_OTHER): Admitting: Family Medicine

## 2023-10-07 VITALS — BP 104/68 | HR 84 | Temp 98.2°F | Resp 16 | Ht 65.0 in | Wt 211.0 lb

## 2023-10-07 DIAGNOSIS — I10 Essential (primary) hypertension: Secondary | ICD-10-CM | POA: Diagnosis not present

## 2023-10-07 DIAGNOSIS — E039 Hypothyroidism, unspecified: Secondary | ICD-10-CM | POA: Diagnosis not present

## 2023-10-07 DIAGNOSIS — F419 Anxiety disorder, unspecified: Secondary | ICD-10-CM | POA: Diagnosis not present

## 2023-10-07 DIAGNOSIS — N1831 Chronic kidney disease, stage 3a: Secondary | ICD-10-CM | POA: Diagnosis not present

## 2023-10-07 DIAGNOSIS — I4819 Other persistent atrial fibrillation: Secondary | ICD-10-CM

## 2023-10-07 NOTE — Progress Notes (Signed)
 New Patient Office Visit  Subjective    Patient ID: Kaitlyn Rodriguez, female    DOB: 11/11/1933  Age: 89 y.o. MRN: [id number]  CC:  Chief Complaint  Patient presents with   Establish Care    Establish care     HPI Kaitlyn Rodriguez presents to establish care F/u as above.  New to my practice.  She reports lots of issues with uncontrolled anxiety.  Also some concerns about her BP running too low.  She is able to get around at home pretty well with her walker.  Outpatient Encounter Medications as of 10/07/2023  Medication Sig   ALPRAZolam  (XANAX ) 0.25 MG tablet TAKE 1 TABLET BY MOUTH TWICE DAILY AS NEEDED FOR ANXIETY NOT  TO  BE  REFILLED  UNTIL  06/20   apixaban  (ELIQUIS ) 5 MG TABS tablet Take 1 tablet by mouth twice daily   cyanocobalamin  (VITAMIN B12) 1000 MCG/ML injection INJECT 1ML INTO THE MUSCLE EVERY 30 DAYS   ferrous sulfate  (FEROSUL) 325 (65 FE) MG tablet Take 1 tablet (325 mg total) by mouth daily with breakfast. (Patient taking differently: Take 325 mg by mouth every other day.)   loratadine  (CLARITIN ) 10 MG tablet Take 1 tablet (10 mg total) by mouth daily.   metolazone  (ZAROXOLYN ) 5 MG tablet Take 1 tablet (5 mg total) by mouth once a week.   metoprolol  succinate (TOPROL -XL) 50 MG 24 hr tablet Take 0.5 tablets (25 mg total) by mouth 2 (two) times daily.   Multiple Vitamin (MULTIVITAMIN WITH MINERALS) TABS tablet Take 1 tablet by mouth daily.   NYSTATIN  powder APPLY 1 POWDER TOPICALLY TWICE DAILY FOR 2 WEEKS   nystatin  powder Apply 1 Application topically 3 (three) times daily.   Respiratory Therapy Supplies (IN-CHECK INSPIRATORY FLOW MTR) DEVI Use as directed.   SYNTHROID  125 MCG tablet TAKE 1 TABLET BY MOUTH ONCE DAILY IN THE MORNING APPOINTMENT REQUIRED FOR FUTURE REFILLS   torsemide  (DEMADEX ) 20 MG tablet TAKE 1 TABLET BY MOUTH ONCE DAILY AND TAKE 1 TABLET EVERY OTHER NIGHT   triamcinolone  ointment (KENALOG ) 0.5 % Apply 1 Application topically 2 (two) times daily.    mirtazapine  (REMERON ) 7.5 MG tablet Take 1 tablet (7.5 mg total) by mouth at bedtime. (Patient not taking: Reported on 10/07/2023)   No facility-administered encounter medications on file as of 10/07/2023.    Past Medical History:  Diagnosis Date   Anxiety 01/01/2022   Benign essential hypertension    CKD (chronic kidney disease) stage 3, GFR 30-59 ml/min (HCC)    Heart failure, unspecified (HCC)    Hypothyroidism 89/74/7979   LSC (lichen simplex chronicus) 01/01/2022   Mitral regurgitation    Obesity (BMI 30-39.9) 07/27/2020   Persistent atrial fibrillation (HCC)    f/by Dr. 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   TIA (transient ischemic attack) 12/05/2020   Unspecified osteoarthritis, unspecified site    Vitamin B12 deficiency anemia     Past Surgical History:  Procedure Laterality Date   ABDOMINAL HYSTERECTOMY N/A    for noncancerous reasons   AORTIC VALVE REPLACEMENT N/A    REPLACEMENT ASCENDING AORTA N/A 12/20/2018   Procedure: REPAIR OF TYPE A AORTIC DISSECTION USING HEMASHIELD PLATINUM AND GRAFT;  Surgeon: German Bartlett PEDLAR, MD;  Location: [facility] OR;  Service: Open Heart Surgery;  Laterality: N/A;   STERNAL CLOSURE  12/20/2018   Procedure: STERNAL PLATING;  Surgeon: German Bartlett PEDLAR, MD;  Location: [facility] OR;  Service: Open  Heart Surgery;;    Family History  Problem Relation Age of Onset   Hypertension Father     Social History   Tobacco Use   Smoking status: Never   Smokeless tobacco: Never  Vaping Use   Vaping status: Never Used  Substance Use Topics   Alcohol use: Never   Drug use: Never    Review of Systems  Constitutional:  Positive for malaise/fatigue. Negative for diaphoresis, fever and weight loss.  Respiratory:  Negative for cough, shortness of breath and wheezing.   Cardiovascular:  Positive for leg swelling. Negative for chest pain, palpitations, orthopnea, claudication and PND.         Objective    BP 104/68 (BP Location: Right Arm, Patient Position: Sitting, Cuff Size: Normal)   Pulse 84   Temp 98.2 F (36.8 C) (Oral)   Resp 16   Ht 5' 5 (1.651 m)   Wt 211 lb (95.7 kg)   SpO2 (!) 87%   BMI 35.11 kg/m   Physical Exam Constitutional:      General: She is not in acute distress.    Appearance: Normal appearance. She is obese.     Comments: Comfortable in a wheelchair  HENT:     Head: Normocephalic.  Neck:     Vascular: No carotid bruit.  Cardiovascular:     Rate and Rhythm: Normal rate and regular rhythm.     Pulses: Normal pulses.     Heart sounds: Normal heart sounds.  Pulmonary:     Effort: Pulmonary effort is normal.     Breath sounds: Normal breath sounds.  Abdominal:     General: Bowel sounds are normal.     Palpations: Abdomen is soft.  Musculoskeletal:     Cervical back: Neck supple. No tenderness.     Right lower leg: Edema present.     Left lower leg: Edema present.     Comments: Trace-1+ bilateral LE edema.  1+ pedal pulses.  Neurological:     Mental Status: She is alert.         Assessment & Plan:  Benign essential hypertension Assessment & Plan: Possibly over treated.  I advised her and her family to discuss with Dr. Revankar.  I'm just getting to know her and start reviewing her extensive PMH.  DASH diet advised.  Postural changes for her LE edema.  Start reviewing her old records.   Anxiety Assessment & Plan: Stable.  Arrange for close follow up after I get to know her a bit better.  Eventually encourage a trial of low dose Duloxetine.   Hypothyroidism, unspecified type Assessment & Plan: Clinically stable.  F/u on this further once old records reviewed.   Persistent atrial fibrillation [facility]) Assessment & Plan: F/u Dr. Revankar as advised.   Stage 3a chronic kidney disease (HCC)  I personally spent a total of 45 minutes in the care of the patient today including performing a medically appropriate  exam/evaluation, counseling and educating, documenting clinical information in the EHR, and communicating results.   Return in about 2 weeks (around 10/21/2023) for chronic follow-up.   REDDING PONCE NORLEEN FALCON., MD

## 2023-10-08 ENCOUNTER — Encounter (HOSPITAL_BASED_OUTPATIENT_CLINIC_OR_DEPARTMENT_OTHER): Payer: Self-pay | Admitting: Family Medicine

## 2023-10-08 DIAGNOSIS — N183 Chronic kidney disease, stage 3 unspecified: Secondary | ICD-10-CM | POA: Insufficient documentation

## 2023-10-08 NOTE — Assessment & Plan Note (Signed)
 Possibly over treated.  I advised her and her family to discuss with Dr. Revankar.  I'm just getting to know her and start reviewing her extensive PMH.  DASH diet advised.  Postural changes for her LE edema.  Start reviewing her old records.

## 2023-10-08 NOTE — Assessment & Plan Note (Signed)
 F/u Dr. Revankar as advised.

## 2023-10-08 NOTE — Assessment & Plan Note (Signed)
 Clinically stable.  F/u on this further once old records reviewed.

## 2023-10-08 NOTE — Assessment & Plan Note (Addendum)
 Stable.  Arrange for close follow up after I get to know her a bit better.  Eventually encourage a trial of low dose Duloxetine.

## 2023-10-17 ENCOUNTER — Telehealth (HOSPITAL_BASED_OUTPATIENT_CLINIC_OR_DEPARTMENT_OTHER): Payer: Self-pay | Admitting: Family Medicine

## 2023-10-17 NOTE — Telephone Encounter (Unsigned)
 Copied from CRM #[id number]. Topic: Clinical - Medication Refill >> Oct 17, 2023 10:44 AM Chasity T wrote: Medication: ALPRAZolam  (XANAX ) 0.25 MG tablet   Has the patient contacted their pharmacy? No  This is the patient's preferred pharmacy:  [facility] [address] [place] - [address] [facility] [place] [address] Phone: [phone] Fax: [phone]  Is this the correct pharmacy for this prescription? Yes If no, delete pharmacy and type the correct one.   Has the prescription been filled recently? No  Is the patient out of the medication? No  Has the patient been seen for an appointment in the last year OR does the patient have an upcoming appointment? Yes  Can we respond through MyChart? Yes  Agent: Please be advised that Rx refills may take up to 3 business days. We ask that you follow-up with your pharmacy.

## 2023-10-18 ENCOUNTER — Telehealth (HOSPITAL_BASED_OUTPATIENT_CLINIC_OR_DEPARTMENT_OTHER): Payer: Self-pay | Admitting: Family Medicine

## 2023-10-18 ENCOUNTER — Other Ambulatory Visit (HOSPITAL_BASED_OUTPATIENT_CLINIC_OR_DEPARTMENT_OTHER): Payer: Self-pay | Admitting: Family Medicine

## 2023-10-18 DIAGNOSIS — F418 Other specified anxiety disorders: Secondary | ICD-10-CM

## 2023-10-18 MED ORDER — ALPRAZOLAM 0.25 MG PO TABS: 0.2500 mg | ORAL_TABLET | Freq: Two times a day (BID) | ORAL | 0 refills | Status: DC | PRN

## 2023-10-18 NOTE — Telephone Encounter (Signed)
 Pt would like a refill on her Xanax

## 2023-10-18 NOTE — Telephone Encounter (Signed)
 Copied from CRM [id number]. Topic: Clinical - Medication Refill >> Oct 18, 2023 10:54 AM Wess RAMAN wrote: Medication: ALPRAZolam  (XANAX ) 0.25 MG tablet   Has the patient contacted their pharmacy? No (Agent: If no, request that the patient contact the pharmacy for the refill. If patient does not wish to contact the pharmacy document the reason why and proceed with request.) (Agent: If yes, when and what did the pharmacy advise?)  This is the patient's preferred pharmacy:  [facility] [address], [place] - [address] [facility] [place] [address] Phone: [phone] Fax: [phone]  Is this the correct pharmacy for this prescription? Yes If no, delete pharmacy and type the correct one.   Has the prescription been filled recently? Yes  Is the patient out of the medication? No  Has the patient been seen for an appointment in the last year OR does the patient have an upcoming appointment? Yes  Can we respond through MyChart? No  Agent: Please be advised that Rx refills may take up to 3 business days. We ask that you follow-up with your pharmacy.

## 2023-10-18 NOTE — Telephone Encounter (Signed)
 Copied from CRM #[id number]. Topic: General - Call Back - No Documentation >> Oct 18, 2023 10:58 AM Wess RAMAN wrote: Reason for CRM: Patient would like a callback from Dr.Redding's nurse to discuss medication refill  ALPRAZolam  (XANAX ) 0.25 MG tablet   Callback #: [phone]

## 2023-10-18 NOTE — Telephone Encounter (Signed)
 Gave pt directive from provider.

## 2023-10-18 NOTE — Telephone Encounter (Signed)
**Note De-identified  [place] Obfuscation** Please advise 

## 2023-10-18 NOTE — Telephone Encounter (Signed)
 Copied from CRM [id number]. Topic: Clinical - Medication Question >> Oct 17, 2023 11:16 AM Marissa P wrote: Reason for CRM: Patient would like the nurse to give a call back please to answer some questions

## 2023-11-04 ENCOUNTER — Ambulatory Visit (INDEPENDENT_AMBULATORY_CARE_PROVIDER_SITE_OTHER): Admitting: Family Medicine

## 2023-11-04 ENCOUNTER — Encounter (HOSPITAL_BASED_OUTPATIENT_CLINIC_OR_DEPARTMENT_OTHER): Payer: Self-pay | Admitting: Family Medicine

## 2023-11-04 VITALS — BP 108/75 | HR 86 | Temp 98.7°F | Resp 16 | Wt 210.0 lb

## 2023-11-04 DIAGNOSIS — E871 Hypo-osmolality and hyponatremia: Secondary | ICD-10-CM | POA: Diagnosis not present

## 2023-11-04 DIAGNOSIS — I10 Essential (primary) hypertension: Secondary | ICD-10-CM

## 2023-11-04 DIAGNOSIS — N1831 Chronic kidney disease, stage 3a: Secondary | ICD-10-CM

## 2023-11-04 DIAGNOSIS — B379 Candidiasis, unspecified: Secondary | ICD-10-CM | POA: Insufficient documentation

## 2023-11-04 DIAGNOSIS — F419 Anxiety disorder, unspecified: Secondary | ICD-10-CM | POA: Diagnosis not present

## 2023-11-04 MED ORDER — ALPRAZOLAM 0.25 MG PO TABS: 0.2500 mg | ORAL_TABLET | Freq: Every evening | ORAL | 0 refills | Status: DC | PRN

## 2023-11-04 MED ORDER — NYSTATIN 100000 UNIT/GM EX POWD: 1.0000 | Freq: Three times a day (TID) | CUTANEOUS | 2 refills | Status: DC

## 2023-11-04 NOTE — Progress Notes (Unsigned)
 Established Patient Office Visit  Subjective   Patient ID: Kaitlyn Rodriguez, female    DOB: [birth year]  Age: 89 y.o. MRN: [id number]  Chief Complaint  Patient presents with   Follow-up    Follow-up visit    rash under breast    Rash under breast    Medication Refill    Need refill on Alprazolam  0.25mg  and Ferrous Sulf 325mg     F/u as above.  Overall doing well.  She wonders if she needs to stay on her chronic iron.  Also strongly requests a Xanax  refill.  I declined her request for twice daily dosing.  However, she is clearly quite dependent on it and I suppose I'll refill it in a daily fashion for now.  Medication Refill Pertinent negatives include no chest pain.    Past Medical History:  Diagnosis Date   Anxiety 01/01/2022   Resistant to therapy.  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   Benign essential hypertension    CKD (chronic kidney disease) stage 3, GFR 30-59 ml/min (HCC)    FTT (failure to thrive) in adult    Declines Assisted Living or [facility] nurse   Heart failure, unspecified (HCC)    Hypothyroidism 89/74/7979   LSC (lichen simplex chronicus) 01/01/2022   Mitral regurgitation    Obesity (BMI 30-39.9) 07/27/2020   Persistent atrial fibrillation (HCC)    f/by Dr. 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 Use as needed   ALPRAZolam  (XANAX ) 0.25 MG tablet Take 1 tablet (0.25 mg total) by mouth at bedtime as needed for anxiety.   apixaban  (ELIQUIS ) 5 MG TABS tablet Take 1 tablet by mouth twice daily   cyanocobalamin  (VITAMIN B12) 1000 MCG/ML injection INJECT 1ML INTO THE MUSCLE EVERY 30 DAYS   ferrous sulfate  (FEROSUL) 325 (65 FE) MG tablet Take 1 tablet  (325 mg total) by mouth daily with breakfast. (Patient taking differently: Take 325 mg by mouth every other day.)   loratadine  (CLARITIN ) 10 MG tablet Take 1 tablet (10 mg total) by mouth daily.   metolazone  (ZAROXOLYN ) 5 MG tablet Take 1 tablet (5 mg total) by mouth once a week.   metoprolol  succinate (TOPROL -XL) 50 MG 24 hr tablet Take 0.5 tablets (25 mg total) by mouth 2 (two) times daily.   mirtazapine  (REMERON ) 7.5 MG tablet Take 1 tablet (7.5 mg total) by mouth at bedtime. (Patient not taking: Reported on 10/07/2023)   Multiple Vitamin (MULTIVITAMIN WITH MINERALS) TABS tablet Take 1 tablet by mouth daily.   nystatin  powder Apply topically 2 (two) times daily. Use beneath breasts prn   Respiratory Therapy Supplies (IN-CHECK INSPIRATORY FLOW MTR) DEVI Use as directed.   SYNTHROID  125 MCG tablet TAKE 1 TABLET BY MOUTH ONCE DAILY IN THE MORNING APPOINTMENT REQUIRED FOR FUTURE REFILLS   torsemide  (DEMADEX ) 20 MG tablet TAKE 1 TABLET BY MOUTH ONCE DAILY AND TAKE 1 TABLET EVERY OTHER NIGHT   triamcinolone  ointment (KENALOG ) 0.5 % Apply 1 Application topically 2 (two) times daily.   [DISCONTINUED] ALPRAZolam  (XANAX ) 0.25 MG tablet Take 1 tablet (0.25 mg total) by mouth 2 (two) times daily as needed for anxiety.   [DISCONTINUED] NYSTATIN  powder APPLY 1 POWDER TOPICALLY TWICE DAILY FOR 2 WEEKS   [DISCONTINUED] nystatin   powder Apply 1 Application topically 3 (three) times daily.   No facility-administered encounter medications on file as of 11/04/2023.    Social History   Tobacco Use   Smoking status: Never   Smokeless tobacco: Never  Vaping Use   Vaping status: Never Used  Substance Use Topics   Alcohol use: Never   Drug use: Never      Review of Systems  Constitutional:  Positive for malaise/fatigue. Negative for weight loss.  Cardiovascular:  Negative for chest pain and palpitations.  Neurological:  Negative for dizziness.  Psychiatric/Behavioral:  Negative for depression,  hallucinations, memory loss, substance abuse and suicidal ideas. The patient is nervous/anxious. The patient does not have insomnia.       Objective:     BP 108/75 (BP Location: Right Arm, Patient Position: Sitting, Cuff Size: Large)   Pulse 86   Temp 98.7 F (37.1 C) (Oral)   Resp 16   Wt 210 lb (95.3 kg)   SpO2 (!) 89%   BMI 34.95 kg/m    Physical Exam Constitutional:      General: She is not in acute distress.    Appearance: Normal appearance.     Comments: Chronically ill appearing.  Anxious.  Dependent on a walker.  HENT:     Head: Normocephalic.  Neck:     Vascular: No carotid bruit.  Cardiovascular:     Rate and Rhythm: Normal rate and regular rhythm.     Heart sounds: Normal heart sounds.     Comments: 1+ pedal pulses. Pulmonary:     Effort: Pulmonary effort is normal.     Breath sounds: Normal breath sounds.  Abdominal:     General: Bowel sounds are normal.     Palpations: Abdomen is soft.  Musculoskeletal:     Cervical back: Neck supple. No tenderness.     Right lower leg: No edema.     Left lower leg: No edema.  Neurological:     General: No focal deficit present.     Mental Status: She is alert.      Results for orders placed or performed in visit on 11/04/23  Basic metabolic panel with GFR  Result Value Ref Range   Glucose 104 (H) 70 - 99 mg/dL   BUN 19 8 - 27 mg/dL   Creatinine, Ser 8.96 (H) 0.57 - 1.00 mg/dL   eGFR 52 (L) >40 fO/fpw/8.26   BUN/Creatinine Ratio 18 12 - 28   Sodium 123 (L) 134 - 144 mmol/L   Potassium 3.5 3.5 - 5.2 mmol/L   Chloride 79 (L) 96 - 106 mmol/L   CO2 30 (H) 20 - 29 mmol/L   Calcium  9.2 8.7 - 10.3 mg/dL      The ASCVD Risk score (Arnett DK, et al., 2019) failed to calculate for the following reasons:   The 2019 ASCVD risk score is only valid for ages 5 to 28    Assessment & Plan:  Benign essential hypertension Assessment & Plan: Controlled.  DASH diet.   Anxiety Assessment & Plan: I reluctantly  agreed to a Xanax  refill, but only one daily.  Psychiatry referral advised if she worsens.  She quickly declines any less habit forming options.  Orders: -     ALPRAZolam ; Take 1 tablet (0.25 mg total) by mouth at bedtime as needed for anxiety.  Dispense: 30 tablet; Refill: 0  Stage 3a chronic kidney disease (HCC)  Candida albicans infection Assessment & Plan: Keep area below breasts clean and dry.  Advised home health nursing and/or assisted living and she quickly declines either.  Orders: -     Nystatin ; Apply topically 2 (two) times daily. Use beneath breasts prn  Dispense: 45 g; Refill: 2  Hyponatremia -     Basic metabolic panel with GFR    Return in about 3 months (around 02/03/2024) for chronic follow-up.    REDDING PONCE NORLEEN FALCON., MD

## 2023-11-05 ENCOUNTER — Ambulatory Visit (HOSPITAL_BASED_OUTPATIENT_CLINIC_OR_DEPARTMENT_OTHER): Payer: Self-pay | Admitting: Family Medicine

## 2023-11-05 ENCOUNTER — Telehealth (HOSPITAL_BASED_OUTPATIENT_CLINIC_OR_DEPARTMENT_OTHER): Payer: Self-pay | Admitting: *Deleted

## 2023-11-05 LAB — BASIC METABOLIC PANEL WITH GFR
BUN/Creatinine Ratio: 18 (ref 12–28)
BUN: 19 mg/dL (ref 8–27)
CO2: 30 mmol/L — ABNORMAL HIGH (ref 20–29)
Calcium: 9.2 mg/dL (ref 8.7–10.3)
Chloride: 79 mmol/L — ABNORMAL LOW (ref 96–106)
Creatinine, Ser: 1.03 mg/dL — ABNORMAL HIGH (ref 0.57–1.00)
Glucose: 104 mg/dL — ABNORMAL HIGH (ref 70–99)
Potassium: 3.5 mmol/L (ref 3.5–5.2)
Sodium: 123 mmol/L — ABNORMAL LOW (ref 134–144)
eGFR: 52 mL/min/1.73 — ABNORMAL LOW (ref >59)

## 2023-11-05 MED ORDER — NYSTATIN 100000 UNIT/GM EX POWD: Freq: Two times a day (BID) | CUTANEOUS | 2 refills | Status: DC

## 2023-11-05 NOTE — Assessment & Plan Note (Signed)
 I reluctantly agreed to a Xanax  refill, but only one daily.  Psychiatry referral advised if she worsens.  She quickly declines any less habit forming options.

## 2023-11-05 NOTE — Assessment & Plan Note (Signed)
 Controlled.  DASH diet.

## 2023-11-05 NOTE — Assessment & Plan Note (Signed)
 Keep area below breasts clean and dry.  Advised home health nursing and/or assisted living and she quickly declines either.

## 2023-11-06 ENCOUNTER — Telehealth (HOSPITAL_BASED_OUTPATIENT_CLINIC_OR_DEPARTMENT_OTHER): Payer: Self-pay | Admitting: *Deleted

## 2023-11-07 ENCOUNTER — Telehealth (HOSPITAL_BASED_OUTPATIENT_CLINIC_OR_DEPARTMENT_OTHER): Payer: Self-pay | Admitting: Family Medicine

## 2023-11-07 ENCOUNTER — Telehealth: Payer: Self-pay | Admitting: Cardiology

## 2023-11-07 ENCOUNTER — Other Ambulatory Visit: Payer: Self-pay

## 2023-11-07 DIAGNOSIS — E871 Hypo-osmolality and hyponatremia: Secondary | ICD-10-CM

## 2023-11-07 NOTE — Telephone Encounter (Signed)
 Copied from CRM #[id number]. Topic: General - Other >> Nov 06, 2023  4:06 PM [name] T wrote: Reason for CRM: Received all from son of patient, Madeleine, [place] verified, states he is returning call to office.  Per chart note message, from [facility], advised to call office back.  Called and spoke to office, per front desk, Shane has left for the day, attempted to connect with another nurse to assist, no answer.   Please return call to discuss reason for call further. Can be reached at [phone], or can call [phone].  2nd call returning call from voicemail. Please call back.

## 2023-11-07 NOTE — Telephone Encounter (Signed)
 Called the patient after receiving the message below:  Son is calling stating that patient's PCP Dr Dottie told her to stop taking her fluid pill due to her potassium being too low when she had labs at his office yesterday. She would like to know if she should stop taking the fluid pill or not. Patient is worried because Dr Dottie told her she could have a stroke. Please advise.  The patient's son stated that the patient's sodium level was  low at 123 not her potasium level. I spoke with Dr. Bernie regarding the patient's sodium level and he recommended to hold the patient's Torsemide  and Metalazone and have a BMP drawn next Wednesday or Thursday to evaluate her sodium level. After we have the results of her BMP next week the doctor will decide whether to continue her diuretics. I relayed Dr. Karry recommendation to the patient and her son and they verbalized understanding and had no further questions at this time.

## 2023-11-07 NOTE — Telephone Encounter (Signed)
  Son is calling stating that patient's PCP Dr Dottie told her to stop taking her fluid pill due to her potassium being too low when she had labs at his office yesterday. She would like to know if she should stop taking the fluid pill or not. Patient is worried because Dr Dottie told her she could have a stroke. Please advise.

## 2023-11-07 NOTE — Telephone Encounter (Signed)
 Copied from CRM [id number]. Topic: Clinical - Lab/Test Results >> Nov 07, 2023  3:43 PM Debby BROCKS wrote: Reason for CRM: Patient would like to have a copy of the blood work she did on Sept 8th mailed to her physical address that's on file for the patient

## 2023-11-07 NOTE — Telephone Encounter (Signed)
 Son is calling back for updat, he states it is important. Please advise

## 2023-11-13 NOTE — Telephone Encounter (Signed)
Pt. Was called

## 2023-11-13 NOTE — Telephone Encounter (Signed)
 Pt. Was called and made aware.

## 2023-11-14 ENCOUNTER — Telehealth: Payer: Self-pay | Admitting: Cardiology

## 2023-11-14 LAB — BASIC METABOLIC PANEL WITH GFR
BUN/Creatinine Ratio: 14 (ref 12–28)
BUN: 13 mg/dL (ref 8–27)
CO2: 27 mmol/L (ref 20–29)
Calcium: 9.7 mg/dL (ref 8.7–10.3)
Chloride: 85 mmol/L — ABNORMAL LOW (ref 96–106)
Creatinine, Ser: 0.93 mg/dL (ref 0.57–1.00)
Glucose: 96 mg/dL (ref 70–99)
Potassium: 4.3 mmol/L (ref 3.5–5.2)
Sodium: 128 mmol/L — ABNORMAL LOW (ref 134–144)
eGFR: 59 mL/min/1.73 — ABNORMAL LOW (ref >59)

## 2023-11-14 NOTE — Telephone Encounter (Signed)
 Pt's son is requesting a callback regarding pt wanting to know about her results from labs she had done yesterday on 9/17. Please advise

## 2023-11-15 NOTE — Telephone Encounter (Signed)
 Spoke with Madeleine and pt who states that the pt has not had any diuretics since they were advised to stop. Pt is drinking 60-80 oz of water daily. Ankles are staying the same and pt has gained 3 pounds in a week.

## 2023-11-15 NOTE — Telephone Encounter (Signed)
 Recommendations reviewed with Kaitlyn Rodriguez as per Delon Hoover NP's note. Kaitlyn Rodriguez verbalized understanding and had no additional questions.

## 2023-11-15 NOTE — Telephone Encounter (Signed)
  Patient's son is calling back to follow up

## 2023-11-18 ENCOUNTER — Other Ambulatory Visit (HOSPITAL_BASED_OUTPATIENT_CLINIC_OR_DEPARTMENT_OTHER): Payer: Self-pay | Admitting: Family Medicine

## 2023-11-18 DIAGNOSIS — F419 Anxiety disorder, unspecified: Secondary | ICD-10-CM

## 2023-11-18 NOTE — Telephone Encounter (Signed)
 Per Delon Hoover, NP Looks like they took the fluid pill as recommended for the 3 lb gain.   If the scale does not move tomorrow, take another demadex .  Left Voice mail at number left on message. Encouraged to call if they have any questions.

## 2023-11-18 NOTE — Telephone Encounter (Signed)
 Pt c/o swelling/edema: STAT if pt has developed SOB within 24 hours  If swelling, where is the swelling located? Ankle/feet   How much weight have you gained and in what time span? 3 pounds since last night   Have you gained 2 pounds in a day or 5 pounds in a week? yes  Do you have a log of your daily weights (if so, list)? N/a  Are you currently taking a fluid pill? Yes took one tablet today   Are you currently SOB? no  Have you traveled recently in a car or plane for an extended period of time? no

## 2023-11-19 ENCOUNTER — Telehealth (HOSPITAL_BASED_OUTPATIENT_CLINIC_OR_DEPARTMENT_OTHER): Payer: Self-pay | Admitting: *Deleted

## 2023-11-19 ENCOUNTER — Other Ambulatory Visit: Payer: Self-pay | Admitting: Cardiology

## 2023-11-19 ENCOUNTER — Ambulatory Visit: Payer: Self-pay | Admitting: Cardiology

## 2023-11-19 DIAGNOSIS — I4819 Other persistent atrial fibrillation: Secondary | ICD-10-CM

## 2023-11-19 NOTE — Telephone Encounter (Signed)
 Tried to contact pt about below and the phone was busy, will route to team to try again.     Dottie Norleen PHEBE PONCE, MD to Me (Selected Message)     11/19/23  5:00 PM So she has been using the Nystatin  powder liberally below each breast and keeping that area dry?  Using for at least 5 days?

## 2023-11-19 NOTE — Telephone Encounter (Signed)
 Copied from CRM #[id number]. Topic: Clinical - Medical Advice >> Nov 19, 2023  1:27 PM Carlatta H wrote: Reason for CRM: Patient switched rash mediation last night and the patient would also like to know if there was a better rash powder that she can use//

## 2023-11-20 ENCOUNTER — Telehealth: Payer: Self-pay | Admitting: Cardiology

## 2023-11-20 NOTE — Telephone Encounter (Signed)
 Patient would like to switch back to Dr. Sheena and requests an appt soon. I advised her that Dr. Ginette schedule is full and if Dr. Sheena approves of the switch then the next available appt would be December or possibly January. She stated that Dr. Sheena would probably make an exception for her and wanted me to ask. Please advise.

## 2023-11-20 NOTE — Telephone Encounter (Signed)
 Pt would like someone to give her a call back about her blood work results   Best number is [phone]

## 2023-11-21 ENCOUNTER — Other Ambulatory Visit (HOSPITAL_BASED_OUTPATIENT_CLINIC_OR_DEPARTMENT_OTHER): Payer: Self-pay | Admitting: Family Medicine

## 2023-11-21 DIAGNOSIS — F419 Anxiety disorder, unspecified: Secondary | ICD-10-CM

## 2023-11-21 NOTE — Telephone Encounter (Unsigned)
 Copied from CRM [id number]. Topic: Clinical - Medication Refill >> Nov 21, 2023  2:45 PM Antwanette L wrote: Medication: ALPRAZolam  (XANAX ) 0.25 MG tablet  Has the patient contacted their pharmacy? Yes  This is the patient's preferred pharmacy:  [facility] [address], [place] - [address] [facility] [place] [address] Phone: [phone] Fax: [phone]  Is this the correct pharmacy for this prescription? Yes   Has the prescription been filled recently? Yes. Dr. Dottie submitted a 15 day supply on 11/04/23  Is the patient out of the medication? Yes  Has the patient been seen for an appointment in the last year OR does the patient have an upcoming appointment? Yes. Last ov w/ Dr. Dottie on 11/04/23  Can we respond through MyChart? No. Contact the patient by phone at [phone]  Agent: Please be advised that Rx refills may take up to 3 business days. We ask that you follow-up with your pharmacy.

## 2023-11-22 ENCOUNTER — Telehealth (HOSPITAL_BASED_OUTPATIENT_CLINIC_OR_DEPARTMENT_OTHER): Payer: Self-pay | Admitting: Family Medicine

## 2023-11-22 ENCOUNTER — Telehealth: Payer: Self-pay | Admitting: Cardiology

## 2023-11-22 NOTE — Telephone Encounter (Unsigned)
 Copied from CRM [id number]. Topic: Clinical - Medication Refill >> Nov 22, 2023  3:27 PM Amy B wrote: Medication:  ALPRAZolam  (XANAX ) 0.25 MG tablet   Has the patient contacted their pharmacy? Yes (Agent: If no, request that the patient contact the pharmacy for the refill. If patient does not wish to contact the pharmacy document the reason why and proceed with request.) (Agent: If yes, when and what did the pharmacy advise?)  This is the patient's preferred pharmacy:  [facility] [address], [place] - [address] [facility] [place] [address] Phone: [phone] Fax: [phone]  [facility] [address] [place] [address] Phone: [phone] Fax: [phone]  [facility] [place], [place] - [address] [place] [address] Phone: [phone] Fax: [phone]  [facility] [address] [place] - [address] [place] [address] Phone: [phone] Fax: [phone]  [id number] [place], [place] - [address] [facility] [place] [address] Phone: [phone] Fax: [phone]  Is this the correct pharmacy for this prescription? Yes If no, delete pharmacy and type the correct one.   Has the prescription been filled recently? No  Is the patient out of the medication? Yes  Has the patient been seen for an appointment in the last year OR does the patient have an upcoming appointment? Yes  Can we respond through MyChart? No  Agent: Please be advised that Rx refills may take up to 3 business days. We ask that you follow-up with your pharmacy.

## 2023-11-22 NOTE — Telephone Encounter (Signed)
 Att x 1- busy.

## 2023-11-22 NOTE — Telephone Encounter (Signed)
 Pt c/o medication issue:  1. Name of Medication: ALPRAZolam  (XANAX ) 0.25 MG tablet   2. How are you currently taking this medication (dosage and times per day)?   Take 1 tablet (0.25 mg total) by mouth at bedtime as needed for anxiety.    3. Are you having a reaction (difficulty breathing--STAT)? No  4. What is your medication issue? Pt's PCP is refusing to refill above medication and pt would like to know if Dr. Revankar will take over filling this prescription. Pt is out of medication and her anxiety is extremely high over the phone.

## 2023-11-22 NOTE — Telephone Encounter (Signed)
 Att to call - Busy

## 2023-11-25 ENCOUNTER — Ambulatory Visit: Payer: Self-pay

## 2023-11-25 NOTE — Telephone Encounter (Signed)
 Patient informed of her lab results. Patient had no further questions at this time.

## 2023-11-25 NOTE — Telephone Encounter (Signed)
 Called the patient and informed her that Dr. Revankar does not prescribe Xanax  on a routine basis. The patient was referred back to her PCP for this type of medication. Patient verbalized understanding and had no further medications at this time.

## 2023-11-25 NOTE — Telephone Encounter (Signed)
 FYI Only or Action Required?: Action required by provider: medication refill request, clinical question for provider, and update on patient condition.  Patient was last seen in primary care on 11/04/2023 by Dottie Norleen PHEBE PONCE, MD.  Called Nurse Triage reporting Medication Problem and Anxiety.  Symptoms began ran out of her Xanax  last Wednesday.  Interventions attempted: Prescription medications: Zoloft .  Symptoms are: anxiety, insomnia, states she had gained about 10 pounds of water weight so she placed herself back on her diuretic on 11/15/23 gradually worsening.  Triage Disposition: Call PCP Now  Patient/caregiver understands and will follow disposition?: Yes           Copied from CRM [id number]. Topic: Clinical - Medication Question >> Nov 25, 2023  8:49 AM Suzen RAMAN wrote: Reason for CRM: Zoloft  has been ineffective patient would like to know if an alternate medication can be taken for Xanax . Reason for Disposition  [1] Caller has URGENT medicine question about med that primary care doctor (or NP/PA) or specialist prescribed AND [2] triager unable to answer question  Answer Assessment - Initial Assessment Questions Patient refusing appointment with PCP. Patient declined appointment with PCP due to him taking her off her diuretic and iron pill. She is requesting appointments with Dr Berneta for transfer of care. Advised that Dr Berneta is not accepting new patients. Patient refusing to be seen by any providers other than Dr Berneta. Advised ED or urgent care in the mean time and patient refused. She continues to state she is not happy with her care with Dr Dottie and would like to go back to seeing Dr Berneta. She is asking for a refill of her Xanax  and for a list of higher sodium foods she should be eating.    1. NAME of MEDICINE: What medicine(s) are you calling about?     Zoloft  and Xanax .  2. QUESTION: What is your question? (e.g., double dose of medicine, side effect)      Patient is wondering what she can take for anxiety. She states she has been taking Zoloft  (25 mg) for 8 days and she feels it is not effective. Advised patient medication may need more time to be effective. She states she ran out of her Xanax  on Wednesday last week. She states she has been taking more than was prescribed she states she was taking it twice daily instead because she didn't know the directions.  3. PRESCRIBER: Who prescribed the medicine? Reason: if prescribed by specialist, call should be referred to that group.     Dr Dottie prescribes Xanax . Dr Berneta, prescribed the Zoloft .  4. SYMPTOMS: Do you have any symptoms? If Yes, ask: What symptoms are you having?  How bad are the symptoms (e.g., mild, moderate, severe)     Insomnia, anxiety. She states about 2 weeks ago PCP took her off her water pill due to low sodium but she gained fluid/water weight and put herself back on her diuretic on 11/15/23.  5. PREGNANCY:  Is there any chance that you are pregnant? When was your last menstrual period?     N/A.  Protocols used: Medication Question Call-A-AH

## 2023-11-26 ENCOUNTER — Telehealth: Payer: Self-pay | Admitting: Cardiology

## 2023-11-26 DIAGNOSIS — I4819 Other persistent atrial fibrillation: Secondary | ICD-10-CM

## 2023-11-26 MED ORDER — APIXABAN 5 MG PO TABS: 5.0000 mg | ORAL_TABLET | Freq: Two times a day (BID) | ORAL | 1 refills | Status: AC

## 2023-11-26 NOTE — Telephone Encounter (Addendum)
 Prescription refill request for Eliquis  received. Indication:  A-Fib Last office visit: 09/19/2023 Scr: 1.03   last lab 11/04/2023 Age: 89 yrs.  Weight: 95.3 kg  Pt meets protocol and pt's medication was sent to pt's pharmacy

## 2023-11-26 NOTE — Telephone Encounter (Signed)
*  STAT* If patient is at the pharmacy, call can be transferred to refill team.   1. Which medications need to be refilled? (please list name of each medication and dose if known) apixaban  (ELIQUIS ) 5 MG TABS tablet    2. Would you like to learn more about the convenience, safety, & potential cost savings by using the [facility]?     3. Are you open to using the [facility] (Type [facility].  ).   4. Which pharmacy/location (including street and city if local pharmacy) is medication to be sent to? [id number] - [place], [place] - [address]    5. Do they need a 30 day or 90 day supply? 90 day

## 2023-11-28 ENCOUNTER — Ambulatory Visit: Payer: Self-pay

## 2023-11-28 ENCOUNTER — Other Ambulatory Visit (HOSPITAL_BASED_OUTPATIENT_CLINIC_OR_DEPARTMENT_OTHER): Payer: Self-pay | Admitting: Family Medicine

## 2023-11-28 DIAGNOSIS — F419 Anxiety disorder, unspecified: Secondary | ICD-10-CM

## 2023-11-28 MED ORDER — ALPRAZOLAM 0.25 MG PO TABS: 0.2500 mg | ORAL_TABLET | Freq: Every evening | ORAL | 0 refills | Status: AC | PRN

## 2023-11-28 NOTE — Telephone Encounter (Signed)
 FYI Only or Action Required?: Action required by provider: medication refill request.  Patient was last seen in primary care on 11/04/2023 by Dottie Norleen PHEBE PONCE, MD.  Called Nurse Triage reporting Medication Problem.  Symptoms began several days ago.  Interventions attempted: Nothing.  Symptoms are: stable. Pt. Reports she didn't realize she was supposed to cut back on Xanax  to 1 tablet a day. Has run out of medication. Asking for a refill.  Triage Disposition: Call PCP When Office is Open  Patient/caregiver understands and will follow disposition?: Yes   Copied from CRM [id number]. Topic: Clinical - Red Word Triage >> Nov 28, 2023  8:37 AM Carlatta H wrote: [facility] that prompted transfer to Nurse Triage: Patient is having cramps, unable to eat or sleep after being taken off of ALPRAZolam  (XANAX ) 0.25 MG tablet [[id number]] Reason for Disposition  [1] Prescription refill request for NON-ESSENTIAL medicine (i.e., no harm to patient if med not taken) AND [2] triager unable to refill per department policy  Protocols used: Medication Refill and Renewal Call-A-AH

## 2023-12-06 DIAGNOSIS — S91301A Unspecified open wound, right foot, initial encounter: Secondary | ICD-10-CM | POA: Diagnosis not present

## 2023-12-09 ENCOUNTER — Ambulatory Visit: Admitting: Family Medicine

## 2023-12-11 ENCOUNTER — Encounter (HOSPITAL_BASED_OUTPATIENT_CLINIC_OR_DEPARTMENT_OTHER): Payer: Self-pay | Admitting: Student

## 2023-12-11 ENCOUNTER — Ambulatory Visit: Payer: Self-pay

## 2023-12-11 ENCOUNTER — Ambulatory Visit (INDEPENDENT_AMBULATORY_CARE_PROVIDER_SITE_OTHER): Admitting: Student

## 2023-12-11 VITALS — BP 113/74 | HR 83 | Temp 98.4°F | Resp 16 | Ht 65.0 in | Wt 206.0 lb

## 2023-12-11 DIAGNOSIS — L03115 Cellulitis of right lower limb: Secondary | ICD-10-CM

## 2023-12-11 NOTE — Progress Notes (Signed)
 Acute Office Visit  Subjective:     Patient ID: Kaitlyn Rodriguez, female    DOB: 05/03/1933, 89 y.o.   MRN: [id number]  Chief Complaint  Patient presents with   Sore    Sore on right foot for 1 week. Went to [facility] and was prescribed antibiotics (kelfex) and mupiriocin but has not taken the antibiotics. They were too big. Mupirocin hurt foot.    Allergies    Was taking claritin  but nose dried so much now concerns about runny nose.     HPI  Discussed the use of AI scribe software for clinical note transcription with the patient, who gave verbal consent to proceed.  History of Present Illness   Kaitlyn Rodriguez is an 89 year old female with aortic replacement who presents with foot swelling and concerns about fluid retention.  She has significant swelling in her feet, described as a 'little red thing' causing much trouble. Her feet were previously well-managed with diuretics, but after discontinuing them for eleven days, her feet began to swell significantly. Although her legs have reduced in swelling, her feet remain swollen despite resuming the diuretics. She has been taking multiple diuretics, including 'little blue ones,' but with limited success.  She has a history of an aortic replacement surgery five years ago and has experienced fluid retention issues since then. Her sodium levels were noted to be low, with a reading of 123 on September 8th, which improved to 128 by September 17th. She does not like to add salt to her food, which may contribute to her low sodium levels.  She visited urgent care where she was prescribed Keflex for a foot infection, but she has difficulty taking large pills and is concerned about potential side effects. She has not observed any pus but describes the area as red and is worried about the infection spreading. She is cautious about using peroxide for cleaning the area and prefers soap and water or saline.  She experiences difficulty with mobility  due to the swelling and lacks appropriate footwear. She does not experience major pain or loss of sensation in her feet. She uses a pedaling machine for exercise, which causes tingling in her feet.  She has a history of allergies, with a dry nose and throat, which worsens with Claritin  use. She uses nasal saline spray nightly.      ROS Per HPI     Objective:    BP 113/74   Pulse 83   Temp 98.4 F (36.9 C) (Oral)   Resp 16   Ht 5' 5 (1.651 m)   Wt 206 lb (93.4 kg)   SpO2 91%   BMI 34.28 kg/m  BP Readings from Last 3 Encounters:  12/11/23 113/74  11/04/23 108/75  10/07/23 104/68   Wt Readings from Last 3 Encounters:  12/11/23 206 lb (93.4 kg)  11/04/23 210 lb (95.3 kg)  10/07/23 211 lb (95.7 kg)      Physical Exam Constitutional:      General: She is not in acute distress.    Appearance: Normal appearance. She is not ill-appearing, toxic-appearing or diaphoretic.     Comments: In wheelchair  HENT:     Head: Normocephalic and atraumatic.     Nose: Nose normal.  Eyes:     General: No scleral icterus.    Conjunctiva/sclera: Conjunctivae normal.  Cardiovascular:     Rate and Rhythm: Normal rate and regular rhythm.     Heart sounds: Normal heart sounds. No  murmur heard.    No friction rub.  Pulmonary:     Effort: Pulmonary effort is normal. No respiratory distress.     Breath sounds: Normal breath sounds. No wheezing, rhonchi or rales.  Musculoskeletal:        General: Normal range of motion.     Right lower leg: Edema (3+ pitting) present.     Left lower leg: Edema (3+ pitting) present.  Skin:    General: Skin is warm and dry.     Coloration: Skin is not jaundiced or pale.     Findings: Erythema (erythema and swelling with two focal lesions on left foot) present.  Neurological:     General: No focal deficit present.     Mental Status: She is alert.  Psychiatric:        Mood and Affect: Mood normal.        Behavior: Behavior normal.     No results found  for any visits on 12/11/23.      Assessment & Plan:   Assessment and Plan    Cellulitis of left foot with chronic lower extremity edema and venous insufficiency Chronic lower extremity edema with venous insufficiency, complicated by cellulitis of the left foot. Significant swelling 3+, infection may be secondary to stasis dermatitis. Area appears red and inflamed. Fluid retention issues post-aortic surgery. Current management includes diuretics as per cardiology. - Prescribe Keflex and instruct to take with food to minimize gastrointestinal upset. - Advise cleaning the affected area with soap and water or saline solution. - Instruct to keep the foot elevated to manage swelling. - Schedule follow-up appointment in 2-3 weeks to reassess the condition.  Allergic rhinitis Chronic nasal congestion and dryness. - Use nasal saline spray multiple times daily for nasal congestion and dryness.      I personally spent a total of 26 minutes in the care of the patient today including preparing to see the patient, getting/reviewing separately obtained history, performing a medically appropriate exam/evaluation, counseling and educating, placing orders, and documenting clinical information in the EHR.   Return in about 2 weeks (around 12/25/2023) for R foot wound.  Averi Kilty T Moon Budde, PA-C

## 2023-12-11 NOTE — Patient Instructions (Addendum)
 It was nice to see you today!  As we discussed in clinic:  Please make sure to take the keflex as directed WITH FOOD. Please also do not use the mupirocin ointment for your foot as you may be slightly allergic. I would encourage you to follow up with cardiology for your swelling.   If you have any problems before your next visit feel free to message me via MyChart (minor issues or questions) or call the office, otherwise you may reach out to schedule an office visit.  Thank you! Braelynn Benning, PA-C

## 2023-12-11 NOTE — Telephone Encounter (Signed)
 FYI Only or Action Required?: Action required by provider: request for appointment.  Patient was last seen in primary care on 11/04/2023 by Dottie Norleen PHEBE PONCE, MD.  Called Nurse Triage reporting Foot Pain.  Symptoms began several weeks ago.  Interventions attempted: Prescription medications: abx cream .  Symptoms are: gradually worsening.  Triage Disposition: See HCP Within 4 Hours (Or PCP Triage)  Patient/caregiver understands and will follow disposition?: YesCopied from CRM #[id number]. Topic: Clinical - Red Word Triage >> Dec 11, 2023 11:17 AM Donna BRAVO wrote: Red Word that prompted transfer to Nurse Triage:  Talking with patient and son Madeleine, call dropped. Calling patient back.  patient and son Madeleine speaking with me, A week ago, on the right side of her foot had a bump show up over night, size of a quarter,went to urgent care 12/06/23 was given mutirocin 22 grams cream 2% ointment. This cream causes severe stinging along with pain, now it's red, not weeping or oozing, a little pain when foot propped up, very painful when standing, Reason for Disposition  [1] SEVERE pain (e.g., excruciating, unable to do any normal activities) AND [2] not improved after 2 hours of pain medicine  Answer Assessment - Initial Assessment Questions Sore on foot- has turned red. Woke up with red spot several weeks ago. Pt was [facility] at [facility] for same concern but made it worse. CAL able to schedule appt for today at 1550.      1. ONSET: When did the pain start?      Right foot  2. LOCATION: Where is the pain located?      Side/top 3. PAIN: How bad is the pain?    (Scale 1-10; or mild, moderate, severe)     mild 4. WORK OR EXERCISE: Has there been any recent work or exercise that involved this part of the body?      na 5. CAUSE: What do you think is causing the foot pain?     wound 6. OTHER SYMPTOMS: Do you have any other symptoms? (e.g., leg pain, rash, fever, numbness) denies  Protocols  used: Foot Pain-A-AH

## 2023-12-23 ENCOUNTER — Telehealth (HOSPITAL_BASED_OUTPATIENT_CLINIC_OR_DEPARTMENT_OTHER): Payer: Self-pay | Admitting: *Deleted

## 2023-12-23 NOTE — Telephone Encounter (Signed)
 Copied from CRM [id number]. Topic: Clinical - Medication Question >> Dec 20, 2023 12:38 PM [name] T wrote: Reason for CRM: Patient calling, requesting to speak directly to nurse in office.  Per patient she would like to speak to nurse regarding medication, ALPRAZolam  (XANAX ) 0.25 MG tablet.  States she has not been sleeping. No further information given.  Patient can be reached at [phone], patient is aware of same day call back.

## 2023-12-23 NOTE — Telephone Encounter (Signed)
 Attempted multiple calls and the phone was busy.  Will attempt at a later time to inquire about below.

## 2023-12-25 ENCOUNTER — Other Ambulatory Visit: Payer: Self-pay | Admitting: Family Medicine

## 2023-12-25 DIAGNOSIS — E039 Hypothyroidism, unspecified: Secondary | ICD-10-CM

## 2023-12-27 ENCOUNTER — Ambulatory Visit (INDEPENDENT_AMBULATORY_CARE_PROVIDER_SITE_OTHER): Admitting: Family Medicine

## 2023-12-27 ENCOUNTER — Encounter (HOSPITAL_BASED_OUTPATIENT_CLINIC_OR_DEPARTMENT_OTHER): Payer: Self-pay | Admitting: Family Medicine

## 2023-12-27 VITALS — BP 118/85 | HR 80 | Temp 98.7°F | Resp 16 | Wt 204.0 lb

## 2023-12-27 DIAGNOSIS — L0390 Cellulitis, unspecified: Secondary | ICD-10-CM | POA: Insufficient documentation

## 2023-12-27 DIAGNOSIS — I10 Essential (primary) hypertension: Secondary | ICD-10-CM | POA: Diagnosis not present

## 2023-12-27 DIAGNOSIS — L03119 Cellulitis of unspecified part of limb: Secondary | ICD-10-CM | POA: Diagnosis not present

## 2023-12-27 DIAGNOSIS — F419 Anxiety disorder, unspecified: Secondary | ICD-10-CM

## 2023-12-27 DIAGNOSIS — E871 Hypo-osmolality and hyponatremia: Secondary | ICD-10-CM | POA: Diagnosis not present

## 2023-12-27 DIAGNOSIS — I739 Peripheral vascular disease, unspecified: Secondary | ICD-10-CM

## 2023-12-27 MED ORDER — CLINDAMYCIN HCL 300 MG PO CAPS: 300.0000 mg | ORAL_CAPSULE | Freq: Three times a day (TID) | ORAL | 0 refills | Status: AC

## 2023-12-27 NOTE — Progress Notes (Signed)
 Established Patient Office Visit  Subjective   Patient ID: Kaitlyn Rodriguez, female    DOB: 04/05/1933  Age: 89 y.o. MRN: [id number]  Chief Complaint  Patient presents with   Follow-up    Follow-up    F/u as above.  Please see previous notes regarding concerns about her regular Xanax  usage and her agreement to see a local Psychiatry provider.  Please see recent note regarding the cellulitis in her feet.  Just completed a course of Cephalexin, which unfortunately doesn't seem to have helped much.  I'm pleased to hear that she is now taking Sertraline  for her anxiety and is less belligerent about the Xanax .    Past Medical History:  Diagnosis Date   Anxiety 01/01/2022   f/by Psychiatry at [facility]   Benign essential hypertension    CKD (chronic kidney disease) stage 3, GFR 30-59 ml/min (HCC)    FTT (failure to thrive) in adult    Declines Assisted Living or [facility] nurse   Heart failure, unspecified (HCC)    Hypothyroidism 89/74/7979   LSC (lichen simplex chronicus) 01/01/2022   Mitral regurgitation    Obesity (BMI 30-39.9) 07/27/2020   Persistent atrial fibrillation (HCC)    f/by Dr. 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   Unspecified osteoarthritis, unspecified site    Vitamin B12 deficiency anemia     Outpatient Encounter Medications as of 12/27/2023  Medication Sig   ALPRAZolam  (XANAX ) 0.25 MG tablet Take 1 tablet (0.25 mg total) by mouth at bedtime as needed for anxiety (Sedation precautions and avoid alcohol).   apixaban  (ELIQUIS ) 5 MG TABS tablet Take 1 tablet (5 mg total) by mouth 2 (two) times daily.   cephALEXin (KEFLEX) 500 MG capsule Take 500 mg by mouth 4 (four) times daily.   clindamycin (CLEOCIN) 300 MG capsule Take 1 capsule (300 mg total) by mouth 3 (three) times daily.   cyanocobalamin  (VITAMIN B12) 1000 MCG/ML injection INJECT 1ML INTO THE MUSCLE EVERY 30 DAYS   metolazone  (ZAROXOLYN ) 5  MG tablet Take 1 tablet (5 mg total) by mouth once a week.   metoprolol  succinate (TOPROL -XL) 50 MG 24 hr tablet Take 0.5 tablets (25 mg total) by mouth 2 (two) times daily.   Multiple Vitamin (MULTIVITAMIN WITH MINERALS) TABS tablet Take 1 tablet by mouth daily.   nystatin  powder Apply topically 2 (two) times daily. Use beneath breasts prn   Respiratory Therapy Supplies (IN-CHECK INSPIRATORY FLOW MTR) DEVI Use as directed.   sertraline  (ZOLOFT ) 25 MG tablet Take 25 mg by mouth daily.   SYNTHROID  125 MCG tablet TAKE 1 TABLET BY MOUTH ONCE DAILY IN THE MORNING APPOINTMENT REQUIRED FOR FUTURE REFILLS   ferrous sulfate  (FEROSUL) 325 (65 FE) MG tablet Take 1 tablet (325 mg total) by mouth daily with breakfast. (Patient not taking: Reported on 12/27/2023)   loratadine  (CLARITIN ) 10 MG tablet Take 1 tablet (10 mg total) by mouth daily. (Patient not taking: Reported on 12/11/2023)   mupirocin ointment (BACTROBAN) 2 % Apply 1 Application topically as directed. 2-3 times a day (Patient not taking: Reported on 12/27/2023)   torsemide  (DEMADEX ) 20 MG tablet TAKE 1 TABLET BY MOUTH ONCE DAILY AND TAKE 1 TABLET EVERY OTHER NIGHT (Patient not taking: Reported on 12/27/2023)   triamcinolone  ointment (KENALOG ) 0.5 % Apply 1 Application topically 2 (two) times daily. (Patient not taking: Reported on 12/11/2023)   No facility-administered encounter medications on file as of 12/27/2023.    Social  History   Tobacco Use   Smoking status: Never    Passive exposure: Never   Smokeless tobacco: Never  Vaping Use   Vaping status: Never Used  Substance Use Topics   Alcohol use: Never   Drug use: Never      Review of Systems  Constitutional:  Negative for diaphoresis, fever, malaise/fatigue and weight loss.  Respiratory:  Negative for cough, shortness of breath and wheezing.   Cardiovascular:  Positive for leg swelling. Negative for chest pain, palpitations, orthopnea, claudication and PND.      Objective:      BP 118/85 (Cuff Size: Large)   Pulse 80   Temp 98.7 F (37.1 C) (Oral)   Resp 16   Wt 204 lb (92.5 kg)   SpO2 (!) 88%   BMI 33.95 kg/m    Physical Exam Constitutional:      General: She is not in acute distress.    Appearance: Normal appearance. She is obese.     Comments: Comfortable in wheelchair.  HENT:     Head: Normocephalic.  Neck:     Vascular: No carotid bruit.  Cardiovascular:     Rate and Rhythm: Normal rate and regular rhythm.     Pulses: Normal pulses.     Heart sounds: Normal heart sounds.  Pulmonary:     Effort: Pulmonary effort is normal.     Breath sounds: Normal breath sounds.  Abdominal:     General: Bowel sounds are normal.     Palpations: Abdomen is soft.  Musculoskeletal:     Cervical back: Neck supple. No tenderness.     Right lower leg: Edema present.     Left lower leg: Edema present.     Comments: Chronic bilateral 1+ edema.  Trace - 1+ pedal pulses.  Feet with minor diffuse discomfort.  Skin:    Comments: Moderate cellulitis along both feet.  Neurological:     Mental Status: She is alert.      Results for orders placed or performed in visit on 12/27/23  Basic metabolic panel with GFR  Result Value Ref Range   Glucose 90 70 - 99 mg/dL   BUN 18 8 - 27 mg/dL   Creatinine, Ser 8.91 (H) 0.57 - 1.00 mg/dL   eGFR 49 (L) >40 fO/fpw/8.26   BUN/Creatinine Ratio 17 12 - 28   Sodium 130 (L) 134 - 144 mmol/L   Potassium 4.0 3.5 - 5.2 mmol/L   Chloride 88 (L) 96 - 106 mmol/L   CO2 29 20 - 29 mmol/L   Calcium  9.2 8.7 - 10.3 mg/dL      The ASCVD Risk score (Arnett DK, et al., 2019) failed to calculate for the following reasons:   The 2019 ASCVD risk score is only valid for ages 23 to 60    Assessment & Plan:  Hyponatremia Assessment & Plan: Cautioned to avoid excessive water consumption.  Await lab and f/u with her soon.  Orders: -     Basic metabolic panel with GFR  Anxiety Assessment & Plan: Improved.  F/u with Psychiatry as  directed.  I'm pleased she became more agreeable to an SSRI.   Benign essential hypertension Assessment & Plan: Satisfactory control.   Cellulitis of lower extremity, unspecified laterality Assessment & Plan: Worrisome exam.  Hygiene reviewed.  Soap and water cleansing at least twice daily.  Probiotics.  If she runs a fever or starts feeling worse, she is to proceed to the ER.   High priority referral to  Wound care center and will need Vascular workup as well.    Orders: -     Clindamycin HCl; Take 1 capsule (300 mg total) by mouth 3 (three) times daily.  Dispense: 21 capsule; Refill: 0 -     Ambulatory referral to Wound Clinic  PAD (peripheral artery disease) -     Ambulatory referral to Vascular Surgery    Return in about 2 weeks (around 01/10/2024) for chronic follow-up.    REDDING PONCE NORLEEN FALCON., MD

## 2023-12-28 LAB — BASIC METABOLIC PANEL WITH GFR
BUN/Creatinine Ratio: 17 (ref 12–28)
BUN: 18 mg/dL (ref 8–27)
CO2: 29 mmol/L (ref 20–29)
Calcium: 9.2 mg/dL (ref 8.7–10.3)
Chloride: 88 mmol/L — ABNORMAL LOW (ref 96–106)
Creatinine, Ser: 1.08 mg/dL — ABNORMAL HIGH (ref 0.57–1.00)
Glucose: 90 mg/dL (ref 70–99)
Potassium: 4 mmol/L (ref 3.5–5.2)
Sodium: 130 mmol/L — ABNORMAL LOW (ref 134–144)
eGFR: 49 mL/min/1.73 — ABNORMAL LOW (ref >59)

## 2023-12-30 ENCOUNTER — Ambulatory Visit (HOSPITAL_BASED_OUTPATIENT_CLINIC_OR_DEPARTMENT_OTHER): Payer: Self-pay | Admitting: Family Medicine

## 2023-12-30 ENCOUNTER — Telehealth (HOSPITAL_BASED_OUTPATIENT_CLINIC_OR_DEPARTMENT_OTHER): Payer: Self-pay | Admitting: *Deleted

## 2023-12-30 ENCOUNTER — Telehealth: Payer: Self-pay | Admitting: Family Medicine

## 2023-12-30 NOTE — Assessment & Plan Note (Signed)
Satisfactory control. 

## 2023-12-30 NOTE — Telephone Encounter (Signed)
 Spoke with pt and son to inform them of the appt scheduled with [facility]. Son and pt state appt at [facility] will not work as he will not drive that far due to possibly getting lost. They have requested somewhere closer. I have sent referral to [facility] in [place].    Son and Pt requested blood work results. I informed them a CMA or someone should be reaching out to them in regards to the test results

## 2023-12-30 NOTE — Assessment & Plan Note (Signed)
 Worrisome exam.  Hygiene reviewed.  Soap and water cleansing at least twice daily.  Probiotics.  If she runs a fever or starts feeling worse, she is to proceed to the ER.   High priority referral to Wound care center and will need Vascular workup as well.

## 2023-12-30 NOTE — Telephone Encounter (Signed)
 Pt made aware.

## 2023-12-30 NOTE — Assessment & Plan Note (Addendum)
 Improved.  F/u with Psychiatry as directed.  I'm pleased she became more agreeable to an SSRI.

## 2023-12-30 NOTE — Assessment & Plan Note (Signed)
 Cautioned to avoid excessive water consumption.  Await lab and f/u with her soon.

## 2024-01-01 ENCOUNTER — Encounter (INDEPENDENT_AMBULATORY_CARE_PROVIDER_SITE_OTHER)

## 2024-01-01 ENCOUNTER — Encounter (INDEPENDENT_AMBULATORY_CARE_PROVIDER_SITE_OTHER): Admitting: Nurse Practitioner

## 2024-01-04 ENCOUNTER — Other Ambulatory Visit (HOSPITAL_BASED_OUTPATIENT_CLINIC_OR_DEPARTMENT_OTHER): Payer: Self-pay | Admitting: Family Medicine

## 2024-01-04 DIAGNOSIS — B379 Candidiasis, unspecified: Secondary | ICD-10-CM

## 2024-01-09 ENCOUNTER — Other Ambulatory Visit (HOSPITAL_BASED_OUTPATIENT_CLINIC_OR_DEPARTMENT_OTHER): Payer: Self-pay | Admitting: Family Medicine

## 2024-01-09 DIAGNOSIS — E039 Hypothyroidism, unspecified: Secondary | ICD-10-CM

## 2024-01-09 NOTE — Telephone Encounter (Unsigned)
 Copied from CRM [id number]. Topic: Clinical - Medication Refill >> Jan 09, 2024 10:23 AM Darshell M wrote: Medication:  SYNTHROID  125 MCG tablet  Has the patient contacted their pharmacy? Yes (Agent: If no, request that the patient contact the pharmacy for the refill. If patient does not wish to contact the pharmacy document the reason why and proceed with request.) (Agent: If yes, when and what did the pharmacy advise?)  This is the patient's preferred pharmacy:  [facility] [address] [place] - [address] [facility] [place] [address] Phone: [phone] Fax: [phone]    Is this the correct pharmacy for this prescription? Yes If no, delete pharmacy and type the correct one.   Has the prescription been filled recently? No  Is the patient out of the medication? No  Has the patient been seen for an appointment in the last year OR does the patient have an upcoming appointment? Yes  Can we respond through MyChart? No  Agent: Please be advised that Rx refills may take up to 3 business days. We ask that you follow-up with your pharmacy.

## 2024-01-10 ENCOUNTER — Ambulatory Visit (HOSPITAL_BASED_OUTPATIENT_CLINIC_OR_DEPARTMENT_OTHER): Admitting: Family Medicine

## 2024-01-10 MED ORDER — SYNTHROID 125 MCG PO TABS: 125.0000 ug | ORAL_TABLET | Freq: Every day | ORAL | 2 refills | Status: AC

## 2024-01-15 NOTE — Telephone Encounter (Addendum)
 Patient has had repeat blood work and followed up with PCP since her labs have resulted.  Alan, RN

## 2024-01-31 ENCOUNTER — Telehealth (HOSPITAL_BASED_OUTPATIENT_CLINIC_OR_DEPARTMENT_OTHER): Payer: Self-pay | Admitting: Family Medicine

## 2024-01-31 ENCOUNTER — Other Ambulatory Visit (HOSPITAL_BASED_OUTPATIENT_CLINIC_OR_DEPARTMENT_OTHER): Payer: Self-pay | Admitting: Family Medicine

## 2024-01-31 ENCOUNTER — Telehealth: Payer: Self-pay

## 2024-01-31 DIAGNOSIS — B379 Candidiasis, unspecified: Secondary | ICD-10-CM

## 2024-01-31 NOTE — Telephone Encounter (Unsigned)
 Copied from CRM #[id number]. Topic: Clinical - Refused Triage >> Jan 31, 2024  4:10 PM Antwanette L wrote: Debby (Tommy), the patient's son, is calling because the patient has a blister on the side of her foot and cannot wear shoes. They are in contact with the wound care center. I asked Tommy if he needed to speak with a nurse, and he declined.

## 2024-01-31 NOTE — Telephone Encounter (Signed)
 Copied from CRM [id number]. Topic: Clinical - Medication Question >> Jan 31, 2024  4:11 PM Antwanette L wrote: Reason for CRM: The patient developed dry mouth while in the hospital and now has sores. She declined to speak with nurse triage. She wants to know if Dr. Dottie is familiar with 'miracle mouthwash. Patient is requesting a callback at [phone]

## 2024-01-31 NOTE — Telephone Encounter (Signed)
 Copied from CRM [id number]. Topic: Clinical - Medication Refill >> Jan 31, 2024  4:03 PM Antwanette L wrote: Medication: nystatin  powder  Has the patient contacted their pharmacy? Yes.  This is the patient's preferred pharmacy:  [facility] [address], [place] - [address] [facility] [place] [address] Phone: [phone] Fax: [phone]   Is this the correct pharmacy for this prescription? Yes   Has the prescription been filled recently? Yes. Last refill was on 11/05/23  Is the patient out of the medication? Yes  Has the patient been seen for an appointment in the last year OR does the patient have an upcoming appointment? Yes. Last ov with Dr. Dottie was on 12/27/23   Can we respond through MyChart? No. Contact the patient by phone at [phone] or [phone]  Agent: Please be advised that Rx refills may take up to 3 business days. We ask that you follow-up with your pharmacy.

## 2024-02-03 ENCOUNTER — Other Ambulatory Visit (HOSPITAL_BASED_OUTPATIENT_CLINIC_OR_DEPARTMENT_OTHER): Payer: Self-pay | Admitting: *Deleted

## 2024-02-03 ENCOUNTER — Other Ambulatory Visit (HOSPITAL_BASED_OUTPATIENT_CLINIC_OR_DEPARTMENT_OTHER): Payer: Self-pay | Admitting: Family Medicine

## 2024-02-03 ENCOUNTER — Ambulatory Visit (HOSPITAL_BASED_OUTPATIENT_CLINIC_OR_DEPARTMENT_OTHER): Admitting: Family Medicine

## 2024-02-03 ENCOUNTER — Other Ambulatory Visit (HOSPITAL_BASED_OUTPATIENT_CLINIC_OR_DEPARTMENT_OTHER): Payer: Self-pay

## 2024-02-03 DIAGNOSIS — K121 Other forms of stomatitis: Secondary | ICD-10-CM

## 2024-02-03 DIAGNOSIS — B379 Candidiasis, unspecified: Secondary | ICD-10-CM

## 2024-02-03 MED ORDER — NYSTATIN 100000 UNIT/GM EX POWD: Freq: Two times a day (BID) | CUTANEOUS | 2 refills | Status: AC

## 2024-02-03 MED ORDER — NYSTATIN 100000 UNIT/ML MT SUSP
5.0000 mL | Freq: Four times a day (QID) | OROMUCOSAL | 1 refills | Status: AC | PRN
  Filled 2024-02-03: qty 100, 5d supply, fill #0

## 2024-02-03 MED ORDER — MAGIC MOUTHWASH: 5.0000 mL | Freq: Four times a day (QID) | ORAL | 1 refills | Status: DC | PRN

## 2024-02-04 ENCOUNTER — Other Ambulatory Visit (HOSPITAL_BASED_OUTPATIENT_CLINIC_OR_DEPARTMENT_OTHER): Payer: Self-pay

## 2024-02-06 ENCOUNTER — Other Ambulatory Visit (HOSPITAL_BASED_OUTPATIENT_CLINIC_OR_DEPARTMENT_OTHER): Payer: Self-pay

## 2024-02-06 ENCOUNTER — Other Ambulatory Visit (HOSPITAL_BASED_OUTPATIENT_CLINIC_OR_DEPARTMENT_OTHER): Payer: Self-pay | Admitting: Family Medicine

## 2024-02-06 ENCOUNTER — Telehealth (HOSPITAL_BASED_OUTPATIENT_CLINIC_OR_DEPARTMENT_OTHER): Payer: Self-pay | Admitting: *Deleted

## 2024-02-06 DIAGNOSIS — L0390 Cellulitis, unspecified: Secondary | ICD-10-CM

## 2024-02-06 NOTE — Telephone Encounter (Signed)
 Copied from CRM #[id number]. Topic: General - Other >> Feb 06, 2024  4:54 PM Joesph NOVAK wrote: Reason for CRM: Charity from the wound care center is calling because patient has a new wound and they discharged her so no longer will see her. She wants to know if dr redding will send a referral for home health. Please advise. If you have any questions, call charity [phone]. [phone].

## 2024-02-12 ENCOUNTER — Other Ambulatory Visit: Payer: Self-pay | Admitting: Family Medicine

## 2024-02-12 DIAGNOSIS — D519 Vitamin B12 deficiency anemia, unspecified: Secondary | ICD-10-CM

## 2024-02-13 NOTE — Telephone Encounter (Signed)
 Pt is now seeing Dr. Norleen Fearing II at [facility] for primary care. Med RF request declined.

## 2024-02-14 ENCOUNTER — Other Ambulatory Visit (HOSPITAL_BASED_OUTPATIENT_CLINIC_OR_DEPARTMENT_OTHER): Payer: Self-pay | Admitting: Family Medicine

## 2024-02-14 DIAGNOSIS — L0390 Cellulitis, unspecified: Secondary | ICD-10-CM

## 2024-03-30 ENCOUNTER — Telehealth: Payer: Self-pay | Admitting: Cardiology

## 2024-03-30 NOTE — Telephone Encounter (Signed)
 Spoke with patient and son by phone. He reports the patient has two large blisters on her right foot related to swelling. Appt to establish care for tomorrow was canceled due to weather, as the patient has difficulty walking and the driveway was covered in ice and snow.  Swelling only in BLE. Son reports the patient takes metolazone  5 mg about once weekly when swelling worsens, and took torsemide  20 mg this morning (usually takes 20 mg once or twice daily depending on swelling). He wants to ensure they are managing her fluid medications and leg swelling appropriately.  Advised son to contact the patient's PCP regarding the leg blisters . Assisted son and patient in setting up MyChart so he can send photos of the swelling.  Son states the patient has baseline shortness of breath with exertion, unchanged from usual. Message will be forwarded to Dr. Sheena for review.

## 2024-03-31 ENCOUNTER — Ambulatory Visit: Admitting: Cardiology

## 2024-04-24 ENCOUNTER — Ambulatory Visit: Payer: Medicare Other

## 2024-05-15 ENCOUNTER — Ambulatory Visit: Admitting: Emergency Medicine
